# Patient Record
Sex: Female | Born: 1965
Health system: Southern US, Community
[De-identification: ages and names within clinical notes are randomized; demographics above are authoritative.]

## PROBLEM LIST (undated history)

## (undated) ENCOUNTER — Inpatient Hospital Stay: Admission: EM | Payer: Self-pay | Source: Home / Self Care

## (undated) DIAGNOSIS — M5417 Radiculopathy, lumbosacral region: Secondary | ICD-10-CM

## (undated) DIAGNOSIS — C539 Malignant neoplasm of cervix uteri, unspecified: Secondary | ICD-10-CM

## (undated) DIAGNOSIS — G8929 Other chronic pain: Secondary | ICD-10-CM

## (undated) DIAGNOSIS — M5412 Radiculopathy, cervical region: Secondary | ICD-10-CM

## (undated) DIAGNOSIS — F32A Depression, unspecified: Secondary | ICD-10-CM

## (undated) DIAGNOSIS — Z8672 Personal history of thrombophlebitis: Secondary | ICD-10-CM

## (undated) DIAGNOSIS — F419 Anxiety disorder, unspecified: Secondary | ICD-10-CM

## (undated) DIAGNOSIS — I748 Embolism and thrombosis of other arteries: Secondary | ICD-10-CM

## (undated) DIAGNOSIS — G479 Sleep disorder, unspecified: Secondary | ICD-10-CM

## (undated) DIAGNOSIS — I82409 Acute embolism and thrombosis of unspecified deep veins of unspecified lower extremity: Secondary | ICD-10-CM

## (undated) DIAGNOSIS — F329 Major depressive disorder, single episode, unspecified: Secondary | ICD-10-CM

## (undated) DIAGNOSIS — D735 Infarction of spleen: Secondary | ICD-10-CM

## (undated) DIAGNOSIS — IMO0001 Reserved for inherently not codable concepts without codable children: Secondary | ICD-10-CM

## (undated) DIAGNOSIS — R030 Elevated blood-pressure reading, without diagnosis of hypertension: Secondary | ICD-10-CM

## (undated) DIAGNOSIS — M542 Cervicalgia: Secondary | ICD-10-CM

## (undated) HISTORY — DX: Personal history of thrombophlebitis: Z86.72

## (undated) HISTORY — DX: Anxiety disorder, unspecified: F41.9

## (undated) HISTORY — DX: Infarction of spleen: D73.5

## (undated) HISTORY — DX: Sleep disorder, unspecified: G47.9

## (undated) HISTORY — DX: Radiculopathy, lumbosacral region: M54.17

## (undated) HISTORY — DX: Cervicalgia: M54.2

## (undated) HISTORY — DX: Major depressive disorder, single episode, unspecified: F32.9

## (undated) HISTORY — DX: Embolism and thrombosis of other arteries: I74.8

## (undated) HISTORY — DX: Radiculopathy, cervical region: M54.12

## (undated) HISTORY — DX: Reserved for inherently not codable concepts without codable children: IMO0001

## (undated) HISTORY — DX: Other chronic pain: G89.29

## (undated) HISTORY — DX: Depression, unspecified: F32.A

## (undated) HISTORY — DX: Elevated blood-pressure reading, without diagnosis of hypertension: R03.0

---

## 2006-04-07 ENCOUNTER — Emergency Department: Payer: Self-pay | Admitting: Emergency Medicine

## 2009-01-01 ENCOUNTER — Emergency Department: Payer: Self-pay | Admitting: Emergency Medicine

## 2009-06-12 ENCOUNTER — Emergency Department: Payer: Self-pay | Admitting: Emergency Medicine

## 2009-07-06 ENCOUNTER — Ambulatory Visit: Payer: Self-pay | Admitting: Cardiovascular Disease

## 2009-07-06 ENCOUNTER — Encounter: Payer: Self-pay | Admitting: Maternal & Fetal Medicine

## 2009-07-20 ENCOUNTER — Encounter: Payer: Self-pay | Admitting: Obstetrics & Gynecology

## 2009-07-27 ENCOUNTER — Encounter: Payer: Self-pay | Admitting: Obstetrics and Gynecology

## 2009-07-31 ENCOUNTER — Encounter: Payer: Self-pay | Admitting: Obstetrics and Gynecology

## 2009-08-25 ENCOUNTER — Observation Stay: Payer: Self-pay | Admitting: Obstetrics and Gynecology

## 2009-08-30 ENCOUNTER — Ambulatory Visit: Payer: Self-pay | Admitting: Internal Medicine

## 2009-09-01 ENCOUNTER — Observation Stay: Payer: Self-pay | Admitting: Unknown Physician Specialty

## 2009-09-14 ENCOUNTER — Encounter: Payer: Self-pay | Admitting: Maternal and Fetal Medicine

## 2009-09-19 ENCOUNTER — Ambulatory Visit: Payer: Self-pay | Admitting: Internal Medicine

## 2009-09-19 ENCOUNTER — Inpatient Hospital Stay: Payer: Self-pay | Admitting: Internal Medicine

## 2009-09-29 ENCOUNTER — Ambulatory Visit: Payer: Self-pay | Admitting: Internal Medicine

## 2009-10-09 ENCOUNTER — Ambulatory Visit: Payer: Self-pay | Admitting: Internal Medicine

## 2009-10-30 ENCOUNTER — Ambulatory Visit: Payer: Self-pay | Admitting: Internal Medicine

## 2009-11-29 ENCOUNTER — Ambulatory Visit: Payer: Self-pay | Admitting: Internal Medicine

## 2010-09-24 DIAGNOSIS — I748 Embolism and thrombosis of other arteries: Secondary | ICD-10-CM | POA: Insufficient documentation

## 2010-09-24 DIAGNOSIS — Z87828 Personal history of other (healed) physical injury and trauma: Secondary | ICD-10-CM | POA: Insufficient documentation

## 2010-09-24 DIAGNOSIS — G8929 Other chronic pain: Secondary | ICD-10-CM | POA: Insufficient documentation

## 2010-09-24 HISTORY — PX: TONSILLECTOMY AND ADENOIDECTOMY: SHX28

## 2010-09-24 HISTORY — PX: TUBAL LIGATION: SHX77

## 2011-06-03 ENCOUNTER — Ambulatory Visit: Payer: Self-pay | Admitting: Family Medicine

## 2013-03-31 ENCOUNTER — Ambulatory Visit: Payer: Self-pay | Admitting: Family Medicine

## 2013-06-11 ENCOUNTER — Ambulatory Visit: Payer: Self-pay | Admitting: Family Medicine

## 2015-06-01 ENCOUNTER — Telehealth: Payer: Self-pay | Admitting: Family Medicine

## 2015-06-01 NOTE — Telephone Encounter (Signed)
Pt is checking status on her medications. Please return call

## 2015-06-01 NOTE — Telephone Encounter (Signed)
Please let me know what patient's chart is abstracted so I may refill her medications.

## 2015-06-01 NOTE — Telephone Encounter (Signed)
Requesting refills on Phentermine, Roxicet, and if possible Cymbalta. Please send Cymbalta to [facility]

## 2015-06-01 NOTE — Telephone Encounter (Signed)
Please abstract patient chart from [employer] so I may refill requested medications.

## 2015-06-01 NOTE — Telephone Encounter (Signed)
This patient's chart has been abstracted, but is currently  unable to be seen.

## 2015-06-02 ENCOUNTER — Other Ambulatory Visit: Payer: Self-pay | Admitting: Family Medicine

## 2015-06-02 DIAGNOSIS — I10 Essential (primary) hypertension: Secondary | ICD-10-CM | POA: Insufficient documentation

## 2015-06-02 DIAGNOSIS — M549 Dorsalgia, unspecified: Secondary | ICD-10-CM

## 2015-06-02 DIAGNOSIS — E669 Obesity, unspecified: Secondary | ICD-10-CM | POA: Insufficient documentation

## 2015-06-02 DIAGNOSIS — M542 Cervicalgia: Secondary | ICD-10-CM | POA: Insufficient documentation

## 2015-06-02 DIAGNOSIS — IMO0001 Reserved for inherently not codable concepts without codable children: Secondary | ICD-10-CM

## 2015-06-02 DIAGNOSIS — F325 Major depressive disorder, single episode, in full remission: Secondary | ICD-10-CM | POA: Insufficient documentation

## 2015-06-02 DIAGNOSIS — E66811 Obesity, class 1: Secondary | ICD-10-CM | POA: Insufficient documentation

## 2015-06-02 DIAGNOSIS — Z87828 Personal history of other (healed) physical injury and trauma: Secondary | ICD-10-CM

## 2015-06-02 DIAGNOSIS — G8929 Other chronic pain: Secondary | ICD-10-CM

## 2015-06-02 DIAGNOSIS — I748 Embolism and thrombosis of other arteries: Secondary | ICD-10-CM

## 2015-06-02 MED ORDER — MORPHINE SULFATE ER 15 MG PO TBCR: 15.0000 mg | EXTENDED_RELEASE_TABLET | Freq: Two times a day (BID) | ORAL | Status: DC

## 2015-06-02 MED ORDER — OXYCODONE-ACETAMINOPHEN 5-325 MG PO TABS: 1.0000 | ORAL_TABLET | ORAL | Status: DC | PRN

## 2015-06-02 MED ORDER — PHENTERMINE HCL 37.5 MG PO CAPS: 37.5000 mg | ORAL_CAPSULE | ORAL | Status: DC

## 2015-06-02 MED ORDER — DULOXETINE HCL 60 MG PO CPEP: 60.0000 mg | ORAL_CAPSULE | Freq: Every day | ORAL | Status: DC

## 2015-06-02 NOTE — Telephone Encounter (Signed)
Per the request of Dr. Bobetta Lime, I tried to contact this patient to inform her that her rx is ready for pick up, but when I called there was no answer and I was not able to leave a message due to this patient not having a voicemail set up.

## 2015-06-02 NOTE — Telephone Encounter (Signed)
Patient called and was informed that rx was ready for pick up.

## 2015-06-15 ENCOUNTER — Telehealth: Payer: Self-pay | Admitting: Family Medicine

## 2015-06-15 NOTE — Telephone Encounter (Signed)
Contacted this patient to review the message below form Dr. Nadine Counts. Patient stated that she has tried to contact her insurance company but was told that she would have call back since their system was down. Patient was given the name of an alternative medication along with the generic name. Patient stated that she will call them then will call us back with their choice of meds

## 2015-06-15 NOTE — Telephone Encounter (Signed)
Patient states that Cymbalta is to expensive even with her insurance and wants to know what else she could take to take the place of it. [facility] [address]

## 2015-06-15 NOTE — Telephone Encounter (Signed)
Tried to contact this patient to discuss the message from Dr. Nadine Counts, but there was no answer. A message was left for this patient to give [place] a call back when she got the chance.

## 2015-06-15 NOTE — Telephone Encounter (Signed)
Pt was returning Latisha's call.  Her call back # is [phone]

## 2015-06-15 NOTE — Telephone Encounter (Signed)
Please ask patient to call her insurance company and ask what is a cheaper alternative to her current Duloxetine 60mg  DR one a day tablet. This medication is used for Depression, Fibromyalgia, Pain and the only other medication that I can think of that targets all of these symptoms is Savella (generic is Milnacipran) but it may not be as effective as Duloxetine which is a common medication. It is all dependent on her formulary and drug coverage plan which we do not have access to.

## 2015-06-16 ENCOUNTER — Telehealth: Payer: Self-pay | Admitting: Family Medicine

## 2015-06-16 NOTE — Telephone Encounter (Signed)
Requesting a return call to discuss medication. Requesting that the doctor change her cymbalta to something less expensive

## 2015-06-19 ENCOUNTER — Other Ambulatory Visit: Payer: Self-pay | Admitting: Family Medicine

## 2015-06-19 DIAGNOSIS — F325 Major depressive disorder, single episode, in full remission: Secondary | ICD-10-CM

## 2015-06-19 MED ORDER — VENLAFAXINE HCL ER 75 MG PO CP24: 75.0000 mg | ORAL_CAPSULE | Freq: Every day | ORAL | Status: DC

## 2015-06-19 NOTE — Telephone Encounter (Signed)
I have never heard of Tredraloeram & Zenlassaxine. Perhaps she means "Venlafaxine" for the "Zenlassaxine."   I will go ahead and send Venlafaxine 75mg  EXTENDED RELASE formulation one a day to her [facility] (doses come in 37.5mg , 75mg , 150mg ). If her insurance will not cover the ER formulation she will have to let me know so I can switch it to the shorter acting twice a day dosing.   This medication is good for anxiety and depression but will not help with chronic pain issues like Cymbalta does.

## 2015-06-19 NOTE — Telephone Encounter (Signed)
I tried to contact this patient to inform her that a medication was sent in to her preferred pharmacy, but there was no answer and she has not set up her voicemail so I was not able to leave a message

## 2015-06-19 NOTE — Telephone Encounter (Signed)
Patient called stating her insurance company states these (Tredraloeram & Zenlassaxine) are on the tier category for the $10 payment to substitute for Cymbalata. [facility] uses [facility] [address]

## 2015-06-20 NOTE — Telephone Encounter (Signed)
I tried to contact this patient to inform her that a rx was sent in to her preferred pharmacy, but there was no answer.  A message was left for her with stating her medication was filled and if she had any additional questions to give [place] a call.

## 2015-06-28 ENCOUNTER — Other Ambulatory Visit: Payer: Self-pay | Admitting: Family Medicine

## 2015-06-28 ENCOUNTER — Telehealth: Payer: Self-pay | Admitting: Family Medicine

## 2015-06-28 DIAGNOSIS — G8929 Other chronic pain: Secondary | ICD-10-CM

## 2015-06-28 MED ORDER — OXYCODONE-ACETAMINOPHEN 5-325 MG PO TABS: 1.0000 | ORAL_TABLET | ORAL | Status: DC | PRN

## 2015-06-28 MED ORDER — MORPHINE SULFATE ER 15 MG PO TBCR: 15.0000 mg | EXTENDED_RELEASE_TABLET | Freq: Two times a day (BID) | ORAL | Status: DC

## 2015-06-28 NOTE — Telephone Encounter (Signed)
meds were submitted to Dr. Nadine Counts for approval.

## 2015-06-28 NOTE — Telephone Encounter (Signed)
Please let patient know that her 2 pain medication re

## 2015-06-28 NOTE — Telephone Encounter (Signed)
PT HAS APPT NEXT MONTH BUT IS NEEDING HER RIOXCET AND MORPHIN EXTENDED RELEASE. WANTS TO KNOW IF SHE COULD GET THEM PRINTED BY AT LEAST Friday.

## 2015-07-27 ENCOUNTER — Telehealth: Payer: Self-pay | Admitting: Family Medicine

## 2015-07-27 DIAGNOSIS — G8929 Other chronic pain: Secondary | ICD-10-CM
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
## 2015-07-27 NOTE — Telephone Encounter (Signed)
Refill request was sent to Dr. Ashany Sundaram for approval and submission.  

## 2015-07-27 NOTE — Telephone Encounter (Signed)
Last refill of both controled pain medications was 06/30/15 so I am willing to print out new prescriptions for early early due to unusual circumstances please que up the refills and send them to me thank you.

## 2015-07-28 ENCOUNTER — Other Ambulatory Visit: Payer: Self-pay

## 2015-07-28 DIAGNOSIS — G8929 Other chronic pain: Secondary | ICD-10-CM

## 2015-07-28 MED ORDER — OXYCODONE-ACETAMINOPHEN 5-325 MG PO TABS: 1.0000 | ORAL_TABLET | ORAL | Status: DC | PRN

## 2015-07-28 MED ORDER — MORPHINE SULFATE ER 15 MG PO TBCR: 15.0000 mg | EXTENDED_RELEASE_TABLET | Freq: Two times a day (BID) | ORAL | Status: DC

## 2015-07-28 NOTE — Telephone Encounter (Signed)
Refill request was sent to Dr. Ashany Sundaram for approval and submission.  

## 2015-08-01 ENCOUNTER — Ambulatory Visit: Payer: Self-pay | Admitting: Family Medicine

## 2015-08-01 NOTE — Telephone Encounter (Signed)
ERRENOUS °

## 2015-08-18 ENCOUNTER — Ambulatory Visit (INDEPENDENT_AMBULATORY_CARE_PROVIDER_SITE_OTHER): Payer: 59 | Admitting: Family Medicine

## 2015-08-18 ENCOUNTER — Encounter: Payer: Self-pay | Admitting: Family Medicine

## 2015-08-18 VITALS — BP 132/78 | HR 92 | Temp 98.7°F | Resp 18 | Ht 63.0 in | Wt 186.0 lb

## 2015-08-18 DIAGNOSIS — M549 Dorsalgia, unspecified: Secondary | ICD-10-CM | POA: Diagnosis not present

## 2015-08-18 DIAGNOSIS — M542 Cervicalgia: Secondary | ICD-10-CM

## 2015-08-18 DIAGNOSIS — M79606 Pain in leg, unspecified: Secondary | ICD-10-CM | POA: Insufficient documentation

## 2015-08-18 DIAGNOSIS — M79605 Pain in left leg: Secondary | ICD-10-CM

## 2015-08-18 DIAGNOSIS — M79602 Pain in left arm: Secondary | ICD-10-CM

## 2015-08-18 DIAGNOSIS — E669 Obesity, unspecified: Secondary | ICD-10-CM | POA: Diagnosis not present

## 2015-08-18 DIAGNOSIS — G8929 Other chronic pain: Secondary | ICD-10-CM

## 2015-08-18 MED ORDER — MORPHINE SULFATE ER 15 MG PO TBCR: 15.0000 mg | EXTENDED_RELEASE_TABLET | Freq: Two times a day (BID) | ORAL | Status: DC

## 2015-08-18 MED ORDER — PHENTERMINE HCL 37.5 MG PO CAPS: 37.5000 mg | ORAL_CAPSULE | ORAL | Status: DC

## 2015-08-18 MED ORDER — OXYCODONE-ACETAMINOPHEN 5-325 MG PO TABS: 1.0000 | ORAL_TABLET | ORAL | Status: DC | PRN

## 2015-08-18 NOTE — Progress Notes (Signed)
Name: Jamie [name]   MRN: [id number]    DOB: 01-31-1966   Date:08/18/2015       Progress Note  Subjective  Chief Complaint  Chief Complaint  Patient presents with  . Leg Pain    patient presents with upper left leg pain that started about a week ago. patient stated it started out like a cramp. she could not even walk on it yesterday. patient did see physical therapy for her right leg pain some time back so she tried using the exercises for that.  . Medication Refill    HPI  Jamie [name] is a 49 year old female with chronic pain of multiple sites including lumbar back area, left shoulder, left arm and LUQ abdominal pain. Today she reports ome left leg anterior pain as well.  Patient complains of arthralgias and myalgias for which has been present for several years. Pain is located in multiple joints, is described as aching and throbbing, and is constant .  Associated symptoms include: decreased range of motion.  The patient has continues to be dependent on prescription opioid medications. Over the Winter months she had requested increasing Roxicet frequency to every 4 hrs but now reports she still needs this frequency due to new job function and on her feet all day. Left leg anterior pain is new for 1 week now which is described as a sharp pain with sensation of instability. Denies falling, swelling of leg, redness of leg. She continues to exercise and diet for her weight loss management. Maximum weight recorded is 230 lbs and she has not needed phentermine for 2 months now but is wondering if she can try it for another month as she has hit a wall with her weight loss again.   Patient Active Problem List   Diagnosis Date Noted  . Chronic neck and back pain 06/02/2015  . Hypertension goal BP (blood pressure) < 140/90 06/02/2015  . Major depression in remission 06/02/2015  . Obesity, Class II, BMI 35-39.9, with comorbidity 06/02/2015  . Embolism and thrombosis of splenic artery 09/24/2010  .  Chronic pain not due to malignancy 09/24/2010  . History of spleen injury 09/24/2010    Social History  Substance Use Topics  . Smoking status: Never Smoker   . Smokeless tobacco: Not on file  . Alcohol Use: No     Current outpatient prescriptions:  .  ARIPiprazole (ABILIFY) 5 MG tablet, , Disp: , Rfl:  .  DULoxetine (CYMBALTA) 60 MG capsule, Take by mouth., Disp: , Rfl:  .  morphine (MS CONTIN) 15 MG 12 hr tablet, Take 1 tablet (15 mg total) by mouth every 12 (twelve) hours., Disp: 60 tablet, Rfl: 0 .  oxyCODONE-acetaminophen (ROXICET) 5-325 MG per tablet, Take 1 tablet by mouth every 4 (four) hours as needed for severe pain., Disp: 180 tablet, Rfl: 0 .  phentermine 37.5 MG capsule, Take 1 capsule (37.5 mg total) by mouth every morning., Disp: 30 capsule, Rfl: 0  Past Surgical History  Procedure Laterality Date  . Cesarean section  09/24/2010  . Tubal ligation  09/24/2010  . Tonsillectomy and adenoidectomy  09/24/2010    History reviewed. No pertinent family history.  Allergies  Allergen Reactions  . Neuromuscular Blocking Agents   . Hydrocodone-Acetaminophen Nausea Only     Review of Systems  CONSTITUTIONAL: No significant weight changes, fever, chills, weakness or fatigue.  HEENT:  - Eyes: No visual changes.  - Ears: No auditory changes. No pain.  - Nose: No sneezing,  congestion, runny nose. - Throat: No sore throat. No changes in swallowing. SKIN: No rash or itching.  CARDIOVASCULAR: No chest pain, chest pressure or chest discomfort. No palpitations or edema.  RESPIRATORY: No shortness of breath, cough or sputum.  GASTROINTESTINAL: No anorexia, nausea, vomiting. No changes in bowel habits. Chronic LUQ abdominal pain. GENITOURINARY: No dysuria. No frequency. No discharge. NEUROLOGICAL: No headache, dizziness, syncope, paralysis, ataxia, numbness or tingling in the extremities. No memory changes. No change in bowel or bladder control.  MUSCULOSKELETAL: Chronic joint  pain. Chronic muscle pain. HEMATOLOGIC: No anemia, bleeding or bruising.  LYMPHATICS: No enlarged lymph nodes.  PSYCHIATRIC: No change in mood. No change in sleep pattern.  ENDOCRINOLOGIC: No reports of sweating, cold or heat intolerance. No polyuria or polydipsia.     Objective  BP 132/78 mmHg  Pulse 92  Temp(Src) 98.7 F (37.1 C) (Oral)  Resp 18  Ht 5\' 3"  (1.6 m)  Wt 186 lb (84.369 kg)  BMI 32.96 kg/m2  SpO2 96%  LMP 08/17/2015 (Exact Date) Body mass index is 32.96 kg/(m^2).  Physical Exam  Constitutional: Patient is obese and well-nourished. In no distress.  HEENT:  - Head: Normocephalic and atraumatic.  - Ears: Bilateral TMs gray, no erythema or effusion - Nose: Nasal mucosa moist - Mouth/Throat: Oropharynx is clear and moist. No tonsillar hypertrophy or erythema. No post nasal drainage.  - Eyes: Conjunctivae clear, EOM movements normal. PERRLA. No scleral icterus.  Neck: Normal range of motion. Neck supple. No JVD present. No thyromegaly present.  Cardiovascular: Normal rate, regular rhythm and normal heart sounds.  No murmur heard.  Pulmonary/Chest: Effort normal and breath sounds normal. No respiratory distress. Abdomen: Soft, ND, obese with normal bowel sounds in all 4 quadrants and mild tenderness LUQ.  Musculoskeletal: Normal range of motion right UE, left UE restricted ROM beyond 90 degrees flexion and abduction.  Left leg inspection no swelling, no redness, pulses in tact, no tenderness to palpation, full ROM.  Peripheral vascular: Bilateral LE no edema. Neurological: CN II-XII grossly intact with no focal deficits. Alert and oriented to person, place, and time. Coordination, balance, strength, speech and gait are normal.  Skin: Skin is warm and dry. No rash noted. No erythema.  Psychiatric: Patient has a stable mood and affect. Behavior is normal in office today. Judgment and thought content normal in office today.  Assessment & Plan  1. Obesity, Class I, BMI  30-34.9 The patient has been counseled on their higher than normal BMI.  They have verbally expressed understanding their increased risk for other diseases.  In efforts to meet a better target BMI goal the patient has been counseled on lifestyle, diet and exercise modification tactics. Start with moderate intensity aerobic exercise (walking, jogging, elliptical, swimming, group or individual sports, hiking) at least 57mins a day at least 4 days a week and increase intensity, duration, frequency as tolerated. Diet should include well balance fresh fruits and vegetables avoiding processed foods, carbohydrates and sugars. Drink at least 8oz 10 glasses a day avoiding sodas, sugary fruit drinks, sweetened tea. Check weight on a reliable scale daily and monitor weight loss progress daily. Consider investing in mobile phone apps that will help keep track of weight loss goals.  - phentermine 37.5 MG capsule; Take 1 capsule (37.5 mg total) by mouth every morning.  Dispense: 30 capsule; Refill: 0  2. Chronic neck and back pain The patient has been prescribed a controled substance today under the agreement of a filed pain treatment regimen contract.  With use of this medication they verbalize understanding the potential risk of addiction, abuse, and misuse, which can lead to overdose and death. The patient may not obtain and use other illicit or controled substances from any other sources while under the aformentioned contract. A urine drug screen will be performed periodically and the patient's name will be reviewed on the [place] Controlled Substance Reporting System regularly.  The patient expresses understanding the above statement and agreement to comply.  The patient has been counseled on the proper use, side effects and potential interactions of the new medication with other prescribed and OTC medications. Under no circumstances is this (and any other) medication to be use with alcohol or other illicit drugs. This  medication is not to be crushed, chewed, sniffed, injected or used in any other way other than what is stated in the directions. This medication is to be used at the frequency and quantity that is stated in the directions. This medication is to be used only by the individual who's name is on the prescription bottle. Drug sharing and selling is unacceptable. Patient voices understanding what has been said today.  - oxyCODONE-acetaminophen (ROXICET) 5-325 MG per tablet; Take 1 tablet by mouth every 4 (four) hours as needed for severe pain.  Dispense: 180 tablet; Refill: 0 - oxyCODONE-acetaminophen (ROXICET) 5-325 MG per tablet; Take 1 tablet by mouth every 4 (four) hours as needed for severe pain.  Dispense: 120 tablet; Refill: 0 - oxyCODONE-acetaminophen (ROXICET) 5-325 MG per tablet; Take 1 tablet by mouth every 4 (four) hours as needed for severe pain.  Dispense: 120 tablet; Refill: 0  3. Leg pain, anterior, left Continue home exercises. If it continues to bother her I will order Lumbar X-ray and Left Hip Xray. No DVT suspected at this time.  4. Chronic pain not due to malignancy Opioid dependency.   - morphine (MS CONTIN) 15 MG 12 hr tablet; Take 1 tablet (15 mg total) by mouth every 12 (twelve) hours.  Dispense: 60 tablet; Refill: 0 - morphine (MS CONTIN) 15 MG 12 hr tablet; Take 1 tablet (15 mg total) by mouth every 12 (twelve) hours.  Dispense: 60 tablet; Refill: 0 - morphine (MS CONTIN) 15 MG 12 hr tablet; Take 1 tablet (15 mg total) by mouth every 12 (twelve) hours.  Dispense: 60 tablet; Refill: 0

## 2015-09-21 ENCOUNTER — Emergency Department
Admission: EM | Admit: 2015-09-21 | Discharge: 2015-09-21 | Disposition: A | Discharge status: Home / Self Care | Payer: BLUE CROSS/BLUE SHIELD | Attending: Emergency Medicine | Admitting: Emergency Medicine

## 2015-09-21 ENCOUNTER — Encounter: Payer: Self-pay | Admitting: *Deleted

## 2015-09-21 DIAGNOSIS — I10 Essential (primary) hypertension: Secondary | ICD-10-CM | POA: Insufficient documentation

## 2015-09-21 DIAGNOSIS — X58XXXA Exposure to other specified factors, initial encounter: Secondary | ICD-10-CM | POA: Diagnosis not present

## 2015-09-21 DIAGNOSIS — Z79899 Other long term (current) drug therapy: Secondary | ICD-10-CM | POA: Insufficient documentation

## 2015-09-21 DIAGNOSIS — Z79891 Long term (current) use of opiate analgesic: Secondary | ICD-10-CM | POA: Insufficient documentation

## 2015-09-21 DIAGNOSIS — Z3202 Encounter for pregnancy test, result negative: Secondary | ICD-10-CM | POA: Insufficient documentation

## 2015-09-21 DIAGNOSIS — S301XXA Contusion of abdominal wall, initial encounter: Secondary | ICD-10-CM | POA: Diagnosis not present

## 2015-09-21 DIAGNOSIS — Y9389 Activity, other specified: Secondary | ICD-10-CM | POA: Insufficient documentation

## 2015-09-21 DIAGNOSIS — Y998 Other external cause status: Secondary | ICD-10-CM | POA: Insufficient documentation

## 2015-09-21 DIAGNOSIS — R1012 Left upper quadrant pain: Secondary | ICD-10-CM | POA: Diagnosis present

## 2015-09-21 DIAGNOSIS — Y9289 Other specified places as the place of occurrence of the external cause: Secondary | ICD-10-CM | POA: Insufficient documentation

## 2015-09-21 LAB — URINALYSIS COMPLETE WITH MICROSCOPIC (ARMC ONLY)
Bilirubin Urine: NEGATIVE
Glucose, UA: NEGATIVE mg/dL
Hgb urine dipstick: NEGATIVE
Ketones, ur: NEGATIVE mg/dL
LEUKOCYTES UA: NEGATIVE
Nitrite: NEGATIVE
PH: 6 (ref 5.0–8.0)
PROTEIN: NEGATIVE mg/dL
Specific Gravity, Urine: 1.018 (ref 1.005–1.030)

## 2015-09-21 LAB — CBC WITH DIFFERENTIAL/PLATELET
BASOS ABS: 0.1 10*3/uL (ref 0–0.1)
Basophils Relative: 1 %
EOS ABS: 0.1 10*3/uL (ref 0–0.7)
EOS PCT: 1 %
HCT: 36.5 % (ref 35.0–47.0)
HEMOGLOBIN: 12.2 g/dL (ref 12.0–16.0)
LYMPHS ABS: 3 10*3/uL (ref 1.0–3.6)
LYMPHS PCT: 32 %
MCH: 30.2 pg (ref 26.0–34.0)
MCHC: 33.4 g/dL (ref 32.0–36.0)
MCV: 90.4 fL (ref 80.0–100.0)
Monocytes Absolute: 0.7 10*3/uL (ref 0.2–0.9)
Monocytes Relative: 8 %
NEUTROS PCT: 58 %
Neutro Abs: 5.6 10*3/uL (ref 1.4–6.5)
PLATELETS: 370 10*3/uL (ref 150–440)
RBC: 4.03 MIL/uL (ref 3.80–5.20)
RDW: 13.3 % (ref 11.5–14.5)
WBC: 9.5 10*3/uL (ref 3.6–11.0)

## 2015-09-21 LAB — COMPREHENSIVE METABOLIC PANEL
ALBUMIN: 3.9 g/dL (ref 3.5–5.0)
ALT: 13 U/L — AB (ref 14–54)
AST: 21 U/L (ref 15–41)
Alkaline Phosphatase: 67 U/L (ref 38–126)
Anion gap: 6 (ref 5–15)
BUN: 17 mg/dL (ref 6–20)
CHLORIDE: 105 mmol/L (ref 101–111)
CO2: 26 mmol/L (ref 22–32)
CREATININE: 0.68 mg/dL (ref 0.44–1.00)
Calcium: 8.4 mg/dL — ABNORMAL LOW (ref 8.9–10.3)
GFR calc Af Amer: >60 mL/min (ref >60)
GLUCOSE: 87 mg/dL (ref 65–99)
POTASSIUM: 3.4 mmol/L — AB (ref 3.5–5.1)
Sodium: 137 mmol/L (ref 135–145)
Total Bilirubin: 0.7 mg/dL (ref 0.3–1.2)
Total Protein: 7.5 g/dL (ref 6.5–8.1)

## 2015-09-21 LAB — POCT PREGNANCY, URINE: PREG TEST UR: NEGATIVE

## 2015-09-21 LAB — LIPASE, BLOOD: Lipase: 41 U/L (ref 22–51)

## 2015-09-21 NOTE — ED Provider Notes (Signed)
[facility] Emergency Department Provider Note  ____________________________________________  Time seen: Approximately 6:07 PM  I have reviewed the triage vital signs and the nursing notes.   HISTORY  Chief Complaint Abdominal Pain    HPI Jamie [name] is a 49 y.o. female who presents to the ED with a complaint of LUQ pain. She was originally diagnosed 6 years prior with an infarcted spleen s/p a blood clot. She is on daily pain medications which control symptoms and have remained the same over the intervening period since diagnosis. She states she was playing with her son 3 days ago when he landed on her abd. She intially did not think anything of it, but has had increasing LUQ pain since then. Pain is still in the same location as normal symptoms but has increasingly worsened.  She endorse some nausea and vomiting but denies any diarrhea or constipation. No other symptoms or complaints. Pain is constant, moderate to severe, and nothing improves symptoms.    Past Medical History  Diagnosis Date  . Infarction of spleen   . Obesity, Class II, BMI 35-39.9, with comorbidity   . Depressive disorder     followed by Dr. Alethia Berthold.  . Disturbance, sleep   . Chronic pain not due to malignancy   . Embolism and thrombosis of splenic artery   . Brachial neuritis   . Lumbosacral neuritis   . Chronic neck pain   . Elevated blood pressure reading without diagnosis of hypertension   . H/O thrombophlebitis     Patient Active Problem List   Diagnosis Date Noted  . Leg pain, anterior 08/18/2015  . Chronic neck and back pain 06/02/2015  . Hypertension goal BP (blood pressure) < 140/90 06/02/2015  . Major depression in remission 06/02/2015  . Obesity, Class I, BMI 30-34.9 06/02/2015  . Embolism and thrombosis of splenic artery 09/24/2010  . Chronic pain not due to malignancy 09/24/2010  . History of spleen injury 09/24/2010    Past Surgical History  Procedure  Laterality Date  . Cesarean section  09/24/2010  . Tubal ligation  09/24/2010  . Tonsillectomy and adenoidectomy  09/24/2010    Current Outpatient Rx  Name  Route  Sig  Dispense  Refill  . ARIPiprazole (ABILIFY) 5 MG tablet               . DULoxetine (CYMBALTA) 60 MG capsule   Oral   Take by mouth.         . morphine (MS CONTIN) 15 MG 12 hr tablet   Oral   Take 1 tablet (15 mg total) by mouth every 12 (twelve) hours.   60 tablet   0     Refill 08/27/15   . morphine (MS CONTIN) 15 MG 12 hr tablet   Oral   Take 1 tablet (15 mg total) by mouth every 12 (twelve) hours.   60 tablet   0     Refill 09/27/15   . morphine (MS CONTIN) 15 MG 12 hr tablet   Oral   Take 1 tablet (15 mg total) by mouth every 12 (twelve) hours.   60 tablet   0     Refill 10/27/15   . oxyCODONE-acetaminophen (ROXICET) 5-325 MG per tablet   Oral   Take 1 tablet by mouth every 4 (four) hours as needed for severe pain.   180 tablet   0     Refill 08/27/15   . oxyCODONE-acetaminophen (ROXICET) 5-325 MG per tablet  Oral   Take 1 tablet by mouth every 4 (four) hours as needed for severe pain.   120 tablet   0     Refill 09/27/15   . oxyCODONE-acetaminophen (ROXICET) 5-325 MG per tablet   Oral   Take 1 tablet by mouth every 4 (four) hours as needed for severe pain.   120 tablet   0     Refill 10/27/15   . phentermine 37.5 MG capsule   Oral   Take 1 capsule (37.5 mg total) by mouth every morning.   30 capsule   0     Refill 08/18/15     Allergies Neuromuscular blocking agents and Hydrocodone-acetaminophen  No family history on file.  Social History Social History  Substance Use Topics  . Smoking status: Never Smoker   . Smokeless tobacco: None  . Alcohol Use: No    Review of Systems Constitutional: No fever/chills Eyes: No visual changes. ENT: No sore throat. Cardiovascular: Denies chest pain. Respiratory: Denies shortness of breath. Gastrointestinal: Pos for LUQ  abdominal pain.  Pos for nausea, no vomiting.  No diarrhea.  No constipation. Genitourinary: Negative for dysuria. Musculoskeletal: Negative for back pain. Skin: Negative for rash. Neurological: Negative for headaches, focal weakness or numbness.  10-point ROS otherwise negative.  ____________________________________________   PHYSICAL EXAM:  VITAL SIGNS: ED Triage Vitals  Enc Vitals Group     BP 09/21/15 1644 151/94 mmHg     Pulse Rate 09/21/15 1644 85     Resp 09/21/15 1644 16     Temp 09/21/15 1644 97.9 F (36.6 C)     Temp Source 09/21/15 1644 Oral     SpO2 09/21/15 1644 99 %     Weight 09/21/15 1644 170 lb (77.111 kg)     Height 09/21/15 1644 5\' 2"  (1.575 m)     Head Cir --      Peak Flow --      Pain Score 09/21/15 1641 10     Pain Loc --      Pain Edu? --      Excl. in [place]? --     Constitutional: Alert and oriented. Well appearing and in no acute distress. Eyes: Conjunctivae are normal. PERRL. EOMI. Head: Atraumatic. Nose: No congestion/rhinnorhea. Mouth/Throat: Mucous membranes are moist.  Oropharynx non-erythematous. Neck: No stridor.   Cardiovascular: Normal rate, regular rhythm. Grossly normal heart sounds.  Good peripheral circulation. Respiratory: Normal respiratory effort.  No retractions. Lungs CTAB. Gastrointestinal: Soft to palpation. TTP over LUQ to light and deep palpation. No splenic enlargement palpated. No distention or guarding. No abdominal bruits. No CVA tenderness. Musculoskeletal: No lower extremity tenderness nor edema.  No joint effusions. Neurologic:  Normal speech and language. No gross focal neurologic deficits are appreciated. No gait instability. Skin:  Skin is warm, dry and intact. No rash noted. Psychiatric: Mood and affect are normal. Speech and behavior are normal.  ____________________________________________   LABS (all labs ordered are listed, but only abnormal results are displayed)  Labs Reviewed  COMPREHENSIVE METABOLIC  PANEL - Abnormal; Notable for the following:    Potassium 3.4 (*)    Calcium 8.4 (*)    ALT 13 (*)    All other components within normal limits  URINALYSIS COMPLETEWITH MICROSCOPIC ([facility] ONLY) - Abnormal; Notable for the following:    Color, Urine YELLOW (*)    APPearance HAZY (*)    Bacteria, UA RARE (*)    Squamous Epithelial / LPF 0-5 (*)    All other  components within normal limits  CBC WITH DIFFERENTIAL/PLATELET  LIPASE, BLOOD  POC URINE PREG, ED  POCT PREGNANCY, URINE   ____________________________________________  EKG   ____________________________________________  RADIOLOGY   ____________________________________________   PROCEDURES  Procedure(s) performed: None  Critical Care performed: No  ____________________________________________   INITIAL IMPRESSION / ASSESSMENT AND PLAN / ED COURSE  Pertinent labs & imaging results that were available during my care of the patient were reviewed by me and considered in my medical decision making (see chart for details).  Discussed the patients HX, symptoms, exam with patient. Discussed options of CT imaging of abd/pel for further evaluation. Discussed that labs revealed no signs of infection or gross blood loss. No hematoma/contusion visible to exterior abd wall. Tenderness to palpation but no other concerning exam findings. Patient opted to avoid imaging at this time and I agree with course. The patient was given strict ED precautions to return if symptoms worsened or she developed unexplained symptoms. Advised patient to take NSAID's in addition to chronic pain medication. If NSAID's alone did not improve symptoms advised the patient to increased daily pain medications for a few  Days and then return to regular dosing. She advised understanding and states compliance with same. ____________________________________________   FINAL CLINICAL IMPRESSION(S) / ED DIAGNOSES  Final diagnoses:  Abdominal wall contusion,  initial encounter      Darletta Moll, PA-C 09/21/15 1857  Lisa Roca, MD 09/21/15 2148

## 2015-09-21 NOTE — Discharge Instructions (Signed)
Blunt Abdominal Trauma A blunt injury to the abdomen can cause pain. The pain is most likely from bruising and stretching of your muscles. This pain is often made worse with movement. Most often these injuries are not serious and get better within 1 week with rest and mild pain medicine. However, internal organs (liver, spleen, kidneys) can be injured with blunt trauma. If you do not get better or if you get worse, further examination may be needed. Continue with your regular daily activities, but avoid any strenuous activities until your pain is improved. If your stomach is upset, stick to a clear liquid diet and slowly advance to solid food.  SEEK IMMEDIATE MEDICAL CARE IF:   You develop increasing pain, nausea, or repeated vomiting.  You develop chest pain or breathing difficulty.  You develop blood in the urine, vomit, or stool.  You develop weakness, fainting, fever, or other serious complaints. Document Released: 01/23/2005 Document Revised: 03/09/2012 Document Reviewed: 05/11/2009 [facility] Patient Information 2015 [place], [place]. This information is not intended to replace advice given to you by your health care provider. Make sure you discuss any questions you have with your health care provider.   You may take an anti-inflammatory for additional symptom relief. You may also increase your daily dose of narcotic pain medication for a few days until symptoms resolve, then return to your baseline dosing.

## 2015-09-21 NOTE — ED Notes (Signed)
Pt reports LUQ x 5 years but over past few days, pain significantly worse. Nausea.

## 2015-09-26 ENCOUNTER — Telehealth: Payer: Self-pay

## 2015-09-26 ENCOUNTER — Ambulatory Visit (INDEPENDENT_AMBULATORY_CARE_PROVIDER_SITE_OTHER): Payer: BLUE CROSS/BLUE SHIELD | Admitting: Family Medicine

## 2015-09-26 ENCOUNTER — Encounter: Payer: Self-pay | Admitting: Family Medicine

## 2015-09-26 VITALS — BP 128/88 | HR 97 | Temp 98.6°F | Resp 16 | Wt 180.6 lb

## 2015-09-26 DIAGNOSIS — Z23 Encounter for immunization: Secondary | ICD-10-CM | POA: Diagnosis not present

## 2015-09-26 DIAGNOSIS — R1012 Left upper quadrant pain: Secondary | ICD-10-CM | POA: Diagnosis not present

## 2015-09-26 DIAGNOSIS — G8929 Other chronic pain: Secondary | ICD-10-CM

## 2015-09-26 DIAGNOSIS — M542 Cervicalgia: Secondary | ICD-10-CM

## 2015-09-26 DIAGNOSIS — M549 Dorsalgia, unspecified: Secondary | ICD-10-CM | POA: Diagnosis not present

## 2015-09-26 DIAGNOSIS — I748 Embolism and thrombosis of other arteries: Secondary | ICD-10-CM

## 2015-09-26 DIAGNOSIS — F1120 Opioid dependence, uncomplicated: Secondary | ICD-10-CM | POA: Diagnosis not present

## 2015-09-26 NOTE — Telephone Encounter (Signed)
Dr. Nadine Counts spoke with this patient's pharmacists.

## 2015-09-26 NOTE — Progress Notes (Signed)
Name: Jamie [name]   MRN: [id number]    DOB: 1966-08-03   Date:09/26/2015       Progress Note  Subjective  Chief Complaint  Chief Complaint  Patient presents with  . Follow-up    patient was recently in the ER at [facility] and was told to double up on her pain medication so now she is out and need a refill.    HPI  Jamie [name] is a 49 year old female with chronic pain of multiple sites including lumbar back area, left shoulder, left arm and LUQ abdominal pain (per patient she is convinced her history of splenic artery thombosis contributes to her pain). Today she reports ome left leg anterior pain as well. Patient complains of arthralgias and myalgias for which has been present for several years. Pain is located in multiple joints, is described as aching and throbbing, and is constant . Associated symptoms include: decreased range of motion. The patient has continues to be dependent on prescription opioid medications. Over the Winter months she had requested increasing Roxicet frequency to every 4 hrs but now reports she still needs this frequency due to new job function and on her feet all day. On 09/21/15 she was seen in the ER after having a fall 3 days prior to that while playing with her son. She landed on her abdomen and noted some increased pain more than usual. Lab work and clinical picture was essentially normal at the ER so she opted to not proceed with any further imaging. She did go home and use more Roxicet pain medication and has run out early. Today she reports still feeling mildly sick to her stomach but overall stable since incident and slowly improving. She denies dizziness, worsening abdominal pain, nausea, vomiting, shortness of breath, dyspnea.    Active Ambulatory Problems    Diagnosis Date Noted  . Embolism and thrombosis of splenic artery 09/24/2010  . Chronic pain not due to malignancy 09/24/2010  . Chronic neck and back pain 06/02/2015  . Hypertension goal BP (blood  pressure) < 140/90 06/02/2015  . Major depression in remission 06/02/2015  . History of spleen injury 09/24/2010  . Obesity, Class I, BMI 30-34.9 06/02/2015  . Leg pain, anterior 08/18/2015   Resolved Ambulatory Problems    Diagnosis Date Noted  . No Resolved Ambulatory Problems   Past Medical History  Diagnosis Date  . Infarction of spleen   . Obesity, Class II, BMI 35-39.9, with comorbidity   . Depressive disorder   . Disturbance, sleep   . Brachial neuritis   . Lumbosacral neuritis   . Chronic neck pain   . Elevated blood pressure reading without diagnosis of hypertension   . H/O thrombophlebitis     Social History  Substance Use Topics  . Smoking status: Never Smoker   . Smokeless tobacco: Not on file  . Alcohol Use: No     Current outpatient prescriptions:  .  DULoxetine (CYMBALTA) 60 MG capsule, Take by mouth., Disp: , Rfl:  .  morphine (MS CONTIN) 15 MG 12 hr tablet, Take 1 tablet (15 mg total) by mouth every 12 (twelve) hours., Disp: 60 tablet, Rfl: 0 .  oxyCODONE-acetaminophen (ROXICET) 5-325 MG per tablet, Take 1 tablet by mouth every 4 (four) hours as needed for severe pain., Disp: 180 tablet, Rfl: 0 .  phentermine 37.5 MG capsule, Take 1 capsule (37.5 mg total) by mouth every morning., Disp: 30 capsule, Rfl: 0  Past Surgical History  Procedure Laterality Date  .  Cesarean section  09/24/2010  . Tubal ligation  09/24/2010  . Tonsillectomy and adenoidectomy  09/24/2010    No family history on file.  Allergies  Allergen Reactions  . Neuromuscular Blocking Agents   . Hydrocodone-Acetaminophen Nausea Only     Review of Systems  CONSTITUTIONAL: No significant weight changes, fever, chills, weakness or fatigue.  CARDIOVASCULAR: No chest pain, chest pressure or chest discomfort. No palpitations or edema.  RESPIRATORY: No shortness of breath, cough or sputum.  GASTROINTESTINAL: No anorexia, nausea, vomiting. No changes in bowel habits. Chronic LUQ  abdominal pain. GENITOURINARY: No dysuria. No frequency. No discharge. NEUROLOGICAL: No headache, dizziness, syncope, paralysis, ataxia, numbness or tingling in the extremities. No memory changes. No change in bowel or bladder control.  MUSCULOSKELETAL: Chronic joint pain. Chronic muscle pain. HEMATOLOGIC: No anemia, bleeding or bruising.  LYMPHATICS: No enlarged lymph nodes.  PSYCHIATRIC: No change in mood. No change in sleep pattern.  ENDOCRINOLOGIC: No reports of sweating, cold or heat intolerance. No polyuria or polydipsia.     Objective  BP 128/88 mmHg  Pulse 97  Temp(Src) 98.6 F (37 C) (Oral)  Resp 16  Wt 180 lb 9.6 oz (81.92 kg)  SpO2 98%  LMP 09/17/2015 Body mass index is 33.02 kg/(m^2).  Physical Exam  Constitutional: Patient is obese and well-nourished. In no distress.  HEENT:  - Head: Normocephalic and atraumatic.  - Ears: Bilateral TMs gray, no erythema or effusion - Nose: Nasal mucosa moist - Mouth/Throat: Oropharynx is clear and moist. No tonsillar hypertrophy or erythema. No post nasal drainage.  - Eyes: Conjunctivae clear, EOM movements normal. PERRLA. No scleral icterus.  Neck: Normal range of motion. Neck supple. No JVD present. No thyromegaly present.  Cardiovascular: Normal rate, regular rhythm and normal heart sounds. No murmur heard.  Pulmonary/Chest: Effort normal and breath sounds normal. No respiratory distress. Abdomen: Soft, ND, obese with normal bowel sounds in all 4 quadrants and mild tenderness LUQ.  Musculoskeletal: Normal range of motion right UE, left UE restricted ROM beyond 90 degrees flexion and abduction. Left leg inspection no swelling, no redness, pulses in tact, no tenderness to palpation, full ROM.  Neurological: CN II-XII grossly intact with no focal deficits. Alert and oriented to person, place, and time. Coordination, balance, strength, speech and gait are normal.  Psychiatric: Patient has a stable mood.    Recent Results  (from the past 2160 hour(s))  CBC WITH DIFFERENTIAL     Status: None   Collection Time: 09/21/15  4:52 PM  Result Value Ref Range   WBC 9.5 3.6 - 11.0 K/uL   RBC 4.03 3.80 - 5.20 MIL/uL   Hemoglobin 12.2 12.0 - 16.0 g/dL   HCT 36.5 35.0 - 47.0 %   MCV 90.4 80.0 - 100.0 fL   MCH 30.2 26.0 - 34.0 pg   MCHC 33.4 32.0 - 36.0 g/dL   RDW 13.3 11.5 - 14.5 %   Platelets 370 150 - 440 K/uL   Neutrophils Relative % 58 %   Neutro Abs 5.6 1.4 - 6.5 K/uL   Lymphocytes Relative 32 %   Lymphs Abs 3.0 1.0 - 3.6 K/uL   Monocytes Relative 8 %   Monocytes Absolute 0.7 0.2 - 0.9 K/uL   Eosinophils Relative 1 %   Eosinophils Absolute 0.1 0 - 0.7 K/uL   Basophils Relative 1 %   Basophils Absolute 0.1 0 - 0.1 K/uL  Comprehensive metabolic panel     Status: Abnormal   Collection Time: 09/21/15  4:52 PM  Result Value Ref Range   Sodium 137 135 - 145 mmol/L   Potassium 3.4 (L) 3.5 - 5.1 mmol/L   Chloride 105 101 - 111 mmol/L   CO2 26 22 - 32 mmol/L   Glucose, Bld 87 65 - 99 mg/dL   BUN 17 6 - 20 mg/dL   Creatinine, Ser 0.68 0.44 - 1.00 mg/dL   Calcium 8.4 (L) 8.9 - 10.3 mg/dL   Total Protein 7.5 6.5 - 8.1 g/dL   Albumin 3.9 3.5 - 5.0 g/dL   AST 21 15 - 41 U/L   ALT 13 (L) 14 - 54 U/L   Alkaline Phosphatase 67 38 - 126 U/L   Total Bilirubin 0.7 0.3 - 1.2 mg/dL   GFR calc non Af Amer >60 >60 mL/min   GFR calc Af Amer >60 >60 mL/min    Comment: (NOTE) The eGFR has been calculated using the CKD EPI equation. This calculation has not been validated in all clinical situations. eGFR's persistently <60 mL/min signify possible Chronic Kidney Disease.    Anion gap 6 5 - 15  Lipase, blood     Status: None   Collection Time: 09/21/15  4:52 PM  Result Value Ref Range   Lipase 41 22 - 51 U/L  Urinalysis complete, with microscopic ([facility] only)     Status: Abnormal   Collection Time: 09/21/15  4:52 PM  Result Value Ref Range   Color, Urine YELLOW (A) YELLOW   APPearance HAZY (A) CLEAR   Glucose, UA  NEGATIVE NEGATIVE mg/dL   Bilirubin Urine NEGATIVE NEGATIVE   Ketones, ur NEGATIVE NEGATIVE mg/dL   Specific Gravity, Urine 1.018 1.005 - 1.030   Hgb urine dipstick NEGATIVE NEGATIVE   pH 6.0 5.0 - 8.0   Protein, ur NEGATIVE NEGATIVE mg/dL   Nitrite NEGATIVE NEGATIVE   Leukocytes, UA NEGATIVE NEGATIVE   RBC / HPF 0-5 0 - 5 RBC/hpf   WBC, UA 0-5 0 - 5 WBC/hpf   Bacteria, UA RARE (A) NONE SEEN   Squamous Epithelial / LPF 0-5 (A) NONE SEEN  Pregnancy, urine POC     Status: None   Collection Time: 09/21/15  5:10 PM  Result Value Ref Range   Preg Test, Ur NEGATIVE NEGATIVE    Comment:        THE SENSITIVITY OF THIS METHODOLOGY IS >24 mIU/mL      Assessment & Plan  1. Abdominal pain, chronic, left upper quadrant Reviewed hospital notes and lab work independently. Clinically Farheen remains stable but again I have urged her to seek ED consultation if symptoms worsen due to her history of splenic artery thrombosis.   2. Chronic neck and back pain Currently she is using Roxicet 5-325 mg one every 4 hrs and I want her to go back to every 6hrs use. Continue with MS Contin 15 mg po q12hrs. At our last visit I had already printed out Roxicet for #120 which I have called [facility] and authorized early refill today.  3. Embolism and thrombosis of splenic artery Clinically stable.   4. Opioid dependence with physiological dependence Today I had a very straight forward conversation with Brindle where I expressed to her my concern that she likely has opioid dependency and over the past 2 years has needed more quantity and more recently increased strength of opioid pain medication without any physical change in the source of her pain. I have asked her to consider what her long term goal is in regards to pain management and  dependency on pain medications. Today I have outlined a plan to reduce her pain medication usage. We will start with decreased frequency of use. Currently she is using Roxicet  5-325 mg one every 4 hrs and I want her to go back to every 6hrs use. Continue with MS Contin 15 mg po q12hrs. At our last visit I had already printed out Roxicet for #120 which I have called [facility] and authorized early refill today. Jeanie seems appropriately concerned about her pain medication dependency but also is nervous about reducing quantity. I will have her follow up closely.   5. Need for immunization against influenza  - Flu Vaccine Quad 36+ mos IM

## 2015-09-26 NOTE — Telephone Encounter (Signed)
Patient stated that the pharmacists told her they did not understand your message this morning so they called but they were told that we were busy. I told her that i have not had any calls from her pharmacy this morning, but that I would relay this message so that there would be clarity on the quantity.  Dr.Sundaram called this patient's pharmacist.

## 2015-10-24 ENCOUNTER — Ambulatory Visit: Payer: BLUE CROSS/BLUE SHIELD | Admitting: Family Medicine

## 2015-11-14 ENCOUNTER — Encounter: Payer: Self-pay | Admitting: Family Medicine

## 2015-11-14 ENCOUNTER — Ambulatory Visit (INDEPENDENT_AMBULATORY_CARE_PROVIDER_SITE_OTHER): Payer: BLUE CROSS/BLUE SHIELD | Admitting: Family Medicine

## 2015-11-14 VITALS — BP 130/80 | HR 101 | Temp 98.3°F | Resp 16 | Wt 181.1 lb

## 2015-11-14 DIAGNOSIS — M542 Cervicalgia: Secondary | ICD-10-CM

## 2015-11-14 DIAGNOSIS — E669 Obesity, unspecified: Secondary | ICD-10-CM

## 2015-11-14 DIAGNOSIS — G8929 Other chronic pain: Secondary | ICD-10-CM | POA: Diagnosis not present

## 2015-11-14 DIAGNOSIS — M549 Dorsalgia, unspecified: Secondary | ICD-10-CM | POA: Diagnosis not present

## 2015-11-14 MED ORDER — OXYCODONE HCL ER 10 MG PO T12A: 10.0000 mg | EXTENDED_RELEASE_TABLET | Freq: Two times a day (BID) | ORAL | Status: DC

## 2015-11-14 MED ORDER — PHENTERMINE HCL 37.5 MG PO CAPS: 37.5000 mg | ORAL_CAPSULE | ORAL | Status: DC

## 2015-11-14 MED ORDER — OXYCODONE-ACETAMINOPHEN 5-325 MG PO TABS: 1.0000 | ORAL_TABLET | Freq: Four times a day (QID) | ORAL | Status: DC | PRN

## 2015-11-14 NOTE — Progress Notes (Signed)
Name: Jamie [name]   MRN: [id number]    DOB: 12/08/1966   Date:11/14/2015       Progress Note  Subjective  Chief Complaint  Chief Complaint  Patient presents with  . Follow-up    Medication refill for all medications    HPI  Jamie [name] is a 49 year old female with chronic pain of multiple sites including lumbar back area, left shoulder, left arm and LUQ abdominal pain (per patient she is convinced her history of splenic artery thombosis contributes to her pain). At our last visit I had expressed to [facility] that in the time that she has been under my care her pain needs have increased, evidenced by increased dosage and frequency of narcotic medication. Due to this I had encouraged her to implement a plan of weaning down on her pain medication needs. Patient usually complains of arthralgias and myalgias for which has been present for several years. Pain is located in multiple joints, is described as aching and throbbing, and is constant . Associated symptoms include: decreased range of motion. The patient has continues to be dependent on prescription opioid medications. Over the Winter months in 2015 she had requested increasing Roxicet frequency to every 4 hrs but failed to wean down back to every 6 hrs usage and now this Winter in 2016 she is having more pain. She feels this may be due to having to be on Ms Contin when her insurance changed and she had to stop Oxycontin 10 mg po q12hrs in addition to her Roxicet. She is willing to try and go back on Oxycontin 10 mg as she has regained better insurance coverage.   Past Medical History  Diagnosis Date  . Infarction of spleen   . Obesity, Class II, BMI 35-39.9, with comorbidity ([place])   . Depressive disorder     followed by Dr. Alethia Berthold.  . Disturbance, sleep   . Chronic pain not due to malignancy   . Embolism and thrombosis of splenic artery   . Brachial neuritis   . Lumbosacral neuritis   . Chronic neck pain   . Elevated blood  pressure reading without diagnosis of hypertension   . H/O thrombophlebitis     Patient Active Problem List   Diagnosis Date Noted  . Abdominal pain, chronic, left upper quadrant 09/26/2015  . Opioid dependence with physiological dependence ([place]) 09/26/2015  . Need for immunization against influenza 09/26/2015  . Leg pain, anterior 08/18/2015  . Chronic neck and back pain 06/02/2015  . Hypertension goal BP (blood pressure) < 140/90 06/02/2015  . Major depression in remission ([place]) 06/02/2015  . Obesity, Class I, BMI 30-34.9 06/02/2015  . Embolism and thrombosis of splenic artery 09/24/2010  . Chronic pain not due to malignancy 09/24/2010  . History of spleen injury 09/24/2010    Social History  Substance Use Topics  . Smoking status: Never Smoker   . Smokeless tobacco: Not on file  . Alcohol Use: No     Current outpatient prescriptions:  .  DULoxetine (CYMBALTA) 60 MG capsule, Take by mouth., Disp: , Rfl:  .  morphine (MS CONTIN) 15 MG 12 hr tablet, Take 1 tablet (15 mg total) by mouth every 12 (twelve) hours., Disp: 60 tablet, Rfl: 0 .  oxyCODONE-acetaminophen (ROXICET) 5-325 MG per tablet, Take 1 tablet by mouth every 4 (four) hours as needed for severe pain., Disp: 180 tablet, Rfl: 0 .  phentermine 37.5 MG capsule, Take 1 capsule (37.5 mg total) by mouth every  morning., Disp: 30 capsule, Rfl: 0  Past Surgical History  Procedure Laterality Date  . Cesarean section  09/24/2010  . Tubal ligation  09/24/2010  . Tonsillectomy and adenoidectomy  09/24/2010    No family history on file.  Allergies  Allergen Reactions  . Neuromuscular Blocking Agents   . Hydrocodone-Acetaminophen Nausea Only     Review of Systems  CONSTITUTIONAL: No significant weight changes, fever, chills, weakness or fatigue.  CARDIOVASCULAR: No chest pain, chest pressure or chest discomfort. No palpitations or edema.  RESPIRATORY: No shortness of breath, cough or sputum.  GASTROINTESTINAL: No  anorexia, nausea, vomiting. No changes in bowel habits. Chronic LUQ abdominal pain. GENITOURINARY: No dysuria. No frequency. No discharge. NEUROLOGICAL: No headache, dizziness, syncope, paralysis, ataxia, numbness or tingling in the extremities. No memory changes. No change in bowel or bladder control.  MUSCULOSKELETAL: Chronic joint pain. Chronic muscle pain. HEMATOLOGIC: No anemia, bleeding or bruising.  LYMPHATICS: No enlarged lymph nodes.  PSYCHIATRIC: Yes change in mood. No change in sleep pattern.  ENDOCRINOLOGIC: No reports of sweating, cold or heat intolerance. No polyuria or polydipsia.    Objective  BP 130/80 mmHg  Pulse 101  Temp(Src) 98.3 F (36.8 C) (Oral)  Resp 16  Wt 181 lb 1.6 oz (82.146 kg)  SpO2 98%  LMP 11/07/2015 (Exact Date) Body mass index is 33.12 kg/(m^2).  Physical Exam  Constitutional: Patient is obese and well-nourished. In no distress. .  Cardiovascular: Normal rate, regular rhythm and normal heart sounds. No murmur heard.  Pulmonary/Chest: Effort normal and breath sounds normal. No respiratory distress. Abdomen: Soft, ND, obese with normal bowel sounds in all 4 quadrants and mild tenderness LUQ.  Musculoskeletal: Normal range of motion right UE, left UE restricted ROM beyond 90 degrees flexion and abduction. Left leg inspection no swelling, no redness, pulses in tact, no tenderness to palpation, full ROM.  Neurological: CN II-XII grossly intact with no focal deficits. Alert and oriented to person, place, and time. Coordination, balance, strength, speech and gait are normal.  Psychiatric: Patient has a tearful mood.     Assessment & Plan  1. Chronic pain not due to malignancy DC MS Contin 15 mg po bid and restart Oxycontin 10 mg po q12 hrs and continue using Roxicet 5-325 mg po q 6-8hrs.   - oxyCODONE (OXYCONTIN) 10 mg 12 hr tablet; Take 1 tablet (10 mg total) by mouth every 12 (twelve) hours.  Dispense: 60 tablet; Refill: 0  2. Chronic  neck and back pain DC MS Contin.  - oxyCODONE (OXYCONTIN) 10 mg 12 hr tablet; Take 1 tablet (10 mg total) by mouth every 12 (twelve) hours.  Dispense: 60 tablet; Refill: 0 - oxyCODONE-acetaminophen (ROXICET) 5-325 MG tablet; Take 1 tablet by mouth every 6 (six) hours as needed for severe pain.  Dispense: 120 tablet; Refill: 0  3. Obesity, Class I, BMI 30-34.9 Weight stable, she is requesting another trial of Phentermine to get to better BMI goal.  - phentermine 37.5 MG capsule; Take 1 capsule (37.5 mg total) by mouth every morning.  Dispense: 30 capsule; Refill: 0

## 2015-11-17 ENCOUNTER — Ambulatory Visit: Payer: 59 | Admitting: Family Medicine

## 2015-12-05 ENCOUNTER — Other Ambulatory Visit: Payer: Self-pay

## 2015-12-05 NOTE — Telephone Encounter (Signed)
Patient stated that the brand Percocet works better than generic Roxicet and wants to know if she can switch.  Refill request was sent to Dr. Bobetta Lime for approval and submission.

## 2015-12-11 ENCOUNTER — Other Ambulatory Visit: Payer: Self-pay | Admitting: Family Medicine

## 2015-12-11 DIAGNOSIS — G8929 Other chronic pain: Secondary | ICD-10-CM

## 2015-12-11 DIAGNOSIS — M549 Dorsalgia, unspecified: Secondary | ICD-10-CM

## 2015-12-11 DIAGNOSIS — M542 Cervicalgia: Secondary | ICD-10-CM

## 2015-12-11 MED ORDER — OXYCODONE HCL ER 10 MG PO T12A: 10.0000 mg | EXTENDED_RELEASE_TABLET | Freq: Two times a day (BID) | ORAL | Status: DC

## 2015-12-11 MED ORDER — PERCOCET 5-325 MG PO TABS: 1.0000 | ORAL_TABLET | Freq: Four times a day (QID) | ORAL | Status: DC | PRN

## 2015-12-11 NOTE — Telephone Encounter (Signed)
Pt came by to get her Percocet 5-325 MG tab and states that she needs a refill on  Extended release Oxycotin 10 mg. Pt understands it is not due to be filled until 12/14/15.

## 2016-01-09 ENCOUNTER — Other Ambulatory Visit: Payer: Self-pay

## 2016-01-09 ENCOUNTER — Telehealth: Payer: Self-pay | Admitting: Family Medicine

## 2016-01-09 DIAGNOSIS — G8929 Other chronic pain: Secondary | ICD-10-CM

## 2016-01-09 DIAGNOSIS — M542 Cervicalgia: Secondary | ICD-10-CM

## 2016-01-09 DIAGNOSIS — M549 Dorsalgia, unspecified: Secondary | ICD-10-CM

## 2016-01-09 NOTE — Telephone Encounter (Signed)
Pt needs refill on her pain meds.

## 2016-01-11 NOTE — Telephone Encounter (Signed)
Patient would like for you to return her call. Would like to know if she is able to get this refill. She was trying to go out of town Sunday. [id number]

## 2016-01-15 NOTE — Telephone Encounter (Signed)
I have been out of the office of [facility] 1/11-1/16 and any controled substance RX will be printed out on my return to the office on 01/16/16.

## 2016-01-16 MED ORDER — PERCOCET 5-325 MG PO TABS: 1.0000 | ORAL_TABLET | Freq: Four times a day (QID) | ORAL | Status: DC | PRN

## 2016-01-16 MED ORDER — OXYCODONE HCL ER 10 MG PO T12A: 10.0000 mg | EXTENDED_RELEASE_TABLET | Freq: Two times a day (BID) | ORAL | Status: DC

## 2016-01-25 ENCOUNTER — Other Ambulatory Visit: Payer: Self-pay | Admitting: Family Medicine

## 2016-01-25 DIAGNOSIS — E669 Obesity, unspecified: Secondary | ICD-10-CM

## 2016-01-25 NOTE — Telephone Encounter (Signed)
Needs refill on CYMBALTA, AND HER DIET MEDICATION. [place]

## 2016-01-26 MED ORDER — PHENTERMINE HCL 37.5 MG PO CAPS: 37.5000 mg | ORAL_CAPSULE | ORAL | Status: DC

## 2016-01-26 MED ORDER — DULOXETINE HCL 60 MG PO CPEP: 60.0000 mg | ORAL_CAPSULE | Freq: Every day | ORAL | Status: DC

## 2016-02-12 ENCOUNTER — Other Ambulatory Visit: Payer: Self-pay

## 2016-02-12 DIAGNOSIS — G8929 Other chronic pain: Secondary | ICD-10-CM

## 2016-02-12 DIAGNOSIS — M542 Cervicalgia: Secondary | ICD-10-CM

## 2016-02-12 DIAGNOSIS — M549 Dorsalgia, unspecified: Secondary | ICD-10-CM

## 2016-02-12 NOTE — Telephone Encounter (Signed)
Patient states she was not asking to get it early.  She just want be able to come to her visit Wednesday and wants to see if you will still refill?  Her son will pick up and bring it to her when they go out of town for the funeral this weekend.

## 2016-02-12 NOTE — Telephone Encounter (Signed)
Pt called states she had a a death in the family and would not be able to make appointment on Wednesday and wanted to know if Dr. Could fill pain medication.  I talked with Dr. Nadine Counts and she said no due to to many incidents of her needing it.

## 2016-02-13 MED ORDER — PERCOCET 5-325 MG PO TABS: 1.0000 | ORAL_TABLET | Freq: Four times a day (QID) | ORAL | Status: DC | PRN

## 2016-02-13 NOTE — Telephone Encounter (Signed)
Left patient voicemail.  Please refill for 1 month.

## 2016-02-13 NOTE — Telephone Encounter (Signed)
Keep appointment Rx for pain medication will be refilled that day with reduced quantity.

## 2016-02-14 ENCOUNTER — Ambulatory Visit: Payer: BLUE CROSS/BLUE SHIELD | Admitting: Family Medicine

## 2016-02-26 ENCOUNTER — Other Ambulatory Visit: Payer: Self-pay | Admitting: Family Medicine

## 2016-02-26 DIAGNOSIS — G8929 Other chronic pain: Secondary | ICD-10-CM

## 2016-02-26 DIAGNOSIS — M542 Cervicalgia: Secondary | ICD-10-CM

## 2016-02-26 DIAGNOSIS — M549 Dorsalgia, unspecified: Secondary | ICD-10-CM

## 2016-02-26 MED ORDER — OXYCODONE HCL ER 10 MG PO T12A: 10.0000 mg | EXTENDED_RELEASE_TABLET | Freq: Two times a day (BID) | ORAL | Status: DC

## 2016-02-26 NOTE — Telephone Encounter (Signed)
Patient is requesting a refill on OxyCodone 10mg  12hr tab.  Please once ready for pick up

## 2016-02-26 NOTE — Progress Notes (Signed)
I spoke with the pharmacist and verified that patient picked up the following medications over the past few months:  Roxicet 5-325mg : picked up 01/16/16 and 02/16/16 Oxycontin 10mg  q12 hrs: picked up 01/31/16 and 12/22/15  I will print out Oxycontin 10 MG every 12hrs quantity #34 to be filled 02/26/16 to last until 03/14/16 that way I can fill next RX for BOTH Roxicet and Oxycontin on 03/15/16.

## 2016-03-06 ENCOUNTER — Ambulatory Visit: Payer: BLUE CROSS/BLUE SHIELD | Admitting: Family Medicine

## 2016-03-13 ENCOUNTER — Encounter: Payer: Self-pay | Admitting: Family Medicine

## 2016-03-13 ENCOUNTER — Ambulatory Visit (INDEPENDENT_AMBULATORY_CARE_PROVIDER_SITE_OTHER): Payer: BLUE CROSS/BLUE SHIELD | Admitting: Family Medicine

## 2016-03-13 VITALS — BP 124/84 | HR 101 | Temp 99.0°F | Resp 14 | Ht 62.0 in | Wt 179.0 lb

## 2016-03-13 DIAGNOSIS — R1012 Left upper quadrant pain: Secondary | ICD-10-CM | POA: Diagnosis not present

## 2016-03-13 DIAGNOSIS — G8929 Other chronic pain: Secondary | ICD-10-CM | POA: Diagnosis not present

## 2016-03-13 DIAGNOSIS — F1120 Opioid dependence, uncomplicated: Secondary | ICD-10-CM

## 2016-03-13 DIAGNOSIS — F325 Major depressive disorder, single episode, in full remission: Secondary | ICD-10-CM | POA: Diagnosis not present

## 2016-03-13 DIAGNOSIS — E669 Obesity, unspecified: Secondary | ICD-10-CM

## 2016-03-13 DIAGNOSIS — M549 Dorsalgia, unspecified: Secondary | ICD-10-CM | POA: Diagnosis not present

## 2016-03-13 DIAGNOSIS — M542 Cervicalgia: Secondary | ICD-10-CM | POA: Diagnosis not present

## 2016-03-13 DIAGNOSIS — E66811 Obesity, class 1: Secondary | ICD-10-CM

## 2016-03-13 MED ORDER — OXYCODONE-ACETAMINOPHEN 5-325 MG PO TABS: 1.0000 | ORAL_TABLET | Freq: Four times a day (QID) | ORAL | Status: DC | PRN

## 2016-03-13 MED ORDER — MORPHINE SULFATE ER 15 MG PO TBCR: 15.0000 mg | EXTENDED_RELEASE_TABLET | Freq: Two times a day (BID) | ORAL | Status: DC

## 2016-03-13 MED ORDER — DULOXETINE HCL 60 MG PO CPEP: 60.0000 mg | ORAL_CAPSULE | Freq: Every day | ORAL | Status: DC

## 2016-03-13 NOTE — Progress Notes (Signed)
Name: Jamie [name]   MRN: [id number]    DOB: [birth year]   Date:03/13/2016       Progress Note  Subjective  Chief Complaint  Chief Complaint  Patient presents with  . Medication Refill  . Pain  . Depression    HPI  Jamie [name] is a 50 year old female with chronic pain of multiple sites including lumbar back area, left shoulder, left arm and LUQ abdominal pain (per patient she is convinced her history of splenic artery thombosis contributes to her pain). I have previously expressed to [facility] that in the time that she has been under my care her pain needs have increased, evidenced by increased dosage and frequency of narcotic medication. Due to this I had encouraged her to implement a plan of weaning down on her pain medication needs. Patient usually complains of arthralgias and myalgias for which has been present for several years. Pain is located in multiple joints, is described as aching and throbbing, and is constant . Associated symptoms include: decreased range of motion. The patient has continues to be dependent on prescription opioid medications. Over the Winter months in 2015 she had requested increasing Roxicet frequency to every 4 hrs but failed to wean down back to every 6 hrs usage through the Spring and Summer. Then in Winter in 2016 she was having more pain. At our last visit 11/14/15 I started her back on Oxycontin 10mg  po q12hrs along with reduced quantity of Percocet 5-325mg  po q6-8hrs #120 dispensed. She also wanted another trial of phentermine to help manage her weight last time. She weight 181 lbs and today weighs 179 lbs. Using Cymbalta 60 mg daily for pain and mood control.   Today she reports that the Percocet 5-325 costs her $72.00, Oxycontin 10 mg costs her $145.00 and her Cymbalta costs her $80.00. She talked with her pharmacist and would like to return to Roxicet at the same dose and MS contin as this may be more cost effective.   Past Medical History  Diagnosis Date   . Infarction of spleen   . Obesity, Class II, BMI 35-39.9, with comorbidity ([place])   . Depressive disorder     followed by Dr. Alethia Berthold.  . Disturbance, sleep   . Chronic pain not due to malignancy   . Embolism and thrombosis of splenic artery   . Brachial neuritis   . Lumbosacral neuritis   . Chronic neck pain   . Elevated blood pressure reading without diagnosis of hypertension   . H/O thrombophlebitis     Patient Active Problem List   Diagnosis Date Noted  . Abdominal pain, chronic, left upper quadrant 09/26/2015  . Opioid dependence with physiological dependence ([place]) 09/26/2015  . Need for immunization against influenza 09/26/2015  . Leg pain, anterior 08/18/2015  . Chronic neck and back pain 06/02/2015  . Hypertension goal BP (blood pressure) < 140/90 06/02/2015  . Major depression in remission ([place]) 06/02/2015  . Obesity, Class I, BMI 30-34.9 06/02/2015  . Embolism and thrombosis of splenic artery 09/24/2010  . Chronic pain not due to malignancy 09/24/2010  . History of spleen injury 09/24/2010    Social History  Substance Use Topics  . Smoking status: Never Smoker   . Smokeless tobacco: Not on file  . Alcohol Use: No     Current outpatient prescriptions:  .  DULoxetine (CYMBALTA) 60 MG capsule, Take 1 capsule (60 mg total) by mouth daily., Disp: 90 capsule, Rfl: 1 .  oxyCODONE (OXYCONTIN) 10  mg 12 hr tablet, Take 1 tablet (10 mg total) by mouth every 12 (twelve) hours., Disp: 34 tablet, Rfl: 0 .  PERCOCET 5-325 MG tablet, Take 1 tablet by mouth every 6 (six) hours as needed for severe pain., Disp: 120 tablet, Rfl: 0  Past Surgical History  Procedure Laterality Date  . Cesarean section  09/24/2010  . Tubal ligation  09/24/2010  . Tonsillectomy and adenoidectomy  09/24/2010    No family history on file.  Allergies  Allergen Reactions  . Neuromuscular Blocking Agents   . Hydrocodone-Acetaminophen Nausea Only     Review of Systems  CONSTITUTIONAL:  No significant weight changes, fever, chills, weakness or fatigue.   CARDIOVASCULAR: No chest pain, chest pressure or chest discomfort. No palpitations or edema.  RESPIRATORY: No shortness of breath, cough or sputum.  GASTROINTESTINAL: No anorexia, nausea, vomiting. No changes in bowel habits. Chronic abdominal pain.  GENITOURINARY: No dysuria. No frequency. No discharge.  NEUROLOGICAL: No headache, dizziness, syncope, paralysis, ataxia, numbness or tingling in the extremities. No memory changes. No change in bowel or bladder control.  MUSCULOSKELETAL: Chronic joint pain. No muscle pain. HEMATOLOGIC: No anemia, bleeding or bruising.  LYMPHATICS: No enlarged lymph nodes.  PSYCHIATRIC: No change in mood. No change in sleep pattern.  ENDOCRINOLOGIC: No reports of sweating, cold or heat intolerance. No polyuria or polydipsia.     Objective  BP 124/84 mmHg  Pulse 101  Temp(Src) 99 F (37.2 C) (Oral)  Resp 14  Ht 5\' 2"  (1.575 m)  Wt 179 lb (81.194 kg)  BMI 32.73 kg/m2  SpO2 97% Body mass index is 32.73 kg/(m^2).  Physical Exam  Constitutional: Patient is obese and well-nourished. In no distress. .  Cardiovascular: Normal rate, regular rhythm and normal heart sounds. No murmur heard.  Pulmonary/Chest: Effort normal and breath sounds normal. No respiratory distress. Abdomen: Soft, ND, obese with normal bowel sounds in all 4 quadrants and mild tenderness LUQ.  Musculoskeletal: Normal range of motion right UE, left UE restricted ROM beyond 90 degrees flexion and abduction due to chronic mild swelling with warmth.  Neurological: CN II-XII grossly intact with no focal deficits. Alert and oriented to person, place, and time. Coordination, balance, strength, speech and gait are normal.  Psychiatric: Patient has a stable mood and affect. Behavior is normal in office today. Judgment and thought content normal in office today.   Assessment & Plan  1. Chronic neck and back pain Stable.  Due to cost of previous prescriptions I have switched her back to MS Contin Generic and Roxicet generic.  The patient has been prescribed a controled substance today under the agreement of a filed pain treatment regimen contract.  With use of this medication they verbalize understanding the potential risk of addiction, abuse, and misuse, which can lead to overdose and death. The patient may not obtain and use other illicit or controled substances from any other sources while under the aformentioned contract. A urine drug screen will be performed periodically and the patient's name will be reviewed on the [place] Controlled Substance Reporting System regularly.  The patient expresses understanding the above statement and agreement to comply.  The patient has been counseled on the proper use, side effects and potential interactions of the new medication with other prescribed and OTC medications. Under no circumstances is this (and any other) medication to be use with alcohol or other illicit drugs. This medication is not to be crushed, chewed, sniffed, injected or used in any other way other than what  is stated in the directions. This medication is to be used at the frequency and quantity that is stated in the directions. This medication is to be used only by the individual who's name is on the prescription bottle. Drug sharing and selling is unacceptable. Patient voices understanding what has been said today.   - DULoxetine (CYMBALTA) 60 MG capsule; Take 1 capsule (60 mg total) by mouth daily.  Dispense: 90 capsule; Refill: 1 - oxyCODONE-acetaminophen (PERCOCET/ROXICET) 5-325 MG tablet; Take 1 tablet by mouth every 6 (six) hours as needed for severe pain.  Dispense: 120 tablet; Refill: 0 - oxyCODONE-acetaminophen (PERCOCET/ROXICET) 5-325 MG tablet; Take 1 tablet by mouth every 6 (six) hours as needed for severe pain.  Dispense: 120 tablet; Refill: 0 - oxyCODONE-acetaminophen (PERCOCET/ROXICET) 5-325 MG tablet;  Take 1 tablet by mouth every 6 (six) hours as needed for severe pain.  Dispense: 120 tablet; Refill: 0 - morphine (MS CONTIN) 15 MG 12 hr tablet; Take 1 tablet (15 mg total) by mouth every 12 (twelve) hours.  Dispense: 60 tablet; Refill: 0 - morphine (MS CONTIN) 15 MG 12 hr tablet; Take 1 tablet (15 mg total) by mouth every 12 (twelve) hours.  Dispense: 60 tablet; Refill: 0 - morphine (MS CONTIN) 15 MG 12 hr tablet; Take 1 tablet (15 mg total) by mouth every 12 (twelve) hours.  Dispense: 60 tablet; Refill: 0  2. Opioid dependence with physiological dependence ([place]) She hasn't made much effort to wean down on opioid use.   3. Abdominal pain, chronic, left upper quadrant Long standing issues.   - oxyCODONE-acetaminophen (PERCOCET/ROXICET) 5-325 MG tablet; Take 1 tablet by mouth every 6 (six) hours as needed for severe pain.  Dispense: 120 tablet; Refill: 0 - oxyCODONE-acetaminophen (PERCOCET/ROXICET) 5-325 MG tablet; Take 1 tablet by mouth every 6 (six) hours as needed for severe pain.  Dispense: 120 tablet; Refill: 0 - oxyCODONE-acetaminophen (PERCOCET/ROXICET) 5-325 MG tablet; Take 1 tablet by mouth every 6 (six) hours as needed for severe pain.  Dispense: 120 tablet; Refill: 0 - morphine (MS CONTIN) 15 MG 12 hr tablet; Take 1 tablet (15 mg total) by mouth every 12 (twelve) hours.  Dispense: 60 tablet; Refill: 0 - morphine (MS CONTIN) 15 MG 12 hr tablet; Take 1 tablet (15 mg total) by mouth every 12 (twelve) hours.  Dispense: 60 tablet; Refill: 0 - morphine (MS CONTIN) 15 MG 12 hr tablet; Take 1 tablet (15 mg total) by mouth every 12 (twelve) hours.  Dispense: 60 tablet; Refill: 0  4. Obesity, Class I, BMI 30-34.9 Not much weight change.  5. Major depression in remission ([place]) Stable.   - DULoxetine (CYMBALTA) 60 MG capsule; Take 1 capsule (60 mg total) by mouth daily.  Dispense: 90 capsule; Refill: 1

## 2016-06-05 ENCOUNTER — Ambulatory Visit: Payer: BLUE CROSS/BLUE SHIELD | Admitting: Family Medicine

## 2016-06-17 ENCOUNTER — Ambulatory Visit: Payer: BLUE CROSS/BLUE SHIELD | Admitting: Family Medicine

## 2016-06-26 ENCOUNTER — Encounter: Payer: Self-pay | Admitting: Family Medicine

## 2016-06-26 ENCOUNTER — Ambulatory Visit (INDEPENDENT_AMBULATORY_CARE_PROVIDER_SITE_OTHER): Payer: BLUE CROSS/BLUE SHIELD | Admitting: Family Medicine

## 2016-06-26 ENCOUNTER — Ambulatory Visit: Payer: BLUE CROSS/BLUE SHIELD

## 2016-06-26 VITALS — BP 140/78 | HR 73 | Temp 98.4°F | Resp 16 | Wt 182.3 lb

## 2016-06-26 DIAGNOSIS — I82622 Acute embolism and thrombosis of deep veins of left upper extremity: Secondary | ICD-10-CM | POA: Insufficient documentation

## 2016-06-26 DIAGNOSIS — F325 Major depressive disorder, single episode, in full remission: Secondary | ICD-10-CM

## 2016-06-26 DIAGNOSIS — D735 Infarction of spleen: Secondary | ICD-10-CM | POA: Diagnosis not present

## 2016-06-26 DIAGNOSIS — F1120 Opioid dependence, uncomplicated: Secondary | ICD-10-CM | POA: Diagnosis not present

## 2016-06-26 DIAGNOSIS — M542 Cervicalgia: Secondary | ICD-10-CM | POA: Diagnosis not present

## 2016-06-26 DIAGNOSIS — M549 Dorsalgia, unspecified: Secondary | ICD-10-CM

## 2016-06-26 DIAGNOSIS — R1012 Left upper quadrant pain: Secondary | ICD-10-CM

## 2016-06-26 DIAGNOSIS — E669 Obesity, unspecified: Secondary | ICD-10-CM | POA: Diagnosis not present

## 2016-06-26 DIAGNOSIS — G8929 Other chronic pain: Secondary | ICD-10-CM

## 2016-06-26 DIAGNOSIS — Z79899 Other long term (current) drug therapy: Secondary | ICD-10-CM | POA: Diagnosis not present

## 2016-06-26 DIAGNOSIS — M79602 Pain in left arm: Secondary | ICD-10-CM

## 2016-06-26 MED ORDER — GABAPENTIN 300 MG PO CAPS: ORAL_CAPSULE | ORAL | Status: DC

## 2016-06-26 MED ORDER — OXYCODONE-ACETAMINOPHEN 5-325 MG PO TABS: 1.0000 | ORAL_TABLET | Freq: Four times a day (QID) | ORAL | Status: DC | PRN

## 2016-06-26 MED ORDER — DULOXETINE HCL 60 MG PO CPEP: 60.0000 mg | ORAL_CAPSULE | Freq: Every day | ORAL | Status: DC

## 2016-06-26 NOTE — Assessment & Plan Note (Signed)
STAT left arm ultrasound to r/o DVT today
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
## 2016-06-26 NOTE — Assessment & Plan Note (Signed)
Will get STAT left upper extremity venous duplex to look for recurrent blood clot; if negative, then consider RSD (?) as cause versus radiculopathy, though I explained I would defer to pain doctor to diagnose RSD

## 2016-06-26 NOTE — Assessment & Plan Note (Signed)
Chronic neck and back pain 

## 2016-06-26 NOTE — Assessment & Plan Note (Signed)

## 2016-06-26 NOTE — Patient Instructions (Addendum)
Please go to the hospital to have the test for the left arm done today; they'll do that at 3:15 pm but you are welcome to go now to see if they have a cancellation  Start gabapentin 300 mg one at bedtime for 3 nights, then one pill twice a day We'll taper that up if needed Never stop the gabapentin cold [place]; we'll taper that up and taper than down  We'll have you see the pain clinic as soon as possible  Return in 2 weeks   DASH Eating Plan DASH stands for "Dietary Approaches to Stop Hypertension." The DASH eating plan is a healthy eating plan that has been shown to reduce high blood pressure (hypertension). Additional health benefits may include reducing the risk of type 2 diabetes mellitus, heart disease, and stroke. The DASH eating plan may also help with weight loss. WHAT DO I NEED TO KNOW ABOUT THE DASH EATING PLAN? For the DASH eating plan, you will follow these general guidelines:  Choose foods with a percent daily value for sodium of less than 5% (as listed on the food label).  Use salt-free seasonings or herbs instead of table salt or sea salt.  Check with your health care provider or pharmacist before using salt substitutes.  Eat lower-sodium products, often labeled as "lower sodium" or "no salt added."  Eat fresh foods.  Eat more vegetables, fruits, and low-fat dairy products.  Choose whole grains. Look for the word "whole" as the first word in the ingredient list.  Choose fish and skinless chicken or [place] more often than red meat. Limit fish, poultry, and meat to 6 oz (170 g) each day.  Limit sweets, desserts, sugars, and sugary drinks.  Choose heart-healthy fats.  Limit cheese to 1 oz (28 g) per day.  Eat more home-cooked food and less restaurant, buffet, and fast food.  Limit fried foods.  Cook foods using methods other than frying.  Limit canned vegetables. If you do use them, rinse them well to decrease the sodium.  When eating at a restaurant, ask  that your food be prepared with less salt, or no salt if possible. WHAT FOODS CAN I EAT? Seek help from a dietitian for individual calorie needs. Grains Whole grain or whole wheat bread. Brown rice. Whole grain or whole wheat pasta. Quinoa, bulgur, and whole grain cereals. Low-sodium cereals. Corn or whole wheat flour tortillas. Whole grain cornbread. Whole grain crackers. Low-sodium crackers. Vegetables Fresh or frozen vegetables (raw, steamed, roasted, or grilled). Low-sodium or reduced-sodium tomato and vegetable juices. Low-sodium or reduced-sodium tomato sauce and paste. Low-sodium or reduced-sodium canned vegetables.  Fruits All fresh, canned (in natural juice), or frozen fruits. Meat and Other Protein Products Ground beef (85% or leaner), grass-fed beef, or beef trimmed of fat. Skinless chicken or [place]. Ground chicken or [place]. Pork trimmed of fat. All fish and seafood. Eggs. Dried beans, peas, or lentils. Unsalted nuts and seeds. Unsalted canned beans. Dairy Low-fat dairy products, such as skim or 1% milk, 2% or reduced-fat cheeses, low-fat ricotta or cottage cheese, or plain low-fat yogurt. Low-sodium or reduced-sodium cheeses. Fats and Oils Tub margarines without trans fats. Light or reduced-fat mayonnaise and salad dressings (reduced sodium). Avocado. Safflower, olive, or canola oils. Natural peanut or almond butter. Other Unsalted popcorn and pretzels. The items listed above may not be a complete list of recommended foods or beverages. Contact your dietitian for more options. WHAT FOODS ARE NOT RECOMMENDED? Grains White bread. White pasta. White rice. Refined cornbread. Bagels  and croissants. Crackers that contain trans fat. Vegetables Creamed or fried vegetables. Vegetables in a cheese sauce. Regular canned vegetables. Regular canned tomato sauce and paste. Regular tomato and vegetable juices. Fruits Dried fruits. Canned fruit in light or heavy syrup. Fruit juice. Meat and  Other Protein Products Fatty cuts of meat. Ribs, chicken wings, bacon, sausage, bologna, salami, chitterlings, fatback, hot dogs, bratwurst, and packaged luncheon meats. Salted nuts and seeds. Canned beans with salt. Dairy Whole or 2% milk, cream, half-and-half, and cream cheese. Whole-fat or sweetened yogurt. Full-fat cheeses or blue cheese. Nondairy creamers and whipped toppings. Processed cheese, cheese spreads, or cheese curds. Condiments Onion and garlic salt, seasoned salt, table salt, and sea salt. Canned and packaged gravies. Worcestershire sauce. Tartar sauce. Barbecue sauce. Teriyaki sauce. Soy sauce, including reduced sodium. Steak sauce. Fish sauce. Oyster sauce. Cocktail sauce. Horseradish. Ketchup and mustard. Meat flavorings and tenderizers. Bouillon cubes. Hot sauce. Tabasco sauce. Marinades. Taco seasonings. Relishes. Fats and Oils Butter, stick margarine, lard, shortening, ghee, and bacon fat. Coconut, palm kernel, or palm oils. Regular salad dressings. Other Pickles and olives. Salted popcorn and pretzels. The items listed above may not be a complete list of foods and beverages to avoid. Contact your dietitian for more information. WHERE CAN I FIND MORE INFORMATION? National Heart, Lung, and Blood Institute: travelstabloid.com   This information is not intended to replace advice given to you by your health care provider. Make sure you discuss any questions you have with your health care provider.   Document Released: 12/05/2011 Document Revised: 01/06/2015 Document Reviewed: 10/20/2013 Elsevier Interactive Patient Education [employer].

## 2016-06-26 NOTE — Assessment & Plan Note (Signed)
Patient has been on long-acting and short-acting opioids for years; I would like to refer her to the pain clinic for ongoing management, other modalities to consider for her chronic pain; she has had injections in the past; she suffered withdrawal symptoms coming off of medicines last week; will start her back on medicines with UDS, contract, etc. today

## 2016-06-27 ENCOUNTER — Telehealth: Payer: Self-pay

## 2016-06-27 NOTE — Telephone Encounter (Signed)
Got a message from the hospital that patient never showed up for STAT u/s of arm.  I tried to call patient and have left multiple voicemails.

## 2016-07-04 ENCOUNTER — Encounter: Payer: Self-pay | Admitting: Family Medicine

## 2016-07-04 DIAGNOSIS — F1290 Cannabis use, unspecified, uncomplicated: Secondary | ICD-10-CM | POA: Insufficient documentation

## 2016-07-04 NOTE — Assessment & Plan Note (Signed)
Patient came to me already having been on long-acting morphine sulfate and four times a day breakthrough oxycodone/apap; this is not my preference for chronic non-malignant pain; I explained that I am not comfortable writing her to be back on the long-acting morphine sulfate, but I am willing to give a limited supply of her "breakthrough" medicine until the UDS results come back; will be referring her to pain clinic

## 2016-07-04 NOTE — Assessment & Plan Note (Signed)
Continue cymbalta 60 mg daily, which may help with pain as well

## 2016-07-04 NOTE — Assessment & Plan Note (Signed)
I am not prescribing phentermine

## 2016-07-05 ENCOUNTER — Ambulatory Visit
Admission: RE | Admit: 2016-07-05 | Discharge: 2016-07-05 | Disposition: A | Discharge status: Home / Self Care | Payer: BLUE CROSS/BLUE SHIELD | Source: Ambulatory Visit | Attending: Family Medicine | Admitting: Family Medicine

## 2016-07-05 DIAGNOSIS — I82622 Acute embolism and thrombosis of deep veins of left upper extremity: Secondary | ICD-10-CM | POA: Insufficient documentation

## 2016-07-05 DIAGNOSIS — M79602 Pain in left arm: Secondary | ICD-10-CM | POA: Diagnosis not present

## 2016-07-08 ENCOUNTER — Encounter: Payer: Self-pay | Admitting: Family Medicine

## 2016-07-08 ENCOUNTER — Telehealth: Payer: Self-pay

## 2016-07-08 NOTE — Telephone Encounter (Signed)
STAT DVT US was negative

## 2016-07-08 NOTE — Telephone Encounter (Signed)
Thank you; patient notified by CMA earlier today

## 2016-07-11 ENCOUNTER — Ambulatory Visit (INDEPENDENT_AMBULATORY_CARE_PROVIDER_SITE_OTHER): Payer: BLUE CROSS/BLUE SHIELD | Admitting: Family Medicine

## 2016-07-11 ENCOUNTER — Encounter: Payer: Self-pay | Admitting: Family Medicine

## 2016-07-11 VITALS — BP 124/84 | HR 88 | Temp 99.2°F | Resp 18 | Ht 62.0 in | Wt 185.4 lb

## 2016-07-11 DIAGNOSIS — F325 Major depressive disorder, single episode, in full remission: Secondary | ICD-10-CM

## 2016-07-11 DIAGNOSIS — F1120 Opioid dependence, uncomplicated: Secondary | ICD-10-CM | POA: Diagnosis not present

## 2016-07-11 DIAGNOSIS — Z5181 Encounter for therapeutic drug level monitoring: Secondary | ICD-10-CM | POA: Diagnosis not present

## 2016-07-11 DIAGNOSIS — G8929 Other chronic pain: Secondary | ICD-10-CM | POA: Diagnosis not present

## 2016-07-11 DIAGNOSIS — Z91199 Patient's noncompliance with other medical treatment and regimen due to unspecified reason: Secondary | ICD-10-CM | POA: Insufficient documentation

## 2016-07-11 DIAGNOSIS — M79602 Pain in left arm: Secondary | ICD-10-CM | POA: Diagnosis not present

## 2016-07-11 LAB — COMPREHENSIVE METABOLIC PANEL
ALBUMIN: 3.8 g/dL (ref 3.6–5.1)
ALT: 11 U/L (ref 6–29)
AST: 15 U/L (ref 10–35)
Alkaline Phosphatase: 66 U/L (ref 33–115)
BILIRUBIN TOTAL: 0.3 mg/dL (ref 0.2–1.2)
BUN: 16 mg/dL (ref 7–25)
CO2: 25 mmol/L (ref 20–31)
CREATININE: 0.75 mg/dL (ref 0.50–1.10)
Calcium: 8.7 mg/dL (ref 8.6–10.2)
Chloride: 107 mmol/L (ref 98–110)
Glucose, Bld: 89 mg/dL (ref 65–99)
Potassium: 4.4 mmol/L (ref 3.5–5.3)
SODIUM: 139 mmol/L (ref 135–146)
TOTAL PROTEIN: 6.6 g/dL (ref 6.1–8.1)

## 2016-07-11 LAB — CBC WITH DIFFERENTIAL/PLATELET
Basophils Absolute: 78 cells/uL (ref 0–200)
Basophils Relative: 1 %
EOS PCT: 2 %
Eosinophils Absolute: 156 cells/uL (ref 15–500)
HCT: 37.5 % (ref 35.0–45.0)
Hemoglobin: 12.3 g/dL (ref 11.7–15.5)
LYMPHS ABS: 2106 {cells}/uL (ref 850–3900)
Lymphocytes Relative: 27 %
MCH: 29.4 pg (ref 27.0–33.0)
MCHC: 32.8 g/dL (ref 32.0–36.0)
MCV: 89.5 fL (ref 80.0–100.0)
MPV: 10.2 fL (ref 7.5–12.5)
Monocytes Absolute: 624 cells/uL (ref 200–950)
Monocytes Relative: 8 %
NEUTROS ABS: 4836 {cells}/uL (ref 1500–7800)
Neutrophils Relative %: 62 %
Platelets: 426 10*3/uL — ABNORMAL HIGH (ref 140–400)
RBC: 4.19 MIL/uL (ref 3.80–5.10)
RDW: 13.6 % (ref 11.0–15.0)
WBC: 7.8 10*3/uL (ref 3.8–10.8)

## 2016-07-11 NOTE — Assessment & Plan Note (Signed)
Recent venous duplex was negative for DVT

## 2016-07-11 NOTE — Progress Notes (Signed)
BP 124/84 mmHg  Pulse 88  Temp(Src) 99.2 F (37.3 C)  Resp 18  Ht _0  (1.575 m)  Wt 185 lb 7 oz (84.114 kg)  BMI 33.91 kg/m2  SpO2 97%  LMP 06/18/2016 (Approximate)   Subjective:    Patient ID: Jamie [name], female    DOB: Aug 16, 1966, 50 y.o.   MRN: [id number]  HPI: Jamie [name] is a 50 y.o. female  Chief Complaint  Patient presents with  . Medication Refill    2 week follow up for medication management   Patient is here for follow-up; I met her at the last visit; her previous provider moved away No medical excitement since last visit, except that she finally got the ultrasound of the left UE; she was supposed to have had it that same day, but got called away as her son was in a accident and was taken to [facility] Chronic pain not due to malignancy was the reason she had been on the long-acting and short-acting opiates; she says she had been on that for years; Dr. Nadine Counts was prescribing her pain medicine She has not really taken anything lately for pain like ibuprofen or Aleve; nothing but the narcotics for years; however, with further questioning as we addressed other ways to address pain, she says she can take ibuprofen and is taking that 800 mg three times a day already; using OTC 200 mg pills, takes 4 of those 3 times a day (that is the MAX I explained right away); no problems with stomach upset or blood in the stool on the ibuprofen She is taking the gabapentin as prescribed Unfortunately, her urine drug screen was positive for marijuana; she says that she doesn't use marijuana but where she goes to visit family, they use marijuana; she says that when she was out of pain pills, they offered her something for pain, but she denied it [place] website reviewed; no other fills since the 30 pills of oxycodone that were written at the last visit by me while awaiting UDS results Last dose of oxycodone was two days ago; no diarrhea She has seen psychiatrist for her depression  in the past; no bipolar depression, just unipolar depression; she quit going to psychiatry because of the co-pay and because Dr. Nadine Counts could just write her medicines; she denies any thoughts of self-harm  Depression screen [facility] 2/9 06/26/2016 03/13/2016 11/14/2015 08/18/2015  Decreased Interest 0 0 1 0  Down, Depressed, Hopeless _1 PHQ - 2 Score _2 Altered sleeping - - 1 -  Tired, decreased energy - - 3 -  Change in appetite - - 1 -  Feeling bad or failure about yourself  - - 0 -  Trouble concentrating - - 1 -  Moving slowly or fidgety/restless - - 0 -  Suicidal thoughts - - 0 -  PHQ-9 Score - - 8 -  Difficult doing work/chores - - Somewhat difficult -   Relevant past medical, surgical, family and social history reviewed Past Medical History  Diagnosis Date  . Infarction of spleen   . Obesity, Class II, BMI 35-39.9, with comorbidity ([place])   . Depressive disorder     followed by Dr. Alethia Berthold.  . Disturbance, sleep   . Chronic pain not due to malignancy   . Embolism and thrombosis of splenic artery   . Brachial neuritis   . Lumbosacral neuritis   . Chronic neck pain   . Elevated blood  pressure reading without diagnosis of hypertension   . H/O thrombophlebitis    Past Surgical History  Procedure Laterality Date  . Cesarean section  09/24/2010  . Tubal ligation  09/24/2010  . Tonsillectomy and adenoidectomy  09/24/2010   History reviewed. No pertinent family history. Social History  Substance Use Topics  . Smoking status: Never Smoker   . Smokeless tobacco: None  . Alcohol Use: No   Interim medical history since last visit reviewed. Allergies and medications reviewed  Review of Systems Per HPI unless specifically indicated above     Objective:    BP 124/84 mmHg  Pulse 88  Temp(Src) 99.2 F (37.3 C)  Resp 18  Ht _0  (1.575 m)  Wt 185 lb 7 oz (84.114 kg)  BMI 33.91 kg/m2  SpO2 97%  LMP 06/18/2016 (Approximate)  Wt Readings from Last 3  Encounters:  07/11/16 185 lb 7 oz (84.114 kg)  06/26/16 182 lb 4.8 oz (82.691 kg)  03/13/16 179 lb (81.194 kg)    Physical Exam  Constitutional: She appears well-developed and well-nourished. No distress.  Cardiovascular: Normal rate and regular rhythm.   Pulmonary/Chest: Effort normal and breath sounds normal.  Skin: She is not diaphoretic. No pallor.  Psychiatric: Her affect is not inappropriate. Her speech is not slurred. She is not agitated, not aggressive, not slowed and not combative.  Euthymic at first; began to cry after discussing the positive urine drug screen; good eye contact with examiner; not despondent   Results for orders placed or performed during the hospital encounter of 09/21/15  CBC WITH DIFFERENTIAL  Result Value Ref Range   WBC 9.5 3.6 - 11.0 K/uL   RBC 4.03 3.80 - 5.20 MIL/uL   Hemoglobin 12.2 12.0 - 16.0 g/dL   HCT 36.5 35.0 - 47.0 %   MCV 90.4 80.0 - 100.0 fL   MCH 30.2 26.0 - 34.0 pg   MCHC 33.4 32.0 - 36.0 g/dL   RDW 13.3 11.5 - 14.5 %   Platelets 370 150 - 440 K/uL   Neutrophils Relative % 58 %   Neutro Abs 5.6 1.4 - 6.5 K/uL   Lymphocytes Relative 32 %   Lymphs Abs 3.0 1.0 - 3.6 K/uL   Monocytes Relative 8 %   Monocytes Absolute 0.7 0.2 - 0.9 K/uL   Eosinophils Relative 1 %   Eosinophils Absolute 0.1 0 - 0.7 K/uL   Basophils Relative 1 %   Basophils Absolute 0.1 0 - 0.1 K/uL  Comprehensive metabolic panel  Result Value Ref Range   Sodium 137 135 - 145 mmol/L   Potassium 3.4 (L) 3.5 - 5.1 mmol/L   Chloride 105 101 - 111 mmol/L   CO2 26 22 - 32 mmol/L   Glucose, Bld 87 65 - 99 mg/dL   BUN 17 6 - 20 mg/dL   Creatinine, Ser 0.68 0.44 - 1.00 mg/dL   Calcium 8.4 (L) 8.9 - 10.3 mg/dL   Total Protein 7.5 6.5 - 8.1 g/dL   Albumin 3.9 3.5 - 5.0 g/dL   AST 21 15 - 41 U/L   ALT 13 (L) 14 - 54 U/L   Alkaline Phosphatase 67 38 - 126 U/L   Total Bilirubin 0.7 0.3 - 1.2 mg/dL   GFR calc non Af Amer >60 >60 mL/min   GFR calc Af Amer >60 >60 mL/min    Anion gap 6 5 - 15  Lipase, blood  Result Value Ref Range   Lipase 41 22 - 51 U/L  Urinalysis  complete, with microscopic ([facility] only)  Result Value Ref Range   Color, Urine YELLOW (A) YELLOW   APPearance HAZY (A) CLEAR   Glucose, UA NEGATIVE NEGATIVE mg/dL   Bilirubin Urine NEGATIVE NEGATIVE   Ketones, ur NEGATIVE NEGATIVE mg/dL   Specific Gravity, Urine 1.018 1.005 - 1.030   Hgb urine dipstick NEGATIVE NEGATIVE   pH 6.0 5.0 - 8.0   Protein, ur NEGATIVE NEGATIVE mg/dL   Nitrite NEGATIVE NEGATIVE   Leukocytes, UA NEGATIVE NEGATIVE   RBC / HPF 0-5 0 - 5 RBC/hpf   WBC, UA 0-5 0 - 5 WBC/hpf   Bacteria, UA RARE (A) NONE SEEN   Squamous Epithelial / LPF 0-5 (A) NONE SEEN  Pregnancy, urine POC  Result Value Ref Range   Preg Test, Ur NEGATIVE NEGATIVE      Assessment & Plan:   Problem List Items Addressed This Visit      Other   Pain In Left Arm    Recent venous duplex was negative for DVT      Opioid dependence with physiological dependence (HCC)    Diagnosis listed in problem list from previous visit; she actually had no narcotics in her urine drug screen when checked last, as she reported having run out of pills; she had marijuana in her urine during the last urine drug screen which I explained is inconsistent with our clinic policy of prescribing controlled substances; she maintains that she does not smoke marijuana but is around it; I explained she needs to be very careful what she is putting in her body and what she's around, because this got into her system; I also stressed the importance of NOT taking any pain medicine or other medicines offered by her friends or family, even if they are well-meaning and trying to help her, because she could have an accidental fatal overdose; she seemed to understand; I am here to help so let me know if anything I can do, but I simply cannot prescribe controlled substances for her any more      Medication monitoring encounter   Relevant Orders     CBC with Differential/Platelet   Comprehensive Metabolic Panel (CMET)   Major depression in remission ([place])    Continue cymbalta; recommended psychiatrist to work with her in regards to the depression and the pain; she became tearful upon learning about her UDS, and I urged her to not let this get her down; we'll have her see mental health professionals to help her get through pain and mood, rise above this, learn from the past, move on, etc.      Relevant Orders   Ambulatory referral to Psychiatry   Chronic pain not due to malignancy - Primary (Chronic)    Chronic pain related to DVT, splenic artery infarct; on opioids for years; now with positive UDS; will refer to pain clinic for consideration of opioids versus other modalities to treat her chronic pain; ibuprofen 800 mg TID is the absolute max dose; check renal function on that; continue gabapentin; continue cymbalta; recommended acupuncture; recommend counseling and psychotherapy for pain      Relevant Orders   Ambulatory referral to Pain Clinic      Follow up plan: No Follow-up on file.  An after-visit summary was printed and given to the patient at [place].  Please see the patient instructions which may contain other information and recommendations beyond what is mentioned above in the assessment and plan.  Orders Placed This Encounter  Procedures  . CBC  with Differential/Platelet  . Comprehensive Metabolic Panel (CMET)  . Ambulatory referral to Pain Clinic  . Ambulatory referral to Psychiatry

## 2016-07-11 NOTE — Assessment & Plan Note (Signed)
Diagnosis listed in problem list from previous visit; she actually had no narcotics in her urine drug screen when checked last, as she reported having run out of pills; she had marijuana in her urine during the last urine drug screen which I explained is inconsistent with our clinic policy of prescribing controlled substances; she maintains that she does not smoke marijuana but is around it; I explained she needs to be very careful what she is putting in her body and what she's around, because this got into her system; I also stressed the importance of NOT taking any pain medicine or other medicines offered by her friends or family, even if they are well-meaning and trying to help her, because she could have an accidental fatal overdose; she seemed to understand; I am here to help so let me know if anything I can do, but I simply cannot prescribe controlled substances for her any more

## 2016-07-11 NOTE — Assessment & Plan Note (Signed)
Chronic pain related to DVT, splenic artery infarct; on opioids for years; now with positive UDS; will refer to pain clinic for consideration of opioids versus other modalities to treat her chronic pain; ibuprofen 800 mg TID is the absolute max dose; check renal function on that; continue gabapentin; continue cymbalta; recommended acupuncture; recommend counseling and psychotherapy for pain

## 2016-07-11 NOTE — Patient Instructions (Addendum)
Please be cautious of what goes in your body; be safe We'll have you see the pain clinic doctor and the psychiatrist If you develop dark thoughts, seek medical help right away, or call 911 if needed We'll get labs today and contact you If you have not heard anything from my staff in a week about any orders/referrals/studies from today, please contact us here to follow-up [phone]   Steps to Elicit the Relaxation Response The following is the technique reprinted with permission from Dr. Billie Ruddy book The Relaxation Response pages 162-163 1. Sit quietly in a comfortable position. 2. Close your eyes. 3. Deeply relax all your muscles,  beginning at your feet and progressing up to your face.  Keep them relaxed. 4. Breathe through your nose.  Become aware of your breathing.  As you breathe out, say the word, "one"*,  silently to yourself. For example,  breathe in ... out, "one",- in .. out, "one", etc.  Breathe easily and naturally. 5. Continue for 10 to 20 minutes.  You may open your eyes to check the time, but do not use an alarm.  When you finish, sit quietly for several minutes,  at first with your eyes closed and later with your eyes opened.  Do not stand up for a few minutes. 6. Do not worry about whether you are successful  in achieving a deep level of relaxation.  Maintain a passive attitude and permit relaxation to occur at its own pace.  When distracting thoughts occur,  try to ignore them by not dwelling upon them  and return to repeating "one."  With practice, the response should come with little effort.  Practice the technique once or twice daily,  but not within two hours after any meal,  since the digestive processes seem to interfere with  the elicitation of the Relaxation Response. * It is better to use a soothing, mellifluous sound, preferably with no meaning. or association, to avoid stimulation of unnecessary thoughts - a mantra.

## 2016-07-11 NOTE — Assessment & Plan Note (Addendum)
Continue cymbalta; recommended psychiatrist to work with her in regards to the depression and the pain; she became tearful upon learning about her UDS, and I urged her to not let this get her down; we'll have her see mental health professionals to help her get through pain and mood, rise above this, learn from the past, move on, etc.

## 2016-07-25 ENCOUNTER — Telehealth: Payer: Self-pay | Admitting: Family Medicine

## 2016-08-07 NOTE — Telephone Encounter (Signed)
Please check and help patient; thank you

## 2016-08-29 ENCOUNTER — Ambulatory Visit: Payer: BLUE CROSS/BLUE SHIELD | Admitting: Pain Medicine

## 2016-10-11 ENCOUNTER — Ambulatory Visit (INDEPENDENT_AMBULATORY_CARE_PROVIDER_SITE_OTHER): Payer: BLUE CROSS/BLUE SHIELD | Admitting: Family Medicine

## 2016-10-11 ENCOUNTER — Encounter: Payer: Self-pay | Admitting: Family Medicine

## 2016-10-11 DIAGNOSIS — M549 Dorsalgia, unspecified: Secondary | ICD-10-CM

## 2016-10-11 DIAGNOSIS — M542 Cervicalgia: Secondary | ICD-10-CM | POA: Diagnosis not present

## 2016-10-11 DIAGNOSIS — Z9114 Patient's other noncompliance with medication regimen: Secondary | ICD-10-CM

## 2016-10-11 DIAGNOSIS — G5692 Unspecified mononeuropathy of left upper limb: Secondary | ICD-10-CM

## 2016-10-11 DIAGNOSIS — G8929 Other chronic pain: Secondary | ICD-10-CM

## 2016-10-11 MED ORDER — MELOXICAM 15 MG PO TABS: 15.0000 mg | ORAL_TABLET | Freq: Every day | ORAL | 1 refills | Status: DC

## 2016-10-11 NOTE — Assessment & Plan Note (Signed)
Refer to neurologist; patient says she had nerve testing done with Sheral Flow at [facility], but we don't have those records; we'll have to leave it up to neurologist to obtain; taking cymbalta; she cannot tolerate gabapentin

## 2016-10-11 NOTE — Patient Instructions (Addendum)
I've put in referrals for you to see the orthopaedist and the neurologist and physical therapist Stop any over-the-counter ibuprofen and use the new prescription meloxicam If you need something else for aches or pains, try to use Tylenol (acetaminophen) instead of non-steroidals (which include Aleve, ibuprofen, Advil, Motrin, and naproxen); non-steroidals can cause long-term kidney damage Try turmeric as a natural anti-inflammatory (for pain and arthritis). It comes in capsules where you buy aspirin and fish oil, but also as a spice where you buy pepper and garlic powder. Keep the appointment with the pain clinic
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
## 2016-10-11 NOTE — Assessment & Plan Note (Addendum)
Patient to see pain clinic on November 2nd; refer to orthopaedist and neurologist and physical therapist; discussed non-narcotic means to deal with pain

## 2016-10-12 DIAGNOSIS — Z9114 Patient's other noncompliance with medication regimen: Secondary | ICD-10-CM | POA: Insufficient documentation

## 2016-10-12 NOTE — Assessment & Plan Note (Signed)
Prior urine drug screen positive for marijuana, so I am not going to prescribe any controlled substances

## 2016-10-30 ENCOUNTER — Ambulatory Visit: Payer: BLUE CROSS/BLUE SHIELD | Admitting: Pain Medicine

## 2017-08-06 DIAGNOSIS — N760 Acute vaginitis: Secondary | ICD-10-CM | POA: Diagnosis not present

## 2018-01-01 ENCOUNTER — Other Ambulatory Visit: Payer: Self-pay | Admitting: Family Medicine

## 2018-01-01 DIAGNOSIS — G8929 Other chronic pain: Secondary | ICD-10-CM

## 2018-01-01 DIAGNOSIS — M542 Cervicalgia: Secondary | ICD-10-CM

## 2018-01-01 DIAGNOSIS — F325 Major depressive disorder, single episode, in full remission: Secondary | ICD-10-CM

## 2018-01-01 DIAGNOSIS — M549 Dorsalgia, unspecified: Principal | ICD-10-CM

## 2018-01-01 NOTE — Telephone Encounter (Signed)
Patient has not been seen since 2017 Rx denied; needs appointment

## 2018-02-02 ENCOUNTER — Ambulatory Visit: Payer: BLUE CROSS/BLUE SHIELD | Admitting: Family Medicine

## 2018-02-03 ENCOUNTER — Ambulatory Visit: Payer: BLUE CROSS/BLUE SHIELD | Admitting: Licensed Clinical Social Worker

## 2018-02-03 ENCOUNTER — Encounter: Payer: Self-pay | Admitting: Psychiatry

## 2018-02-03 ENCOUNTER — Ambulatory Visit (INDEPENDENT_AMBULATORY_CARE_PROVIDER_SITE_OTHER): Payer: BLUE CROSS/BLUE SHIELD | Admitting: Psychiatry

## 2018-02-03 ENCOUNTER — Other Ambulatory Visit: Payer: Self-pay

## 2018-02-03 VITALS — BP 144/86 | HR 88 | Temp 98.4°F | Wt 189.0 lb

## 2018-02-03 DIAGNOSIS — F331 Major depressive disorder, recurrent, moderate: Secondary | ICD-10-CM

## 2018-02-03 DIAGNOSIS — G894 Chronic pain syndrome: Secondary | ICD-10-CM | POA: Diagnosis not present

## 2018-02-03 MED ORDER — HYDROXYZINE PAMOATE 25 MG PO CAPS: 25.0000 mg | ORAL_CAPSULE | Freq: Three times a day (TID) | ORAL | 1 refills | Status: DC | PRN

## 2018-02-03 MED ORDER — DULOXETINE HCL 60 MG PO CPEP: 60.0000 mg | ORAL_CAPSULE | Freq: Every day | ORAL | 0 refills | Status: DC

## 2018-02-03 MED ORDER — ZOLPIDEM TARTRATE 5 MG PO TABS: 5.0000 mg | ORAL_TABLET | Freq: Every evening | ORAL | 1 refills | Status: DC | PRN
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
## 2018-02-05 ENCOUNTER — Ambulatory Visit: Payer: BLUE CROSS/BLUE SHIELD | Admitting: Psychiatry

## 2018-02-23 ENCOUNTER — Ambulatory Visit (INDEPENDENT_AMBULATORY_CARE_PROVIDER_SITE_OTHER): Payer: BLUE CROSS/BLUE SHIELD | Admitting: Licensed Clinical Social Worker

## 2018-02-23 DIAGNOSIS — F331 Major depressive disorder, recurrent, moderate: Secondary | ICD-10-CM

## 2018-02-27 ENCOUNTER — Encounter: Payer: Self-pay | Admitting: Psychiatry

## 2018-02-27 ENCOUNTER — Ambulatory Visit: Payer: BLUE CROSS/BLUE SHIELD | Admitting: Psychiatry

## 2018-02-27 ENCOUNTER — Other Ambulatory Visit: Payer: Self-pay

## 2018-02-27 VITALS — BP 145/87 | HR 83 | Wt 187.0 lb

## 2018-02-27 DIAGNOSIS — F331 Major depressive disorder, recurrent, moderate: Secondary | ICD-10-CM | POA: Diagnosis not present

## 2018-02-27 DIAGNOSIS — G894 Chronic pain syndrome: Secondary | ICD-10-CM | POA: Diagnosis not present

## 2018-02-27 MED ORDER — QUETIAPINE FUMARATE 25 MG PO TABS: 25.0000 mg | ORAL_TABLET | Freq: Every day | ORAL | 1 refills | Status: DC

## 2018-02-27 MED ORDER — HYDROXYZINE HCL 10 MG PO TABS: 10.0000 mg | ORAL_TABLET | Freq: Three times a day (TID) | ORAL | 1 refills | Status: DC | PRN
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
## 2018-02-27 NOTE — Patient Instructions (Signed)
Quetiapine tablets What is this medicine? QUETIAPINE (kwe TYE a peen) is an antipsychotic. It is used to treat schizophrenia and bipolar disorder, also known as manic-depression. This medicine may be used for other purposes; ask your health care provider or pharmacist if you have questions. COMMON BRAND NAME(S): Seroquel What should I tell my health care provider before I take this medicine? They need to know if you have any of these conditions: -brain tumor or head injury -breast cancer -cataracts -diabetes -difficulty swallowing -heart disease -kidney disease -liver disease -low blood counts, like low white cell, platelet, or red cell counts -low blood pressure or dizziness when standing up -Parkinson's disease -previous heart attack -seizures -suicidal thoughts, plans, or attempt by you or a family member -thyroid disease -an unusual or allergic reaction to quetiapine, other medicines, foods, dyes, or preservatives -pregnant or trying to get pregnant -breast-feeding How should I use this medicine? Take this medicine by mouth. Swallow it with a drink of water. Follow the directions on the prescription label. If it upsets your stomach you can take it with food. Take your medicine at regular intervals. Do not take it more often than directed. Do not stop taking except on the advice of your doctor or health care professional. A special MedGuide will be given to you by the pharmacist with each prescription and refill. Be sure to read this information carefully each time. Talk to your pediatrician regarding the use of this medicine in children. While this drug may be prescribed for children as young as 10 years for selected conditions, precautions do apply. Patients over age 65 years may have a stronger reaction to this medicine and need smaller doses. Overdosage: If you think you have taken too much of this medicine contact a poison control center or emergency room at once. NOTE: This  medicine is only for you. Do not share this medicine with others. What if I miss a dose? If you miss a dose, take it as soon as you can. If it is almost time for your next dose, take only that dose. Do not take double or extra doses. What may interact with this medicine? Do not take this medicine with any of the following medications: -certain medicines for fungal infections like fluconazole, itraconazole, ketoconazole, posaconazole, voriconazole -cisapride -dofetilide -dronedarone -droperidol -grepafloxacin -halofantrine -phenothiazines like chlorpromazine, mesoridazine, thioridazine -pimozide -sparfloxacin -ziprasidone This medicine may also interact with the following medications: -alcohol -antiviral medicines for HIV or AIDS -certain medicines for blood pressure -certain medicines for depression, anxiety, or psychotic disturbances like haloperidol, lorazepam -certain medicines for diabetes -certain medicines for Parkinson's disease -certain medicines for seizures like carbamazepine, phenobarbital, phenytoin -cimetidine -erythromycin -other medicines that prolong the QT interval (cause an abnormal heart rhythm) -rifampin -steroid medicines like prednisone or cortisone This list may not describe all possible interactions. Give your health care provider a list of all the medicines, herbs, non-prescription drugs, or dietary supplements you use. Also tell them if you smoke, drink alcohol, or use illegal drugs. Some items may interact with your medicine. What should I watch for while using this medicine? Visit your doctor or health care professional for regular checks on your progress. It may be several weeks before you see the full effects of this medicine. Your health care provider may suggest that you have your eyes examined prior to starting this medicine, and every 6 months thereafter. If you have been taking this medicine regularly for some time, do not suddenly stop taking it.  You must gradually   reduce the dose or your symptoms may get worse. Ask your doctor or health care professional for advice. Patients and their families should watch out for worsening depression or thoughts of suicide. Also watch out for sudden or severe changes in feelings such as feeling anxious, agitated, panicky, irritable, hostile, aggressive, impulsive, severely restless, overly excited and hyperactive, or not being able to sleep. If this happens, especially at the beginning of antidepressant treatment or after a change in dose, call your health care professional. You may get dizzy or drowsy. Do not drive, use machinery, or do anything that needs mental alertness until you know how this medicine affects you. Do not stand or sit up quickly, especially if you are an older patient. This reduces the risk of dizzy or fainting spells. Alcohol can increase dizziness and drowsiness. Avoid alcoholic drinks. Do not treat yourself for colds, diarrhea or allergies. Ask your doctor or health care professional for advice, some ingredients may increase possible side effects. This medicine can reduce the response of your body to heat or cold. Dress warm in cold weather and stay hydrated in hot weather. If possible, avoid extreme temperatures like saunas, hot tubs, very hot or cold showers, or activities that can cause dehydration such as vigorous exercise. What side effects may I notice from receiving this medicine? Side effects that you should report to your doctor or health care professional as soon as possible: -allergic reactions like skin rash, itching or hives, swelling of the face, lips, or tongue -difficulty swallowing -fast or irregular heartbeat -fever or chills, sore throat -fever with rash, swollen lymph nodes, or swelling of the face -increased hunger or thirst -increased urination -problems with balance, talking, walking -seizures -stiff muscles -suicidal thoughts or other mood  changes -uncontrollable head, mouth, neck, arm, or leg movements -unusually weak or tired Side effects that usually do not require medical attention (report to your doctor or health care professional if they continue or are bothersome): -change in sex drive or performance -constipation -drowsy or dizzy -dry mouth -stomach upset -weight gain This list may not describe all possible side effects. Call your doctor for medical advice about side effects. You may report side effects to FDA at 1-800-FDA-1088. Where should I keep my medicine? Keep out of the reach of children. Store at room temperature between 15 and 30 degrees C (59 and 86 degrees F). Throw away any unused medicine after the expiration date. NOTE: This sheet is a summary. It may not cover all possible information. If you have questions about this medicine, talk to your doctor, pharmacist, or health care provider.  2018 Elsevier/Gold Standard (2015-06-20 13:07:35)  

## 2018-02-27 NOTE — Progress Notes (Signed)
  THERAPIST PROGRESS NOTE   Date of Service:   02/23/2018  Session Time:   1hour  Patient:   Jamie [name]   DOB:   Jun 20, 1966  MR Number:  [id number]  Location:  [facility] [place] [address] Dept: [phone]            Provider/Observer:  Lubertha South Counselor  Risk of Suicide/Violence: virtually non-existent   Diagnosis:    MDD (major depressive disorder), recurrent episode, moderate ([place])  Type of Therapy: Individual Therapy  Treatment Goals addressed: Coping  Participation Level: Active  Interventions: CBT and Motivational Interviewing  Behavioral Response: CasualAlertDepressed   Summary: Therapist met with Patient in an outpatient setting to assess current mood and assist with making progress towards his goals through the use of therapeutic intervention. Therapist did a brief mood check, assessing anger, fear, disgust, excitement, happiness, and sadness. Therapist provided active listening for Patient as she communicated her current stressors (husband infidelity).  Therapist explained the importance of establishing emotional support as well as ensuring that Patient is engaging in adequate self-care during these stress induced times. Therapist then assisted Patient with brainstorming to determine a solution for her current work problem(did not attend meeting & lack of connection with co workers). Therapist and Patient discussed possible financial supports, emotional supports, family, friends, etc.     Plan: Jamie [name] will use coping skills to reduce symptoms   Return again in 2 weeks.

## 2018-03-04 ENCOUNTER — Ambulatory Visit: Payer: BLUE CROSS/BLUE SHIELD | Admitting: Licensed Clinical Social Worker

## 2018-03-13 ENCOUNTER — Ambulatory Visit: Payer: BLUE CROSS/BLUE SHIELD | Admitting: Psychiatry

## 2018-03-19 ENCOUNTER — Ambulatory Visit: Payer: BLUE CROSS/BLUE SHIELD | Admitting: Licensed Clinical Social Worker

## 2018-03-27 ENCOUNTER — Other Ambulatory Visit: Payer: Self-pay

## 2018-03-27 ENCOUNTER — Ambulatory Visit: Payer: BLUE CROSS/BLUE SHIELD | Admitting: Psychiatry

## 2018-03-27 ENCOUNTER — Ambulatory Visit (INDEPENDENT_AMBULATORY_CARE_PROVIDER_SITE_OTHER): Payer: BLUE CROSS/BLUE SHIELD | Admitting: Advanced Practice Midwife

## 2018-03-27 ENCOUNTER — Encounter: Payer: Self-pay | Admitting: Advanced Practice Midwife

## 2018-03-27 ENCOUNTER — Encounter: Payer: Self-pay | Admitting: Psychiatry

## 2018-03-27 VITALS — BP 136/85 | HR 106 | Temp 98.6°F | Wt 186.6 lb

## 2018-03-27 VITALS — BP 154/104 | HR 100 | Ht 62.0 in | Wt 186.0 lb

## 2018-03-27 DIAGNOSIS — Z113 Encounter for screening for infections with a predominantly sexual mode of transmission: Secondary | ICD-10-CM

## 2018-03-27 DIAGNOSIS — Z01419 Encounter for gynecological examination (general) (routine) without abnormal findings: Secondary | ICD-10-CM

## 2018-03-27 DIAGNOSIS — F331 Major depressive disorder, recurrent, moderate: Secondary | ICD-10-CM

## 2018-03-27 DIAGNOSIS — T50905A Adverse effect of unspecified drugs, medicaments and biological substances, initial encounter: Secondary | ICD-10-CM | POA: Diagnosis not present

## 2018-03-27 DIAGNOSIS — Z124 Encounter for screening for malignant neoplasm of cervix: Secondary | ICD-10-CM | POA: Diagnosis not present

## 2018-03-27 DIAGNOSIS — Z1211 Encounter for screening for malignant neoplasm of colon: Secondary | ICD-10-CM

## 2018-03-27 DIAGNOSIS — Z1231 Encounter for screening mammogram for malignant neoplasm of breast: Secondary | ICD-10-CM

## 2018-03-27 DIAGNOSIS — G894 Chronic pain syndrome: Secondary | ICD-10-CM | POA: Diagnosis not present

## 2018-03-27 DIAGNOSIS — F5105 Insomnia due to other mental disorder: Secondary | ICD-10-CM | POA: Diagnosis not present

## 2018-03-27 DIAGNOSIS — Z1239 Encounter for other screening for malignant neoplasm of breast: Secondary | ICD-10-CM

## 2018-03-27 MED ORDER — DOXEPIN HCL 10 MG PO CAPS: 10.0000 mg | ORAL_CAPSULE | Freq: Every day | ORAL | 1 refills | Status: DC

## 2018-03-27 NOTE — Patient Instructions (Signed)
Doxepin capsules What is this medicine? DOXEPIN (DOX e pin) is used to treat depression and anxiety. This medicine may be used for other purposes; ask your health care provider or pharmacist if you have questions. COMMON BRAND NAME(S): Sinequan What should I tell my health care provider before I take this medicine? They need to know if you have any of these conditions: -bipolar disorder -difficulty passing urine -glaucoma -heart disease -if you frequently drink alcohol containing drinks -liver disease -lung or breathing disease, like asthma or sleep apnea -prostate trouble -schizophrenia -seizures -suicidal thoughts, plans, or attempt; a previous suicide attempt by you or a family member -an unusual or allergic reaction to doxepin, other medicines, foods, dyes, or preservatives -pregnant or trying to get pregnant -breast-feeding How should I use this medicine? Take this medicine by mouth with a glass of water. Follow the directions on the prescription label. Take your doses at regular intervals. Do not take your medicine more often than directed. Do not stop taking this medicine suddenly except upon the advice of your doctor. Stopping this medicine too quickly may cause serious side effects or your condition may worsen. A special MedGuide will be given to you by the pharmacist with each prescription and refill. Be sure to read this information carefully each time. Talk to your pediatrician regarding the use of this medicine in children. While this drug may be prescribed for children as young as 12 years for selected conditions, precautions do apply. Overdosage: If you think you have taken too much of this medicine contact a poison control center or emergency room at once. NOTE: This medicine is only for you. Do not share this medicine with others. What if I miss a dose? If you miss a dose, take it as soon as you can. If it is almost time for your next dose, take only that dose. Do not  take double or extra doses. What may interact with this medicine? Do not take this medicine with any of the following medications: -arsenic trioxide -certain medicines used to regulate abnormal heartbeat or to treat other heart conditions -cisapride -halofantrine -levomethadyl -linezolid -MAOIs like Carbex, Eldepryl, Marplan, Nardil, and Parnate -methylene blue -other medicines for mental depression -phenothiazines like perphenazine, thioridazine and chlorpromazine -pimozide -procarbazine -sparfloxacin -St. John's Wort -ziprasidone This medicine may also interact with the following medications: -cimetidine -tolazamide This list may not describe all possible interactions. Give your health care provider a list of all the medicines, herbs, non-prescription drugs, or dietary supplements you use. Also tell them if you smoke, drink alcohol, or use illegal drugs. Some items may interact with your medicine. What should I watch for while using this medicine? Visit your doctor or health care professional for regular checks on your progress. It can take several days before you feel the full effect of this medicine. If you have been taking this medicine regularly for some time, do not suddenly stop taking it. You must gradually reduce the dose or you may get severe side effects. Ask your doctor or health care professional for advice. Even after you stop taking this medicine it can still affect your body for several days. Patients and their families should watch out for new or worsening thoughts of suicide or depression. Also watch out for sudden changes in feelings such as feeling anxious, agitated, panicky, irritable, hostile, aggressive, impulsive, severely restless, overly excited and hyperactive, or not being able to sleep. If this happens, especially at the beginning of treatment or after a change in dose,   call your health care professional. You may get drowsy or dizzy. Do not drive, use machinery,  or do anything that needs mental alertness until you know how this medicine affects you. Do not stand or sit up quickly, especially if you are an older patient. This reduces the risk of dizzy or fainting spells. Alcohol may increase dizziness and drowsiness. Avoid alcoholic drinks. Do not treat yourself for coughs, colds, or allergies without asking your doctor or health care professional for advice. Some ingredients can increase possible side effects. Your mouth may get dry. Chewing sugarless gum or sucking hard candy, and drinking plenty of water may help. Contact your doctor if the problem does not go away or is severe. This medicine may cause dry eyes and blurred vision. If you wear contact lenses you may feel some discomfort. Lubricating drops may help. See your eye doctor if the problem does not go away or is severe. This medicine can make you more sensitive to the sun. Keep out of the sun. If you cannot avoid being in the sun, wear protective clothing and use sunscreen. Do not use sun lamps or tanning beds/booths. What side effects may I notice from receiving this medicine? Side effects that you should report to your doctor or health care professional as soon as possible: -allergic reactions like skin rash, itching or hives, swelling of the face, lips, or tongue -anxious -breathing problems -changes in vision -confusion -elevated mood, decreased need for sleep, racing thoughts, impulsive behavior -eye pain -fast, irregular heartbeat -feeling faint or lightheaded, falls -feeling agitated, angry, or irritable -fever with increased sweating -hallucination, loss of contact with reality -seizures -stiff muscles -suicidal thoughts or other mood changes -tingling, pain, or numbness in the feet or hands -trouble passing urine or change in the amount of urine -trouble sleeping -unusually weak or tired -vomiting -yellowing of the eyes or skin Side effects that usually do not require medical  attention (report to your doctor or health care professional if they continue or are bothersome): -change in sex drive or performance -change in appetite or weight -constipation -dizziness -dry mouth -nausea -tired -tremors -upset stomach This list may not describe all possible side effects. Call your doctor for medical advice about side effects. You may report side effects to FDA at 1-800-FDA-1088. Where should I keep my medicine? Keep out of the reach of children. Store at room temperature between 15 and 30 degrees C (59 and 86 degrees F). Throw away any unused medicine after the expiration date. NOTE: This sheet is a summary. It may not cover all possible information. If you have questions about this medicine, talk to your doctor, pharmacist, or health care provider.  2018 Elsevier/Gold Standard (2016-05-17 12:35:05)  

## 2018-03-27 NOTE — Progress Notes (Signed)
[facility] MD OP Progress Note  03/27/2018 1:04 PM Jamie [name]  MRN:  [id number]  Chief Complaint: ' I am better." Chief Complaint    Follow-up; Medication Refill     HPI: Jamie [name] is a 52 year-old Caucasian female married, lives in [place], employed, has a history of depression, chronic pain, presented to the clinic today for a follow-up visit.  Patient today reports she feels better than she used to before.  She reports the medications may be helping with her mood symptoms.  She continues to struggle with relationship issues with her husband.  She reports she is working with her therapist on the same.  She reports her husband is not ready for family therapy at this time.  Patient reports she tried the Seroquel which was prescribed last visit.  She reports it made her more sluggish and she did not like the effects of it.  She reports she hence does not want to be continued on the same.  She reports she continues to struggle with sleep.  She would like to be tried on a new medication for the same.  Discussed adding doxepin for the same.  Provided medication education.  She agrees with plan.  Patient continues to struggle with chronic pain.  She reports she used to be on pain medications in the past.  She reports she does not want to be on any narcotic medications.  She however would like to get help with her pain and is hoping to see a pain provider soon.  Provided education about how pain can affect her mood as well as her sleep.  Patient voices understanding.   Patient denies any other side effects to the medications at this time.  She continues to be compliant on her Cymbalta.  Visit Diagnosis:    ICD-10-CM   1. MDD (major depressive disorder), recurrent episode, moderate (HCC) F33.1 doxepin (SINEQUAN) 10 MG capsule  2. Chronic pain syndrome G89.4   3. Adverse effect of drug, initial encounter T50.905A   4. Insomnia due to mental condition F51.05     Past Psychiatric History: History of  depression since the past several years.  She reports she also struggled with postpartum depression in the past.  She reports she used to see Dr.Clapac past.  She reports she tried several medications including Abilify in the past.  She does not remember all the names.  She reports the only medication that actually worked for her was the Cymbalta.  She has been noncompliant with Cymbalta in the past because she did not have a provider to refill it for her.  She denies any suicide attempt in the past.  She denies any inpatient mental health admissions.  Past trials of Abilify, gabapentin, Ambien.  Past Medical History:  Past Medical History:  Diagnosis Date  . Anxiety   . Brachial neuritis   . Chronic neck pain   . Chronic pain not due to malignancy   . Depressive disorder    followed by Dr. Alethia Berthold.  . Disturbance, sleep   . Elevated blood pressure reading without diagnosis of hypertension   . Embolism and thrombosis of splenic artery   . H/O thrombophlebitis   . Infarction of spleen   . Lumbosacral neuritis   . Obesity, Class II, BMI 35-39.9, with comorbidity     Past Surgical History:  Procedure Laterality Date  . CESAREAN SECTION  09/24/2010  . TONSILLECTOMY AND ADENOIDECTOMY  09/24/2010  . TUBAL LIGATION  09/24/2010    Family  Psychiatric History: Two of her maternal uncles committed suicide.  Her mother may have mental illness but not formally diagnosed.  She reports her brother is an alcoholic.  Family History:  Family History  Problem Relation Age of Onset  . Anxiety disorder Mother   . Depression Mother   . Alcohol abuse Brother   Substance abuse history: Denies   Social History: She reports she was raised mostly by her mother and her stepdad and also by her maternal grandmother as a child.  She is currently married.  She and her husband have 3 children aged 57,65 and 76 years old.  She also has a 16-month-old grandchild from her 17 year old son.  She reports she was  disowned by her family 31 years ago since she married her husband who is an African-American.  She reports her parents did not agree with that.  She works at [employer].  She reports she loves her job.  Her husband drives the [employer] transit bus. Social History   Socioeconomic History  . Marital status: Married    Spouse name: rodney  . Number of children: 3  . Years of education: Not on file  . Highest education level: Some college, no degree  Occupational History    Comment: full time  Social Needs  . Financial resource strain: Somewhat hard  . Food insecurity:    Worry: Sometimes true    Inability: Sometimes true  . Transportation needs:    Medical: No    Non-medical: No  Tobacco Use  . Smoking status: Never Smoker  . Smokeless tobacco: Never Used  Substance and Sexual Activity  . Alcohol use: Yes    Alcohol/week: 0.0 oz    Comment: social  . Drug use: Yes    Types: Marijuana    Comment: 6 mths ago  . Sexual activity: Yes    Partners: Male  Lifestyle  . Physical activity:    Days per week: 0 days    Minutes per session: 0 min  . Stress: Very much  Relationships  . Social connections:    Talks on phone: More than three times a week    Gets together: Never    Attends religious service: Never    Active member of club or organization: No    Attends meetings of clubs or organizations: Never    Relationship status: Married  Other Topics Concern  . Not on file  Social History Narrative  . Not on file    Allergies:  Allergies  Allergen Reactions  . Neuromuscular Blocking Agents   . Hydrocodone-Acetaminophen Nausea Only    Metabolic Disorder Labs: No results found for: HGBA1C, MPG No results found for: PROLACTIN No results found for: CHOL, TRIG, HDL, CHOLHDL, VLDL, LDLCALC No results found for: TSH  Therapeutic Level Labs: No results found for: LITHIUM No results found for: VALPROATE No components found for:  CBMZ  Current  Medications: Current Outpatient Medications  Medication Sig Dispense Refill  . DULoxetine (CYMBALTA) 60 MG capsule Take 1 capsule (60 mg total) by mouth daily. 90 capsule 0  . hydrOXYzine (ATARAX/VISTARIL) 10 MG tablet Take 1-2 tablets (10-20 mg total) by mouth 3 (three) times daily as needed (SEVERE ANXIETY SX). 180 tablet 1  . meloxicam (MOBIC) 15 MG tablet Take 1 tablet (15 mg total) by mouth daily. Do not take any nonsteroidals (Aleve, ibuprofen, Motrin, Goody powders) 30 tablet 1  . doxepin (SINEQUAN) 10 MG capsule Take 1 capsule (10 mg total) by  mouth at bedtime. 30 capsule 1   No current facility-administered medications for this visit.      Musculoskeletal: Strength & Muscle Tone: within normal limits Gait & Station: normal Patient leans: N/A  Psychiatric Specialty Exam: Review of Systems  Psychiatric/Behavioral: Positive for depression. The patient is nervous/anxious and has insomnia.   All other systems reviewed and are negative.   Blood pressure 136/85, pulse (!) 106, temperature 98.6 F (37 C), temperature source Oral, weight 186 lb 9.6 oz (84.6 kg).Body mass index is 34.13 kg/m.  General Appearance: Casual  Eye Contact:  Fair  Speech:  Clear and Coherent  Volume:  Normal  Mood:  Anxious and Dysphoric  Affect:  Appropriate  Thought Process:  Goal Directed and Descriptions of Associations: Intact  Orientation:  Full (Time, Place, and Person)  Thought Content: Logical   Suicidal Thoughts:  No  Homicidal Thoughts:  No  Memory:  Immediate;   Fair Recent;   Fair Remote;   Fair  Judgement:  Fair  Insight:  Fair  Psychomotor Activity:  Normal  Concentration:  Concentration: Fair and Attention Span: Fair  Recall:  [employer] of Knowledge: Fair  Language: Fair  Akathisia:  No  Handed:  Right  AIMS (if indicated): 0  Assets:  Communication Skills Desire for [place] Talents/Skills Transportation  ADL's:  Intact  Cognition: WNL   Sleep:  Poor   Screenings: PHQ2-9     Office Visit from 10/11/2016 in [facility] Office Visit from 06/26/2016 in [facility] Office Visit from 03/13/2016 in [facility] Office Visit from 11/14/2015 in [facility] Office Visit from 08/18/2015 in [facility]  PHQ-2 Total Score  2  1  1  2  1   PHQ-9 Total Score  8  -  -  8  -       Assessment and Plan: Vibha is a 52 year old Caucasian female who has a history of depression, chronic pain, relationship struggles who presented to the clinic today for a follow-up visit.  Patient continues to have psychosocial stressors of relationship issues with her husband.  She is however in psychotherapy with Ms. Peacock and it is going well.  Patient continues to have sleep problems.  Discussed medication changes with patient plan as noted below.  Plan For depression Continue Cymbalta 60 mg p.o. daily Discontinue Seroquel for side effects. Start doxepin 10 mg p.o. nightly. Continue CBT with Ms. Peacock  For insomnia Discontinue Seroquel for side effects. Start doxepin 10 milligrams p.o. nightly  For adverse effects to medications Discontinue Seroquel. Monitor closely.  For chronic pain syndrome She is scheduled to see a pain provider soon.  Continue CBT with Ms. Peacock.  Follow-up in clinic in 4 weeks or sooner if needed.  More than 50 % of the time was spent for psychoeducation and supportive psychotherapy and care coordination.  This note was generated in part or whole with voice recognition software. Voice recognition is usually quite accurate but there are transcription errors that can and very often do occur. I apologize for any typographical errors that were not detected and corrected.       Ursula Alert, MD 03/27/2018, 1:04 PM

## 2018-03-27 NOTE — Progress Notes (Signed)
Patient ID: [name], female   DOB: 09-23-1966, 52 y.o.   MRN: [id number]      Gynecology Annual Exam  PCP: Arnetha Courser, MD  Chief Complaint:  Chief Complaint  Patient presents with  . Gynecologic Exam    History of Present Illness:Patient is a 52 y.o. [id number] presents for annual exam. The patient has complaint today of recent pain with intercourse. She has ongoing anxiety and depression and is under the care of her psychiatrist. They are working towards correct medication and dosing. She admits some cold symptoms of nasal congestion, cough and sore throat.    The patient is aware of her elevated blood pressure and states she had a stressful childcare event just before the appointment.   LMP: Patient's last menstrual period was 02/18/2018 (approximate). Menarche:not applicable Average Interval: irregular, usually every 2 months  Duration of flow: 3 days Heavy Menses: no Clots: no Intermenstrual Bleeding: no Postcoital Bleeding: no Dysmenorrhea: no  The patient is sexually active. She admits recent dyspareunia.  The patient does occasionally perform self breast exams.  There is no notable family history of breast or ovarian cancer in her family.  The patient wears seatbelts: yes.   The patient has regular exercise: yes. She is active with her children and at her job. She reports trying to eat a healthy diet but states that is difficult sometimes due to her depression.   The patient reports current symptoms of depression.     Review of Systems: Review of Systems  Constitutional: Negative.   HENT: Positive for congestion and sore throat.   Eyes: Negative.   Respiratory: Positive for cough.   Cardiovascular: Negative.   Gastrointestinal: Negative.   Genitourinary: Negative.   Musculoskeletal: Positive for joint pain.  Skin: Negative.   Neurological: Negative.   Endo/Heme/Allergies: Negative.   Psychiatric/Behavioral: Positive for depression.    Past Medical  History:  Past Medical History:  Diagnosis Date  . Anxiety   . Brachial neuritis   . Chronic neck pain   . Chronic pain not due to malignancy   . Depressive disorder    followed by Dr. Alethia Berthold.  . Disturbance, sleep   . Elevated blood pressure reading without diagnosis of hypertension   . Embolism and thrombosis of splenic artery   . H/O thrombophlebitis   . Infarction of spleen   . Lumbosacral neuritis   . Obesity, Class II, BMI 35-39.9, with comorbidity     Past Surgical History:  Past Surgical History:  Procedure Laterality Date  . CESAREAN SECTION  09/24/2010  . TONSILLECTOMY AND ADENOIDECTOMY  09/24/2010  . TUBAL LIGATION  09/24/2010    Gynecologic History:  Patient's last menstrual period was 02/18/2018 (approximate). Last Pap: 2 years ago Results were:  no abnormalities  Last mammogram: unknown Results were: BI-RAD I  Obstetric History: [id number]  Family History:  Family History  Problem Relation Age of Onset  . Anxiety disorder Mother   . Depression Mother   . Alcohol abuse Brother     Social History:  Social History   Socioeconomic History  . Marital status: Married    Spouse name: rodney  . Number of children: 3  . Years of education: Not on file  . Highest education level: Some college, no degree  Occupational History    Comment: full time  Social Needs  . Financial resource strain: Somewhat hard  . Food insecurity:    Worry: Sometimes true    Inability: Sometimes true  .  Transportation needs:    Medical: No    Non-medical: No  Tobacco Use  . Smoking status: Never Smoker  . Smokeless tobacco: Never Used  Substance and Sexual Activity  . Alcohol use: Yes    Alcohol/week: 0.0 oz    Comment: social  . Drug use: Yes    Types: Marijuana    Comment: 6 mths ago  . Sexual activity: Yes    Partners: Male    Birth control/protection: Surgical    Comment: Tubal ligation   Lifestyle  . Physical activity:    Days per week: 0 days     Minutes per session: 0 min  . Stress: Very much  Relationships  . Social connections:    Talks on phone: More than three times a week    Gets together: Never    Attends religious service: Never    Active member of club or organization: No    Attends meetings of clubs or organizations: Never    Relationship status: Married  . Intimate partner violence:    Fear of current or ex partner: No    Emotionally abused: No    Physically abused: No    Forced sexual activity: No  Other Topics Concern  . Not on file  Social History Narrative  . Not on file    Allergies:  Allergies  Allergen Reactions  . Neuromuscular Blocking Agents   . Hydrocodone-Acetaminophen Nausea Only    Medications: Prior to Admission medications   Medication Sig Start Date End Date Taking? Authorizing Provider  doxepin (SINEQUAN) 10 MG capsule Take 1 capsule (10 mg total) by mouth at bedtime. 03/27/18  Yes Ursula Alert, MD  DULoxetine (CYMBALTA) 60 MG capsule Take 1 capsule (60 mg total) by mouth daily. 02/03/18  Yes Ursula Alert, MD  ibuprofen (ADVIL,MOTRIN) 800 MG tablet Take 800 mg by mouth every 8 (eight) hours as needed.   Yes [provider]    Physical Exam Vitals: Blood pressure (!) 154/104, pulse 100, height 5\' 2"  (1.575 m), weight 186 lb (84.4 kg), last menstrual period 02/18/2018.  General: NAD HEENT: normocephalic, anicteric Thyroid: no enlargement, no palpable nodules Pulmonary: No increased work of breathing, CTAB Cardiovascular: RRR, distal pulses 2+ Breast: Breast symmetrical, no tenderness, no palpable nodules or masses, no skin or nipple retraction present, no nipple discharge.  No axillary or supraclavicular lymphadenopathy. Abdomen: NABS, soft, non-tender, non-distended.  Umbilicus without lesions.  No hepatomegaly, splenomegaly or masses palpable. No evidence of hernia  Genitourinary:  External: Normal external female genitalia.  Normal urethral meatus, normal Bartholin's and  Skene's glands.    Vagina: Normal vaginal mucosa, no evidence of prolapse.    Cervix: unable to visualize and difficult to obtain PAP specimen due to body habitus, no bleeding, no CMT on palpation  Uterus: Non-enlarged, mobile, normal contour.    Adnexa: ovaries non-enlarged, no adnexal masses  Rectal: deferred  Lymphatic: no evidence of inguinal lymphadenopathy Extremities: no edema, erythema, or tenderness Neurologic: Grossly intact Psychiatric: mood appropriate, affect full    Assessment: 52 y.o. G3P3003 routine annual exam  Plan: Problem List Items Addressed This Visit    None    Visit Diagnoses    Well woman exam with routine gynecological exam    -  Primary   Relevant Orders   IGP,CtNgTv,Apt HPV,rfx16/18,45   MM DIGITAL SCREENING BILATERAL   Screen for sexually transmitted diseases       Relevant Orders   IGP,CtNgTv,Apt HPV,rfx16/18,45   Cervical cancer screening  Relevant Orders   IGP,CtNgTv,Apt HPV,rfx16/18,45   Screening for breast cancer       Relevant Orders   MM DIGITAL SCREENING BILATERAL   Screen for colon cancer       Relevant Orders   Ambulatory referral to Gastroenterology      1) Mammogram - recommend yearly screening mammogram.  Mammogram Was ordered today  2) STI screening  was offered and accepted  3) ASCCP guidelines and rational discussed.  Patient opts for every 2-3 years screening interval  4) Osteoporosis  - per USPTF routine screening DEXA at age 28  Consider FDA-approved medical therapies in postmenopausal women and men aged 22 years and older, based on the following: a) A hip or vertebral (clinical or morphometric) fracture b) T-score ? -2.5 at the femoral neck or spine after appropriate evaluation to exclude secondary causes C) Low bone mass (T-score between -1.0 and -2.5 at the femoral neck or spine) and a 10-year probability of a hip fracture ? 3% or a 10-year probability of a major osteoporosis-related fracture ? 20% based on the  [place]-adapted WHO algorithm   5) Routine healthcare maintenance including cholesterol, diabetes screening discussed managed by PCP  6) Colonoscopy: referral sent today.  Screening recommended starting at age 74 for average risk individuals, age 66 for individuals deemed at increased risk (including African Americans) and recommended to continue until age 33.  For patient age 6-85 individualized approach is recommended.  Gold standard screening is via colonoscopy, Cologuard screening is an acceptable alternative for patient unwilling or unable to undergo colonoscopy.  "Colorectal cancer screening for average?risk adults: 2018 guideline update from the American Cancer Society"[place]: A Cancer Journal for Clinicians: May 28, 2017   7) Patient is encouraged to increase vigorous physical activity and to increase healthy diet  7) Return in 1 year (on 03/28/2019).   Rod Can, [place]

## 2018-03-27 NOTE — Patient Instructions (Signed)
Mediterranean Diet A Mediterranean diet refers to food and lifestyle choices that are based on the traditions of countries located on the [place]. This way of eating has been shown to help prevent certain conditions and improve outcomes for people who have chronic diseases, like kidney disease and heart disease. What are tips for following this plan? Lifestyle  Cook and eat meals together with your family, when possible.  Drink enough fluid to keep your urine clear or pale yellow.  Be physically active every day. This includes: ? Aerobic exercise like running or swimming. ? Leisure activities like gardening, walking, or housework.  Get 7-8 hours of sleep each night.  If recommended by your health care provider, drink red wine in moderation. This means 1 glass a day for nonpregnant women and 2 glasses a day for men. A glass of wine equals 5 oz (150 mL). Reading food labels  Check the serving size of packaged foods. For foods such as rice and pasta, the serving size refers to the amount of cooked product, not dry.  Check the total fat in packaged foods. Avoid foods that have saturated fat or trans fats.  Check the ingredients list for added sugars, such as corn syrup. Shopping  At the grocery store, buy most of your food from the areas near the walls of the store. This includes: ? Fresh fruits and vegetables (produce). ? Grains, beans, nuts, and seeds. Some of these may be available in unpackaged forms or large amounts (in bulk). ? Fresh seafood. ? Poultry and eggs. ? Low-fat dairy products.  Buy whole ingredients instead of prepackaged foods.  Buy fresh fruits and vegetables in-season from local farmers markets.  Buy frozen fruits and vegetables in resealable bags.  If you do not have access to quality fresh seafood, buy precooked frozen shrimp or canned fish, such as tuna, salmon, or sardines.  Buy small amounts of raw or cooked vegetables, salads, or olives from the  deli or salad bar at your store.  Stock your pantry so you always have certain foods on hand, such as olive oil, canned tuna, canned tomatoes, rice, pasta, and beans. Cooking  Cook foods with extra-virgin olive oil instead of using butter or other vegetable oils.  Have meat as a side dish, and have vegetables or grains as your main dish. This means having meat in small portions or adding small amounts of meat to foods like pasta or stew.  Use beans or vegetables instead of meat in common dishes like chili or lasagna.  Experiment with different cooking methods. Try roasting or broiling vegetables instead of steaming or sauteing them.  Add frozen vegetables to soups, stews, pasta, or rice.  Add nuts or seeds for added healthy fat at each meal. You can add these to yogurt, salads, or vegetable dishes.  Marinate fish or vegetables using olive oil, lemon juice, garlic, and fresh herbs. Meal planning  Plan to eat 1 vegetarian meal one day each week. Try to work up to 2 vegetarian meals, if possible.  Eat seafood 2 or more times a week.  Have healthy snacks readily available, such as: ? Vegetable sticks with hummus. ? Greek yogurt. ? Fruit and nut trail mix.  Eat balanced meals throughout the week. This includes: ? Fruit: 2-3 servings a day ? Vegetables: 4-5 servings a day ? Low-fat dairy: 2 servings a day ? Fish, poultry, or lean meat: 1 serving a day ? Beans and legumes: 2 or more servings a week ? Nuts   and seeds: 1-2 servings a day ? Whole grains: 6-8 servings a day ? Extra-virgin olive oil: 3-4 servings a day  Limit red meat and sweets to only a few servings a month What are my food choices?  Mediterranean diet ? Recommended ? Grains: Whole-grain pasta. Brown rice. Bulgar wheat. Polenta. Couscous. Whole-wheat bread. Oatmeal. Quinoa. ? Vegetables: Artichokes. Beets. Broccoli. Cabbage. Carrots. Eggplant. Green beans. Chard. Kale. Spinach. Onions. Leeks. Peas. Squash.  Tomatoes. Peppers. Radishes. ? Fruits: Apples. Apricots. Avocado. Berries. Bananas. Cherries. Dates. Figs. Grapes. Lemons. Melon. Oranges. Peaches. Plums. Pomegranate. ? Meats and other protein foods: Beans. Almonds. Sunflower seeds. Pine nuts. Peanuts. Cod. Salmon. Scallops. Shrimp. Tuna. Tilapia. Clams. Oysters. Eggs. ? Dairy: Low-fat milk. Cheese. Greek yogurt. ? Beverages: Water. Red wine. Herbal tea. ? Fats and oils: Extra virgin olive oil. Avocado oil. Grape seed oil. ? Sweets and desserts: Greek yogurt with honey. Baked apples. Poached pears. Trail mix. ? Seasoning and other foods: Basil. Cilantro. Coriander. Cumin. Mint. Parsley. Sage. Rosemary. Tarragon. Garlic. Oregano. Thyme. Pepper. Balsalmic vinegar. Tahini. Hummus. Tomato sauce. Olives. Mushrooms. ? Limit these ? Grains: Prepackaged pasta or rice dishes. Prepackaged cereal with added sugar. ? Vegetables: Deep fried potatoes (french fries). ? Fruits: Fruit canned in syrup. ? Meats and other protein foods: Beef. Pork. Lamb. Poultry with skin. Hot dogs. Bacon. ? Dairy: Ice cream. Sour cream. Whole milk. ? Beverages: Juice. Sugar-sweetened soft drinks. Beer. Liquor and spirits. ? Fats and oils: Butter. Canola oil. Vegetable oil. Beef fat (tallow). Lard. ? Sweets and desserts: Cookies. Cakes. Pies. Candy. ? Seasoning and other foods: Mayonnaise. Premade sauces and marinades. ? The items listed may not be a complete list. Talk with your dietitian about what dietary choices are right for you. Summary  The Mediterranean diet includes both food and lifestyle choices.  Eat a variety of fresh fruits and vegetables, beans, nuts, seeds, and whole grains.  Limit the amount of red meat and sweets that you eat.  Talk with your health care provider about whether it is safe for you to drink red wine in moderation. This means 1 glass a day for nonpregnant women and 2 glasses a day for men. A glass of wine equals 5 oz (150 mL). This information  is not intended to replace advice given to you by your health care provider. Make sure you discuss any questions you have with your health care provider. Document Released: 08/08/2016 Document Revised: 09/10/2016 Document Reviewed: 08/08/2016 Elsevier Interactive Patient Education  2018 Elsevier Inc. American Heart Association (AHA) Exercise Recommendation  Being physically active is important to prevent heart disease and stroke, the nation's No. 1and No. 5killers. To improve overall cardiovascular health, we suggest at least 150 minutes per week of moderate exercise or 75 minutes per week of vigorous exercise (or a combination of moderate and vigorous activity). Thirty minutes a day, five times a week is an easy goal to remember. You will also experience benefits even if you divide your time into two or three segments of 10 to 15 minutes per day.  For people who would benefit from lowering their blood pressure or cholesterol, we recommend 40 minutes of aerobic exercise of moderate to vigorous intensity three to four times a week to lower the risk for heart attack and stroke.  Physical activity is anything that makes you move your body and burn calories.  This includes things like climbing stairs or playing sports. Aerobic exercises benefit your heart, and include walking, jogging, swimming or biking. Strength and   stretching exercises are best for overall stamina and flexibility.  The simplest, positive change you can make to effectively improve your heart health is to start walking. It's enjoyable, free, easy, social and great exercise. A walking program is flexible and boasts high success rates because people can stick with it. It's easy for walking to become a regular and satisfying part of life.   For Overall Cardiovascular Health:  At least 30 minutes of moderate-intensity aerobic activity at least 5 days per week for a total of 150  OR   At least 25 minutes of vigorous aerobic activity  at least 3 days per week for a total of 75 minutes; or a combination of moderate- and vigorous-intensity aerobic activity  AND   Moderate- to high-intensity muscle-strengthening activity at least 2 days per week for additional health benefits.  For Lowering Blood Pressure and Cholesterol  An average 40 minutes of moderate- to vigorous-intensity aerobic activity 3 or 4 times per week  What if I can't make it to the time goal? Something is always better than nothing! And everyone has to start somewhere. Even if you've been sedentary for years, today is the day you can begin to make healthy changes in your life. If you don't think you'll make it for 30 or 40 minutes, set a reachable goal for today. You can work up toward your overall goal by increasing your time as you get stronger. Don't let all-or-nothing thinking rob you of doing what you can every day.  Source:http://www.heart.org

## 2018-04-01 ENCOUNTER — Encounter: Payer: Self-pay | Admitting: *Deleted

## 2018-04-01 LAB — IGP,CTNGTV,APT HPV,RFX16/18,45
CHLAMYDIA, NUC. ACID AMP: NEGATIVE
GONOCOCCUS, NUC. ACID AMP: NEGATIVE
HPV APTIMA: NEGATIVE
PAP Smear Comment: 0
TRICH VAG BY NAA: NEGATIVE

## 2018-04-16 ENCOUNTER — Ambulatory Visit (INDEPENDENT_AMBULATORY_CARE_PROVIDER_SITE_OTHER): Payer: BLUE CROSS/BLUE SHIELD | Admitting: Licensed Clinical Social Worker

## 2018-04-16 ENCOUNTER — Telehealth: Payer: Self-pay

## 2018-04-16 DIAGNOSIS — F331 Major depressive disorder, recurrent, moderate: Secondary | ICD-10-CM

## 2018-04-16 DIAGNOSIS — G894 Chronic pain syndrome: Secondary | ICD-10-CM

## 2018-04-16 MED ORDER — DULOXETINE HCL 60 MG PO CPEP: 60.0000 mg | ORAL_CAPSULE | Freq: Every day | ORAL | 0 refills | Status: DC

## 2018-04-16 NOTE — Telephone Encounter (Signed)
pt was here to see [place], lcsw but pt stated that she went on vacation and left her cymbalta and wonder if you would just call in enough to do until her next appt. 04-24-18

## 2018-04-16 NOTE — Telephone Encounter (Signed)
Sent cymbalta to pharmacy 

## 2018-04-24 ENCOUNTER — Other Ambulatory Visit: Payer: Self-pay | Admitting: Advanced Practice Midwife

## 2018-04-24 ENCOUNTER — Encounter: Payer: Self-pay | Admitting: Psychiatry

## 2018-04-24 ENCOUNTER — Other Ambulatory Visit: Payer: BLUE CROSS/BLUE SHIELD

## 2018-04-24 ENCOUNTER — Telehealth: Payer: Self-pay | Admitting: Advanced Practice Midwife

## 2018-04-24 ENCOUNTER — Other Ambulatory Visit: Payer: Self-pay

## 2018-04-24 ENCOUNTER — Ambulatory Visit (INDEPENDENT_AMBULATORY_CARE_PROVIDER_SITE_OTHER): Payer: BLUE CROSS/BLUE SHIELD | Admitting: Psychiatry

## 2018-04-24 VITALS — BP 136/89 | HR 81 | Temp 98.6°F | Wt 192.0 lb

## 2018-04-24 DIAGNOSIS — Z113 Encounter for screening for infections with a predominantly sexual mode of transmission: Secondary | ICD-10-CM

## 2018-04-24 DIAGNOSIS — F331 Major depressive disorder, recurrent, moderate: Secondary | ICD-10-CM | POA: Diagnosis not present

## 2018-04-24 DIAGNOSIS — F5105 Insomnia due to other mental disorder: Secondary | ICD-10-CM

## 2018-04-24 DIAGNOSIS — G894 Chronic pain syndrome: Secondary | ICD-10-CM

## 2018-04-24 MED ORDER — DOXEPIN HCL 10 MG PO CAPS: 10.0000 mg | ORAL_CAPSULE | Freq: Every day | ORAL | 1 refills | Status: DC

## 2018-04-24 MED ORDER — DULOXETINE HCL 60 MG PO CPEP: 60.0000 mg | ORAL_CAPSULE | Freq: Every day | ORAL | 1 refills | Status: DC

## 2018-04-24 MED ORDER — DULOXETINE HCL 20 MG PO CPEP: 20.0000 mg | ORAL_CAPSULE | Freq: Every day | ORAL | 1 refills | Status: DC
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
## 2018-04-24 NOTE — Progress Notes (Signed)
Aptima done at recent annual exam. STD blood panel ordered per patient request.

## 2018-04-24 NOTE — Telephone Encounter (Signed)
I don't think she needs an appointment to talk about her normal labs.

## 2018-04-24 NOTE — Telephone Encounter (Signed)
Patient is calling about labs. Patient states she isn't sure she about what labs she has had done. I have patient on schedule for today if she needs to come in. Please call patient .

## 2018-04-24 NOTE — Telephone Encounter (Signed)
-----   Message from [place], Generic sent at 04/23/2018 11:24 AM EDT -----    Appointment Request From: [name]    With Provider: Rod Can, CNM [[facility]]    Preferred Date Range: 04/24/2018 - 04/24/2018    Preferred Times: Friday Morning, Friday Afternoon    Reason for visit: Office Visit    Comments:  blood work   Called and left message for patient to call back to be schedule

## 2018-04-25 LAB — RPR QUALITATIVE: RPR Ser Ql: NONREACTIVE

## 2018-04-25 LAB — HEPATITIS C ANTIBODY

## 2018-04-25 LAB — HIV ANTIBODY (ROUTINE TESTING W REFLEX): HIV Screen 4th Generation wRfx: NONREACTIVE

## 2018-04-25 LAB — HEPATITIS B SURFACE ANTIBODY,QUALITATIVE: Hep B Surface Ab, Qual: NONREACTIVE

## 2018-04-29 NOTE — Progress Notes (Signed)
  THERAPIST PROGRESS NOTE   Date of Service:   04/16/2018  Session Time:   1 hour  Patient:   Jamie [name]   DOB:   Nov 30, 1966  MR Number:  [id number]  Location:  [facility] [place] [address] Dept: [phone]            Provider/Observer:  Lubertha South Counselor  Risk of Suicide/Violence: virtually non-existent   Diagnosis:    MDD (major depressive disorder), recurrent episode, moderate ([place])  Type of Therapy: Individual Therapy  Treatment Goals addressed: Coping  Participation Level: Active    Behavioral Observation: Jamie [name]  presents as a 52 y.o.-year-old  Interventions: CBT and Motivational Interviewing   Behavioral Response: CasualAlertDepressed   Summary:Explored stressors in her life at work and home.  Reports that she continues to have difficulty with trusting her husband.  Reports that she is unsure if she wants to continue the relationship.  Explored life with husband and without him.  Patient reports that she wants him to be honest and tell her what happened.  Discussion on her needs and wants.  Assisted Patient with letting go.   Plan: Jamie [name] will journal her needs and wants.   Return again in 2 weeks.

## 2018-05-15 ENCOUNTER — Encounter: Payer: Self-pay | Admitting: Advanced Practice Midwife

## 2018-05-18 ENCOUNTER — Other Ambulatory Visit: Payer: Self-pay | Admitting: Advanced Practice Midwife

## 2018-05-18 ENCOUNTER — Encounter: Payer: Self-pay | Admitting: Advanced Practice Midwife

## 2018-05-18 DIAGNOSIS — Z113 Encounter for screening for infections with a predominantly sexual mode of transmission: Secondary | ICD-10-CM

## 2018-05-18 NOTE — Progress Notes (Signed)
Lab order for HSV2 placed for future lab visit.

## 2018-05-22 ENCOUNTER — Other Ambulatory Visit: Payer: BLUE CROSS/BLUE SHIELD

## 2018-05-22 ENCOUNTER — Ambulatory Visit: Payer: BLUE CROSS/BLUE SHIELD | Admitting: Psychiatry

## 2018-05-22 ENCOUNTER — Other Ambulatory Visit: Payer: Self-pay | Admitting: Advanced Practice Midwife

## 2018-05-22 DIAGNOSIS — Z113 Encounter for screening for infections with a predominantly sexual mode of transmission: Secondary | ICD-10-CM | POA: Diagnosis not present

## 2018-05-23 LAB — HSV 2 ANTIBODY, IGG: HSV 2 IGG, TYPE SPEC: 11.7 {index} — AB (ref 0.00–0.90)

## 2018-06-01 ENCOUNTER — Ambulatory Visit: Payer: BLUE CROSS/BLUE SHIELD | Admitting: Licensed Clinical Social Worker

## 2018-06-01 DIAGNOSIS — F331 Major depressive disorder, recurrent, moderate: Secondary | ICD-10-CM | POA: Diagnosis not present

## 2018-06-14 DIAGNOSIS — S63502A Unspecified sprain of left wrist, initial encounter: Secondary | ICD-10-CM | POA: Diagnosis not present

## 2018-06-14 DIAGNOSIS — S60212A Contusion of left wrist, initial encounter: Secondary | ICD-10-CM | POA: Diagnosis not present

## 2018-06-16 DIAGNOSIS — S66912A Strain of unspecified muscle, fascia and tendon at wrist and hand level, left hand, initial encounter: Secondary | ICD-10-CM | POA: Insufficient documentation

## 2018-07-03 DIAGNOSIS — S63522A Sprain of radiocarpal joint of left wrist, initial encounter: Secondary | ICD-10-CM | POA: Diagnosis not present

## 2018-07-04 NOTE — Progress Notes (Signed)
   THERAPIST PROGRESS NOTE  Session Time: 32mn  Participation Level: Active  Behavioral Response: CasualAlertEuthymic  Type of Therapy: Individual Therapy  Treatment Goals addressed: Coping  Interventions: CBT and Motivational Interviewing  Summary: Jamie FIORENZAis a 52y.o. female who presents with continued symptoms of her diagnosis.  Therapist met with Patient in an outpatient setting to assess current mood and assist with making progress towards goals through the use of therapeutic intervention. Therapist did a brief mood check, assessing anger, fear, disgust, excitement, happiness, and sadness.  Patient reports that she has been sad lately.  Patient reports that she does not feel "apart of the clique" while at work.  SHe reports being outcasted.  Patient reports that she continues to work on the relationship that she has with her husband. Explored how she is able to use self care.  Explored coping skills and ensuing the she uses them daily.  Inadequate pattern of communication with partner, family members, employer leading to frequent arguing and conflicts.      Suicidal/Homicidal: No  Plan: Return again in 2 weeks.  Diagnosis: Axis I: Depression    Axis II: No diagnosis    NLubertha South LCSW 06/02/2018

## 2018-07-21 ENCOUNTER — Encounter: Payer: Self-pay | Admitting: Advanced Practice Midwife

## 2018-07-23 ENCOUNTER — Telehealth: Payer: Self-pay | Admitting: Advanced Practice Midwife

## 2018-07-23 NOTE — Telephone Encounter (Signed)
-----   Message from [place], Generic sent at 07/21/2018 3:26 PM EDT -----    Appointment Request From: [name]    With Provider: Rod Can, CNM [[facility]]    Preferred Date Range: 07/31/2018 - 07/31/2018    Preferred Times: Any time    Reason for visit: Office Visit    Comments:  Test results & treat for this condition would like a doctor that will help me understand the next steps to start treament.   Called and left voicemail for patient to call back be schedule. Need more information

## 2018-07-24 NOTE — Telephone Encounter (Signed)
Called and left voice mail for patient to call back to be schedule °

## 2018-08-11 ENCOUNTER — Ambulatory Visit: Payer: BLUE CROSS/BLUE SHIELD | Admitting: Obstetrics and Gynecology

## 2018-08-19 ENCOUNTER — Encounter: Payer: Self-pay | Admitting: Obstetrics and Gynecology

## 2018-08-19 ENCOUNTER — Ambulatory Visit (INDEPENDENT_AMBULATORY_CARE_PROVIDER_SITE_OTHER): Payer: BLUE CROSS/BLUE SHIELD | Admitting: Obstetrics and Gynecology

## 2018-08-19 VITALS — BP 144/100 | HR 86 | Ht 62.0 in | Wt 196.0 lb

## 2018-08-19 DIAGNOSIS — N952 Postmenopausal atrophic vaginitis: Secondary | ICD-10-CM

## 2018-08-19 DIAGNOSIS — Z113 Encounter for screening for infections with a predominantly sexual mode of transmission: Secondary | ICD-10-CM

## 2018-08-19 DIAGNOSIS — R768 Other specified abnormal immunological findings in serum: Secondary | ICD-10-CM | POA: Diagnosis not present

## 2018-08-19 MED ORDER — ESTROGENS, CONJUGATED 0.625 MG/GM VA CREA: TOPICAL_CREAM | VAGINAL | 0 refills | Status: DC

## 2018-08-19 NOTE — Progress Notes (Signed)
Obstetrics & Gynecology Office Visit   Chief Complaint:  Chief Complaint  Patient presents with  . vaginal irritation    tingling on outside/rash in front    History of Present Illness: Ms. Jamie [name] is a 52 y.o. [phone] who LMP was No LMP recorded. (Menstrual status: Irregular Periods)., presents today for a problem visit.   Patient complains of vulvar irritation and tingling. Was presumptively treated for HSV in past but based on serologies not cultures.  She denies recent antibiotic exposure, denies changes in soaps, detergents coinciding with the onset of her symptoms.  She has not previously self treated or been under treatment by another provider for these symptoms. She underwent full STI screening in May of this year.   No  Abnormal vaginal discharge, no abnormal uterine bleeding to coincide with symptoms.  No systemic symptoms such as fevers or chills.  No history of diabetes  Review of Systems: Review of Systems  Constitutional: Negative.   Gastrointestinal: Positive for abdominal pain. Negative for blood in stool, constipation, diarrhea, heartburn, melena, nausea and vomiting.  Genitourinary: Negative.   Skin: Negative.   Endo/Heme/Allergies: Negative for polydipsia.     Past Medical History:  Past Medical History:  Diagnosis [name]  . Anxiety   . Brachial neuritis   . Chronic neck pain   . Chronic pain not due to malignancy   . Depressive disorder    followed by Dr. Alethia Berthold.  . Disturbance, sleep   . Elevated blood pressure reading without diagnosis of hypertension   . Embolism and thrombosis of splenic artery   . H/O thrombophlebitis   . Infarction of spleen   . Lumbosacral neuritis   . Obesity, Class II, BMI 35-39.9, with comorbidity     Past Surgical History:  Past Surgical History:  Procedure Laterality [name]  . CESAREAN SECTION  09/24/2010  . TONSILLECTOMY AND ADENOIDECTOMY  09/24/2010  . TUBAL LIGATION  09/24/2010    Gynecologic History:  No LMP recorded. (Menstrual status: Irregular Periods).  Obstetric History: [id number]  Family History:  Family History  Problem Relation Age of Onset  . Anxiety disorder Mother   . Depression Mother   . Alcohol abuse Brother     Social History:  Social History   Socioeconomic History  . Marital status: Married    Spouse name: rodney  . Number of children: 3  . Years of education: Not on file  . Highest education level: Some college, no degree  Occupational History    Comment: full time  Social Needs  . Financial resource strain: Somewhat hard  . Food insecurity:    Worry: Sometimes true    Inability: Sometimes true  . Transportation needs:    Medical: No    Non-medical: No  Tobacco Use  . Smoking status: Never Smoker  . Smokeless tobacco: Never Used  Substance and Sexual Activity  . Alcohol use: Yes    Alcohol/week: 0.0 standard drinks    Comment: social  . Drug use: Yes    Types: Marijuana    Comment: 6 mths ago  . Sexual activity: Yes    Partners: Male    Birth control/protection: Surgical    Comment: Tubal ligation   Lifestyle  . Physical activity:    Days per week: 0 days    Minutes per session: 0 min  . Stress: Very much  Relationships  . Social connections:    Talks on phone: More than three times a week  Gets together: Never    Attends religious service: Never    Active member of club or organization: No    Attends meetings of clubs or organizations: Never    Relationship status: Married  . Intimate partner violence:    Fear of current or ex partner: No    Emotionally abused: No    Physically abused: No    Forced sexual activity: No  Other Topics Concern  . Not on file  Social History Narrative  . Not on file    Allergies:  Allergies  Allergen Reactions  . Neuromuscular Blocking Agents   . Hydrocodone-Acetaminophen Nausea Only    Medications: Prior to Admission medications   Medication Sig Start [name] End [name] Taking? Authorizing  Provider  doxepin (SINEQUAN) 10 MG capsule Take 1-2 capsules (10-20 mg total) by mouth at bedtime. 04/24/18  Yes Ursula Alert, MD  DULoxetine (CYMBALTA) 20 MG capsule Take 1 capsule (20 mg total) by mouth daily. Take it along with 60 mg 04/24/18  Yes Eappen, Ria Clock, MD  DULoxetine (CYMBALTA) 60 MG capsule Take 1 capsule (60 mg total) by mouth daily. To be combined with 20 mg 04/24/18  Yes Eappen, Ria Clock, MD  ibuprofen (ADVIL,MOTRIN) 800 MG tablet Take 800 mg by mouth every 8 (eight) hours as needed.   Yes [provider]    Physical Exam Vitals:  Vitals:   08/19/18 1515  BP: (!) 144/100  Pulse: 86   No LMP recorded. (Menstrual status: Irregular Periods).  General: NAD HEENT: normocephalic, anicteric Pulmonary: No increased work of breathing Genitourinary:  External: Normal external female genitalia.  Normal urethral meatus, normal  Bartholin's and Skene's glands.    Vagina: Atrophic vaginal mucosa, no evidence of prolapse, the posterior fourchette has an area of denuded tissue .    Cervix: Grossly normal in appearance, no bleeding  Uterus: Non-enlarged, mobile, normal contour.  No CMT  Adnexa: ovaries non-enlarged, no adnexal masses  Rectal: deferred  Lymphatic: no evidence of inguinal lymphadenopathy Extremities: no edema, erythema, or tenderness Neurologic: Grossly intact Psychiatric: mood appropriate, affect full  Female chaperone present for pelvic  portions of the physical exam  Assessment: 52 y.o. [id number] with chronic vaginitis  Plan: Problem List Items Addressed This Visit    None    Visit Diagnoses    Vaginal atrophy    -  Primary   Relevant Orders   NuSwab VG+, HSV (Completed)   HSV-2 seropositive       Relevant Orders   NuSwab VG+, HSV (Completed)   Routine screening for STI (sexually transmitted infection)       Relevant Orders   NuSwab VG+, HSV (Completed)     1) Vulvovaginal atrophy and vulvodynia - significant atrophic changes with denuded  tissue noted posterior fourchette, as well as  atrophy and regression of labia minora.  These changes do not look consistent with HSV. Risk factors for bacterial vaginosis and candida infections discussed.  We discussed normal vaginal flora/microbiome.  Any factors that may alter the microbiome increase the risk of these opportunistic infections.  These include changes in pH, antibiotic exposures, diabetes, wet bathing suits etc.  We discussed that treatment is aimed at eradicating abnormal bacterial overgrowth and or yeast.  There may be some role for vaginal probiotics in restoring normal vaginal flora.   - NuSwab collected - Trial of premarin cream  2) We discussed WHI study findings in detail.  In the combined estrogen-progesterone arm breast cancer risk was increased by 1.26 (CI of 1.00  to 1.59), coronary heart disease 1.29 (CI 1.02-1.63), stroke risk 1.41 (1.07-1.85), and pulmonary embolism 2.13 (CI 1.39-3.25).  That being said the while statistically significant the actual number of cases attributable are relatively small at an addition 8 cases of breast cancer, 7 more coronary artery event, 8 more strokes, and 8 additional case of pulmonary embolism per 10,000 women.  Study was terminated because of the increased breast cancer risk, this was not seen in the progestin only arm of the study for women without an intact uterus.  In addition it is important to note that HRT also had positive or risk reducing effects, and all cause mortality between the HRT/non-HRT users is not statistically different.  Estrogen-progestin HRT decreased the relative risk of hip fracture 0.66 (CI 0.45-0.98), colorectal cancer 0.63 (0.43-0.92).  Current consensus is to limit dose to the lowest effective dose, and shortest treatment duration possible.  Breast cancer risk appeared to increase after 4 years of use.  Also important to note is that these risk refer to systemic HRT for the treatment of vasomotor symptoms, and do not  apply to vaginal preperations with minimal systemic absorption and aimed at treating symptoms of vulvovaginal atrophy.    3) Return in about 6 weeks (around 09/30/2018) for medication follow up.    Malachy Mood, MD, [facility], [place]

## 2018-08-22 LAB — NUSWAB VG+, HSV
BVAB 2: HIGH Score — AB
CHLAMYDIA TRACHOMATIS, NAA: NEGATIVE
Candida albicans, NAA: NEGATIVE
Candida glabrata, NAA: NEGATIVE
HSV 1 NAA: NEGATIVE
HSV 2 NAA: NEGATIVE
Neisseria gonorrhoeae, NAA: NEGATIVE
TRICH VAG BY NAA: NEGATIVE

## 2018-09-02 ENCOUNTER — Telehealth: Payer: Self-pay

## 2018-09-02 ENCOUNTER — Other Ambulatory Visit: Payer: Self-pay | Admitting: Obstetrics and Gynecology

## 2018-09-02 MED ORDER — ESTRADIOL 0.1 MG/GM VA CREA: 1.0000 g | TOPICAL_CREAM | VAGINAL | 3 refills | Status: DC

## 2018-09-02 NOTE — Telephone Encounter (Signed)
[place] w/[facility] reports patients out of pocket cost for Premarin cream is $218 after manufacturer coupon & insurance applied. Inquiring if ok to switch to Estrace Cream. Cb#[phone]

## 2018-09-02 NOTE — Telephone Encounter (Signed)
New rx called in

## 2018-09-03 NOTE — Telephone Encounter (Signed)
Pharmacy aware & received rx. Processing now.

## 2018-09-30 ENCOUNTER — Ambulatory Visit: Payer: BLUE CROSS/BLUE SHIELD | Admitting: Obstetrics and Gynecology

## 2018-10-12 ENCOUNTER — Ambulatory Visit: Payer: BLUE CROSS/BLUE SHIELD | Admitting: Obstetrics and Gynecology

## 2018-11-05 ENCOUNTER — Other Ambulatory Visit: Payer: Self-pay | Admitting: Psychiatry

## 2018-11-30 ENCOUNTER — Other Ambulatory Visit: Payer: Self-pay

## 2018-11-30 ENCOUNTER — Ambulatory Visit: Payer: BLUE CROSS/BLUE SHIELD | Admitting: Psychiatry

## 2018-11-30 ENCOUNTER — Encounter: Payer: Self-pay | Admitting: Psychiatry

## 2018-11-30 VITALS — BP 142/91 | HR 80 | Temp 97.7°F | Wt 195.8 lb

## 2018-11-30 DIAGNOSIS — F331 Major depressive disorder, recurrent, moderate: Secondary | ICD-10-CM | POA: Diagnosis not present

## 2018-11-30 DIAGNOSIS — F5105 Insomnia due to other mental disorder: Secondary | ICD-10-CM

## 2018-11-30 MED ORDER — DULOXETINE HCL 60 MG PO CPEP: 60.0000 mg | ORAL_CAPSULE | Freq: Every day | ORAL | 0 refills | Status: DC

## 2018-11-30 MED ORDER — DULOXETINE HCL 20 MG PO CPEP: 20.0000 mg | ORAL_CAPSULE | Freq: Every day | ORAL | 0 refills | Status: DC

## 2018-11-30 MED ORDER — DOXEPIN HCL 10 MG PO CAPS: 10.0000 mg | ORAL_CAPSULE | Freq: Every day | ORAL | 0 refills | Status: DC

## 2018-11-30 NOTE — Progress Notes (Signed)
[facility] MD OP Progress Note  11/30/2018 4:24 PM Jamie [name]  MRN:  [id number]  Chief Complaint: ' I am here for follow up.' Chief Complaint    Follow-up     HPI: Jamie [name] is a 52 year old Caucasian female, married, lives in [place], employed, has a history of depression, chronic pain, presented to the clinic today for a follow-up visit.  Patient reports she was unable to keep up with follow-up appointments the past few months since she had some financial stressors and health insurance issues.  She reports that has been taken care of now.  She reports she is compliant on her Cymbalta.  She reports she feels her mood symptoms is better.  She has been taking doxepin for sleep which helps.  She denies any side effects to medications.  She reports she is trying to make certain choices in her life so that she can do the right thing for her family as well as make sure she stays away from all the negative environment that triggers her anxiety.  She reports she has changed her work schedule to third shift which has been helpful.  She continues to be having some relationship struggles with her husband and she is worried that he may be cheating on her.  She however reports she is better able to cope with those thoughts now and is not too distressed by it.  She has been talking to her pastor which has been helpful.  Patient denies any suicidality.  Patient denies any perceptual disturbances.   Visit Diagnosis:    ICD-10-CM   1. MDD (major depressive disorder), recurrent episode, moderate (HCC) F33.1 DULoxetine (CYMBALTA) 20 MG capsule    doxepin (SINEQUAN) 10 MG capsule   improving  2. Insomnia due to mental condition F51.05 DULoxetine (CYMBALTA) 20 MG capsule   improved    Past Psychiatric History: I have reviewed past psychiatric history from my progress note on 03/27/2018  Past Medical History:  Past Medical History:  Diagnosis Date  . Anxiety   . Brachial neuritis   . Chronic neck pain   .  Chronic pain not due to malignancy   . Depressive disorder    followed by Dr. Alethia [name].  . Disturbance, sleep   . Elevated blood pressure reading without diagnosis of hypertension   . Embolism and thrombosis of splenic artery   . H/O thrombophlebitis   . Infarction of spleen   . Lumbosacral neuritis   . Obesity, Class II, BMI 35-39.9, with comorbidity     Past Surgical History:  Procedure Laterality Date  . CESAREAN SECTION  09/24/2010  . TONSILLECTOMY AND ADENOIDECTOMY  09/24/2010  . TUBAL LIGATION  09/24/2010    Family Psychiatric History: I have reviewed family psychiatric history from my progress note on 03/27/2018 Family History:  Family History  Problem Relation Age of Onset  . Anxiety disorder Jamie [name]   . Depression Jamie [name]   . Alcohol abuse Jamie [name]     Social History: Have reviewed social history from my progress note on 03/27/2018 Social History   Socioeconomic History  . Marital status: Married    Spouse name: Jamie [name]  . Number of children: 3  . Years of education: Not on file  . Highest education level: Some college, no degree  Occupational History    Comment: full time  Social Needs  . Financial resource strain: Somewhat hard  . Food insecurity:    Worry: Sometimes true    Inability: Sometimes true  . Transportation needs:  Medical: No    Non-medical: No  Tobacco Use  . Smoking status: Never Smoker  . Smokeless tobacco: Never Used  Substance and Sexual Activity  . Alcohol use: Yes    Alcohol/week: 0.0 standard drinks    Comment: social  . Drug use: Yes    Types: Marijuana    Comment: 6 mths ago  . Sexual activity: Yes    Partners: Male    Birth control/protection: Surgical    Comment: Tubal ligation   Lifestyle  . Physical activity:    Days per week: 0 days    Minutes per session: 0 min  . Stress: Very much  Relationships  . Social connections:    Talks on phone: More than three times a week    Gets together: Never    Attends religious  service: Never    Active member of club or organization: No    Attends meetings of clubs or organizations: Never    Relationship status: Married  Other Topics Concern  . Not on file  Social History Narrative  . Not on file    Allergies:  Allergies  Allergen Reactions  . Neuromuscular Blocking Agents   . Hydrocodone-Acetaminophen Nausea Only    Metabolic Disorder Labs: No results found for: HGBA1C, MPG No results found for: PROLACTIN No results found for: CHOL, TRIG, HDL, CHOLHDL, VLDL, LDLCALC No results found for: TSH  Therapeutic Level Labs: No results found for: LITHIUM No results found for: VALPROATE No components found for:  CBMZ  Current Medications: Current Outpatient Medications  Medication Sig Dispense Refill  . doxepin (SINEQUAN) 10 MG capsule Take 1-2 capsules (10-20 mg total) by mouth at bedtime. 180 capsule 0  . DULoxetine (CYMBALTA) 20 MG capsule Take 1 capsule (20 mg total) by mouth daily. Take it along with 60 mg 90 capsule 0  . DULoxetine (CYMBALTA) 60 MG capsule Take 1 capsule (60 mg total) by mouth daily. To be combined with 20 mg 90 capsule 0  . estradiol (ESTRACE VAGINAL) 0.1 MG/GM vaginal cream Place 1 g vaginally 3 (three) times a week. 42.5 g 3  . ibuprofen (ADVIL,MOTRIN) 800 MG tablet Take 800 mg by mouth every 8 (eight) hours as needed.     No current facility-administered medications for this visit.      Musculoskeletal: Strength & Muscle Tone: within normal limits Gait & Station: normal Patient leans: N/A  Psychiatric Specialty Exam: Review of Systems  Psychiatric/Behavioral: Positive for depression (improved).  All other systems reviewed and are negative.   Blood pressure (!) 142/91, pulse 80, temperature 97.7 F (36.5 C), temperature source Oral, weight 195 lb 12.8 oz (88.8 kg).Body mass index is 35.81 kg/m.  General Appearance: Casual  Eye Contact:  Fair  Speech:  Normal Rate  Volume:  Normal  Mood:  Dysphoric improving   Affect:  Congruent  Thought Process:  Goal Directed and Descriptions of Associations: Intact  Orientation:  Full (Time, Place, and Person)  Thought Content: Logical   Suicidal Thoughts:  No  Homicidal Thoughts:  No  Memory:  Immediate;   Fair Recent;   Fair Remote;   Fair  Judgement:  Fair  Insight:  Fair  Psychomotor Activity:  Normal  Concentration:  Concentration: Fair and Attention Span: Fair  Recall:  [employer] of Knowledge: Fair  Language: Fair  Akathisia:  No  Handed:  Right  AIMS (if indicated): na  Assets:  Communication Skills Desire for Improvement Social Support  ADL's:  Intact  Cognition: WNL  Sleep:  Fair   Screenings: PHQ2-9     Office Visit from 10/11/2016 in [facility] Office Visit from 06/26/2016 in [facility] Office Visit from 03/13/2016 in [facility] Office Visit from 11/14/2015 in [facility] Office Visit from 08/18/2015 in [facility]  PHQ-2 Total Score  2  1  1  2  1   PHQ-9 Total Score  8  -  -  8  -       Assessment and Plan: Mychele is a 52 year old Caucasian female who has a history of depression, chronic pain, relationship struggles, presented to the clinic today for a follow-up visit.  Patient reports her mood symptoms as improving and she is compliant with her medications.  She continues to struggle with relationship stressors and hence advised to continue psychotherapy sessions with Ms. Peacock.  Will continue plan as noted below.  Plan Depression Cymbalta 80 mg p.o. daily Doxepin 10-20 mg p.o. nightly  Insomnia Doxepin 20 mg p.o. nightly   Patient will continue psychotherapy sessions with Ms. Peacock.  Follow-up in clinic in 2  month or sooner if needed.  More than 50 % of the time was spent for psychoeducation and supportive psychotherapy and care coordination.  This note was generated in part or whole with voice recognition software.  Voice recognition is usually quite accurate but there are transcription errors that can and very often do occur. I apologize for any typographical errors that were not detected and corrected.        Ursula Alert, MD 11/30/2018, 4:24 PM

## 2019-03-12 ENCOUNTER — Other Ambulatory Visit: Payer: Self-pay | Admitting: Psychiatry

## 2019-03-12 DIAGNOSIS — F331 Major depressive disorder, recurrent, moderate: Secondary | ICD-10-CM

## 2019-03-12 DIAGNOSIS — F5105 Insomnia due to other mental disorder: Secondary | ICD-10-CM

## 2019-03-15 ENCOUNTER — Telehealth: Payer: Self-pay

## 2019-03-15 DIAGNOSIS — F331 Major depressive disorder, recurrent, moderate: Secondary | ICD-10-CM

## 2019-03-15 NOTE — Telephone Encounter (Signed)
pt was given an appt for april 1st but pt will need enought medications to get to next appt.

## 2019-03-15 NOTE — Telephone Encounter (Signed)
pt left a message that she needs a refill on her medications

## 2019-03-16 MED ORDER — DOXEPIN HCL 10 MG PO CAPS: 10.0000 mg | ORAL_CAPSULE | Freq: Every day | ORAL | 0 refills | Status: DC

## 2019-03-16 NOTE — Telephone Encounter (Signed)
I HAVE SENT MEDS TO PHARMACY

## 2019-03-17 MED ORDER — DOXEPIN HCL 10 MG PO CAPS: 10.0000 mg | ORAL_CAPSULE | Freq: Every day | ORAL | 0 refills | Status: DC

## 2019-03-17 NOTE — Telephone Encounter (Signed)
Sent doxepin again to pharmacy - since received transmission error.

## 2019-03-31 ENCOUNTER — Encounter (INDEPENDENT_AMBULATORY_CARE_PROVIDER_SITE_OTHER): Payer: PRIVATE HEALTH INSURANCE | Admitting: Psychiatry

## 2019-03-31 ENCOUNTER — Other Ambulatory Visit: Payer: Self-pay

## 2019-03-31 NOTE — Progress Notes (Deleted)
Virtual Visit via Telephone Note  I connected with Jamie [name] on 03/31/19 at 10:30 AM EDT by telephone and verified that I am speaking with the correct person using two identifiers.   I discussed the limitations, risks, security and privacy concerns of performing an evaluation and management service by telephone and the availability of in person appointments. I also discussed with the patient that there may be a patient responsible charge related to this service. The patient expressed understanding and agreed to proceed.  I discussed the assessment and treatment plan with the patient. The patient was provided an opportunity to ask questions and all were answered. The patient agreed with the plan and demonstrated an understanding of the instructions.   The patient was advised to call back or seek an in-person evaluation if the symptoms worsen or if the condition fails to improve as anticipated.  I provided *** minutes of non-face-to-face time during this encounter.   Ursula Alert, MD  [facility] MD/PA/NP OP Progress Note  03/31/2019 10:31 AM Jamie [name]  MRN:  [id number]  Chief Complaint:  HPI: *** Visit Diagnosis: No diagnosis found.  Past Psychiatric History: ***  Past Medical History:  Past Medical History:  Diagnosis Date  . Anxiety   . Brachial neuritis   . Chronic neck pain   . Chronic pain not due to malignancy   . Depressive disorder    followed by Dr. Alethia Berthold.  . Disturbance, sleep   . Elevated blood pressure reading without diagnosis of hypertension   . Embolism and thrombosis of splenic artery   . H/O thrombophlebitis   . Infarction of spleen   . Lumbosacral neuritis   . Obesity, Class II, BMI 35-39.9, with comorbidity     Past Surgical History:  Procedure Laterality Date  . CESAREAN SECTION  09/24/2010  . TONSILLECTOMY AND ADENOIDECTOMY  09/24/2010  . TUBAL LIGATION  09/24/2010    Family Psychiatric History: ***  Family History:  Family History   Problem Relation Age of Onset  . Anxiety disorder Mother   . Depression Mother   . Alcohol abuse Brother     Social History:  Social History   Socioeconomic History  . Marital status: Married    Spouse name: rodney  . Number of children: 3  . Years of education: Not on file  . Highest education level: Some college, no degree  Occupational History    Comment: full time  Social Needs  . Financial resource strain: Somewhat hard  . Food insecurity:    Worry: Sometimes true    Inability: Sometimes true  . Transportation needs:    Medical: No    Non-medical: No  Tobacco Use  . Smoking status: Never Smoker  . Smokeless tobacco: Never Used  Substance and Sexual Activity  . Alcohol use: Yes    Alcohol/week: 0.0 standard drinks    Comment: social  . Drug use: Yes    Types: Marijuana    Comment: 6 mths ago  . Sexual activity: Yes    Partners: Male    Birth control/protection: Surgical    Comment: Tubal ligation   Lifestyle  . Physical activity:    Days per week: 0 days    Minutes per session: 0 min  . Stress: Very much  Relationships  . Social connections:    Talks on phone: More than three times a week    Gets together: Never    Attends religious service: Never    Active member of club  or organization: No    Attends meetings of clubs or organizations: Never    Relationship status: Married  Other Topics Concern  . Not on file  Social History Narrative  . Not on file    Allergies:  Allergies  Allergen Reactions  . Neuromuscular Blocking Agents   . Hydrocodone-Acetaminophen Nausea Only    Metabolic Disorder Labs: No results found for: HGBA1C, MPG No results found for: PROLACTIN No results found for: CHOL, TRIG, HDL, CHOLHDL, VLDL, LDLCALC No results found for: TSH  Therapeutic Level Labs: No results found for: LITHIUM No results found for: VALPROATE No components found for:  CBMZ  Current Medications: Current Outpatient Medications  Medication Sig  Dispense Refill  . doxepin (SINEQUAN) 10 MG capsule Take 1-2 capsules (10-20 mg total) by mouth at bedtime. 180 capsule 0  . DULoxetine (CYMBALTA) 20 MG capsule TAKE 1 CAPSULE BY MOUTH ONCE DAILY. TAKE ALONG WITH 60MG  90 capsule 0  . DULoxetine (CYMBALTA) 60 MG capsule TAKE 1 CAPSULE BY MOUTH ONCE DAILY, TO BE COMBINED WITH 20MG  90 capsule 0  . estradiol (ESTRACE VAGINAL) 0.1 MG/GM vaginal cream Place 1 g vaginally 3 (three) times a week. 42.5 g 3  . ibuprofen (ADVIL,MOTRIN) 800 MG tablet Take 800 mg by mouth every 8 (eight) hours as needed.     No current facility-administered medications for this visit.      Musculoskeletal: Strength & Muscle Tone: {desc; muscle tone:32375} Gait & Station: {PE GAIT ED RWER:15400} Patient leans: {Patient Leans:21022755}  Psychiatric Specialty Exam: ROS  There were no vitals taken for this visit.There is no height or weight on file to calculate BMI.  General Appearance: {Appearance:22683}  Eye Contact:  {BHH EYE CONTACT:22684}  Speech:  {Speech:22685}  Volume:  {Volume (PAA):22686}  Mood:  {BHH MOOD:22306}  Affect:  {Affect (PAA):22687}  Thought Process:  {Thought Process (PAA):22688}  Orientation:  {BHH ORIENTATION (PAA):22689}  Thought Content: {Thought Content:22690}   Suicidal Thoughts:  {ST/HT (PAA):22692}  Homicidal Thoughts:  {ST/HT (PAA):22692}  Memory:  {BHH MEMORY:22881}  Judgement:  {Judgement (PAA):22694}  Insight:  {Insight (PAA):22695}  Psychomotor Activity:  {Psychomotor (PAA):22696}  Concentration:  {Concentration:21399}  Recall:  {BHH GOOD/FAIR/POOR:22877}  Fund of Knowledge: {BHH GOOD/FAIR/POOR:22877}  Language: {BHH GOOD/FAIR/POOR:22877}  Akathisia:  {BHH YES OR NO:22294}  Handed:  {Handed:22697}  AIMS (if indicated): {Desc; done/not:10129}  Assets:  {Assets (PAA):22698}  ADL's:  {BHH QQP'Y:19509}  Cognition: {chl bhh cognition:304700322}  Sleep:  {BHH GOOD/FAIR/POOR:22877}   Screenings: PHQ2-9     Office Visit from  10/11/2016 in [facility] Office Visit from 06/26/2016 in [facility] Office Visit from 03/13/2016 in [facility] Office Visit from 11/14/2015 in [facility] Office Visit from 08/18/2015 in [facility]  PHQ-2 Total Score  2  1  1  2  1   PHQ-9 Total Score  8  -  -  8  -       Assessment and Plan: ***   Ursula Alert, MD 03/31/2019, 10:31 AM

## 2019-04-01 ENCOUNTER — Other Ambulatory Visit: Payer: Self-pay

## 2019-04-01 ENCOUNTER — Ambulatory Visit (INDEPENDENT_AMBULATORY_CARE_PROVIDER_SITE_OTHER): Payer: PRIVATE HEALTH INSURANCE | Admitting: Psychiatry

## 2019-04-01 ENCOUNTER — Encounter: Payer: Self-pay | Admitting: Psychiatry

## 2019-04-01 DIAGNOSIS — F331 Major depressive disorder, recurrent, moderate: Secondary | ICD-10-CM | POA: Diagnosis not present

## 2019-04-01 DIAGNOSIS — F5105 Insomnia due to other mental disorder: Secondary | ICD-10-CM | POA: Diagnosis not present

## 2019-04-01 MED ORDER — DULOXETINE HCL 30 MG PO CPEP: 30.0000 mg | ORAL_CAPSULE | Freq: Every day | ORAL | 0 refills | Status: DC

## 2019-04-01 MED ORDER — ARIPIPRAZOLE 2 MG PO TABS: 2.0000 mg | ORAL_TABLET | Freq: Every day | ORAL | 0 refills | Status: DC

## 2019-04-01 MED ORDER — DULOXETINE HCL 60 MG PO CPEP: 60.0000 mg | ORAL_CAPSULE | Freq: Every day | ORAL | 0 refills | Status: DC

## 2019-04-01 NOTE — Progress Notes (Signed)
Virtual Visit via Telephone Note  I connected with [name] on 04/01/19 at 11:15 AM EDT by telephone and verified that I am speaking with the correct person using two identifiers.   I discussed the limitations, risks, security and privacy concerns of performing an evaluation and management service by telephone and the availability of in person appointments. I also discussed with the patient that there may be a patient responsible charge related to this service. The patient expressed understanding and agreed to proceed.   I discussed the assessment and treatment plan with the patient. The patient was provided an opportunity to ask questions and all were answered. The patient agreed with the plan and demonstrated an understanding of the instructions.   The patient was advised to call back or seek an in-person evaluation if the symptoms worsen or if the condition fails to improve as anticipated.  I provided 15 minutes of non-face-to-face time during this encounter.   Ursula Alert, MD  [facility] MD  OP Progress Note  04/01/2019 12:24 PM Jamie [name]  MRN:  [id number]  Chief Complaint: ' I am here for follow up." Chief Complaint    Follow-up; Medication Refill; Anxiety; Depression; Stress; Fatigue     HPI: [name] is a 53 year old Caucasian female, married, lives in [place], employed, has a history of depression, insomnia, chronic pain, was evaluated by phone today.  Patient today reports she has been struggling with depression and anxiety symptoms since the past few days.  She reports there is a lot of psychosocial stressors at this time.  She reports her children are currently being homeschooled.  She has a child with ADHD who needs structure.  He is not doing well without it.  She reports her shift at work also has changed.  She was doing well emotionally with her previous shift of 5:30 AM to 2 PM.  She however reports she got a 2-day notice stating that her shift has changed and now she  works nights.  She reports it is difficult for her since she comes back to her home where her children are being homeschooled and they need her help and hence she is unable to rest or sleep.  This is affecting her mood,, concentration and making her more anxious.  Patient reports her husband stays home some days and is spending some time with the children which is helpful.  Patient reports she does not have any suicidal thoughts at this time.  She denies any homicidality or perceptual disturbances.  Patient reports she is interested in dosage change of her medications.  Discussed adding Abilify.  She agrees with plan.  Discussed with patient to restart psychotherapy sessions with Ms. Peacock.  She agrees with plan. Visit Diagnosis:    ICD-10-CM   1. MDD (major depressive disorder), recurrent episode, moderate (HCC) F33.1 ARIPiprazole (ABILIFY) 2 MG tablet    DULoxetine (CYMBALTA) 60 MG capsule    DULoxetine (CYMBALTA) 30 MG capsule  2. Insomnia due to mental condition F51.05     Past Psychiatric History: Reviewed past psychiatric history from my progress note on 03/27/2018.  Past Medical History:  Past Medical History:  Diagnosis Date  . Anxiety   . Brachial neuritis   . Chronic neck pain   . Chronic pain not due to malignancy   . Depressive disorder    followed by Dr. Alethia Berthold.  . Disturbance, sleep   . Elevated blood pressure reading without diagnosis of hypertension   . Embolism and thrombosis of splenic  artery   . H/O thrombophlebitis   . Infarction of spleen   . Lumbosacral neuritis   . Obesity, Class II, BMI 35-39.9, with comorbidity     Past Surgical History:  Procedure Laterality Date  . CESAREAN SECTION  09/24/2010  . TONSILLECTOMY AND ADENOIDECTOMY  09/24/2010  . TUBAL LIGATION  09/24/2010    Family Psychiatric History: Reviewed family psychiatric history from my progress note on 03/27/2018.  Family History:  Family History  Problem Relation Age of Onset  .  Anxiety disorder Mother   . Depression Mother   . Alcohol abuse Brother     Social History: Reviewed social history from my progress note on 03/27/2018. Social History   Socioeconomic History  . Marital status: Married    Spouse name: rodney  . Number of children: 3  . Years of education: Not on file  . Highest education level: Some college, no degree  Occupational History    Comment: full time  Social Needs  . Financial resource strain: Somewhat hard  . Food insecurity:    Worry: Sometimes true    Inability: Sometimes true  . Transportation needs:    Medical: No    Non-medical: No  Tobacco Use  . Smoking status: Never Smoker  . Smokeless tobacco: Never Used  Substance and Sexual Activity  . Alcohol use: Yes    Alcohol/week: 0.0 standard drinks    Comment: social  . Drug use: Yes    Types: Marijuana    Comment: 6 mths ago  . Sexual activity: Yes    Partners: Male    Birth control/protection: Surgical    Comment: Tubal ligation   Lifestyle  . Physical activity:    Days per week: 0 days    Minutes per session: 0 min  . Stress: Very much  Relationships  . Social connections:    Talks on phone: More than three times a week    Gets together: Never    Attends religious service: Never    Active member of club or organization: No    Attends meetings of clubs or organizations: Never    Relationship status: Married  Other Topics Concern  . Not on file  Social History Narrative  . Not on file    Allergies:  Allergies  Allergen Reactions  . Neuromuscular Blocking Agents   . Hydrocodone-Acetaminophen Nausea Only    Metabolic Disorder Labs: No results found for: HGBA1C, MPG No results found for: PROLACTIN No results found for: CHOL, TRIG, HDL, CHOLHDL, VLDL, LDLCALC No results found for: TSH  Therapeutic Level Labs: No results found for: LITHIUM No results found for: VALPROATE No components found for:  CBMZ  Current Medications: Current Outpatient  Medications  Medication Sig Dispense Refill  . doxepin (SINEQUAN) 10 MG capsule Take 1-2 capsules (10-20 mg total) by mouth at bedtime. 180 capsule 0  . ARIPiprazole (ABILIFY) 2 MG tablet Take 1 tablet (2 mg total) by mouth daily. 90 tablet 0  . DULoxetine (CYMBALTA) 30 MG capsule Take 1 capsule (30 mg total) by mouth daily. Take with 60 mg 90 capsule 0  . DULoxetine (CYMBALTA) 60 MG capsule Take 1 capsule (60 mg total) by mouth daily. To be combined with 30 mg 90 capsule 0  . estradiol (ESTRACE VAGINAL) 0.1 MG/GM vaginal cream Place 1 g vaginally 3 (three) times a week. (Patient not taking: Reported on 04/01/2019) 42.5 g 3  . ibuprofen (ADVIL,MOTRIN) 800 MG tablet Take 800 mg by mouth every 8 (eight) hours as  needed.     No current facility-administered medications for this visit.      Musculoskeletal: Strength & Muscle Tone: UTA Gait & Station: UTA Patient leans: N/A  Psychiatric Specialty Exam: Review of Systems  Psychiatric/Behavioral: The patient is nervous/anxious.   All other systems reviewed and are negative.   There were no vitals taken for this visit.There is no height or weight on file to calculate BMI.  General Appearance: UTA  Eye Contact:  UTA  Speech:  Clear and Coherent  Volume:  Normal  Mood:  Anxious  Affect:  UTA  Thought Process:  Goal Directed and Descriptions of Associations: Intact  Orientation:  Full (Time, Place, and Person)  Thought Content: Logical   Suicidal Thoughts:  No  Homicidal Thoughts:  No  Memory:  Immediate;   Fair Recent;   Fair Remote;   Fair  Judgement:  Fair  Insight:  Fair  Psychomotor Activity:  UTA  Concentration:  Concentration: Fair and Attention Span: Fair  Recall:  [employer] of Knowledge: Fair  Language: Fair  Akathisia:  No  Handed:  Right  AIMS (if indicated): denies tremors, rigidity,stiffness  Assets:  Communication Skills Desire for Improvement Social Support  ADL's:  Intact  Cognition: WNL  Sleep:  Restless    Screenings: PHQ2-9     Office Visit from 10/11/2016 in [facility] Office Visit from 06/26/2016 in [facility] Office Visit from 03/13/2016 in [facility] Office Visit from 11/14/2015 in [facility] Office Visit from 08/18/2015 in [facility]  PHQ-2 Total Score  2  1  1  2  1   PHQ-9 Total Score  8  -  -  8  -       Assessment and Plan: Jameeka is a 53 year old Caucasian female who has a history of depression, chronic pain, relationship struggles was evaluated by phone today.  Patient continues to have psychosocial stressors of recent COVID-19, kids being homeschooled, having a child with ADHD, recent shift change at work and so on.  Patient will benefit from medication changes as well as psychotherapy sessions.  Plan as noted below.  Plan Depression- unstable Increase Cymbalta to 90 mg p.o. daily Start Abilify 2 mg p.o. daily   Insomnia Continue doxepin 10 to 20 mg p.o. nightly.  For chronic pain syndrome- she will continue to work with her primary provider.  Discussed with patient to restart psychotherapy sessions with Ms. Peacock.  Patient requested a letter to give to her work to help with her shift change.  We will type up a letter today.  Follow-up in clinic in 3 to 4 weeks or sooner if needed.  I have spent atleast 15 minutes non face to face with patient today. More than 50 % of the time was spent for psychoeducation and supportive psychotherapy and care coordination.  This note was generated in part or whole with voice recognition software. Voice recognition is usually quite accurate but there are transcription errors that can and very often do occur. I apologize for any typographical errors that were not detected and corrected.       Ursula Alert, MD 04/01/2019, 12:24 PM

## 2019-04-01 NOTE — Progress Notes (Signed)
TC on  04-01-19 medical and surgical hx was reviewed with patient with no changes. Medication and pharmacy was reviewed and medication up dated.  Pt vitals were not take due to this is a phone consult

## 2019-04-01 NOTE — Patient Instructions (Signed)
Aripiprazole tablets What is this medicine? ARIPIPRAZOLE (ay ri PIP ray zole) is an atypical antipsychotic. It is used to treat schizophrenia and bipolar disorder, also known as manic-depression. It is also used to treat Tourette's disorder and some symptoms of autism. This medicine may also be used in combination with antidepressants to treat major depressive disorder. This medicine may be used for other purposes; ask your health care provider or pharmacist if you have questions. COMMON BRAND NAME(S): Abilify What should I tell my health care provider before I take this medicine? They need to know if you have any of these conditions: -dehydration -dementia -diabetes -heart disease -history of stroke -low blood counts, like low white cell, platelet, or red cell counts -Parkinson's disease -seizures -suicidal thoughts, plans, or attempt; a previous suicide attempt by you or a family member -an unusual or allergic reaction to aripiprazole, other medicines, foods, dyes, or preservatives -pregnant or trying to get pregnant -breast-feeding How should I use this medicine? Take this medicine by mouth with a glass of water. Follow the directions on the prescription label. You can take this medicine with or without food. Take your doses at regular intervals. Do not take your medicine more often than directed. Do not stop taking except on the advice of your doctor or health care professional. A special MedGuide will be given to you by the pharmacist with each prescription and refill. Be sure to read this information carefully each time. Talk to your pediatrician regarding the use of this medicine in children. While this drug may be prescribed for children as young as 6 years of age for selected conditions, precautions do apply. Overdosage: If you think you have taken too much of this medicine contact a poison control center or emergency room at once. NOTE: This medicine is only for you. Do not share  this medicine with others. What if I miss a dose? If you miss a dose, take it as soon as you can. If it is almost time for your next dose, take only that dose. Do not take double or extra doses. What may interact with this medicine? Do not take this medicine with any of the following medications: -brexpiprazole -cisapride -dofetilide -dronedarone -metoclopramide -pimozide -thioridazine This medicine may also interact with the following medications: -alcohol -carbamazepine -certain medicines for anxiety or sleep -certain medicines for blood pressure -certain medicines for fungal infections like ketoconazole, fluconazole, posaconazole, and itraconazole -clarithromycin -fluoxetine -other medicines that prolong the QT interval (cause an abnormal heart rhythm) -paroxetine -quinidine -rifampin This list may not describe all possible interactions. Give your health care provider a list of all the medicines, herbs, non-prescription drugs, or dietary supplements you use. Also tell them if you smoke, drink alcohol, or use illegal drugs. Some items may interact with your medicine. What should I watch for while using this medicine? Visit your doctor or health care professional for regular checks on your progress. It may be several weeks before you see the full effects of this medicine. Do not suddenly stop taking this medicine. You may need to gradually reduce the dose. Patients and their families should watch out for worsening depression or thoughts of suicide. Also watch out for sudden changes in feelings such as feeling anxious, agitated, panicky, irritable, hostile, aggressive, impulsive, severely restless, overly excited and hyperactive, or not being able to sleep. If this happens, especially at the beginning of antidepressant treatment or after a change in dose, call your health care professional. You may get dizzy or drowsy. Do   not drive, use machinery, or do anything that needs mental  alertness until you know how this medicine affects you. Do not stand or sit up quickly, especially if you are an older patient. This reduces the risk of dizzy or fainting spells. Alcohol can increase dizziness and drowsiness. Avoid alcoholic drinks. This medicine can reduce the response of your body to heat or cold. Dress warmly in cold weather and stay hydrated in hot weather. If possible, avoid extreme temperatures like saunas, hot tubs, very hot or cold showers, or activities that can cause dehydration such as vigorous exercise. This medicine may cause dry eyes and blurred vision. If you wear contact lenses, you may feel some discomfort. Lubricating drops may help. See your eye doctor if the problem does not go away or is severe. If you notice an increased hunger or thirst, different from your normal hunger or thirst, or if you find that you have to urinate more frequently, you should contact your health care provider as soon as possible. You may need to have your blood sugar monitored. This medicine may cause changes in your blood sugar levels. You should monitor your blood sugar frequently if you have diabetes. There have been reports of uncontrollable and strong urges to gamble, binge eat, shop, and have sex while taking this medicine. If you experience any of these or other uncontrollable and strong urges while taking this medicine, you should report it to your health care provider as soon as possible. What side effects may I notice from receiving this medicine? Side effects that you should report to your doctor or health care professional as soon as possible: -allergic reactions like skin rash, itching or hives, swelling of the face, lips, or tongue -breathing problems -confusion -feeling faint or lightheaded, falls -fever or chills, sore throat -increased hunger or thirst -increased urination -joint pain -muscles pain, spasms -problems with balance, talking, walking -restlessness or need to  keep moving -seizures -suicidal thoughts or other mood changes -trouble swallowing -uncontrollable and excessive urges (examples: gambling, binge eating, shopping, having sex) -uncontrollable head, mouth, neck, arm, or leg movements -unusually weak or tired Side effects that usually do not require medical attention (report to your doctor or health care professional if they continue or are bothersome): -blurred vision -constipation -headache -nausea, vomiting -trouble sleeping -weight gain This list may not describe all possible side effects. Call your doctor for medical advice about side effects. You may report side effects to FDA at 1-800-FDA-1088. Where should I keep my medicine? Keep out of the reach of children. Store at room temperature between 15 and 30 degrees C (59 and 86 degrees F). Throw away any unused medicine after the expiration date. NOTE: This sheet is a summary. It may not cover all possible information. If you have questions about this medicine, talk to your doctor, pharmacist, or health care provider.  2019 [employer] Standard (2018-06-11 09:13:39)

## 2019-04-02 ENCOUNTER — Telehealth: Payer: Self-pay

## 2019-04-02 NOTE — Telephone Encounter (Signed)
Left message that letter was ready for pick

## 2019-04-27 ENCOUNTER — Other Ambulatory Visit: Payer: Self-pay

## 2019-04-27 ENCOUNTER — Ambulatory Visit (INDEPENDENT_AMBULATORY_CARE_PROVIDER_SITE_OTHER): Payer: PRIVATE HEALTH INSURANCE | Admitting: Licensed Clinical Social Worker

## 2019-04-27 DIAGNOSIS — F331 Major depressive disorder, recurrent, moderate: Secondary | ICD-10-CM

## 2019-04-29 ENCOUNTER — Ambulatory Visit (INDEPENDENT_AMBULATORY_CARE_PROVIDER_SITE_OTHER): Payer: PRIVATE HEALTH INSURANCE | Admitting: Psychiatry

## 2019-04-29 ENCOUNTER — Other Ambulatory Visit: Payer: Self-pay

## 2019-04-29 DIAGNOSIS — Z5329 Procedure and treatment not carried out because of patient's decision for other reasons: Secondary | ICD-10-CM

## 2019-05-10 ENCOUNTER — Other Ambulatory Visit: Payer: Self-pay

## 2019-05-10 ENCOUNTER — Ambulatory Visit: Payer: BLUE CROSS/BLUE SHIELD | Admitting: Licensed Clinical Social Worker

## 2019-05-11 ENCOUNTER — Ambulatory Visit (INDEPENDENT_AMBULATORY_CARE_PROVIDER_SITE_OTHER): Payer: PRIVATE HEALTH INSURANCE | Admitting: Psychiatry

## 2019-05-11 ENCOUNTER — Encounter: Payer: Self-pay | Admitting: Psychiatry

## 2019-05-11 DIAGNOSIS — F5105 Insomnia due to other mental disorder: Secondary | ICD-10-CM

## 2019-05-11 DIAGNOSIS — F331 Major depressive disorder, recurrent, moderate: Secondary | ICD-10-CM | POA: Diagnosis not present

## 2019-05-11 MED ORDER — ARIPIPRAZOLE 5 MG PO TABS: 5.0000 mg | ORAL_TABLET | Freq: Every day | ORAL | 0 refills | Status: DC

## 2019-05-11 NOTE — Progress Notes (Signed)
Virtual Visit via Video Note  I connected with Jamie [name] on 05/11/19 at  8:30 AM EDT by a video enabled telemedicine application and verified that I am speaking with the correct person using two identifiers.   I discussed the limitations of evaluation and management by telemedicine and the availability of in person appointments. The patient expressed understanding and agreed to proceed.    I discussed the assessment and treatment plan with the patient. The patient was provided an opportunity to ask questions and all were answered. The patient agreed with the plan and demonstrated an understanding of the instructions.   The patient was advised to call back or seek an in-person evaluation if the symptoms worsen or if the condition fails to improve as anticipated.   [facility] MD/PA/NP OP Progress Note  05/11/2019 9:14 AM Jamie [name]  MRN:  [id number]  Chief Complaint:  Chief Complaint    Follow-up     HPI: Jamie [name] is a 53 year old Caucasian female, married, lives in [place], employed, has a history of depression, insomnia, chronic pain was evaluated by telemedicine today.  Patient today reports she continues to feel low energy, fatigue and lack of motivation.  She reports her Cymbalta and the Abilify combination may be helping to some extent.  She denies any side effects to the medications.  She reports she continues to work night shifts.  She reports she got used to the night shift and feels better with that.  She reports she is able to come back home and sleep for a few hours and help her children with their homework.  Patient reports she met with Ms. Peacock for therapy sessions and it went well.  She does have upcoming appointment coming up.  Patient denies any suicidality, homicidality or perceptual disturbances.  Patient denies any other concerns today. Visit Diagnosis:    ICD-10-CM   1. MDD (major depressive disorder), recurrent episode, moderate (HCC) F33.1 ARIPiprazole  (ABILIFY) 5 MG tablet  2. Insomnia due to mental condition F51.05     Past Psychiatric History: I have reviewed past psychiatric history from my progress note on 03/27/2018.  Past Medical History:  Past Medical History:  Diagnosis Date  . Anxiety   . Brachial neuritis   . Chronic neck pain   . Chronic pain not due to malignancy   . Depressive disorder    followed by Dr. Alethia Berthold.  . Disturbance, sleep   . Elevated blood pressure reading without diagnosis of hypertension   . Embolism and thrombosis of splenic artery   . H/O thrombophlebitis   . Infarction of spleen   . Lumbosacral neuritis   . Obesity, Class II, BMI 35-39.9, with comorbidity     Past Surgical History:  Procedure Laterality Date  . CESAREAN SECTION  09/24/2010  . TONSILLECTOMY AND ADENOIDECTOMY  09/24/2010  . TUBAL LIGATION  09/24/2010    Family Psychiatric History: Reviewed family psychiatric history from my progress note on 03/27/2018.  Family History:  Family History  Problem Relation Age of Onset  . Anxiety disorder Mother   . Depression Mother   . Alcohol abuse Brother     Social History: Reviewed social history from my progress note on 03/27/2018. Social History   Socioeconomic History  . Marital status: Married    Spouse name: rodney  . Number of children: 3  . Years of education: Not on file  . Highest education level: Some college, no degree  Occupational History    Comment: full time  Social Needs  . Financial resource strain: Somewhat hard  . Food insecurity:    Worry: Sometimes true    Inability: Sometimes true  . Transportation needs:    Medical: No    Non-medical: No  Tobacco Use  . Smoking status: Never Smoker  . Smokeless tobacco: Never Used  Substance and Sexual Activity  . Alcohol use: Yes    Alcohol/week: 0.0 standard drinks    Comment: social  . Drug use: Yes    Types: Marijuana    Comment: 6 mths ago  . Sexual activity: Yes    Partners: Male    Birth  control/protection: Surgical    Comment: Tubal ligation   Lifestyle  . Physical activity:    Days per week: 0 days    Minutes per session: 0 min  . Stress: Very much  Relationships  . Social connections:    Talks on phone: More than three times a week    Gets together: Never    Attends religious service: Never    Active member of club or organization: No    Attends meetings of clubs or organizations: Never    Relationship status: Married  Other Topics Concern  . Not on file  Social History Narrative  . Not on file    Allergies:  Allergies  Allergen Reactions  . Neuromuscular Blocking Agents   . Hydrocodone-Acetaminophen Nausea Only    Metabolic Disorder Labs: No results found for: HGBA1C, MPG No results found for: PROLACTIN No results found for: CHOL, TRIG, HDL, CHOLHDL, VLDL, LDLCALC No results found for: TSH  Therapeutic Level Labs: No results found for: LITHIUM No results found for: VALPROATE No components found for:  CBMZ  Current Medications: Current Outpatient Medications  Medication Sig Dispense Refill  . ARIPiprazole (ABILIFY) 5 MG tablet Take 1 tablet (5 mg total) by mouth daily. 90 tablet 0  . doxepin (SINEQUAN) 10 MG capsule Take 1-2 capsules (10-20 mg total) by mouth at bedtime. 180 capsule 0  . DULoxetine (CYMBALTA) 30 MG capsule Take 1 capsule (30 mg total) by mouth daily. Take with 60 mg 90 capsule 0  . DULoxetine (CYMBALTA) 60 MG capsule Take 1 capsule (60 mg total) by mouth daily. To be combined with 30 mg 90 capsule 0  . estradiol (ESTRACE VAGINAL) 0.1 MG/GM vaginal cream Place 1 g vaginally 3 (three) times a week. (Patient not taking: Reported on 04/01/2019) 42.5 g 3  . ibuprofen (ADVIL,MOTRIN) 800 MG tablet Take 800 mg by mouth every 8 (eight) hours as needed.     No current facility-administered medications for this visit.      Musculoskeletal: Strength & Muscle Tone: within normal limits Gait & Station: normal Patient leans:  N/A  Psychiatric Specialty Exam: Review of Systems  Psychiatric/Behavioral: Positive for depression.  All other systems reviewed and are negative.   There were no vitals taken for this visit.There is no height or weight on file to calculate BMI.  General Appearance: Casual  Eye Contact:  Fair  Speech:  Clear and Coherent  Volume:  Normal  Mood:  Depressed  Affect:  Congruent  Thought Process:  Goal Directed and Descriptions of Associations: Intact  Orientation:  Full (Time, Place, and Person)  Thought Content: Logical   Suicidal Thoughts:  No  Homicidal Thoughts:  No  Memory:  Immediate;   Fair Recent;   Fair Remote;   Fair  Judgement:  Fair  Insight:  Fair  Psychomotor Activity:  Normal  Concentration:  Concentration: Fair  and Attention Span: Fair  Recall:  [employer] of Knowledge: Fair  Language: Fair  Akathisia:  No  Handed:  Right  AIMS (if indicated): Denies tremors, rigidity, stiffness  Assets:  Communication Skills Desire for Improvement Housing Social Support  ADL's:  Intact  Cognition: WNL  Sleep:  improving   Screenings: PHQ2-9     Office Visit from 10/11/2016 in [facility] Office Visit from 06/26/2016 in [facility] Office Visit from 03/13/2016 in [facility] Office Visit from 11/14/2015 in [facility] Office Visit from 08/18/2015 in [facility]  PHQ-2 Total Score  _0 PHQ-9 Total Score  8  -  -  8  -       Assessment and Plan: Jamie [name] is a 53 year old Caucasian female who has a history of depression, chronic pain, was evaluated by telemedicine today.  Patient continues to struggle with some depressive symptoms although improving.  Patient will continue to benefit from medication changes as well as psychotherapy sessions.  Plan MDD- some progress Cymbalta 90 mg p.o. daily Increase Abilify to 5 mg p.o. daily.  For insomnia-improving Doxepin 10  to 20 mg p.o. nightly.  For chronic pain syndrome-she will continue to work with her primary provider.  Patient to continue psychotherapy sessions with Ms. Peacock.  Follow-up in clinic in 1 month or sooner if needed.  Appointment scheduled for June 5 at 9:15 AM.  I have spent atleast 15 minutes non face to face with patient today. More than 50 % of the time was spent for psychoeducation and supportive psychotherapy and care coordination.  This note was generated in part or whole with voice recognition software. Voice recognition is usually quite accurate but there are transcription errors that can and very often do occur. I apologize for any typographical errors that were not detected and corrected.          Ursula Alert, MD 05/11/2019, 9:14 AM

## 2019-05-21 NOTE — Progress Notes (Signed)
Virtual Visit via Telephone Note  I connected with Jamie [name] on 04/27/19 at  8:00 AM EDT by telephone and verified that I am speaking with the correct person using two identifiers.  Location: Patient: home Provider: office   I discussed the limitations, risks, security and privacy concerns of performing an evaluation and management service by telephone and the availability of in person appointments. I also discussed with the patient that there may be a patient responsible charge related to this service. The patient expressed understanding and agreed to proceed.    Participation Level: Active  Type of Therapy: Individual Therapy  Treatment Goals addressed: Anxiety and Coping  Interventions: CBT and Motivational Interviewing  Summary: Jamie [name] is a 53 y.o. female who presents with symptoms of diagnosis.  Therapist expressed the importance of providing structure in the home in order to tone down the conflict and inappropriate communication.  Therapist encouraged Patient to set aside some time to do something that they all like to do that will increase their positive interactions as well as communication. Therapist explained to Patient that having positive interactions in the home could have significant impact on the children's ability to positively interact with peers and other adults in other settings as well. Therapist inquired about Patient's ability to communicate with children in a positive manner by discussing how she and the children converse on a daily basis.   Suicidal/Homicidal: No  Plan: Return again in 2 weeks.  Diagnosis: Axis I: depression    Axis II: No diagnosis   I discussed the assessment and treatment plan with the patient. The patient was provided an opportunity to ask questions and all were answered. The patient agreed with the plan and demonstrated an understanding of the instructions.   The patient was advised to call back or seek an in-person  evaluation if the symptoms worsen or if the condition fails to improve as anticipated.  I provided 60 minutes of non-face-to-face time during this encounter.   Lubertha South, LCSW

## 2019-06-04 ENCOUNTER — Encounter: Payer: Self-pay | Admitting: Psychiatry

## 2019-06-04 ENCOUNTER — Other Ambulatory Visit: Payer: Self-pay

## 2019-06-04 ENCOUNTER — Ambulatory Visit (INDEPENDENT_AMBULATORY_CARE_PROVIDER_SITE_OTHER): Payer: PRIVATE HEALTH INSURANCE | Admitting: Psychiatry

## 2019-06-04 DIAGNOSIS — F411 Generalized anxiety disorder: Secondary | ICD-10-CM | POA: Diagnosis not present

## 2019-06-04 DIAGNOSIS — F5105 Insomnia due to other mental disorder: Secondary | ICD-10-CM | POA: Diagnosis not present

## 2019-06-04 DIAGNOSIS — F331 Major depressive disorder, recurrent, moderate: Secondary | ICD-10-CM

## 2019-06-04 MED ORDER — DULOXETINE HCL 60 MG PO CPEP: 60.0000 mg | ORAL_CAPSULE | Freq: Two times a day (BID) | ORAL | 0 refills | Status: DC

## 2019-06-04 MED ORDER — DOXEPIN HCL 10 MG PO CAPS: 10.0000 mg | ORAL_CAPSULE | Freq: Every day | ORAL | 0 refills | Status: DC

## 2019-06-04 NOTE — Progress Notes (Signed)
Virtual Visit via Video Note  I connected with [name] on 06/04/19 at  9:15 AM EDT by a video enabled telemedicine application and verified that I am speaking with the correct person using two identifiers.   I discussed the limitations of evaluation and management by telemedicine and the availability of in person appointments. The patient expressed understanding and agreed to proceed.    I discussed the assessment and treatment plan with the patient. The patient was provided an opportunity to ask questions and all were answered. The patient agreed with the plan and demonstrated an understanding of the instructions.   The patient was advised to call back or seek an in-person evaluation if the symptoms worsen or if the condition fails to improve as anticipated.  [place] MD OP Progress Note  06/04/2019 12:54 PM Jamie [name]  MRN:  [id number]  Chief Complaint:  Chief Complaint    Follow-up     HPI: Jamie [name] is a 53 year old Caucasian female, married, lives in [place], employed, has a history of MDD, insomnia, chronic pain was evaluated by telemedicine today.  Patient today reports she has been struggling with some racing thoughts, worrying about different things, since the past several days.  She reports she was feeling better on the increased dosage of Abilify for a few days however her mood symptoms are worsening again.  She reports her children are going to be out of school next week.  Her husband has to go back to work this week.  She wonders if all that change is contributing to her worsening mood symptoms.  She reports she had to call out from work last night since she was not doing well.  Patient reports she continues to work night shift and is coping with it.  She reports she is able to sleep during the day.  Her older daughter is going to be 55 years old soon.  She helps out with the younger 52 year old.    Patient reports she has not had a chance to speak to Ms. Peacock in a  few weeks.  She agrees to make an appointment soon.  Patient denies any suicidality, homicidality or perceptual disturbances.   Visit Diagnosis:    ICD-10-CM   1. MDD (major depressive disorder), recurrent episode, moderate (HCC) F33.1 DULoxetine (CYMBALTA) 60 MG capsule  2. GAD (generalized anxiety disorder) F41.1   3. Insomnia due to mental condition F51.05 doxepin (SINEQUAN) 10 MG capsule    Past Psychiatric History: I have reviewed past psychiatric history from progress notes on 03/28/2019  Past Medical History:  Past Medical History:  Diagnosis Date  . Anxiety   . Brachial neuritis   . Chronic neck pain   . Chronic pain not due to malignancy   . Depressive disorder    followed by Dr. Alethia Berthold.  . Disturbance, sleep   . Elevated blood pressure reading without diagnosis of hypertension   . Embolism and thrombosis of splenic artery   . H/O thrombophlebitis   . Infarction of spleen   . Lumbosacral neuritis   . Obesity, Class II, BMI 35-39.9, with comorbidity     Past Surgical History:  Procedure Laterality Date  . CESAREAN SECTION  09/24/2010  . TONSILLECTOMY AND ADENOIDECTOMY  09/24/2010  . TUBAL LIGATION  09/24/2010    Family Psychiatric History: I have reviewed family history from progress notes on 03/28/2019  Family History:  Family History  Problem Relation Age of Onset  . Anxiety disorder Mother   . Depression Mother   .  Alcohol abuse Brother     Social History: Reviewed social history from progress notes on 03/28/2019 Social History   Socioeconomic History  . Marital status: Married    Spouse name: rodney  . Number of children: 3  . Years of education: Not on file  . Highest education level: Some college, no degree  Occupational History    Comment: full time  Social Needs  . Financial resource strain: Somewhat hard  . Food insecurity:    Worry: Sometimes true    Inability: Sometimes true  . Transportation needs:    Medical: No    Non-medical:  No  Tobacco Use  . Smoking status: Never Smoker  . Smokeless tobacco: Never Used  Substance and Sexual Activity  . Alcohol use: Yes    Alcohol/week: 0.0 standard drinks    Comment: social  . Drug use: Yes    Types: Marijuana    Comment: 6 mths ago  . Sexual activity: Yes    Partners: Male    Birth control/protection: Surgical    Comment: Tubal ligation   Lifestyle  . Physical activity:    Days per week: 0 days    Minutes per session: 0 min  . Stress: Very much  Relationships  . Social connections:    Talks on phone: More than three times a week    Gets together: Never    Attends religious service: Never    Active member of club or organization: No    Attends meetings of clubs or organizations: Never    Relationship status: Married  Other Topics Concern  . Not on file  Social History Narrative  . Not on file    Allergies:  Allergies  Allergen Reactions  . Neuromuscular Blocking Agents   . Hydrocodone-Acetaminophen Nausea Only    Metabolic Disorder Labs: No results found for: HGBA1C, MPG No results found for: PROLACTIN No results found for: CHOL, TRIG, HDL, CHOLHDL, VLDL, LDLCALC No results found for: TSH  Therapeutic Level Labs: No results found for: LITHIUM No results found for: VALPROATE No components found for:  CBMZ  Current Medications: Current Outpatient Medications  Medication Sig Dispense Refill  . ARIPiprazole (ABILIFY) 5 MG tablet Take 1 tablet (5 mg total) by mouth daily. 90 tablet 0  . doxepin (SINEQUAN) 10 MG capsule Take 1-2 capsules (10-20 mg total) by mouth at bedtime. 180 capsule 0  . DULoxetine (CYMBALTA) 60 MG capsule Take 1 capsule (60 mg total) by mouth 2 (two) times daily. 180 capsule 0  . estradiol (ESTRACE VAGINAL) 0.1 MG/GM vaginal cream Place 1 g vaginally 3 (three) times a week. (Patient not taking: Reported on 04/01/2019) 42.5 g 3  . ibuprofen (ADVIL,MOTRIN) 800 MG tablet Take 800 mg by mouth every 8 (eight) hours as needed.     No  current facility-administered medications for this visit.      Musculoskeletal: Strength & Muscle Tone: within normal limits Gait & Station: normal Patient leans: N/A  Psychiatric Specialty Exam: Review of Systems  Psychiatric/Behavioral: Positive for depression. The patient is nervous/anxious.   All other systems reviewed and are negative.   There were no vitals taken for this visit.There is no height or weight on file to calculate BMI.  General Appearance: Casual  Eye Contact:  Fair  Speech:  Clear and Coherent  Volume:  Normal  Mood:  Anxious and Depressed  Affect:  Congruent  Thought Process:  Goal Directed and Descriptions of Associations: Intact  Orientation:  Full (Time, Place, and Person)  Thought Content: Logical   Suicidal Thoughts:  No  Homicidal Thoughts:  No  Memory:  Immediate;   Fair Recent;   Fair Remote;   Fair  Judgement:  Fair  Insight:  Fair  Psychomotor Activity:  Normal  Concentration:  Concentration: Fair and Attention Span: Fair  Recall:  [employer] of Knowledge: Fair  Language: Fair  Akathisia:  No  Handed:  Right  AIMS (if indicated): denies tremors, rigidity  Assets:  Communication Skills Desire for Improvement Social Support  ADL's:  Intact  Cognition: WNL  Sleep:  Fair   Screenings: PHQ2-9     [facility] Visit from 10/11/2016 in [facility] Office Visit from 06/26/2016 in [facility] Office Visit from 03/13/2016 in [facility] Office Visit from 11/14/2015 in [facility] Office Visit from 08/18/2015 in [facility]  PHQ-2 Total Score  2  1  1  2  1   PHQ-9 Total Score  8  -  -  8  -       Assessment and Plan: Jessly is a 53 year old CF, who has a history of MDD, insomnia ,chronic pain was evaluated by telemedicine today. Patient continues to struggle with mood symptoms and will benefit from medication readjustments. Pt with several psychosocial  stressors of relationship struggles , pandemic outbreak and job stressors. Plan as noted below.  Plan MDD- unstable Increase Cymbalta to 60 mg po bid. Abilify 5 mg po daily.  For Insomnia- improing Doxepin 10 to 20 mg p.o. nightly.  For chronic pain syndrome-we will continue to work with her primary provider.  Patient to continue psychotherapy sessions with Ms. Peacock.  Discussed with patient to call back to schedule an appointment with Ms. Peacock.  Follow-up in clinic in 3 to 4 weeks or sooner if needed.  Appointment scheduled for July 8 at 8:45 in the morning.  I have spent atleast 15 minutes non face to face with patient today. More than 50 % of the time was spent for psychoeducation and supportive psychotherapy and care coordination.  This note was generated in part or whole with voice recognition software. Voice recognition is usually quite accurate but there are transcription errors that can and very often do occur. I apologize for any typographical errors that were not detected and corrected.        Ursula Alert, MD 06/04/2019, 12:54 PM

## 2019-07-07 ENCOUNTER — Ambulatory Visit (INDEPENDENT_AMBULATORY_CARE_PROVIDER_SITE_OTHER): Payer: PRIVATE HEALTH INSURANCE | Admitting: Psychiatry

## 2019-07-07 ENCOUNTER — Other Ambulatory Visit: Payer: Self-pay

## 2019-07-07 DIAGNOSIS — Z5329 Procedure and treatment not carried out because of patient's decision for other reasons: Secondary | ICD-10-CM

## 2019-07-07 NOTE — Progress Notes (Signed)
No response to calls 

## 2019-08-07 ENCOUNTER — Other Ambulatory Visit: Payer: Self-pay | Admitting: Psychiatry

## 2019-08-07 DIAGNOSIS — F331 Major depressive disorder, recurrent, moderate: Secondary | ICD-10-CM

## 2019-08-25 ENCOUNTER — Encounter: Payer: Self-pay | Admitting: Psychiatry

## 2019-08-25 ENCOUNTER — Other Ambulatory Visit: Payer: Self-pay

## 2019-08-25 ENCOUNTER — Ambulatory Visit (INDEPENDENT_AMBULATORY_CARE_PROVIDER_SITE_OTHER): Payer: PRIVATE HEALTH INSURANCE | Admitting: Psychiatry

## 2019-08-25 DIAGNOSIS — F411 Generalized anxiety disorder: Secondary | ICD-10-CM | POA: Diagnosis not present

## 2019-08-25 DIAGNOSIS — F5105 Insomnia due to other mental disorder: Secondary | ICD-10-CM

## 2019-08-25 DIAGNOSIS — F331 Major depressive disorder, recurrent, moderate: Secondary | ICD-10-CM | POA: Diagnosis not present

## 2019-08-25 MED ORDER — ARIPIPRAZOLE 5 MG PO TABS: 5.0000 mg | ORAL_TABLET | Freq: Every day | ORAL | 0 refills | Status: DC

## 2019-08-25 MED ORDER — DULOXETINE HCL 60 MG PO CPEP: 60.0000 mg | ORAL_CAPSULE | Freq: Two times a day (BID) | ORAL | 0 refills | Status: DC

## 2019-08-25 MED ORDER — DOXEPIN HCL 10 MG PO CAPS: 10.0000 mg | ORAL_CAPSULE | Freq: Every day | ORAL | 0 refills | Status: DC

## 2019-08-25 NOTE — Progress Notes (Signed)
Virtual Visit via Video Note  I connected with Jamie [name] on 08/25/19 at  4:45 PM EDT by a video enabled telemedicine application and verified that I am speaking with the correct person using two identifiers.   I discussed the limitations of evaluation and management by telemedicine and the availability of in person appointments. The patient expressed understanding and agreed to proceed.     I discussed the assessment and treatment plan with the patient. The patient was provided an opportunity to ask questions and all were answered. The patient agreed with the plan and demonstrated an understanding of the instructions.   The patient was advised to call back or seek an in-person evaluation if the symptoms worsen or if the condition fails to improve as anticipated.   [place] MD OP Progress Note  08/25/2019 5:18 PM Jamie [name]  MRN:  [id number]  Chief Complaint:  Chief Complaint    Follow-up     HPI: Jamie [name] is a 53 year old Caucasian female, married, lives in [place], employed, has a history of MDD, insomnia, chronic pain was evaluated by telemedicine today.  Patient today reports she is anxious about the current situation especially at work.  She however has been coping okay.  She reports sleep is good.  She however does report some fatigue and tiredness during the day.  She wonders whether it is due to her medications.  She is compliant on her medications as prescribed.  She has been taking the Cymbalta together as a one-time dosage.  Advised her to divide it in to two dosages to see if that will help with the tiredness during the day.  She denies any suicidality homicidality or perceptual disturbances.  She continues to be motivated to stay in therapy.  She denies any other concerns today. Visit Diagnosis:    ICD-10-CM   1. MDD (major depressive disorder), recurrent episode, moderate (HCC)  F33.1 DULoxetine (CYMBALTA) 60 MG capsule    ARIPiprazole (ABILIFY) 5 MG tablet   2. GAD (generalized anxiety disorder)  F41.1   3. Insomnia due to mental condition  F51.05 doxepin (SINEQUAN) 10 MG capsule    Past Psychiatric History: I have reviewed past psychiatric history from my progress note on 03/28/2019. Past Medical History:  Past Medical History:  Diagnosis Date  . Anxiety   . Brachial neuritis   . Chronic neck pain   . Chronic pain not due to malignancy   . Depressive disorder    followed by Dr. Alethia Berthold.  . Disturbance, sleep   . Elevated blood pressure reading without diagnosis of hypertension   . Embolism and thrombosis of splenic artery   . H/O thrombophlebitis   . Infarction of spleen   . Lumbosacral neuritis   . Obesity, Class II, BMI 35-39.9, with comorbidity     Past Surgical History:  Procedure Laterality Date  . CESAREAN SECTION  09/24/2010  . TONSILLECTOMY AND ADENOIDECTOMY  09/24/2010  . TUBAL LIGATION  09/24/2010    Family Psychiatric History: I have reviewed family psychiatric history from my progress note on 03/28/2019.  Family History:  Family History  Problem Relation Age of Onset  . Anxiety disorder Mother   . Depression Mother   . Alcohol abuse Brother     Social History: I have reviewed social history from my progress note on 03/28/2019. Social History   Socioeconomic History  . Marital status: Married    Spouse name: rodney  . Number of children: 3  . Years of education: Not on  file  . Highest education level: Some college, no degree  Occupational History    Comment: full time  Social Needs  . Financial resource strain: Somewhat hard  . Food insecurity    Worry: Sometimes true    Inability: Sometimes true  . Transportation needs    Medical: No    Non-medical: No  Tobacco Use  . Smoking status: Never Smoker  . Smokeless tobacco: Never Used  Substance and Sexual Activity  . Alcohol use: Yes    Alcohol/week: 0.0 standard drinks    Comment: social  . Drug use: Yes    Types: Marijuana    Comment: 6  mths ago  . Sexual activity: Yes    Partners: Male    Birth control/protection: Surgical    Comment: Tubal ligation   Lifestyle  . Physical activity    Days per week: 0 days    Minutes per session: 0 min  . Stress: Very much  Relationships  . Social Herbalist on phone: More than three times a week    Gets together: Never    Attends religious service: Never    Active member of club or organization: No    Attends meetings of clubs or organizations: Never    Relationship status: Married  Other Topics Concern  . Not on file  Social History Narrative  . Not on file    Allergies:  Allergies  Allergen Reactions  . Neuromuscular Blocking Agents   . Hydrocodone-Acetaminophen Nausea Only    Metabolic Disorder Labs: No results found for: HGBA1C, MPG No results found for: PROLACTIN No results found for: CHOL, TRIG, HDL, CHOLHDL, VLDL, LDLCALC No results found for: TSH  Therapeutic Level Labs: No results found for: LITHIUM No results found for: VALPROATE No components found for:  CBMZ  Current Medications: Current Outpatient Medications  Medication Sig Dispense Refill  . ARIPiprazole (ABILIFY) 5 MG tablet Take 1 tablet (5 mg total) by mouth daily. 90 tablet 0  . doxepin (SINEQUAN) 10 MG capsule Take 1-2 capsules (10-20 mg total) by mouth at bedtime. 180 capsule 0  . DULoxetine (CYMBALTA) 60 MG capsule Take 1 capsule (60 mg total) by mouth 2 (two) times daily. 180 capsule 0  . estradiol (ESTRACE VAGINAL) 0.1 MG/GM vaginal cream Place 1 g vaginally 3 (three) times a week. (Patient not taking: Reported on 04/01/2019) 42.5 g 3  . ibuprofen (ADVIL,MOTRIN) 800 MG tablet Take 800 mg by mouth every 8 (eight) hours as needed.     No current facility-administered medications for this visit.      Musculoskeletal: Strength & Muscle Tone: UTA Gait & Station: normal Patient leans: N/A  Psychiatric Specialty Exam: Review of Systems  Psychiatric/Behavioral: The patient is  nervous/anxious.   All other systems reviewed and are negative.   There were no vitals taken for this visit.There is no height or weight on file to calculate BMI.  General Appearance: Casual  Eye Contact:  Fair  Speech:  Clear and Coherent  Volume:  Normal  Mood:  Anxious  Affect:  Congruent  Thought Process:  Goal Directed and Descriptions of Associations: Intact  Orientation:  Full (Time, Place, and Person)  Thought Content: Logical   Suicidal Thoughts:  No  Homicidal Thoughts:  No  Memory:  Immediate;   Fair Recent;   Fair Remote;   Fair  Judgement:  Fair  Insight:  Fair  Psychomotor Activity:  Normal  Concentration:  Concentration: Fair and Attention Span: Fair  Recall:  [place] of Knowledge: Fair  Language: Fair  Akathisia:  No  Handed:  Right  AIMS (if indicated):Denies tremors, rigidity  Assets:  Communication Skills Desire for Improvement Housing Intimacy Social Support Talents/Skills  ADL's:  Intact  Cognition: WNL  Sleep:  Fair   Screenings: PHQ2-9     Office Visit from 10/11/2016 in [facility] Office Visit from 06/26/2016 in [facility] Office Visit from 03/13/2016 in [facility] Office Visit from 11/14/2015 in [facility] Office Visit from 08/18/2015 in [facility]  PHQ-2 Total Score  2  1  1  2  1   PHQ-9 Total Score  8  -  -  8  -       Assessment and Plan: Chrissi is a 53 year old Caucasian female who has a history of MDD, insomnia, chronic pain was evaluated by telemedicine today.  Patient is currently doing better with the current medication regimen with regards to her mood symptoms.  She will continue psychotherapy sessions.  Plan as noted below.  Plan MDD-improving Cymbalta 60 mg p.o. twice daily. Abilify 5 mg p.o. daily  Insomnia-improving Doxepin 10 to 20 mg p.o. nightly  For chronic pain syndrome-she will continue to work with her primary  provider.  Patient advised to call the clinic to schedule an appointment with Ms. Peacock.  Follow-up in clinic in 1 to 2 months or sooner if needed.  October 21 at 4:30 PM  I have spent atleast 15 minutes non face to face with patient today. More than 50 % of the time was spent for psychoeducation and supportive psychotherapy and care coordination.  This note was generated in part or whole with voice recognition software. Voice recognition is usually quite accurate but there are transcription errors that can and very often do occur. I apologize for any typographical errors that were not detected and corrected.        Ursula Alert, MD 08/25/2019, 5:18 PM

## 2019-10-20 ENCOUNTER — Other Ambulatory Visit: Payer: Self-pay

## 2019-10-20 ENCOUNTER — Ambulatory Visit (INDEPENDENT_AMBULATORY_CARE_PROVIDER_SITE_OTHER): Payer: PRIVATE HEALTH INSURANCE | Admitting: Psychiatry

## 2019-10-20 ENCOUNTER — Encounter: Payer: Self-pay | Admitting: Psychiatry

## 2019-10-20 DIAGNOSIS — F3341 Major depressive disorder, recurrent, in partial remission: Secondary | ICD-10-CM

## 2019-10-20 DIAGNOSIS — F5105 Insomnia due to other mental disorder: Secondary | ICD-10-CM | POA: Diagnosis not present

## 2019-10-20 DIAGNOSIS — F411 Generalized anxiety disorder: Secondary | ICD-10-CM

## 2019-10-20 NOTE — Progress Notes (Signed)
Virtual Visit via Video Note  I connected with Jamie [name] on 10/20/19 at  4:30 PM EDT by a video enabled telemedicine application and verified that I am speaking with the correct person using two identifiers.   I discussed the limitations of evaluation and management by telemedicine and the availability of in person appointments. The patient expressed understanding and agreed to proceed.    I discussed the assessment and treatment plan with the patient. The patient was provided an opportunity to ask questions and all were answered. The patient agreed with the plan and demonstrated an understanding of the instructions.   The patient was advised to call back or seek an in-person evaluation if the symptoms worsen or if the condition fails to improve as anticipated.   [place] MD OP Progress Note  10/20/2019 5:08 PM Jamie [name]  MRN:  [id number]  Chief Complaint:  Chief Complaint    Follow-up     HPI: Jamie [name] is a 53 year old Caucasian female, married, lives in [place], employed, has a history of MDD, insomnia, chronic pain was evaluated by telemedicine today. A video call was initiated however due to correction problem it had to be changed to a phone call.  Patient today reports she is currently doing well with regards to her mood.  She denies any significant anxiety or depressive symptoms.  She reports she is compliant on her medications like Abilify and Cymbalta.  She denies any side effects.  She reports her current combination of medication is beneficial and she wants to stay on same.  She reports she works night shifts.  She reports she enjoys working night shift and that way she can avoid all the drama during the day.  She reports she has been able to sleep during the day to some extent.  She reports she has been helping her children who are currently getting virtual schooling.  She has has to wake up in between to help them .  She however reports she feels well rested when she gets  up even though her sleep is broken .  Patient denies any suicidality, homicidality or perceptual disturbances.  Patient has not made an appointment with her therapist yet however she agrees to call her therapist soon to schedule an appointment.   Patient denies any other concerns today.   Visit Diagnosis:    ICD-10-CM   1. MDD (major depressive disorder), recurrent, in partial remission (Jamie [name])  F33.41   2. GAD (generalized anxiety disorder)  F41.1   3. Insomnia due to mental condition  F51.05     Past Psychiatric History: I have reviewed past psychiatric history from my progress note on 03/28/2019.  Past Medical History:  Past Medical History:  Diagnosis Date  . Anxiety   . Brachial neuritis   . Chronic neck pain   . Chronic pain not due to malignancy   . Depressive disorder    followed by Dr. Alethia Berthold.  . Disturbance, sleep   . Elevated blood pressure reading without diagnosis of hypertension   . Embolism and thrombosis of splenic artery   . H/O thrombophlebitis   . Infarction of spleen   . Lumbosacral neuritis   . Obesity, Class II, BMI 35-39.9, with comorbidity     Past Surgical History:  Procedure Laterality Date  . CESAREAN SECTION  09/24/2010  . TONSILLECTOMY AND ADENOIDECTOMY  09/24/2010  . TUBAL LIGATION  09/24/2010    Family Psychiatric History: I have reviewed family psychiatric history from my progress note on  03/28/2019  Family History:  Family History  Problem Relation Age of Onset  . Anxiety disorder Mother   . Depression Mother   . Alcohol abuse Brother     Social History: Reviewed social history from my progress note on 03/28/2019 Social History   Socioeconomic History  . Marital status: Married    Spouse name: rodney  . Number of children: 3  . Years of education: Not on file  . Highest education level: Some college, no degree  Occupational History    Comment: full time  Social Needs  . Financial resource strain: Somewhat hard  . Food  insecurity    Worry: Sometimes true    Inability: Sometimes true  . Transportation needs    Medical: No    Non-medical: No  Tobacco Use  . Smoking status: Never Smoker  . Smokeless tobacco: Never Used  Substance and Sexual Activity  . Alcohol use: Yes    Alcohol/week: 0.0 standard drinks    Comment: social  . Drug use: Yes    Types: Marijuana    Comment: 6 mths ago  . Sexual activity: Yes    Partners: Male    Birth control/protection: Surgical    Comment: Tubal ligation   Lifestyle  . Physical activity    Days per week: 0 days    Minutes per session: 0 min  . Stress: Very much  Relationships  . Social Herbalist on phone: More than three times a week    Gets together: Never    Attends religious service: Never    Active member of club or organization: No    Attends meetings of clubs or organizations: Never    Relationship status: Married  Other Topics Concern  . Not on file  Social History Narrative  . Not on file    Allergies:  Allergies  Allergen Reactions  . Neuromuscular Blocking Agents   . Hydrocodone-Acetaminophen Nausea Only    Metabolic Disorder Labs: No results found for: HGBA1C, MPG No results found for: PROLACTIN No results found for: CHOL, TRIG, HDL, CHOLHDL, VLDL, LDLCALC No results found for: TSH  Therapeutic Level Labs: No results found for: LITHIUM No results found for: VALPROATE No components found for:  CBMZ  Current Medications: Current Outpatient Medications  Medication Sig Dispense Refill  . ARIPiprazole (ABILIFY) 5 MG tablet Take 1 tablet (5 mg total) by mouth daily. 90 tablet 0  . doxepin (SINEQUAN) 10 MG capsule Take 1-2 capsules (10-20 mg total) by mouth at bedtime. 180 capsule 0  . DULoxetine (CYMBALTA) 60 MG capsule Take 1 capsule (60 mg total) by mouth 2 (two) times daily. 180 capsule 0  . estradiol (ESTRACE VAGINAL) 0.1 MG/GM vaginal cream Place 1 g vaginally 3 (three) times a week. (Patient not taking: Reported on  04/01/2019) 42.5 g 3  . ibuprofen (ADVIL,MOTRIN) 800 MG tablet Take 800 mg by mouth every 8 (eight) hours as needed.     No current facility-administered medications for this visit.      Musculoskeletal: Strength & Muscle Tone: UTA Gait & Station: Reports as WNL Patient leans: N/A  Psychiatric Specialty Exam: Review of Systems  Psychiatric/Behavioral: The patient is nervous/anxious.   All other systems reviewed and are negative.   There were no vitals taken for this visit.There is no height or weight on file to calculate BMI.  General Appearance: UTA  Eye Contact:  UTA  Speech:  Clear and Coherent  Volume:  Normal  Mood:  Anxious improving  Affect:  UTA  Thought Process:  Goal Directed and Descriptions of Associations: Intact  Orientation:  Full (Time, Place, and Person)  Thought Content: Logical   Suicidal Thoughts:  No  Homicidal Thoughts:  No  Memory:  Immediate;   Fair Recent;   Fair Remote;   Fair  Judgement:  Fair  Insight:  Fair  Psychomotor Activity:  UTA  Concentration:  Concentration: Fair and Attention Span: Fair  Recall:  [employer] of Knowledge: Fair  Language: Fair  Akathisia:  No  Handed:  Right  AIMS (if indicated):denies tremors, rigidity  Assets:  Communication Skills Desire for Improvement Housing Intimacy Social Support  ADL's:  Intact  Cognition: WNL  Sleep:  Restless   Screenings: PHQ2-9     Office Visit from 10/11/2016 in [facility] Office Visit from 06/26/2016 in [facility] Office Visit from 03/13/2016 in [facility] Office Visit from 11/14/2015 in [facility] Office Visit from 08/18/2015 in [facility]  PHQ-2 Total Score  2  1  1  2  1   PHQ-9 Total Score  8  -  -  8  -       Assessment and Plan: Jamie [name] is a 53 year old Caucasian female who has a history of MDD, insomnia, chronic pain was evaluated by telemedicine today.  She is currently  doing well on current medication regimen.  Plan MDD- stable Cymbalta 60 mg p.o. twice daily Abilify 5 mg p.o. daily  Insomnia- stable Doxepin 10 to 20 mg p.o. nightly  Chronic pain syndrome-she will continue to work with her primary care provider.  Patient was advised to schedule an appointment with Ms. Peacock last session-pending .   Follow-up in clinic in 1 to 2 months or sooner if needed.  December 23 at 4:45 PM  I have spent atleast 15 minutes non face to face with patient today. More than 50 % of the time was spent for psychoeducation and supportive psychotherapy and care coordination. This note was generated in part or whole with voice recognition software. Voice recognition is usually quite accurate but there are transcription errors that can and very often do occur. I apologize for any typographical errors that were not detected and corrected.       Ursula Alert, MD 10/20/2019, 5:08 PM

## 2019-12-08 DIAGNOSIS — Z20828 Contact with and (suspected) exposure to other viral communicable diseases: Secondary | ICD-10-CM | POA: Diagnosis not present

## 2019-12-17 ENCOUNTER — Other Ambulatory Visit: Payer: Self-pay | Admitting: Psychiatry

## 2019-12-17 DIAGNOSIS — F331 Major depressive disorder, recurrent, moderate: Secondary | ICD-10-CM

## 2019-12-21 ENCOUNTER — Other Ambulatory Visit: Payer: Self-pay

## 2019-12-21 ENCOUNTER — Encounter: Payer: Self-pay | Admitting: Psychiatry

## 2019-12-21 ENCOUNTER — Ambulatory Visit (INDEPENDENT_AMBULATORY_CARE_PROVIDER_SITE_OTHER): Payer: PRIVATE HEALTH INSURANCE | Admitting: Psychiatry

## 2019-12-21 DIAGNOSIS — F411 Generalized anxiety disorder: Secondary | ICD-10-CM

## 2019-12-21 DIAGNOSIS — F331 Major depressive disorder, recurrent, moderate: Secondary | ICD-10-CM

## 2019-12-21 DIAGNOSIS — F5105 Insomnia due to other mental disorder: Secondary | ICD-10-CM | POA: Diagnosis not present

## 2019-12-21 MED ORDER — GABAPENTIN 300 MG PO CAPS: 300.0000 mg | ORAL_CAPSULE | Freq: Every day | ORAL | 1 refills | Status: DC

## 2019-12-21 MED ORDER — DOXEPIN HCL 10 MG PO CAPS: 10.0000 mg | ORAL_CAPSULE | Freq: Every day | ORAL | 0 refills | Status: DC

## 2019-12-21 MED ORDER — ARIPIPRAZOLE 5 MG PO TABS: 5.0000 mg | ORAL_TABLET | Freq: Every day | ORAL | 0 refills | Status: DC

## 2019-12-21 NOTE — Progress Notes (Signed)
Virtual Visit via Video Note  I connected with Jamie [name] on 12/21/19 at  4:45 PM EST by a video enabled telemedicine application and verified that I am speaking with the correct person using two identifiers.   I discussed the limitations of evaluation and management by telemedicine and the availability of in person appointments. The patient expressed understanding and agreed to proceed.     I discussed the assessment and treatment plan with the patient. The patient was provided an opportunity to ask questions and all were answered. The patient agreed with the plan and demonstrated an understanding of the instructions.   The patient was advised to call back or seek an in-person evaluation if the symptoms worsen or if the condition fails to improve as anticipated.   [facility] MD OP Progress Note  12/21/2019 5:30 PM Jamie [name]  MRN:  [id number]  Chief Complaint:  Chief Complaint    Follow-up     HPI: Jamie [name] is a 53 year old Caucasian female, married, lives in [place], employed, has a history of MDD, insomnia, chronic pain was evaluated by telemedicine today.  Patient today reports she has noticed irritability and increased anxiety.  She has been getting angry easily.  Patient reports she does not feel as depressed as she used to before.  She reports sleep is good.  She works third shift and is able to sleep during the day.  Patient continues to have relationship struggles with her husband.  She reports she has been talking to her pastor which helps.  She reports her husband is also scheduled to see the pastor soon.  Patient reports she does not have the finances to start working with a therapist at this time.  Patient denies any suicidality, homicidality or perceptual disturbances.  Patient denies any other concerns today. Visit Diagnosis:    ICD-10-CM   1. MDD (major depressive disorder), recurrent episode, moderate (HCC)  F33.1 ARIPiprazole (ABILIFY) 5 MG tablet     gabapentin (NEURONTIN) 300 MG capsule  2. GAD (generalized anxiety disorder)  F41.1   3. Insomnia due to mental condition  F51.05 doxepin (SINEQUAN) 10 MG capsule    Past Psychiatric History: I have reviewed past psychiatric history from my progress note on 03/28/2019.  Past Medical History:  Past Medical History:  Diagnosis Date  . Anxiety   . Brachial neuritis   . Chronic neck pain   . Chronic pain not due to malignancy   . Depressive disorder    followed by Dr. Alethia Berthold.  . Disturbance, sleep   . Elevated blood pressure reading without diagnosis of hypertension   . Embolism and thrombosis of splenic artery   . H/O thrombophlebitis   . Infarction of spleen   . Lumbosacral neuritis   . Obesity, Class II, BMI 35-39.9, with comorbidity     Past Surgical History:  Procedure Laterality Date  . CESAREAN SECTION  09/24/2010  . TONSILLECTOMY AND ADENOIDECTOMY  09/24/2010  . TUBAL LIGATION  09/24/2010    Family Psychiatric History: Reviewed family psychiatric history from my progress note on 03/28/2019.  Family History:  Family History  Problem Relation Age of Onset  . Anxiety disorder Mother   . Depression Mother   . Alcohol abuse Brother     Social History: Reviewed social history from my progress note on 03/28/2019. Social History   Socioeconomic History  . Marital status: Married    Spouse name: Jamie [name]  . Number of children: 3  . Years of education: Not on file  .  Highest education level: Some college, no degree  Occupational History    Comment: full time  Tobacco Use  . Smoking status: Never Smoker  . Smokeless tobacco: Never Used  Substance and Sexual Activity  . Alcohol use: Yes    Alcohol/week: 0.0 standard drinks    Comment: social  . Drug use: Yes    Types: Marijuana    Comment: 6 mths ago  . Sexual activity: Yes    Partners: Male    Birth control/protection: Surgical    Comment: Tubal ligation   Other Topics Concern  . Not on file  Social  History Narrative  . Not on file   Social Determinants of Health   Financial Resource Strain:   . Difficulty of Paying Living Expenses: Not on file  Food Insecurity:   . Worried About Charity fundraiser in the Last Year: Not on file  . Ran Out of Food in the Last Year: Not on file  Transportation Needs:   . Lack of Transportation (Medical): Not on file  . Lack of Transportation (Non-Medical): Not on file  Physical Activity:   . Days of Exercise per Week: Not on file  . Minutes of Exercise per Session: Not on file  Stress:   . Feeling of Stress : Not on file  Social Connections:   . Frequency of Communication with Friends and Family: Not on file  . Frequency of Social Gatherings with Friends and Family: Not on file  . Attends Religious Services: Not on file  . Active Member of Clubs or Organizations: Not on file  . Attends Archivist Meetings: Not on file  . Marital Status: Not on file    Allergies:  Allergies  Allergen Reactions  . Neuromuscular Blocking Agents   . Hydrocodone-Acetaminophen Nausea Only    Metabolic Disorder Labs: No results found for: HGBA1C, MPG No results found for: PROLACTIN No results found for: CHOL, TRIG, HDL, CHOLHDL, VLDL, LDLCALC No results found for: TSH  Therapeutic Level Labs: No results found for: LITHIUM No results found for: VALPROATE No components found for:  CBMZ  Current Medications: Current Outpatient Medications  Medication Sig Dispense Refill  . ARIPiprazole (ABILIFY) 5 MG tablet Take 1 tablet (5 mg total) by mouth daily. 90 tablet 0  . doxepin (SINEQUAN) 10 MG capsule Take 1-2 capsules (10-20 mg total) by mouth at bedtime. 180 capsule 0  . DULoxetine (CYMBALTA) 60 MG capsule Take 1 capsule by mouth twice daily 180 capsule 0  . estradiol (ESTRACE VAGINAL) 0.1 MG/GM vaginal cream Place 1 g vaginally 3 (three) times a week. (Patient not taking: Reported on 04/01/2019) 42.5 g 3  . gabapentin (NEURONTIN) 300 MG capsule  Take 1 capsule (300 mg total) by mouth daily. 30 capsule 1  . ibuprofen (ADVIL,MOTRIN) 800 MG tablet Take 800 mg by mouth every 8 (eight) hours as needed.     No current facility-administered medications for this visit.     Musculoskeletal: Strength & Muscle Tone: UTA Gait & Station: normal Patient leans: N/A  Psychiatric Specialty Exam: Review of Systems  Psychiatric/Behavioral: The patient is nervous/anxious.        Irritable  All other systems reviewed and are negative.   There were no vitals taken for this visit.There is no height or weight on file to calculate BMI.  General Appearance: Casual  Eye Contact:  Fair  Speech:  Clear and Coherent  Volume:  Normal  Mood:  Irritable  Affect:  Appropriate  Thought Process:  Goal Directed and Descriptions of Associations: Intact  Orientation:  Full (Time, Place, and Person)  Thought Content: Logical   Suicidal Thoughts:  No  Homicidal Thoughts:  No  Memory:  Immediate;   Fair Recent;   Fair Remote;   Fair  Judgement:  Fair  Insight:  Fair  Psychomotor Activity:  Normal  Concentration:  Concentration: Fair and Attention Span: Fair  Recall:  [employer] of Knowledge: Fair  Language: Fair  Akathisia:  No  Handed:  Right  AIMS (if indicated): Denies tremors, rigidity  Assets:  Communication Skills Desire for Improvement Social Support  ADL's:  Intact  Cognition: WNL  Sleep:  Fair   Screenings: PHQ2-9     Office Visit from 10/11/2016 in [facility] Office Visit from 06/26/2016 in [facility] Office Visit from 03/13/2016 in [facility] Office Visit from 11/14/2015 in [facility] Office Visit from 08/18/2015 in [facility]  PHQ-2 Total Score  2  1  1  2  1   PHQ-9 Total Score  8  --  --  8  --       Assessment and Plan: Velen is a 53 year old Caucasian female who has a history of MDD, insomnia, chronic pain was evaluated by  telemedicine today.  She is currently struggling with irritability and anxiety.she does have psychosocial stressors of the pandemic and relationship.  Patient has been noncompliant with psychotherapy sessions.  Plan as noted below.  Plan MDD-unstable Continue Cymbalta 60 mg p.o. twice daily Abilify 5 mg p.o. daily Start gabapentin 300 mg p.o. daily.  Insomnia-stable Doxepin 10 to 20 mg p.o. nightly  Chronic pain syndrome-she will continue to work with her primary care provider.  Patient to continue to work with her pastor at this time.  However discussed referral back to her therapist for follow-up therapy appointment.  Patient will let  writer know.  Follow-up in clinic in 2 to 3 weeks or sooner if needed.  January 12 at 8:30 AM  I have spent atleast 15 minutes non face to face with patient today. More than 50 % of the time was spent for psychoeducation and supportive psychotherapy and care coordination. This note was generated in part or whole with voice recognition software. Voice recognition is usually quite accurate but there are transcription errors that can and very often do occur. I apologize for any typographical errors that were not detected and corrected.       Ursula Alert, MD 12/21/2019, 5:30 PM

## 2019-12-22 ENCOUNTER — Ambulatory Visit: Payer: PRIVATE HEALTH INSURANCE | Admitting: Psychiatry

## 2020-01-10 ENCOUNTER — Telehealth: Payer: Self-pay

## 2020-01-10 DIAGNOSIS — F411 Generalized anxiety disorder: Secondary | ICD-10-CM

## 2020-01-10 DIAGNOSIS — F331 Major depressive disorder, recurrent, moderate: Secondary | ICD-10-CM

## 2020-01-10 MED ORDER — ARIPIPRAZOLE 10 MG PO TABS: 10.0000 mg | ORAL_TABLET | Freq: Every day | ORAL | 0 refills | Status: DC

## 2020-01-10 NOTE — Telephone Encounter (Signed)
Returned call to patient.  She reports she is having mood lability, crying spells.  Discussed increasing Abilify to 10 mg p.o. daily All her other medications will stay the same.  Advised patient to schedule an appointment with her therapist as soon as possible.  Will reschedule her appointment from tomorrow to January 22.  I will pass the message to our front desk-Lea

## 2020-01-10 NOTE — Telephone Encounter (Signed)
pt stated that the 1st week seemed ok but now she having pain in her legs and short tempered and crying.

## 2020-01-11 ENCOUNTER — Ambulatory Visit: Payer: PRIVATE HEALTH INSURANCE | Admitting: Psychiatry

## 2020-01-21 ENCOUNTER — Ambulatory Visit (INDEPENDENT_AMBULATORY_CARE_PROVIDER_SITE_OTHER): Payer: PRIVATE HEALTH INSURANCE | Admitting: Psychiatry

## 2020-01-21 ENCOUNTER — Other Ambulatory Visit: Payer: Self-pay

## 2020-01-21 ENCOUNTER — Encounter: Payer: Self-pay | Admitting: Psychiatry

## 2020-01-21 DIAGNOSIS — F5105 Insomnia due to other mental disorder: Secondary | ICD-10-CM

## 2020-01-21 DIAGNOSIS — F411 Generalized anxiety disorder: Secondary | ICD-10-CM

## 2020-01-21 DIAGNOSIS — F331 Major depressive disorder, recurrent, moderate: Secondary | ICD-10-CM | POA: Diagnosis not present

## 2020-01-21 MED ORDER — ARIPIPRAZOLE 10 MG PO TABS: 10.0000 mg | ORAL_TABLET | Freq: Every day | ORAL | 0 refills | Status: DC

## 2020-01-21 NOTE — Progress Notes (Signed)
Virtual Visit via Video Note  I connected with Jamie [name] on 01/21/20 at  8:30 AM EST by a video enabled telemedicine application and verified that I am speaking with the correct person using two identifiers.   I discussed the limitations of evaluation and management by telemedicine and the availability of in person appointments. The patient expressed understanding and agreed to proceed.    I discussed the assessment and treatment plan with the patient. The patient was provided an opportunity to ask questions and all were answered. The patient agreed with the plan and demonstrated an understanding of the instructions.   The patient was advised to call back or seek an in-person evaluation if the symptoms worsen or if the condition fails to improve as anticipated.   [facility] MD OP Progress Note  01/21/2020 12:28 PM Jamie LILIENTHAL  MRN:  [id number]  Chief Complaint:  Chief Complaint    Follow-up     HPI: Jamie [name] is a 54 year old Caucasian female, married, lives in [place], employed, has a history of MDD, insomnia, chronic pain was evaluated by telemedicine today.  Patient today reports she is currently making progress on the current medication regimen.  She has noticed improvement with the Abilify.  She reports she is not as irritable or anxious as she used to be few weeks ago.  She is tolerating the Abilify well.  Denies side effects.  Patient reports sleep is good.  She continues to work third shift and hence sleeps during the day.  She has to continue to do this for a while since her children are at home doing virtual schooling.  Patient denies any suicidality, homicidality or perceptual disturbances.  She has upcoming appointment with therapist.  She is motivated to keep it.  Patient denies any other concerns today. Visit Diagnosis:    ICD-10-CM   1. MDD (major depressive disorder), recurrent episode, moderate (HCC)  F33.1 ARIPiprazole (ABILIFY) 10 MG tablet  2. GAD  (generalized anxiety disorder)  F41.1   3. Insomnia due to mental condition  F51.05     Past Psychiatric History: I have reviewed past psychiatric history from my progress note on 03/08/2019.  Past Medical History:  Past Medical History:  Diagnosis Date  . Anxiety   . Brachial neuritis   . Chronic neck pain   . Chronic pain not due to malignancy   . Depressive disorder    followed by Dr. Alethia Berthold.  . Disturbance, sleep   . Elevated blood pressure reading without diagnosis of hypertension   . Embolism and thrombosis of splenic artery   . H/O thrombophlebitis   . Infarction of spleen   . Lumbosacral neuritis   . Obesity, Class II, BMI 35-39.9, with comorbidity     Past Surgical History:  Procedure Laterality Date  . CESAREAN SECTION  09/24/2010  . TONSILLECTOMY AND ADENOIDECTOMY  09/24/2010  . TUBAL LIGATION  09/24/2010    Family Psychiatric History: I have reviewed family psychiatric history from my progress note on 03/08/2019.  Family History:  Family History  Problem Relation Age of Onset  . Anxiety disorder Mother   . Depression Mother   . Alcohol abuse Brother     Social History: Reviewed social history from my progress note on 03/08/2019. Social History   Socioeconomic History  . Marital status: Married    Spouse name: rodney  . Number of children: 3  . Years of education: Not on file  . Highest education level: Some college, no degree  Occupational  History    Comment: full time  Tobacco Use  . Smoking status: Never Smoker  . Smokeless tobacco: Never Used  Substance and Sexual Activity  . Alcohol use: Yes    Alcohol/week: 0.0 standard drinks    Comment: social  . Drug use: Yes    Types: Marijuana    Comment: 6 mths ago  . Sexual activity: Yes    Partners: Male    Birth control/protection: Surgical    Comment: Tubal ligation   Other Topics Concern  . Not on file  Social History Narrative  . Not on file   Social Determinants of Health    Financial Resource Strain:   . Difficulty of Paying Living Expenses: Not on file  Food Insecurity:   . Worried About Charity fundraiser in the Last Year: Not on file  . Ran Out of Food in the Last Year: Not on file  Transportation Needs:   . Lack of Transportation (Medical): Not on file  . Lack of Transportation (Non-Medical): Not on file  Physical Activity:   . Days of Exercise per Week: Not on file  . Minutes of Exercise per Session: Not on file  Stress:   . Feeling of Stress : Not on file  Social Connections:   . Frequency of Communication with Friends and Family: Not on file  . Frequency of Social Gatherings with Friends and Family: Not on file  . Attends Religious Services: Not on file  . Active Member of Clubs or Organizations: Not on file  . Attends Archivist Meetings: Not on file  . Marital Status: Not on file    Allergies:  Allergies  Allergen Reactions  . Neuromuscular Blocking Agents   . Hydrocodone-Acetaminophen Nausea Only    Metabolic Disorder Labs: No results found for: HGBA1C, MPG No results found for: PROLACTIN No results found for: CHOL, TRIG, HDL, CHOLHDL, VLDL, LDLCALC No results found for: TSH  Therapeutic Level Labs: No results found for: LITHIUM No results found for: VALPROATE No components found for:  CBMZ  Current Medications: Current Outpatient Medications  Medication Sig Dispense Refill  . ARIPiprazole (ABILIFY) 10 MG tablet Take 1 tablet (10 mg total) by mouth daily. 90 tablet 0  . doxepin (SINEQUAN) 10 MG capsule Take 1-2 capsules (10-20 mg total) by mouth at bedtime. 180 capsule 0  . DULoxetine (CYMBALTA) 60 MG capsule Take 1 capsule by mouth twice daily 180 capsule 0  . estradiol (ESTRACE VAGINAL) 0.1 MG/GM vaginal cream Place 1 g vaginally 3 (three) times a week. (Patient not taking: Reported on 04/01/2019) 42.5 g 3  . gabapentin (NEURONTIN) 300 MG capsule Take 1 capsule (300 mg total) by mouth daily. 30 capsule 1  .  ibuprofen (ADVIL,MOTRIN) 800 MG tablet Take 800 mg by mouth every 8 (eight) hours as needed.     No current facility-administered medications for this visit.     Musculoskeletal: Strength & Muscle Tone: UTA Gait & Station: normal Patient leans: N/A  Psychiatric Specialty Exam: Review of Systems  Psychiatric/Behavioral: The patient is nervous/anxious.   All other systems reviewed and are negative.   There were no vitals taken for this visit.There is no height or weight on file to calculate BMI.  General Appearance: Casual  Eye Contact:  Good  Speech:  Clear and Coherent  Volume:  Normal  Mood:  Anxious  Affect:  Congruent  Thought Process:  Goal Directed and Descriptions of Associations: Intact  Orientation:  Full (Time, Place, and Person)  Thought Content: Logical   Suicidal Thoughts:  No  Homicidal Thoughts:  No  Memory:  Immediate;   Fair Recent;   Fair Remote;   Fair  Judgement:  Fair  Insight:  Fair  Psychomotor Activity:  Normal  Concentration:  Concentration: Fair and Attention Span: Fair  Recall:  [employer] of Knowledge: Fair  Language: Fair  Akathisia:  No  Handed:  Right  AIMS (if indicated): Denies tremors, rigidity  Assets:  Communication Skills Desire for Improvement Social Support  ADL's:  Intact  Cognition: WNL  Sleep:  Fair   Screenings: PHQ2-9     Office Visit from 10/11/2016 in [facility] Office Visit from 06/26/2016 in [facility] Office Visit from 03/13/2016 in [facility] Office Visit from 11/14/2015 in [facility] Office Visit from 08/18/2015 in [facility]  PHQ-2 Total Score  2  1  1  2  1   PHQ-9 Total Score  8  --  --  8  --       Assessment and Plan: Shelea is a 54 year old Caucasian female who has a history of MDD, insomnia, chronic pain was evaluated by telemedicine today.  She is currently making progress on the current medication  regimen and reports improvement in her irritability and anxiety.  She will benefit from medication management and psychotherapy sessions.  Plan as noted below.  Plan MDD-improving Cymbalta 60 mg p.o. twice daily Continue Abilify 10 mg p.o. daily Gabapentin 300 mg p.o. daily  Insomnia-stable Doxepin 10 to 20 mg p.o. nightly   Chronic pain syndrome-she will continue to work with her primary care provider  Patient has established care with therapist-encouraged her to participate in therapy sessions.  Follow-up in clinic in 3 weeks or sooner if needed.  February 12 at 9 AM  I have spent atleast 20 minutes non face to face with patient today. More than 50 % of the time was spent for  ordering medications and test ,psychoeducation and supportive psychotherapy and care coordination,as well as documenting clinical information in electronic health record. This note was generated in part or whole with voice recognition software. Voice recognition is usually quite accurate but there are transcription errors that can and very often do occur. I apologize for any typographical errors that were not detected and corrected.      Ursula Alert, MD 01/21/2020, 12:28 PM

## 2020-01-26 ENCOUNTER — Encounter (HOSPITAL_COMMUNITY): Payer: Self-pay | Admitting: Psychology

## 2020-01-26 ENCOUNTER — Other Ambulatory Visit: Payer: Self-pay

## 2020-01-26 ENCOUNTER — Ambulatory Visit (HOSPITAL_COMMUNITY): Payer: PRIVATE HEALTH INSURANCE | Admitting: Psychology

## 2020-01-26 NOTE — Progress Notes (Signed)
Jamie [name] is a 54 y.o. female patient who didn't show for her virtual appointment today.  Counselor attempted to call 2x and left a message.  Informed to call to office to reschedule if would like counseling.        Jan Fireman, [facility]

## 2020-02-07 DIAGNOSIS — M62838 Other muscle spasm: Secondary | ICD-10-CM | POA: Diagnosis not present

## 2020-02-07 DIAGNOSIS — M5441 Lumbago with sciatica, right side: Secondary | ICD-10-CM | POA: Diagnosis not present

## 2020-02-11 ENCOUNTER — Other Ambulatory Visit: Payer: Self-pay

## 2020-02-11 ENCOUNTER — Encounter: Payer: Self-pay | Admitting: Psychiatry

## 2020-02-11 ENCOUNTER — Ambulatory Visit (INDEPENDENT_AMBULATORY_CARE_PROVIDER_SITE_OTHER): Payer: PRIVATE HEALTH INSURANCE | Admitting: Psychiatry

## 2020-02-11 DIAGNOSIS — F411 Generalized anxiety disorder: Secondary | ICD-10-CM | POA: Diagnosis not present

## 2020-02-11 DIAGNOSIS — F5105 Insomnia due to other mental disorder: Secondary | ICD-10-CM | POA: Diagnosis not present

## 2020-02-11 DIAGNOSIS — G8929 Other chronic pain: Secondary | ICD-10-CM | POA: Diagnosis not present

## 2020-02-11 DIAGNOSIS — M62838 Other muscle spasm: Secondary | ICD-10-CM | POA: Diagnosis not present

## 2020-02-11 DIAGNOSIS — M47816 Spondylosis without myelopathy or radiculopathy, lumbar region: Secondary | ICD-10-CM | POA: Diagnosis not present

## 2020-02-11 DIAGNOSIS — F331 Major depressive disorder, recurrent, moderate: Secondary | ICD-10-CM

## 2020-02-11 DIAGNOSIS — M5136 Other intervertebral disc degeneration, lumbar region: Secondary | ICD-10-CM | POA: Diagnosis not present

## 2020-02-11 DIAGNOSIS — M5441 Lumbago with sciatica, right side: Secondary | ICD-10-CM | POA: Diagnosis not present

## 2020-02-11 MED ORDER — GABAPENTIN 300 MG PO CAPS: 300.0000 mg | ORAL_CAPSULE | Freq: Two times a day (BID) | ORAL | 1 refills | Status: DC

## 2020-02-11 NOTE — Progress Notes (Signed)
Provider Location : [facility] Patient Location : Home  Virtual Visit via Video Note  I connected with Jamie [name] on 02/11/20 at  9:00 AM EST by a video enabled telemedicine application and verified that I am speaking with the correct person using two identifiers.   I discussed the limitations of evaluation and management by telemedicine and the availability of in person appointments. The patient expressed understanding and agreed to proceed.   I discussed the assessment and treatment plan with the patient. The patient was provided an opportunity to ask questions and all were answered. The patient agreed with the plan and demonstrated an understanding of the instructions.   The patient was advised to call back or seek an in-person evaluation if the symptoms worsen or if the condition fails to improve as anticipated.   [place] MD OP Progress Note  02/11/2020 9:25 AM Jamie [name]  MRN:  [id number]  Chief Complaint:  Chief Complaint    Follow-up     HPI: Jamie [name] is a 54 year old Caucasian female, married, lives in [place], employed, has a history of MDD, insomnia, chronic pain was evaluated by telemedicine today.  Patient today reports she is currently struggling with a pinched nerve and is in pain.  She is currently on prednisone as well as tramadol and a muscle relaxant.  She has upcoming appointment with her providers for the same.  She reports she continues to feel irritable and angry often.  She does not know what is triggering it.  She reports she is working on her relationship with her husband and that is bringing a lot of memories from her past.  That could be causing her anger issues.  She is not really sure.  She reports she missed the appointment with therapist since she slept through her alarm when it went off.  She reports she will contact them to schedule another appointment.  Patient reports she is compliant on her medications as prescribed.  Patient denies any suicidality,  homicidality or perceptual disturbances.  Patient denies any other concerns today. Visit Diagnosis:    ICD-10-CM   1. MDD (major depressive disorder), recurrent episode, moderate (HCC)  F33.1 gabapentin (NEURONTIN) 300 MG capsule  2. GAD (generalized anxiety disorder)  F41.1   3. Insomnia due to mental condition  F51.05     Past Psychiatric History: I have reviewed past psychiatric history from my progress note on 03/08/2019.  Past Medical History:  Past Medical History:  Diagnosis Date  . Anxiety   . Brachial neuritis   . Chronic neck pain   . Chronic pain not due to malignancy   . Depressive disorder    followed by Dr. Alethia Berthold.  . Disturbance, sleep   . Elevated blood pressure reading without diagnosis of hypertension   . Embolism and thrombosis of splenic artery   . H/O thrombophlebitis   . Infarction of spleen   . Lumbosacral neuritis   . Obesity, Class II, BMI 35-39.9, with comorbidity     Past Surgical History:  Procedure Laterality Date  . CESAREAN SECTION  09/24/2010  . TONSILLECTOMY AND ADENOIDECTOMY  09/24/2010  . TUBAL LIGATION  09/24/2010    Family Psychiatric History: I have reviewed family psychiatric history from my progress note on 03/08/2019.  Family History:  Family History  Problem Relation Age of Onset  . Anxiety disorder Mother   . Depression Mother   . Alcohol abuse Brother     Social History: Reviewed social history from my progress note on  03/08/2019. Social History   Socioeconomic History  . Marital status: Married    Spouse name: rodney  . Number of children: 3  . Years of education: Not on file  . Highest education level: Some college, no degree  Occupational History    Comment: full time  Tobacco Use  . Smoking status: Never Smoker  . Smokeless tobacco: Never Used  Substance and Sexual Activity  . Alcohol use: Yes    Alcohol/week: 0.0 standard drinks    Comment: social  . Drug use: Yes    Types: Marijuana    Comment: 6 mths  ago  . Sexual activity: Yes    Partners: Male    Birth control/protection: Surgical    Comment: Tubal ligation   Other Topics Concern  . Not on file  Social History Narrative  . Not on file   Social Determinants of Health   Financial Resource Strain:   . Difficulty of Paying Living Expenses: Not on file  Food Insecurity:   . Worried About Charity fundraiser in the Last Year: Not on file  . Ran Out of Food in the Last Year: Not on file  Transportation Needs:   . Lack of Transportation (Medical): Not on file  . Lack of Transportation (Non-Medical): Not on file  Physical Activity:   . Days of Exercise per Week: Not on file  . Minutes of Exercise per Session: Not on file  Stress:   . Feeling of Stress : Not on file  Social Connections:   . Frequency of Communication with Friends and Family: Not on file  . Frequency of Social Gatherings with Friends and Family: Not on file  . Attends Religious Services: Not on file  . Active Member of Clubs or Organizations: Not on file  . Attends Archivist Meetings: Not on file  . Marital Status: Not on file    Allergies:  Allergies  Allergen Reactions  . Neuromuscular Blocking Agents   . Hydrocodone-Acetaminophen Nausea Only    Metabolic Disorder Labs: No results found for: HGBA1C, MPG No results found for: PROLACTIN No results found for: CHOL, TRIG, HDL, CHOLHDL, VLDL, LDLCALC No results found for: TSH  Therapeutic Level Labs: No results found for: LITHIUM No results found for: VALPROATE No components found for:  CBMZ  Current Medications: Current Outpatient Medications  Medication Sig Dispense Refill  . ARIPiprazole (ABILIFY) 10 MG tablet Take 1 tablet (10 mg total) by mouth daily. 90 tablet 0  . cyclobenzaprine (FLEXERIL) 10 MG tablet Take 10 mg by mouth 3 (three) times daily.    Marland Kitchen doxepin (SINEQUAN) 10 MG capsule Take 1-2 capsules (10-20 mg total) by mouth at bedtime. 180 capsule 0  . DULoxetine (CYMBALTA) 60 MG  capsule Take 1 capsule by mouth twice daily 180 capsule 0  . estradiol (ESTRACE VAGINAL) 0.1 MG/GM vaginal cream Place 1 g vaginally 3 (three) times a week. (Patient not taking: Reported on 04/01/2019) 42.5 g 3  . gabapentin (NEURONTIN) 300 MG capsule Take 1 capsule (300 mg total) by mouth 2 (two) times daily. 60 capsule 1  . ibuprofen (ADVIL,MOTRIN) 800 MG tablet Take 800 mg by mouth every 8 (eight) hours as needed.    . methylPREDNISolone (MEDROL DOSEPAK) 4 MG TBPK tablet See admin instructions.    . traMADol (ULTRAM) 50 MG tablet Take 50 mg by mouth every 8 (eight) hours as needed.     No current facility-administered medications for this visit.     Musculoskeletal: Strength &  Muscle Tone: UTA Gait & Station: normal Patient leans: N/A  Psychiatric Specialty Exam: Review of Systems  Musculoskeletal: Positive for back pain.  Psychiatric/Behavioral:       Anger and irritability  All other systems reviewed and are negative.   There were no vitals taken for this visit.There is no height or weight on file to calculate BMI.  General Appearance: Casual  Eye Contact:  Fair  Speech:  Clear and Coherent  Volume:  Normal  Mood:  Irritable  Affect:  Congruent  Thought Process:  Goal Directed and Descriptions of Associations: Intact  Orientation:  Full (Time, Place, and Person)  Thought Content: Logical   Suicidal Thoughts:  No  Homicidal Thoughts:  No  Memory:  Immediate;   Fair Recent;   Fair Remote;   Fair  Judgement:  Fair  Insight:  Fair  Psychomotor Activity:  Normal  Concentration:  Concentration: Fair and Attention Span: Fair  Recall:  [employer] of Knowledge: Fair  Language: Fair  Akathisia:  No  Handed:  Right  AIMS (if indicated): Denies tremors, rigidity  Assets:  Communication Skills Desire for Improvement Housing Intimacy Social Support  ADL's:  Intact  Cognition: WNL  Sleep:  Fair   Screenings: PHQ2-9     Office Visit from 10/11/2016 in [facility] Office Visit from 06/26/2016 in [facility] Office Visit from 03/13/2016 in [facility] Office Visit from 11/14/2015 in [facility] Office Visit from 08/18/2015 in [facility]  PHQ-2 Total Score  2  1  1  2  1   PHQ-9 Total Score  8  --  --  8  --       Assessment and Plan: Jamie [name] is a 54 year old Caucasian female who has a history of MDD, insomnia, chronic pain was evaluated by telemedicine today.  She is currently struggling with mood lability and pain and will benefit from medication readjustment.  Patient also will continue to benefit from psychotherapy sessions.  Plan as noted below.  Plan MDD-some progress Cymbalta 60 mg p.o. twice daily Abilify 10 mg p.o. daily Increase gabapentin to 300 mg p.o. twice daily  Insomnia-stable Doxepin 10 to 20 mg p.o. nightly. Patient however works third shift and sleeps during the day and has to be up several times to help her children who are in virtual school which could be having an impact on her sleep.  Chronic pain syndrome-she will continue to work with her providers.  Patient advised to reach out to therapist since she had missed her last appointment.  Follow-up in clinic in 3 weeks or sooner if needed.  March 3 at 9 AM  I have spent atleast 20 minutes non face to face with patient today. More than 50 % of the time was spent for  ordering medications and test ,psychoeducation and supportive psychotherapy and care coordination,as well as documenting clinical information in electronic health record. This note was generated in part or whole with voice recognition software. Voice recognition is usually quite accurate but there are transcription errors that can and very often do occur. I apologize for any typographical errors that were not detected and corrected.       Ursula Alert, MD 02/11/2020, 9:25 AM

## 2020-02-14 ENCOUNTER — Other Ambulatory Visit: Payer: Self-pay | Admitting: Sports Medicine

## 2020-02-14 DIAGNOSIS — M7071 Other bursitis of hip, right hip: Secondary | ICD-10-CM

## 2020-02-14 DIAGNOSIS — M5136 Other intervertebral disc degeneration, lumbar region: Secondary | ICD-10-CM

## 2020-02-14 DIAGNOSIS — M62838 Other muscle spasm: Secondary | ICD-10-CM

## 2020-02-14 DIAGNOSIS — G8929 Other chronic pain: Secondary | ICD-10-CM

## 2020-02-14 DIAGNOSIS — M5441 Lumbago with sciatica, right side: Secondary | ICD-10-CM

## 2020-02-16 DIAGNOSIS — Z20828 Contact with and (suspected) exposure to other viral communicable diseases: Secondary | ICD-10-CM | POA: Diagnosis not present

## 2020-03-01 ENCOUNTER — Other Ambulatory Visit: Payer: Self-pay

## 2020-03-01 ENCOUNTER — Encounter: Payer: Self-pay | Admitting: Psychiatry

## 2020-03-01 ENCOUNTER — Ambulatory Visit (INDEPENDENT_AMBULATORY_CARE_PROVIDER_SITE_OTHER): Payer: PRIVATE HEALTH INSURANCE | Admitting: Psychiatry

## 2020-03-01 DIAGNOSIS — F331 Major depressive disorder, recurrent, moderate: Secondary | ICD-10-CM

## 2020-03-01 DIAGNOSIS — F411 Generalized anxiety disorder: Secondary | ICD-10-CM | POA: Diagnosis not present

## 2020-03-01 DIAGNOSIS — F5105 Insomnia due to other mental disorder: Secondary | ICD-10-CM

## 2020-03-01 DIAGNOSIS — Z20828 Contact with and (suspected) exposure to other viral communicable diseases: Secondary | ICD-10-CM | POA: Diagnosis not present

## 2020-03-01 MED ORDER — DULOXETINE HCL 30 MG PO CPEP: 30.0000 mg | ORAL_CAPSULE | Freq: Every day | ORAL | 0 refills | Status: DC

## 2020-03-01 MED ORDER — FLUOXETINE HCL 20 MG PO CAPS: 20.0000 mg | ORAL_CAPSULE | Freq: Every day | ORAL | 0 refills | Status: DC

## 2020-03-01 NOTE — Progress Notes (Signed)
Provider Location : [facility] Patient Location : Home This note is not being shared with the patient for the following reason: To prevent harm (release of this note would result in harm to the life or physical safety of the patient or another).   Virtual Visit via Video Note  I connected with Jamie [name] on 03/01/20 at  9:00 AM EST by a video enabled telemedicine application and verified that I am speaking with the correct person using two identifiers.   I discussed the limitations of evaluation and management by telemedicine and the availability of in person appointments. The patient expressed understanding and agreed to proceed.   I discussed the assessment and treatment plan with the patient. The patient was provided an opportunity to ask questions and all were answered. The patient agreed with the plan and demonstrated an understanding of the instructions.   The patient was advised to call back or seek an in-person evaluation if the symptoms worsen or if the condition fails to improve as anticipated.  [place] MD OP Progress Note  03/01/2020 12:34 PM Jamie [name]  MRN:  [id number]  Chief Complaint:  Chief Complaint    Follow-up     HPI: [name] is a 54 year old Caucasian female, married, lives in [place], employed, has a history of MDD, insomnia, chronic pain was evaluated by telemedicine today.  Patient today reports she is currently feeling overwhelmed, depressed.  Patient became tearful in the session.  She reports she is trying hard to cope with her work schedule and also taking care of her children who is currently doing virtual schooling at home.  She works overnight and tries to sleep during the day however she has to be up to help her children during the day.  Her husband tries to help out during the day.  She however reports in spite of that she feels overwhelmed and sad.  She reports she had passive suicidal thoughts yesterday when she felt what was the whole point of being  here.  She however reports she did not have a plan.  She reports she is religious and will not do anything to hurt herself.  She has children at home and she reports she will never do anything to hurt them.  Patient reports she has not been able to start psychotherapy sessions since she is trying to find a therapist with [facility] where it is cheaper for her.  They will provide her care based on her income.  She agrees to make an appointment with them as soon as possible.  Patient currently denies any suicidal thoughts, homicidality or perceptual disturbances.  Patient denies any other concerns today. Visit Diagnosis:    ICD-10-CM   1. MDD (major depressive disorder), recurrent episode, moderate (HCC)  F33.1 FLUoxetine (PROZAC) 20 MG capsule    DULoxetine (CYMBALTA) 30 MG capsule  2. GAD (generalized anxiety disorder)  F41.1 FLUoxetine (PROZAC) 20 MG capsule    DULoxetine (CYMBALTA) 30 MG capsule  3. Insomnia due to mental condition  F51.05     Past Psychiatric History: I have reviewed past psychiatric history from my progress note on 03/08/2019 Past Medical History:  Past Medical History:  Diagnosis Date  . Anxiety   . Brachial neuritis   . Chronic neck pain   . Chronic pain not due to malignancy   . Depressive disorder    followed by Dr. Alethia Berthold.  . Disturbance, sleep   . Elevated blood pressure reading without diagnosis of hypertension   .  Embolism and thrombosis of splenic artery   . H/O thrombophlebitis   . Infarction of spleen   . Lumbosacral neuritis   . Obesity, Class II, BMI 35-39.9, with comorbidity     Past Surgical History:  Procedure Laterality Date  . CESAREAN SECTION  09/24/2010  . TONSILLECTOMY AND ADENOIDECTOMY  09/24/2010  . TUBAL LIGATION  09/24/2010    Family Psychiatric History: Reviewed family psychiatric history from my progress note on 03/08/2019  Family History:  Family History  Problem Relation Age of Onset  . Anxiety disorder Mother   . Depression  Mother   . Alcohol abuse Brother     Social History: Reviewed social history from my progress note on 03/08/2019 Social History   Socioeconomic History  . Marital status: Married    Spouse name: rodney  . Number of children: 3  . Years of education: Not on file  . Highest education level: Some college, no degree  Occupational History    Comment: full time  Tobacco Use  . Smoking status: Never Smoker  . Smokeless tobacco: Never Used  Substance and Sexual Activity  . Alcohol use: Yes    Alcohol/week: 0.0 standard drinks    Comment: social  . Drug use: Yes    Types: Marijuana    Comment: 6 mths ago  . Sexual activity: Yes    Partners: Male    Birth control/protection: Surgical    Comment: Tubal ligation   Other Topics Concern  . Not on file  Social History Narrative  . Not on file   Social Determinants of Health   Financial Resource Strain:   . Difficulty of Paying Living Expenses: Not on file  Food Insecurity:   . Worried About Charity fundraiser in the Last Year: Not on file  . Ran Out of Food in the Last Year: Not on file  Transportation Needs:   . Lack of Transportation (Medical): Not on file  . Lack of Transportation (Non-Medical): Not on file  Physical Activity:   . Days of Exercise per Week: Not on file  . Minutes of Exercise per Session: Not on file  Stress:   . Feeling of Stress : Not on file  Social Connections:   . Frequency of Communication with Friends and Family: Not on file  . Frequency of Social Gatherings with Friends and Family: Not on file  . Attends Religious Services: Not on file  . Active Member of Clubs or Organizations: Not on file  . Attends Archivist Meetings: Not on file  . Marital Status: Not on file    Allergies:  Allergies  Allergen Reactions  . Neuromuscular Blocking Agents   . Hydrocodone-Acetaminophen Nausea Only    Metabolic Disorder Labs: No results found for: HGBA1C, MPG No results found for: PROLACTIN No  results found for: CHOL, TRIG, HDL, CHOLHDL, VLDL, LDLCALC No results found for: TSH  Therapeutic Level Labs: No results found for: LITHIUM No results found for: VALPROATE No components found for:  CBMZ  Current Medications: Current Outpatient Medications  Medication Sig Dispense Refill  . meloxicam (MOBIC) 15 MG tablet Take by mouth.    . ARIPiprazole (ABILIFY) 10 MG tablet Take 1 tablet (10 mg total) by mouth daily. 90 tablet 0  . cyclobenzaprine (FLEXERIL) 10 MG tablet Take 10 mg by mouth 3 (three) times daily.    Marland Kitchen doxepin (SINEQUAN) 10 MG capsule Take 1-2 capsules (10-20 mg total) by mouth at bedtime. 180 capsule 0  . DULoxetine (CYMBALTA)  30 MG capsule Take 1 capsule (30 mg total) by mouth daily. 7 capsule 0  . estradiol (ESTRACE VAGINAL) 0.1 MG/GM vaginal cream Place 1 g vaginally 3 (three) times a week. (Patient not taking: Reported on 04/01/2019) 42.5 g 3  . FLUoxetine (PROZAC) 20 MG capsule Take 1 capsule (20 mg total) by mouth daily with breakfast. 30 capsule 0  . gabapentin (NEURONTIN) 300 MG capsule Take 1 capsule (300 mg total) by mouth 2 (two) times daily. 60 capsule 1  . ibuprofen (ADVIL,MOTRIN) 800 MG tablet Take 800 mg by mouth every 8 (eight) hours as needed.    . meloxicam (MOBIC) 15 MG tablet Take 15 mg by mouth daily.    . methylPREDNISolone (MEDROL DOSEPAK) 4 MG TBPK tablet See admin instructions.    . traMADol (ULTRAM) 50 MG tablet Take 50 mg by mouth every 8 (eight) hours as needed.     No current facility-administered medications for this visit.     Musculoskeletal: Strength & Muscle Tone: UTA Gait & Station: normal Patient leans: N/A  Psychiatric Specialty Exam: Review of Systems  Psychiatric/Behavioral: Positive for dysphoric mood and sleep disturbance.  All other systems reviewed and are negative.   There were no vitals taken for this visit.There is no height or weight on file to calculate BMI.  General Appearance: Casual  Eye Contact:  Fair   Speech:  Clear and Coherent  Volume:  Normal  Mood:  Depressed  Affect:  Tearful  Thought Process:  Goal Directed and Descriptions of Associations: Intact  Orientation:  Full (Time, Place, and Person)  Thought Content: Logical   Suicidal Thoughts:  No had recent SI, Passive  Homicidal Thoughts:  No  Memory:  Immediate;   Fair Recent;   Fair Remote;   Fair  Judgement:  Fair  Insight:  Fair  Psychomotor Activity:  Normal  Concentration:  Concentration: Fair and Attention Span: Fair  Recall:  [employer] of Knowledge: Fair  Language: Fair  Akathisia:  No  Handed:  Right  AIMS (if indicated):UTA  Assets:  Communication Skills Desire for Improvement Housing Social Support  ADL's:  Intact  Cognition: WNL  Sleep:  Restless   Screenings: PHQ2-9     Office Visit from 10/11/2016 in [facility] Office Visit from 06/26/2016 in [facility] Office Visit from 03/13/2016 in [facility] Office Visit from 11/14/2015 in [facility] Office Visit from 08/18/2015 in [facility]  PHQ-2 Total Score  2  1  1  2  1   PHQ-9 Total Score  8  --  --  8  --       Assessment and Plan: Kimbrely is a 54 year old Caucasian female who has a history of MDD, insomnia, chronic pain was evaluated by telemedicine today.  Patient is currently struggling with depressive symptoms and will benefit from medication readjustment as well as psychotherapy sessions.  She has been noncompliant with psychotherapy recommendation however agrees to make an appointment soon.  Plan as noted below. Risk factors for suicide-recent suicidality, uncontrolled depression, white Protective factors that she is religious, has young children she is responsible for, currently denies any active thoughts or plan, denies any past history of suicide attempts, denies family history of suicide, agrees to go to the nearest hospital or call 911 or talk to her  husband if her suicidal symptoms returns or if she has worsening depression.  Plan MDD-unstable Taper off Cymbalta.  We will reduce it  to 30 mg p.o. daily and taper it off in 7 days. Start Prozac 20 mg p.o. daily with a meal. Abilify 10 mg p.o. daily Gabapentin 300 mg p.o. twice daily  Insomnia-restless Patient has to be up during the day to help her children with virtual school and she works at night. Discussed sleep hygiene. Doxepin 10 to 20 mg p.o. nightly  Patient advised to make an appointment with therapist.  Attempted to contact her husband-[name]-left VM.  Follow-up in clinic in 2  weeks or sooner if needed.  I have spent atleast 30 minutes non face to face with patient today. More than 50 % of the time was spent for preparing to see the patient ( e.g., review of test, records ), obtaining and to review and separately obtained history , ordering medications and test ,psychoeducation and supportive psychotherapy and care coordination,as well as documenting clinical information in electronic health record. This note was generated in part or whole with voice recognition software. Voice recognition is usually quite accurate but there are transcription errors that can and very often do occur. I apologize for any typographical errors that were not detected and corrected.      Ursula Alert, MD 03/01/2020, 12:34 PM

## 2020-03-02 ENCOUNTER — Ambulatory Visit (INDEPENDENT_AMBULATORY_CARE_PROVIDER_SITE_OTHER): Payer: PRIVATE HEALTH INSURANCE | Admitting: Licensed Clinical Social Worker

## 2020-03-02 ENCOUNTER — Encounter: Payer: Self-pay | Admitting: Licensed Clinical Social Worker

## 2020-03-02 DIAGNOSIS — F331 Major depressive disorder, recurrent, moderate: Secondary | ICD-10-CM | POA: Diagnosis not present
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
## 2020-03-07 ENCOUNTER — Ambulatory Visit
Admission: RE | Admit: 2020-03-07 | Discharge: 2020-03-07 | Disposition: A | Discharge status: Home / Self Care | Payer: BC Managed Care – PPO | Source: Ambulatory Visit | Attending: Sports Medicine | Admitting: Sports Medicine

## 2020-03-07 ENCOUNTER — Other Ambulatory Visit: Payer: Self-pay

## 2020-03-07 DIAGNOSIS — M62838 Other muscle spasm: Secondary | ICD-10-CM | POA: Insufficient documentation

## 2020-03-07 DIAGNOSIS — M5136 Other intervertebral disc degeneration, lumbar region: Secondary | ICD-10-CM | POA: Diagnosis not present

## 2020-03-07 DIAGNOSIS — M7071 Other bursitis of hip, right hip: Secondary | ICD-10-CM | POA: Insufficient documentation

## 2020-03-07 DIAGNOSIS — M5441 Lumbago with sciatica, right side: Secondary | ICD-10-CM | POA: Diagnosis not present

## 2020-03-07 DIAGNOSIS — G8929 Other chronic pain: Secondary | ICD-10-CM | POA: Diagnosis not present

## 2020-03-07 DIAGNOSIS — M545 Low back pain: Secondary | ICD-10-CM | POA: Diagnosis not present

## 2020-03-14 ENCOUNTER — Other Ambulatory Visit: Payer: Self-pay

## 2020-03-14 ENCOUNTER — Ambulatory Visit (INDEPENDENT_AMBULATORY_CARE_PROVIDER_SITE_OTHER): Payer: PRIVATE HEALTH INSURANCE | Admitting: Licensed Clinical Social Worker

## 2020-03-14 ENCOUNTER — Encounter: Payer: Self-pay | Admitting: Licensed Clinical Social Worker

## 2020-03-14 DIAGNOSIS — F331 Major depressive disorder, recurrent, moderate: Secondary | ICD-10-CM

## 2020-03-14 NOTE — Progress Notes (Signed)
Virtual Visit via Video Note  I connected with Jamie [name] on 03/14/20 at 10:00 AM EDT by a video enabled telemedicine application and verified that I am speaking with the correct person using two identifiers.   I discussed the limitations of evaluation and management by telemedicine and the availability of in person appointments. The patient expressed understanding and agreed to proceed.  THERAPY PROGRESS NOTE  Session Time: 40 Minutes  Participation Level: Active  Behavioral Response: CasualAlertDepressed  Type of Therapy: Individual Therapy  Treatment Goals addressed: Communication: Assertiveness in Relationships  Interventions: Assertiveness Training  Summary: Jamie [name] is a 54 y.o. female who presents with depression sxs. Pt reported that she has difficulty trusting her spouse after multiple incidents of infidelity. Pt reported that even when she has presented spouse with evidence, he denies the seriousness of the situation and does not take accountability for his actions. Pt reported that she and spouse do not communicate enough, but are in agreement about most things such as finances and raising of the children.   Suicidal/Homicidal: No  Therapist Response: Therapist met with patient for first session since CCA. Therapist and patient reviewed treatment plan and goals. Pt in agreement. Therapist and patient discussed goal of increasing frequency of assertive behaviors to express needs, desires and expectations within the context of her relationship with spouse. Therapist validated patient feelings/concerns and provided support. Therapist encouraged patient to schedule time to spend talking with spouse alone about her needs and expectations and to allow spouse to do the same. Pt in agreement.  Plan: Return again in 3 weeks.  Diagnosis: Axis I: Major Depressive Disorder, Recurrent, Moderate    Axis II: N/A    Josephine Igo, LCSW, LCAS 03/14/2020

## 2020-03-15 ENCOUNTER — Ambulatory Visit (INDEPENDENT_AMBULATORY_CARE_PROVIDER_SITE_OTHER): Payer: Self-pay | Admitting: Psychiatry

## 2020-03-15 ENCOUNTER — Encounter: Payer: Self-pay | Admitting: Psychiatry

## 2020-03-15 DIAGNOSIS — F5105 Insomnia due to other mental disorder: Secondary | ICD-10-CM | POA: Diagnosis not present

## 2020-03-15 DIAGNOSIS — F411 Generalized anxiety disorder: Secondary | ICD-10-CM | POA: Diagnosis not present

## 2020-03-15 DIAGNOSIS — F331 Major depressive disorder, recurrent, moderate: Secondary | ICD-10-CM | POA: Diagnosis not present

## 2020-03-15 MED ORDER — FLUOXETINE HCL 40 MG PO CAPS: 40.0000 mg | ORAL_CAPSULE | Freq: Every day | ORAL | 0 refills | Status: DC

## 2020-03-15 NOTE — Progress Notes (Signed)
Provider Location : [facility] Patient Location : Home  Virtual Visit via Video Note  I connected with Jamie [name] on 03/15/20 at  9:00 AM EDT by a video enabled telemedicine application and verified that I am speaking with the correct person using two identifiers.   I discussed the limitations of evaluation and management by telemedicine and the availability of in person appointments. The patient expressed understanding and agreed to proceed.   I discussed the assessment and treatment plan with the patient. The patient was provided an opportunity to ask questions and all were answered. The patient agreed with the plan and demonstrated an understanding of the instructions.   The patient was advised to call back or seek an in-person evaluation if the symptoms worsen or if the condition fails to improve as anticipated.   [place] MD OP Progress Note  03/15/2020 12:41 PM Jamie [name]  MRN:  [id number]  Chief Complaint:  Chief Complaint    Follow-up     HPI: Ruman is a 54 year old Caucasian female, married, lives in [place], employed, has a history of MDD, insomnia, chronic pain was evaluated by telemedicine today.  Patient today reports she has noticed some improvement in her depressive symptoms since her last visit with Probation officer.  She is currently compliant on medications as prescribed.  She denies side effects.  She reports she likes the effect of Prozac.  She denies side effects to the Prozac at this time.  She reports sleep continues to be restless since she works third shift and tries to sleep during the day.  She however has to take care of her children who needs her help with virtual schooling during the day.  This does interrupt her sleep.  She does have doxepin which she takes as needed which helps to some extent.  Patient denies any suicidality at this time.  She denies any homicidality or perceptual disturbances.  She reports she started psychotherapy sessions and had her second  session yesterday which went well.  She is currently looking for a new job so she can start working on a day schedule.  She has upcoming interview on Friday.  Patient denies any other concerns today. Visit Diagnosis:    ICD-10-CM   1. MDD (major depressive disorder), recurrent episode, moderate (HCC)  F33.1 FLUoxetine (PROZAC) 40 MG capsule  2. GAD (generalized anxiety disorder)  F41.1 FLUoxetine (PROZAC) 40 MG capsule  3. Insomnia due to mental condition  F51.05     Past Psychiatric History: I have reviewed past psychiatric history from my progress note on 03/08/2019  Past Medical History:  Past Medical History:  Diagnosis Date  . Anxiety   . Brachial neuritis   . Chronic neck pain   . Chronic pain not due to malignancy   . Depressive disorder    followed by Dr. Alethia Berthold.  . Disturbance, sleep   . Elevated blood pressure reading without diagnosis of hypertension   . Embolism and thrombosis of splenic artery   . H/O thrombophlebitis   . Infarction of spleen   . Lumbosacral neuritis   . Obesity, Class II, BMI 35-39.9, with comorbidity     Past Surgical History:  Procedure Laterality Date  . CESAREAN SECTION  09/24/2010  . TONSILLECTOMY AND ADENOIDECTOMY  09/24/2010  . TUBAL LIGATION  09/24/2010    Family Psychiatric History: I have reviewed family psychiatric history from my progress note on 03/08/2019  Family History:  Family History  Problem Relation Age of Onset  . Anxiety  disorder Mother   . Depression Mother   . Alcohol abuse Brother     Social History: I have reviewed social history from my progress note on 03/08/2019 Social History   Socioeconomic History  . Marital status: Married    Spouse name: rodney  . Number of children: 3  . Years of education: Not on file  . Highest education level: Some college, no degree  Occupational History    Comment: full time  Tobacco Use  . Smoking status: Never Smoker  . Smokeless tobacco: Never Used  Substance and  Sexual Activity  . Alcohol use: Yes    Alcohol/week: 0.0 standard drinks    Comment: social  . Drug use: Yes    Types: Marijuana    Comment: 6 mths ago  . Sexual activity: Yes    Partners: Male    Birth control/protection: Surgical    Comment: Tubal ligation   Other Topics Concern  . Not on file  Social History Narrative  . Not on file   Social Determinants of Health   Financial Resource Strain:   . Difficulty of Paying Living Expenses:   Food Insecurity:   . Worried About Charity fundraiser in the Last Year:   . Arboriculturist in the Last Year:   Transportation Needs:   . Film/video editor (Medical):   Marland Kitchen Lack of Transportation (Non-Medical):   Physical Activity:   . Days of Exercise per Week:   . Minutes of Exercise per Session:   Stress:   . Feeling of Stress :   Social Connections:   . Frequency of Communication with Friends and Family:   . Frequency of Social Gatherings with Friends and Family:   . Attends Religious Services:   . Active Member of Clubs or Organizations:   . Attends Archivist Meetings:   Marland Kitchen Marital Status:     Allergies:  Allergies  Allergen Reactions  . Neuromuscular Blocking Agents   . Hydrocodone-Acetaminophen Nausea Only    Metabolic Disorder Labs: No results found for: HGBA1C, MPG No results found for: PROLACTIN No results found for: CHOL, TRIG, HDL, CHOLHDL, VLDL, LDLCALC No results found for: TSH  Therapeutic Level Labs: No results found for: LITHIUM No results found for: VALPROATE No components found for:  CBMZ  Current Medications: Current Outpatient Medications  Medication Sig Dispense Refill  . ARIPiprazole (ABILIFY) 10 MG tablet Take 1 tablet (10 mg total) by mouth daily. 90 tablet 0  . cyclobenzaprine (FLEXERIL) 10 MG tablet Take 10 mg by mouth 3 (three) times daily.    . cyclobenzaprine (FLEXERIL) 10 MG tablet Take by mouth.    . doxepin (SINEQUAN) 10 MG capsule Take 1-2 capsules (10-20 mg total) by  mouth at bedtime. 180 capsule 0  . estradiol (ESTRACE VAGINAL) 0.1 MG/GM vaginal cream Place 1 g vaginally 3 (three) times a week. 42.5 g 3  . gabapentin (NEURONTIN) 300 MG capsule Take 1 capsule (300 mg total) by mouth 2 (two) times daily. 60 capsule 1  . methylPREDNISolone (MEDROL DOSEPAK) 4 MG TBPK tablet See admin instructions.    Marland Kitchen FLUoxetine (PROZAC) 40 MG capsule Take 1 capsule (40 mg total) by mouth daily. 30 capsule 0  . ibuprofen (ADVIL,MOTRIN) 800 MG tablet Take 800 mg by mouth every 8 (eight) hours as needed.    . meloxicam (MOBIC) 15 MG tablet Take 15 mg by mouth daily.    . traMADol (ULTRAM) 50 MG tablet Take 50 mg by mouth  every 8 (eight) hours as needed.     No current facility-administered medications for this visit.     Musculoskeletal: Strength & Muscle Tone: UTA Gait & Station: normal Patient leans: N/A  Psychiatric Specialty Exam: Review of Systems  Psychiatric/Behavioral: Positive for dysphoric mood and sleep disturbance.  All other systems reviewed and are negative.   There were no vitals taken for this visit.There is no height or weight on file to calculate BMI.  General Appearance: Casual  Eye Contact:  Fair  Speech:  Clear and Coherent  Volume:  Normal  Mood:  Depressed  Affect:  Congruent  Thought Process:  Goal Directed and Descriptions of Associations: Intact  Orientation:  Full (Time, Place, and Person)  Thought Content: Logical   Suicidal Thoughts:  No  Homicidal Thoughts:  No  Memory:  Immediate;   Fair Recent;   Fair Remote;   Fair  Judgement:  Fair  Insight:  Fair  Psychomotor Activity:  Normal  Concentration:  Concentration: Fair and Attention Span: Fair  Recall:  [employer] of Knowledge: Fair  Language: Fair  Akathisia:  No  Handed:  Right  AIMS (if indicated): UTA  Assets:  Communication Skills Desire for Improvement Housing Social Support  ADL's:  Intact  Cognition: WNL  Sleep:  Restless   Screenings: PHQ2-9     Office  Visit from 03/15/2020 in [facility] Office Visit from 10/11/2016 in [facility] Office Visit from 06/26/2016 in [facility] Office Visit from 03/13/2016 in [facility] Office Visit from 11/14/2015 in [facility]  PHQ-2 Total Score  6  2  1  1  2   PHQ-9 Total Score  17  8  --  --  8       Assessment and Plan: Lenabelle is a 54 year old Caucasian female who has a history of MDD, insomnia, chronic pain was evaluated by telemedicine today.  Patient with psychosocial stressors of the current pandemic, her work schedule, relationship struggles, children who needs assistance during the day with virtual schooling and her inability to rest during the day even though she works at night due to lack of social support.  She is currently making progress with regards to her depressive symptoms however will continue to benefit from medication readjustment and psychotherapy sessions.  Plan MDD-improving Increase Prozac to 40 mg p.o. daily with a meal Abilify 10 mg p.o. daily Gabapentin 300 mg p.o. twice daily  Insomnia-restless Patient continues to not have a good sleep hygiene. Doxepin 10 to 20 mg p.o. nightly.  She currently takes only 1 tablet and advised her to take 2 as needed.  Patient to continue to follow-up with her therapist.  Follow-up in clinic in 2 to 3 weeks or sooner if needed.  I have spent atleast 20 minutes non face to face with patient today. More than 50 % of the time was spent for  ordering medications and test ,psychoeducation and supportive psychotherapy and care coordination,as well as documenting clinical information in electronic health record. This note was generated in part or whole with voice recognition software. Voice recognition is usually quite accurate but there are transcription errors that can and very often do occur. I apologize for any typographical errors that were not  detected and corrected.       Ursula Alert, MD 03/15/2020, 12:41 PM

## 2020-03-21 ENCOUNTER — Encounter: Payer: Self-pay | Admitting: Licensed Clinical Social Worker

## 2020-03-21 ENCOUNTER — Ambulatory Visit (INDEPENDENT_AMBULATORY_CARE_PROVIDER_SITE_OTHER): Payer: Self-pay | Admitting: Licensed Clinical Social Worker

## 2020-03-21 ENCOUNTER — Other Ambulatory Visit: Payer: Self-pay

## 2020-03-21 DIAGNOSIS — F331 Major depressive disorder, recurrent, moderate: Secondary | ICD-10-CM

## 2020-03-21 NOTE — Progress Notes (Signed)
Virtual Visit via Telephone Note  I connected with Jamie [name] on 03/21/20 at 10:30 AM EST by telephone and verified that I am speaking with the correct person using two identifiers.   I discussed the limitations, risks, security and privacy concerns of performing an evaluation and management service by telephone and the availability of in person appointments. I also discussed with the patient that there may be a patient responsible charge related to this service. The patient expressed understanding and agreed to proceed.   THERAPY PROGRESS NOTE  Session Time: 30 Minutes  Participation Level: Active  Behavioral Response: AlertDepressed  Type of Therapy: Individual Therapy  Treatment Goals addressed: Anger, Communication: Relationships and Coping  Interventions: CBT and Assertiveness Training  Summary: Jamie [name] is a 54 y.o. female who presents with depression sxs. Pt reported that she is feeling a bit better in mood due to recent changes to medications, however patient continues to be tearful throughout sessions. Pt was oriented x5 and did not endorse SI. Pt reported that she and spouse planned a time to talk last week, but it fell through due to other obligations. Pt reported they plan on rescheduling their conversation for later this week. Pt reported she wanted to work on her own communication skills to prepare for their conversation and acknowledged that in the past she either shuts down and doesn't talk with spouse or will get angry and say things she later regrets. Pt asked appropriate questions and identified ways she can improve her own communication style by following the tip sheet.   Suicidal/Homicidal: No  Therapist Response: Therapist and patient met for follow up session. Therapist and patient reviewed schedule for future appointments and agreed to meet once weekly instead of every other week due to updated schedule availability. Therapist and patient reviewed homework  assignment from last session regarding scheduling a time to communicate needs for support with spouse. Therapist reviewed communication tip sheet titled Fair Fighting Rules and introduced concept of 5 Love Languages. Pt was receptive.  Plan: Return again in 1 week.  Diagnosis: Axis I: Major Depressive Disorder, Recurrent, Moderate    Axis II: N/A    Josephine Igo, LCSW, LCAS 03/21/2020

## 2020-03-28 ENCOUNTER — Ambulatory Visit (INDEPENDENT_AMBULATORY_CARE_PROVIDER_SITE_OTHER): Payer: Self-pay | Admitting: Licensed Clinical Social Worker

## 2020-03-28 ENCOUNTER — Other Ambulatory Visit: Payer: Self-pay

## 2020-03-28 DIAGNOSIS — F331 Major depressive disorder, recurrent, moderate: Secondary | ICD-10-CM | POA: Diagnosis not present

## 2020-03-28 NOTE — Progress Notes (Signed)
Virtual Visit via Video Note  I connected with Jamie [name] on 03/28/20 at 10:00 AM EDT by a video enabled telemedicine application and verified that I am speaking with the correct person using two identifiers.   I discussed the limitations of evaluation and management by telemedicine and the availability of in person appointments. The patient expressed understanding and agreed to proceed.  THERAPY PROGRESS NOTE  Session Time: 52 Minutes  Participation Level: Active  Behavioral Response: Well GroomedAlertDepressed  Type of Therapy: Individual Therapy  Treatment Goals addressed: Coping and Diagnosis: Depression  Interventions: CBT  Summary: Jamie [name] is a 54 y.o. female who presents with depression sxs. Pt was oriented x5 and did not endorse SI. Pt did not become tearful during session and reported she is adjusting to medication changes. Pt reported that she received and reviewed the materials from last session. Pt and spouse have scheduled time to spend alone together this weekend to practice more effective communication. Pt reported she has the opportunity to change jobs and work schedule, but is worried about how she will respond as mornings in particular are difficult for patient. Pt reported waking up with a sense of dread and needing several hours to sort out her thoughts and feelings to prepare for the day.  Suicidal/Homicidal: No  Therapist Response: Therapist met with patient for follow up session. Therapist and patient discussed upcoming transitions for patient and weighed the pros and cons. Therapist encouraged patient to come up positive associations she can pair to mornings. Pt was receptive.  Plan: Return again in 1 week.  Diagnosis: Axis I: Major Depressive Disorder, Recurrent, Moderate    Axis II: N/A    Josephine Igo, LCSW, LCAS 03/28/2020

## 2020-04-04 ENCOUNTER — Ambulatory Visit: Payer: BC Managed Care – PPO | Admitting: Licensed Clinical Social Worker

## 2020-04-07 ENCOUNTER — Encounter: Payer: Self-pay | Admitting: Psychiatry

## 2020-04-07 ENCOUNTER — Other Ambulatory Visit: Payer: Self-pay

## 2020-04-07 ENCOUNTER — Ambulatory Visit (INDEPENDENT_AMBULATORY_CARE_PROVIDER_SITE_OTHER): Payer: PRIVATE HEALTH INSURANCE | Admitting: Psychiatry

## 2020-04-07 DIAGNOSIS — F411 Generalized anxiety disorder: Secondary | ICD-10-CM | POA: Diagnosis not present

## 2020-04-07 DIAGNOSIS — F5105 Insomnia due to other mental disorder: Secondary | ICD-10-CM

## 2020-04-07 DIAGNOSIS — F331 Major depressive disorder, recurrent, moderate: Secondary | ICD-10-CM | POA: Diagnosis not present

## 2020-04-07 MED ORDER — GABAPENTIN 300 MG PO CAPS: 300.0000 mg | ORAL_CAPSULE | Freq: Two times a day (BID) | ORAL | 1 refills | Status: DC

## 2020-04-07 MED ORDER — FLUOXETINE HCL 20 MG PO CAPS: 20.0000 mg | ORAL_CAPSULE | Freq: Every day | ORAL | 1 refills | Status: DC

## 2020-04-07 MED ORDER — ARIPIPRAZOLE 10 MG PO TABS: 10.0000 mg | ORAL_TABLET | Freq: Every day | ORAL | 0 refills | Status: DC

## 2020-04-07 MED ORDER — FLUOXETINE HCL 40 MG PO CAPS: 40.0000 mg | ORAL_CAPSULE | Freq: Every day | ORAL | 1 refills | Status: DC

## 2020-04-07 NOTE — Patient Instructions (Signed)
Follow-up in clinic April 30 at 9:30 AM

## 2020-04-07 NOTE — Progress Notes (Signed)
Provider Location : [facility] Patient Location : Home  Virtual Visit via Video Note  I connected with Jamie [name] on 04/07/20 at  8:30 AM EDT by a video enabled telemedicine application and verified that I am speaking with the correct person using two identifiers.   I discussed the limitations of evaluation and management by telemedicine and the availability of in person appointments. The patient expressed understanding and agreed to proceed.    I discussed the assessment and treatment plan with the patient. The patient was provided an opportunity to ask questions and all were answered. The patient agreed with the plan and demonstrated an understanding of the instructions.   The patient was advised to call back or seek an in-person evaluation if the symptoms worsen or if the condition fails to improve as anticipated.   [facility] MD OP Progress Note  04/07/2020 9:01 AM Jamie [name]  MRN:  [id number]  Chief Complaint:  Chief Complaint    Follow-up     HPI: Jamie [name] is a 54 year old Caucasian female, married, lives in [place], employed, has a history of MDD, insomnia, chronic pain was evaluated by telemedicine today.   Patient today reports she is currently feeling anxious and depressed.  Her symptoms has been worsening since the past few days.  She reports she went on vacation and lost her bag of medications somewhere.  She hence has not taken her medications in the past 2 days.  She reports prior to that the Prozac increased dosage was beneficial.  She however continued to have some anxiety symptoms although it was improving.  Patient denies any suicidality, homicidality or perceptual disturbances.  She reports sleep as improving on the current medication regimen.  She does have to be up to help her children during the day.  She works night shifts.  That does interrupt her sleep often.  She is trying to work on that.  She has applied for several new jobs and has had interviews and is awaiting  a call back.  Patient reports she continues to be motivated to stay in psychotherapy sessions.  Since her therapist is not going to be in clinic for the next few months she is planning to get established with another therapist in the meantime.  Patient denies any other concerns today. Visit Diagnosis:    ICD-10-CM   1. MDD (major depressive disorder), recurrent episode, moderate (HCC)  F33.1 ARIPiprazole (ABILIFY) 10 MG tablet    FLUoxetine (PROZAC) 40 MG capsule    FLUoxetine (PROZAC) 20 MG capsule    gabapentin (NEURONTIN) 300 MG capsule  2. Insomnia due to mental condition  F51.05   3. GAD (generalized anxiety disorder)  F41.1 FLUoxetine (PROZAC) 40 MG capsule    FLUoxetine (PROZAC) 20 MG capsule    Past Psychiatric History: I have reviewed past psychiatric history from my progress note on 03/08/2019.  Past Medical History:  Past Medical History:  Diagnosis Date  . Anxiety   . Brachial neuritis   . Chronic neck pain   . Chronic pain not due to malignancy   . Depressive disorder    followed by Dr. Alethia Berthold.  . Disturbance, sleep   . Elevated blood pressure reading without diagnosis of hypertension   . Embolism and thrombosis of splenic artery   . H/O thrombophlebitis   . Infarction of spleen   . Lumbosacral neuritis   . Obesity, Class II, BMI 35-39.9, with comorbidity     Past Surgical History:  Procedure Laterality Date  .  CESAREAN SECTION  09/24/2010  . TONSILLECTOMY AND ADENOIDECTOMY  09/24/2010  . TUBAL LIGATION  09/24/2010    Family Psychiatric History: I have reviewed family psychiatric history from my progress note on 03/08/2019.  Family History:  Family History  Problem Relation Age of Onset  . Anxiety disorder Mother   . Depression Mother   . Alcohol abuse Brother     Social History: I have reviewed social history from my progress note on 03/08/2019. Social History   Socioeconomic History  . Marital status: Married    Spouse name: rodney  . Number of  children: 3  . Years of education: Not on file  . Highest education level: Some college, no degree  Occupational History    Comment: full time  Tobacco Use  . Smoking status: Never Smoker  . Smokeless tobacco: Never Used  Substance and Sexual Activity  . Alcohol use: Yes    Alcohol/week: 0.0 standard drinks    Comment: social  . Drug use: Yes    Types: Marijuana    Comment: 6 mths ago  . Sexual activity: Yes    Partners: Male    Birth control/protection: Surgical    Comment: Tubal ligation   Other Topics Concern  . Not on file  Social History Narrative  . Not on file   Social Determinants of Health   Financial Resource Strain:   . Difficulty of Paying Living Expenses:   Food Insecurity:   . Worried About Charity fundraiser in the Last Year:   . Arboriculturist in the Last Year:   Transportation Needs:   . Film/video editor (Medical):   Marland Kitchen Lack of Transportation (Non-Medical):   Physical Activity:   . Days of Exercise per Week:   . Minutes of Exercise per Session:   Stress:   . Feeling of Stress :   Social Connections:   . Frequency of Communication with Friends and Family:   . Frequency of Social Gatherings with Friends and Family:   . Attends Religious Services:   . Active Member of Clubs or Organizations:   . Attends Archivist Meetings:   Marland Kitchen Marital Status:     Allergies:  Allergies  Allergen Reactions  . Neuromuscular Blocking Agents   . Hydrocodone-Acetaminophen Nausea Only    Metabolic Disorder Labs: No results found for: HGBA1C, MPG No results found for: PROLACTIN No results found for: CHOL, TRIG, HDL, CHOLHDL, VLDL, LDLCALC No results found for: TSH  Therapeutic Level Labs: No results found for: LITHIUM No results found for: VALPROATE No components found for:  CBMZ  Current Medications: Current Outpatient Medications  Medication Sig Dispense Refill  . ARIPiprazole (ABILIFY) 10 MG tablet Take 1 tablet (10 mg total) by mouth  daily. 90 tablet 0  . cyclobenzaprine (FLEXERIL) 10 MG tablet Take 10 mg by mouth 3 (three) times daily.    Marland Kitchen doxepin (SINEQUAN) 10 MG capsule Take 1-2 capsules (10-20 mg total) by mouth at bedtime. 180 capsule 0  . estradiol (ESTRACE VAGINAL) 0.1 MG/GM vaginal cream Place 1 g vaginally 3 (three) times a week. 42.5 g 3  . FLUoxetine (PROZAC) 20 MG capsule Take 1 capsule (20 mg total) by mouth daily. To be combined with 40 mg 30 capsule 1  . FLUoxetine (PROZAC) 40 MG capsule Take 1 capsule (40 mg total) by mouth daily. To be combined with 20 mg 30 capsule 1  . gabapentin (NEURONTIN) 300 MG capsule Take 1 capsule (300 mg total) by  mouth 2 (two) times daily. 60 capsule 1  . ibuprofen (ADVIL,MOTRIN) 800 MG tablet Take 800 mg by mouth every 8 (eight) hours as needed.    . meloxicam (MOBIC) 15 MG tablet Take 15 mg by mouth daily.    . methylPREDNISolone (MEDROL DOSEPAK) 4 MG TBPK tablet See admin instructions.    . traMADol (ULTRAM) 50 MG tablet Take 50 mg by mouth every 8 (eight) hours as needed.     No current facility-administered medications for this visit.     Musculoskeletal: Strength & Muscle Tone: UTA Gait & Station: normal Patient leans: N/A  Psychiatric Specialty Exam: Review of Systems  Psychiatric/Behavioral: Positive for dysphoric mood. The patient is nervous/anxious.   All other systems reviewed and are negative.   There were no vitals taken for this visit.There is no height or weight on file to calculate BMI.  General Appearance: Casual  Eye Contact:  Fair  Speech:  Clear and Coherent  Volume:  Normal  Mood:  Anxious and Dysphoric  Affect:  Congruent  Thought Process:  Goal Directed and Descriptions of Associations: Intact  Orientation:  Full (Time, Place, and Person)  Thought Content: Logical   Suicidal Thoughts:  No  Homicidal Thoughts:  No  Memory:  Immediate;   Fair Recent;   Fair Remote;   Fair  Judgement:  Fair  Insight:  Fair  Psychomotor Activity:  Normal   Concentration:  Concentration: Fair and Attention Span: Fair  Recall:  [employer] of Knowledge: Fair  Language: Fair  Akathisia:  No  Handed:  Right  AIMS (if indicated): UTA  Assets:  Communication Skills Desire for Improvement Housing Social Support  ADL's:  Intact  Cognition: WNL  Sleep:  Fair   Screenings: PHQ2-9     Office Visit from 03/15/2020 in [facility] Office Visit from 10/11/2016 in [facility] Office Visit from 06/26/2016 in [facility] Office Visit from 03/13/2016 in [facility] Office Visit from 11/14/2015 in [facility]  PHQ-2 Total Score  6  2  1  1  2   PHQ-9 Total Score  17  8  --  --  8       Assessment and Plan: Jamie [name] is a 54 year old Caucasian female who has a history of MDD, insomnia, chronic pain was evaluated by telemedicine today.  Patient with psychosocial stressors of the current pandemic, her work schedule, relationship struggles and children doing remote learning.  Patient reports she has been noncompliant with her medications the past 2 days since she lost her medication supplies.  She reports progress on the Prozac increased dosage.  We will continue to make medication readjustment.  Plan as noted below.  Plan MDD-improving Increase Prozac to 60 mg p.o. daily with a meal. Abilify 10 mg p.o. daily Gabapentin 300 mg p.o. twice daily  Insomnia-restless Doxepin 10 to 20 mg p.o. nightly.  She uses it only as needed. She continues to have to work on sleep hygiene techniques.  Patient encouraged to continue psychotherapy sessions.  Follow-up in clinic in 3 weeks or sooner if needed.  I have spent atleast 20 minutes non face to face with patient today. More than 50 % of the time was spent for preparing to see the patient ( e.g., review of test, records ),  ordering medications and test ,psychoeducation and supportive psychotherapy and care  coordination,as well as documenting clinical information in electronic health record. This note was generated in part  or whole with voice recognition software. Voice recognition is usually quite accurate but there are transcription errors that can and very often do occur. I apologize for any typographical errors that were not detected and corrected.       Jamie Alert, MD 04/07/2020, 9:01 AM

## 2020-04-18 ENCOUNTER — Ambulatory Visit: Payer: BC Managed Care – PPO | Admitting: Licensed Clinical Social Worker

## 2020-04-28 ENCOUNTER — Telehealth (INDEPENDENT_AMBULATORY_CARE_PROVIDER_SITE_OTHER): Payer: BC Managed Care – PPO | Admitting: Psychiatry

## 2020-04-28 ENCOUNTER — Other Ambulatory Visit: Payer: Self-pay

## 2020-04-28 DIAGNOSIS — Z5329 Procedure and treatment not carried out because of patient's decision for other reasons: Secondary | ICD-10-CM

## 2020-04-28 NOTE — Progress Notes (Signed)
No response to call or text or video invite  

## 2020-05-19 ENCOUNTER — Other Ambulatory Visit: Payer: Self-pay

## 2020-05-19 ENCOUNTER — Telehealth (INDEPENDENT_AMBULATORY_CARE_PROVIDER_SITE_OTHER): Payer: Self-pay | Admitting: Psychiatry

## 2020-05-19 ENCOUNTER — Encounter: Payer: Self-pay | Admitting: Psychiatry

## 2020-05-19 DIAGNOSIS — F5105 Insomnia due to other mental disorder: Secondary | ICD-10-CM

## 2020-05-19 DIAGNOSIS — F331 Major depressive disorder, recurrent, moderate: Secondary | ICD-10-CM

## 2020-05-19 DIAGNOSIS — F411 Generalized anxiety disorder: Secondary | ICD-10-CM

## 2020-05-19 MED ORDER — FLUOXETINE HCL 40 MG PO CAPS: 80.0000 mg | ORAL_CAPSULE | Freq: Every day | ORAL | 1 refills | Status: DC

## 2020-05-19 NOTE — Progress Notes (Signed)
Provider Location : [facility] Patient Location : Home  Virtual Visit via Video Note  I connected with Jamie [name] on 05/19/20 at 11:30 AM EDT by a video enabled telemedicine application and verified that I am speaking with the correct person using two identifiers.   I discussed the limitations of evaluation and management by telemedicine and the availability of in person appointments. The patient expressed understanding and agreed to proceed.    I discussed the assessment and treatment plan with the patient. The patient was provided an opportunity to ask questions and all were answered. The patient agreed with the plan and demonstrated an understanding of the instructions.   The patient was advised to call back or seek an in-person evaluation if the symptoms worsen or if the condition fails to improve as anticipated.    [facility] MD OP Progress Note  05/19/2020 12:44 PM Jamie [name]  MRN:  [id number]  Chief Complaint:  Chief Complaint    Follow-up     HPI: Jamie [name] is a 54 year old Caucasian female, married, lives in [place], employed, has a history of MDD, insomnia, chronic pain was evaluated by telemedicine today.  A video call was initiated however due to connection problem it had to be changed to a phone call.   Patient today reports she continues to struggle with depression.  She reports she often feels bored.  She reports she does not know if this is depression or not since she just feels tired of everything all the time.  She reports she had a breakdown at work recently since they put a lot of workload on her.  She reports she was able to get a change of job to a new location however that has not helped much.  She is currently looking for another job.  Patient reports she continues to work at night and try to sleep during the day.  She however has to help her children during the day and hence sleep continues to be restless.  She does take doxepin as needed for sleep.  She  reports appetite is fair.  Patient denies any suicidality, homicidality or perceptual disturbances.  Patient denies any other concerns today.  Patient agrees to restart psychotherapy sessions.    Visit Diagnosis:    ICD-10-CM   1. MDD (major depressive disorder), recurrent episode, moderate (HCC)  F33.1 FLUoxetine (PROZAC) 40 MG capsule  2. Insomnia due to mental condition  F51.05   3. GAD (generalized anxiety disorder)  F41.1 FLUoxetine (PROZAC) 40 MG capsule    Past Psychiatric History: I have reviewed past psychiatric history from my progress note on 03/08/2019  Past Medical History:  Past Medical History:  Diagnosis Date  . Anxiety   . Brachial neuritis   . Chronic neck pain   . Chronic pain not due to malignancy   . Depressive disorder    followed by Dr. Alethia Berthold.  . Disturbance, sleep   . Elevated blood pressure reading without diagnosis of hypertension   . Embolism and thrombosis of splenic artery   . H/O thrombophlebitis   . Infarction of spleen   . Lumbosacral neuritis   . Obesity, Class II, BMI 35-39.9, with comorbidity     Past Surgical History:  Procedure Laterality Date  . CESAREAN SECTION  09/24/2010  . TONSILLECTOMY AND ADENOIDECTOMY  09/24/2010  . TUBAL LIGATION  09/24/2010    Family Psychiatric History: I have reviewed family psychiatric history from my progress note on 03/08/2019 Family History:  Family History  Problem Relation Age of Onset  . Anxiety disorder Mother   . Depression Mother   . Alcohol abuse Brother     Social History: I have reviewed social history from my progress note on 03/08/2019 Social History   Socioeconomic History  . Marital status: Married    Spouse name: rodney  . Number of children: 3  . Years of education: Not on file  . Highest education level: Some college, no degree  Occupational History    Comment: full time  Tobacco Use  . Smoking status: Never Smoker  . Smokeless tobacco: Never Used  Substance and  Sexual Activity  . Alcohol use: Yes    Alcohol/week: 0.0 standard drinks    Comment: social  . Drug use: Yes    Types: Marijuana    Comment: 6 mths ago  . Sexual activity: Yes    Partners: Male    Birth control/protection: Surgical    Comment: Tubal ligation   Other Topics Concern  . Not on file  Social History Narrative  . Not on file   Social Determinants of Health   Financial Resource Strain:   . Difficulty of Paying Living Expenses:   Food Insecurity:   . Worried About Charity fundraiser in the Last Year:   . Arboriculturist in the Last Year:   Transportation Needs:   . Film/video editor (Medical):   Marland Kitchen Lack of Transportation (Non-Medical):   Physical Activity:   . Days of Exercise per Week:   . Minutes of Exercise per Session:   Stress:   . Feeling of Stress :   Social Connections:   . Frequency of Communication with Friends and Family:   . Frequency of Social Gatherings with Friends and Family:   . Attends Religious Services:   . Active Member of Clubs or Organizations:   . Attends Archivist Meetings:   Marland Kitchen Marital Status:     Allergies:  Allergies  Allergen Reactions  . Neuromuscular Blocking Agents   . Hydrocodone-Acetaminophen Nausea Only    Metabolic Disorder Labs: No results found for: HGBA1C, MPG No results found for: PROLACTIN No results found for: CHOL, TRIG, HDL, CHOLHDL, VLDL, LDLCALC No results found for: TSH  Therapeutic Level Labs: No results found for: LITHIUM No results found for: VALPROATE No components found for:  CBMZ  Current Medications: Current Outpatient Medications  Medication Sig Dispense Refill  . ARIPiprazole (ABILIFY) 10 MG tablet Take 1 tablet (10 mg total) by mouth daily. 90 tablet 0  . cyclobenzaprine (FLEXERIL) 10 MG tablet Take 10 mg by mouth 3 (three) times daily.    Marland Kitchen doxepin (SINEQUAN) 10 MG capsule Take 1-2 capsules (10-20 mg total) by mouth at bedtime. 180 capsule 0  . estradiol (ESTRACE VAGINAL)  0.1 MG/GM vaginal cream Place 1 g vaginally 3 (three) times a week. 42.5 g 3  . FLUoxetine (PROZAC) 40 MG capsule Take 2 capsules (80 mg total) by mouth daily. 60 capsule 1  . gabapentin (NEURONTIN) 300 MG capsule Take 1 capsule (300 mg total) by mouth 2 (two) times daily. 60 capsule 1  . ibuprofen (ADVIL,MOTRIN) 800 MG tablet Take 800 mg by mouth every 8 (eight) hours as needed.    . meloxicam (MOBIC) 15 MG tablet Take 15 mg by mouth daily.    . methylPREDNISolone (MEDROL DOSEPAK) 4 MG TBPK tablet See admin instructions.    . traMADol (ULTRAM) 50 MG tablet Take 50 mg by mouth every 8 (eight) hours  as needed.     No current facility-administered medications for this visit.     Musculoskeletal: Strength & Muscle Tone: UTA Gait & Station: UTA Patient leans: N/A  Psychiatric Specialty Exam: Review of Systems  Psychiatric/Behavioral: Positive for dysphoric mood and sleep disturbance. Negative for agitation, behavioral problems, confusion, decreased concentration, hallucinations, self-injury and suicidal ideas. The patient is not nervous/anxious and is not hyperactive.   All other systems reviewed and are negative.   There were no vitals taken for this visit.There is no height or weight on file to calculate BMI.  General Appearance: UTA  Eye Contact:  UTA  Speech:  Clear and Coherent  Volume:  Normal  Mood:  Dysphoric  Affect:  UTA  Thought Process:  Goal Directed and Descriptions of Associations: Intact  Orientation:  Full (Time, Place, and Person)  Thought Content: Logical   Suicidal Thoughts:  No  Homicidal Thoughts:  No  Memory:  Immediate;   Fair Recent;   Fair Remote;   Fair  Judgement:  Fair  Insight:  Fair  Psychomotor Activity:  UTA  Concentration:  Concentration: Fair and Attention Span: Fair  Recall:  [employer] of Knowledge: Fair  Language: Fair  Akathisia:  No  Handed:  Right  AIMS (if indicated): UTA  Assets:  Communication Skills Desire for  Improvement Housing Social Support  ADL's:  Intact  Cognition: WNL  Sleep:  Restless   Screenings: PHQ2-9     Office Visit from 03/15/2020 in [facility] Office Visit from 10/11/2016 in [facility] Office Visit from 06/26/2016 in [facility] Office Visit from 03/13/2016 in [facility] Office Visit from 11/14/2015 in [facility]  PHQ-2 Total Score  6  2  1  1  2   PHQ-9 Total Score  17  8  -  -  8       Assessment and Plan: Jamie [name] is a 54 year old Caucasian female who has a history of MDD, insomnia, chronic pain was evaluated by telemedicine today.  Patient with psychosocial stressors of the current pandemic, job related stressors relationship struggles.  Patient continues to struggle with depressive symptoms and sleep issues.  She will continue to benefit from medication readjustment and CBT.  Plan as noted below.  Plan MDD-some progress Increase Prozac to 80 mg p.o. daily with meals Abilify 10 mg p.o. daily Gabapentin 300 mg p.o. twice daily  Insomnia-stressors Patient continues to need to work on sleep hygiene techniques.  Her sleep problems are more so because of her need to be up during the day to help her children when she is supposed to sleep.   Doxepin 10 to 20 mg p.o. nightly.  Patient encouraged to restart CBT  Follow-up in clinic in 4 weeks or sooner if needed.  I have spent atleast 20 minutes non face to face with patient today. More than 50 % of the time was spent for preparing to see the patient ( e.g., review of test, records ), ordering medications and test ,psychoeducation and supportive psychotherapy and care coordination,as well as documenting clinical information in electronic health record. This note was generated in part or whole with voice recognition software. Voice recognition is usually quite accurate but there are transcription errors that can  and very often do occur. I apologize for any typographical errors that were not detected and corrected.        Ursula Alert, MD 05/19/2020, 12:44 PM

## 2020-05-24 ENCOUNTER — Telehealth (HOSPITAL_COMMUNITY): Payer: Self-pay | Admitting: Psychiatry

## 2020-05-24 ENCOUNTER — Telehealth: Payer: Self-pay

## 2020-05-24 DIAGNOSIS — F411 Generalized anxiety disorder: Secondary | ICD-10-CM

## 2020-05-24 DIAGNOSIS — F331 Major depressive disorder, recurrent, moderate: Secondary | ICD-10-CM

## 2020-05-24 DIAGNOSIS — F5105 Insomnia due to other mental disorder: Secondary | ICD-10-CM

## 2020-05-24 MED ORDER — CITALOPRAM HYDROBROMIDE 10 MG PO TABS: 10.0000 mg | ORAL_TABLET | Freq: Every day | ORAL | 0 refills | Status: DC

## 2020-05-24 NOTE — Telephone Encounter (Signed)
D:  Dr. Shea Evans referred pt to [place].  A:  Placed call to orient patient and provide her with a start date but there was no answer.  Left vm for patient to call case manager back.  Inform Dr. Shea Evans.

## 2020-05-24 NOTE — Telephone Encounter (Signed)
Spoke to patient.  She reports she is currently struggling and has depressive symptoms, racing thoughts and is mad at the world that she does not know why.  She does report her work is stressful.  She also does not know if the Prozac is contributing to her worsening symptoms.  She does have death wish off and on however currently denies any active suicidal thoughts or plan.  We will taper off Prozac.  Start Celexa 10 mg p.o. daily for 1 week and increase to 20 mg after that.  Spoke to her husband Mr. Roome treatment plan.  Discussed crisis plan.  Patient or husband to take patient to the nearest emergency department if she is in a crisis.  Also will refer her for IOP.  I have communicated with Ms. Dellia Nims.

## 2020-05-24 NOTE — Telephone Encounter (Signed)
Pt states she not doing well with the medication change.  She is crying , she states she having bad thoughts but that her husband and daughter is there with her.  She was advised to go to ER but pt states that it not that bad and she doesn't have a plan and that she is not by herself.

## 2020-05-30 ENCOUNTER — Other Ambulatory Visit: Payer: Self-pay | Admitting: Psychiatry

## 2020-05-30 DIAGNOSIS — F331 Major depressive disorder, recurrent, moderate: Secondary | ICD-10-CM

## 2020-06-06 ENCOUNTER — Other Ambulatory Visit: Payer: Self-pay

## 2020-06-06 ENCOUNTER — Encounter: Payer: Self-pay | Admitting: Psychiatry

## 2020-06-06 ENCOUNTER — Telehealth (INDEPENDENT_AMBULATORY_CARE_PROVIDER_SITE_OTHER): Payer: Self-pay | Admitting: Psychiatry

## 2020-06-06 DIAGNOSIS — F331 Major depressive disorder, recurrent, moderate: Secondary | ICD-10-CM

## 2020-06-06 DIAGNOSIS — F5105 Insomnia due to other mental disorder: Secondary | ICD-10-CM

## 2020-06-06 DIAGNOSIS — F411 Generalized anxiety disorder: Secondary | ICD-10-CM

## 2020-06-06 MED ORDER — CITALOPRAM HYDROBROMIDE 40 MG PO TABS: 40.0000 mg | ORAL_TABLET | Freq: Every day | ORAL | 1 refills | Status: DC

## 2020-06-06 MED ORDER — ARIPIPRAZOLE 10 MG PO TABS: 10.0000 mg | ORAL_TABLET | Freq: Every day | ORAL | 0 refills | Status: DC

## 2020-06-06 NOTE — Progress Notes (Signed)
Provider Location : [facility] Patient Location : Home  Virtual Visit via Video Note  I connected with Jamie [name] on 06/06/20 at 11:50 AM EDT by a video enabled telemedicine application and verified that I am speaking with the correct person using two identifiers.   I discussed the limitations of evaluation and management by telemedicine and the availability of in person appointments. The patient expressed understanding and agreed to proceed.    I discussed the assessment and treatment plan with the patient. The patient was provided an opportunity to ask questions and all were answered. The patient agreed with the plan and demonstrated an understanding of the instructions.   The patient was advised to call back or seek an in-person evaluation if the symptoms worsen or if the condition fails to improve as anticipated.   She[facility] MD OP Progress Note  06/06/2020 8:38 PM Jamie [name]  MRN:  [id number]  Chief Complaint:  Chief Complaint    Follow-up     HPI: Jamie [name]  Is a 54 year old Caucasian female, married, lives in [place], employed, has a history of MDD, insomnia, chronic pain was evaluated by telemedicine today.  A video call was initiated however due to connection problem it had to be changed to a phone call.  Patient today reports she is currently tolerating the Celexa well.  She is completely off of the Prozac.  She reports her racing thoughts have improved since being on the Celexa.  She denies side effects.  Patient reports she continues to have trouble sleeping during the day however it may have improved since kids are currently out of school and she does not have to get up to help them.  She also took a couple of days break from work which also seem to have helped.  Patient denies any suicidality, homicidality or perceptual disturbances.  She reports she has not yet been able to call her health insurance plan to inquire about the IOP.  She will make that call and let the  IOP person know if she is interested.  Patient denies any other concerns today. Visit Diagnosis:    ICD-10-CM   1. MDD (major depressive disorder), recurrent episode, moderate (HCC)  F33.1 citalopram (CELEXA) 40 MG tablet    ARIPiprazole (ABILIFY) 10 MG tablet  2. Insomnia due to mental condition  F51.05   3. GAD (generalized anxiety disorder)  F41.1     Past Psychiatric History: I have reviewed past psychiatric history from my progress note on 03/08/2019  Past Medical History:  Past Medical History:  Diagnosis Date  . Anxiety   . Brachial neuritis   . Chronic neck pain   . Chronic pain not due to malignancy   . Depressive disorder    followed by Dr. Alethia Berthold.  . Disturbance, sleep   . Elevated blood pressure reading without diagnosis of hypertension   . Embolism and thrombosis of splenic artery   . H/O thrombophlebitis   . Infarction of spleen   . Lumbosacral neuritis   . Obesity, Class II, BMI 35-39.9, with comorbidity     Past Surgical History:  Procedure Laterality Date  . CESAREAN SECTION  09/24/2010  . TONSILLECTOMY AND ADENOIDECTOMY  09/24/2010  . TUBAL LIGATION  09/24/2010    Family Psychiatric History: I have reviewed family psychiatric history from my progress note on 03/08/2019  Family History:  Family History  Problem Relation Age of Onset  . Anxiety disorder Mother   . Depression Mother   .  Alcohol abuse Brother     Social History: Reviewed social history from my progress note on 03/08/2019 Social History   Socioeconomic History  . Marital status: Married    Spouse name: rodney  . Number of children: 3  . Years of education: Not on file  . Highest education level: Some college, no degree  Occupational History    Comment: full time  Tobacco Use  . Smoking status: Never Smoker  . Smokeless tobacco: Never Used  Substance and Sexual Activity  . Alcohol use: Yes    Alcohol/week: 0.0 standard drinks    Comment: social  . Drug use: Yes    Types:  Marijuana    Comment: 6 mths ago  . Sexual activity: Yes    Partners: Male    Birth control/protection: Surgical    Comment: Tubal ligation   Other Topics Concern  . Not on file  Social History Narrative  . Not on file   Social Determinants of Health   Financial Resource Strain:   . Difficulty of Paying Living Expenses:   Food Insecurity:   . Worried About Charity fundraiser in the Last Year:   . Arboriculturist in the Last Year:   Transportation Needs:   . Film/video editor (Medical):   Marland Kitchen Lack of Transportation (Non-Medical):   Physical Activity:   . Days of Exercise per Week:   . Minutes of Exercise per Session:   Stress:   . Feeling of Stress :   Social Connections:   . Frequency of Communication with Friends and Family:   . Frequency of Social Gatherings with Friends and Family:   . Attends Religious Services:   . Active Member of Clubs or Organizations:   . Attends Archivist Meetings:   Marland Kitchen Marital Status:     Allergies:  Allergies  Allergen Reactions  . Neuromuscular Blocking Agents   . Hydrocodone-Acetaminophen Nausea Only    Metabolic Disorder Labs: No results found for: HGBA1C, MPG No results found for: PROLACTIN No results found for: CHOL, TRIG, HDL, CHOLHDL, VLDL, LDLCALC No results found for: TSH  Therapeutic Level Labs: No results found for: LITHIUM No results found for: VALPROATE No components found for:  CBMZ  Current Medications: Current Outpatient Medications  Medication Sig Dispense Refill  . ARIPiprazole (ABILIFY) 10 MG tablet Take 1 tablet (10 mg total) by mouth daily. 90 tablet 0  . citalopram (CELEXA) 40 MG tablet Take 1 tablet (40 mg total) by mouth daily. 30 tablet 1  . cyclobenzaprine (FLEXERIL) 10 MG tablet Take 10 mg by mouth 3 (three) times daily.    Marland Kitchen doxepin (SINEQUAN) 10 MG capsule Take 1-2 capsules (10-20 mg total) by mouth at bedtime. 180 capsule 0  . estradiol (ESTRACE VAGINAL) 0.1 MG/GM vaginal cream Place 1  g vaginally 3 (three) times a week. 42.5 g 3  . gabapentin (NEURONTIN) 300 MG capsule Take 1 capsule (300 mg total) by mouth 2 (two) times daily. 60 capsule 1  . ibuprofen (ADVIL,MOTRIN) 800 MG tablet Take 800 mg by mouth every 8 (eight) hours as needed.    . meloxicam (MOBIC) 15 MG tablet Take 15 mg by mouth daily.    . methylPREDNISolone (MEDROL DOSEPAK) 4 MG TBPK tablet See admin instructions.    . traMADol (ULTRAM) 50 MG tablet Take 50 mg by mouth every 8 (eight) hours as needed.     No current facility-administered medications for this visit.     Musculoskeletal: Strength &  Muscle Tone: UTA Gait & Station: UTA Patient leans: N/A  Psychiatric Specialty Exam: Review of Systems  Psychiatric/Behavioral: Positive for dysphoric mood and sleep disturbance.  All other systems reviewed and are negative.   There were no vitals taken for this visit.There is no height or weight on file to calculate BMI.  General Appearance: UTA  Eye Contact:  UTA  Speech:  Clear and Coherent  Volume:  Normal  Mood:  Dysphoric improving  Affect:  UTA  Thought Process:  Goal Directed and Descriptions of Associations: Intact  Orientation:  Full (Time, Place, and Person)  Thought Content: Logical   Suicidal Thoughts:  No  Homicidal Thoughts:  No  Memory:  Immediate;   Fair Recent;   Fair Remote;   Fair  Judgement:  Fair  Insight:  Fair  Psychomotor Activity:  UTA  Concentration:  Concentration: Fair and Attention Span: Fair  Recall:  [employer] of Knowledge: Fair  Language: Fair  Akathisia:  No  Handed:  Right  AIMS (if indicated): UTA  Assets:  Communication Skills Desire for Improvement Housing Social Support  ADL's:  Intact  Cognition: WNL  Sleep:  Improving   Screenings: PHQ2-9     Office Visit from 03/15/2020 in [facility] Office Visit from 10/11/2016 in [facility] Office Visit from 06/26/2016 in [facility] Office  Visit from 03/13/2016 in [facility] Office Visit from 11/14/2015 in [facility]  PHQ-2 Total Score  6  2  1  1  2   PHQ-9 Total Score  17  8  --  --  8       Assessment and Plan: Jamie [name] is a 54 year old Caucasian female who has a history of MDD, insomnia, chronic pain was evaluated by telemedicine today.  Patient with psychosocial stressors of the pandemic, job related stressors, relationship struggles.  Patient is currently having depressive symptoms however is currently making progress.  She will benefit from CBT and medication readjustment.  She was also referred for IOP.  Plan as noted below.  Plan MDD-improving Discontinue Prozac-patient tapered off. Increase Celexa to 40 mg p.o. daily. Abilify 10 mg p.o. daily Gabapentin 300 mg p.o. twice daily  Insomnia-improving Continue sleep hygiene techniques Doxepin 10 to 20 mg p.o. nightly as needed   Patient encouraged to restart CBT.  She does have upcoming appointment scheduled.  Patient also advised to contact her health insurance plan and contact Ms. Clark at the IOP.  Follow-up in clinic in 4 to 6 weeks or sooner if needed.  Crisis plan discussed with patient.  I have spent atleast 20 minutes non face to face with patient today. More than 50 % of the time was spent for preparing to see the patient ( e.g., review of test, records ), ordering medications and test ,psychoeducation and supportive psychotherapy and care coordination,as well as documenting clinical information in electronic health record. This note was generated in part or whole with voice recognition software. Voice recognition is usually quite accurate but there are transcription errors that can and very often do occur. I apologize for any typographical errors that were not detected and corrected.        Ursula Alert, MD 06/06/2020, 8:38 PM

## 2020-06-08 ENCOUNTER — Other Ambulatory Visit: Payer: Self-pay | Admitting: Psychiatry

## 2020-06-08 DIAGNOSIS — F331 Major depressive disorder, recurrent, moderate: Secondary | ICD-10-CM
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
## 2020-06-30 ENCOUNTER — Telehealth (INDEPENDENT_AMBULATORY_CARE_PROVIDER_SITE_OTHER): Payer: Self-pay | Admitting: Psychiatry

## 2020-06-30 ENCOUNTER — Encounter: Payer: Self-pay | Admitting: Psychiatry

## 2020-06-30 ENCOUNTER — Other Ambulatory Visit: Payer: Self-pay

## 2020-06-30 DIAGNOSIS — F411 Generalized anxiety disorder: Secondary | ICD-10-CM

## 2020-06-30 DIAGNOSIS — F331 Major depressive disorder, recurrent, moderate: Secondary | ICD-10-CM

## 2020-06-30 DIAGNOSIS — F5105 Insomnia due to other mental disorder: Secondary | ICD-10-CM

## 2020-06-30 MED ORDER — PROPRANOLOL HCL 10 MG PO TABS: 10.0000 mg | ORAL_TABLET | Freq: Every day | ORAL | 1 refills | Status: DC | PRN

## 2020-06-30 MED ORDER — GABAPENTIN 300 MG PO CAPS: 300.0000 mg | ORAL_CAPSULE | Freq: Three times a day (TID) | ORAL | 1 refills | Status: DC

## 2020-06-30 NOTE — Patient Instructions (Signed)
Propranolol Tablets What is this medicine? PROPRANOLOL (proe PRAN oh lole) is a beta blocker. It decreases the amount of work your heart has to do and helps your heart beat regularly. It treats high blood pressure and/or prevent chest pain (also called angina). It is also used after a heart attack to prevent a second one. This medicine may be used for other purposes; ask your health care provider or pharmacist if you have questions. COMMON BRAND NAME(S): Inderal What should I tell my health care provider before I take this medicine? They need to know if you have any of these conditions:  circulation problems or blood vessel disease  diabetes  history of heart attack or heart disease, vasospastic angina  kidney disease  liver disease  lung or breathing disease, like asthma or emphysema  pheochromocytoma  slow heart rate  thyroid disease  an unusual or allergic reaction to propranolol, other beta-blockers, medicines, foods, dyes, or preservatives  pregnant or trying to get pregnant  breast-feeding How should I use this medicine? Take this drug by mouth. Take it as directed on the prescription label at the same time every day. Keep taking it unless your health care provider tells you to stop. Talk to your health care provider about the use of this drug in children. Special care may be needed. Overdosage: If you think you have taken too much of this medicine contact a poison control center or emergency room at once. NOTE: This medicine is only for you. Do not share this medicine with others. What if I miss a dose? If you miss a dose, take it as soon as you can. If it is almost time for your next dose, take only that dose. Do not take double or extra doses. What may interact with this medicine? Do not take this medicine with any of the following medications:  feverfew  phenothiazines like chlorpromazine, mesoridazine, prochlorperazine, thioridazine This medicine may also  interact with the following medications:  aluminum hydroxide gel  antipyrine  antiviral medicines for HIV or AIDS  barbiturates like phenobarbital  certain medicines for blood pressure, heart disease, irregular heart beat  cimetidine  ciprofloxacin  diazepam  fluconazole  haloperidol  isoniazid  medicines for cholesterol like cholestyramine or colestipol  medicines for mental depression  medicines for migraine headache like almotriptan, eletriptan, frovatriptan, naratriptan, rizatriptan, sumatriptan, zolmitriptan  NSAIDs, medicines for pain and inflammation, like ibuprofen or naproxen  phenytoin  rifampin  teniposide  theophylline  thyroid medicines  tolbutamide  warfarin  zileuton This list may not describe all possible interactions. Give your health care provider a list of all the medicines, herbs, non-prescription drugs, or dietary supplements you use. Also tell them if you smoke, drink alcohol, or use illegal drugs. Some items may interact with your medicine. What should I watch for while using this medicine? Visit your doctor or health care professional for regular check ups. Check your blood pressure and pulse rate regularly. Ask your health care professional what your blood pressure and pulse rate should be, and when you should contact them. You may get drowsy or dizzy. Do not drive, use machinery, or do anything that needs mental alertness until you know how this drug affects you. Do not stand or sit up quickly, especially if you are an older patient. This reduces the risk of dizzy or fainting spells. Alcohol can make you more drowsy and dizzy. Avoid alcoholic drinks. This medicine may increase blood sugar. Ask your healthcare provider if changes in diet or   medicines are needed if you have diabetes. Do not treat yourself for coughs, colds, or pain while you are taking this medicine without asking your doctor or health care professional for advice. Some  ingredients may increase your blood pressure. What side effects may I notice from receiving this medicine? Side effects that you should report to your doctor or health care professional as soon as possible:  allergic reactions like skin rash, itching or hives, swelling of the face, lips, or tongue  breathing problems  cold hands or feet  difficulty sleeping, nightmares  dry peeling skin  hallucinations  muscle cramps or weakness   signs and symptoms of high blood sugar such as being more thirsty or hungry or having to urinate more than normal. You may also feel very tired or have blurry vision.  slow heart rate  swelling of the legs and ankles  vomiting Side effects that usually do not require medical attention (report to your doctor or health care professional if they continue or are bothersome):  change in sex drive or performance  diarrhea  dry sore eyes  hair loss  nausea  weak or tired This list may not describe all possible side effects. Call your doctor for medical advice about side effects. You may report side effects to FDA at 1-800-FDA-1088. Where should I keep my medicine? Keep out of the reach of children and pets. Store at room temperature between 20 and 25 degrees C (68 and 77 degrees F). Protect from light. Throw away any unused drug after the expiration date. NOTE: This sheet is a summary. It may not cover all possible information. If you have questions about this medicine, talk to your doctor, pharmacist, or health care provider.  2020 Elsevier/Gold Standard (2019-07-23 19:25:51)  

## 2020-06-30 NOTE — Progress Notes (Signed)
Provider Location : [facility] Patient Location : Home  Virtual Visit via Video Note  I connected with Jamie [name] on 06/30/20 at  9:40 AM EDT by a video enabled telemedicine application and verified that I am speaking with the correct person using two identifiers.   I discussed the limitations of evaluation and management by telemedicine and the availability of in person appointments. The patient expressed understanding and agreed to proceed.    I discussed the assessment and treatment plan with the patient. The patient was provided an opportunity to ask questions and all were answered. The patient agreed with the plan and demonstrated an understanding of the instructions.   The patient was advised to call back or seek an in-person evaluation if the symptoms worsen or if the condition fails to improve as anticipated.   [facility] MD OP Progress Note  06/30/2020 1:50 PM Jamie [name]  MRN:  [id number]  Chief Complaint:  Chief Complaint    Follow-up     HPI: Jamie [name] is a 54 year old Caucasian female, married, lives in [place], employed, has a history of MDD, insomnia, chronic pain was evaluated by telemedicine today.  A video call was initiated however due to connection problem it had to be changed to a phone call.  Patient today reports she is currently anxious, restless and fidgety.  She reports her hours at work were cut.  This is a change that they made systemwide.  She reports that does create a financial burden on her.  She reports she is trying to find another job however has not been successful with that yet.  She reports she is often worried and nervous.  She reports she is trying to communicate with her husband more openly.  He is trying to work overtime to fill the financial gap.  She reports the increased dose of Celexa has not shown much benefit with her anxiety symptoms due to the recent stressors.  She however has upcoming appointment with therapist and is motivated to  start psychotherapy sessions.  She is compliant on medications as prescribed.  Denies side effects.  Patient denies any suicidality, homicidality or perceptual disturbances.  Patient denies any other concerns today.  Visit Diagnosis:    ICD-10-CM   1. MDD (major depressive disorder), recurrent episode, moderate (HCC)  F33.1 gabapentin (NEURONTIN) 300 MG capsule    propranolol (INDERAL) 10 MG tablet  2. Insomnia due to mental condition  F51.05   3. GAD (generalized anxiety disorder)  F41.1 propranolol (INDERAL) 10 MG tablet    Past Psychiatric History: I have reviewed past psychiatric history from my progress note on 03/08/2019.  Past Medical History:  Past Medical History:  Diagnosis Date  . Anxiety   . Brachial neuritis   . Chronic neck pain   . Chronic pain not due to malignancy   . Depressive disorder    followed by Dr. Alethia Berthold.  . Disturbance, sleep   . Elevated blood pressure reading without diagnosis of hypertension   . Embolism and thrombosis of splenic artery   . H/O thrombophlebitis   . Infarction of spleen   . Lumbosacral neuritis   . Obesity, Class II, BMI 35-39.9, with comorbidity     Past Surgical History:  Procedure Laterality Date  . CESAREAN SECTION  09/24/2010  . TONSILLECTOMY AND ADENOIDECTOMY  09/24/2010  . TUBAL LIGATION  09/24/2010    Family Psychiatric History: I have reviewed family psychiatric history from my progress note on 03/08/2019.  Family History:  Family History  Problem Relation Age of Onset  . Anxiety disorder Mother   . Depression Mother   . Alcohol abuse Brother     Social History: I have reviewed social history from my progress note on 03/08/2019. Social History   Socioeconomic History  . Marital status: Married    Spouse name: rodney  . Number of children: 3  . Years of education: Not on file  . Highest education level: Some college, no degree  Occupational History    Comment: full time  Tobacco Use  . Smoking  status: Never Smoker  . Smokeless tobacco: Never Used  Vaping Use  . Vaping Use: Never used  Substance and Sexual Activity  . Alcohol use: Yes    Alcohol/week: 0.0 standard drinks    Comment: social  . Drug use: Yes    Types: Marijuana    Comment: 6 mths ago  . Sexual activity: Yes    Partners: Male    Birth control/protection: Surgical    Comment: Tubal ligation   Other Topics Concern  . Not on file  Social History Narrative  . Not on file   Social Determinants of Health   Financial Resource Strain:   . Difficulty of Paying Living Expenses:   Food Insecurity:   . Worried About Charity fundraiser in the Last Year:   . Arboriculturist in the Last Year:   Transportation Needs:   . Film/video editor (Medical):   Marland Kitchen Lack of Transportation (Non-Medical):   Physical Activity:   . Days of Exercise per Week:   . Minutes of Exercise per Session:   Stress:   . Feeling of Stress :   Social Connections:   . Frequency of Communication with Friends and Family:   . Frequency of Social Gatherings with Friends and Family:   . Attends Religious Services:   . Active Member of Clubs or Organizations:   . Attends Archivist Meetings:   Marland Kitchen Marital Status:     Allergies:  Allergies  Allergen Reactions  . Neuromuscular Blocking Agents   . Hydrocodone-Acetaminophen Nausea Only    Metabolic Disorder Labs: No results found for: HGBA1C, MPG No results found for: PROLACTIN No results found for: CHOL, TRIG, HDL, CHOLHDL, VLDL, LDLCALC No results found for: TSH  Therapeutic Level Labs: No results found for: LITHIUM No results found for: VALPROATE No components found for:  CBMZ  Current Medications: Current Outpatient Medications  Medication Sig Dispense Refill  . ARIPiprazole (ABILIFY) 10 MG tablet Take 1 tablet (10 mg total) by mouth daily. 90 tablet 0  . citalopram (CELEXA) 40 MG tablet Take 1 tablet (40 mg total) by mouth daily. 30 tablet 1  . cyclobenzaprine  (FLEXERIL) 10 MG tablet Take 10 mg by mouth 3 (three) times daily.    Marland Kitchen doxepin (SINEQUAN) 10 MG capsule Take 1-2 capsules (10-20 mg total) by mouth at bedtime. 180 capsule 0  . estradiol (ESTRACE VAGINAL) 0.1 MG/GM vaginal cream Place 1 g vaginally 3 (three) times a week. 42.5 g 3  . gabapentin (NEURONTIN) 300 MG capsule Take 1 capsule (300 mg total) by mouth 3 (three) times daily. 90 capsule 1  . ibuprofen (ADVIL,MOTRIN) 800 MG tablet Take 800 mg by mouth every 8 (eight) hours as needed.    . meloxicam (MOBIC) 15 MG tablet Take 15 mg by mouth daily.    . methylPREDNISolone (MEDROL DOSEPAK) 4 MG TBPK tablet See admin instructions.    . propranolol (INDERAL)  10 MG tablet Take 1-2 tablets (10-20 mg total) by mouth daily as needed. For breakthrough anxiety only 60 tablet 1  . traMADol (ULTRAM) 50 MG tablet Take 50 mg by mouth every 8 (eight) hours as needed.     No current facility-administered medications for this visit.     Musculoskeletal: Strength & Muscle Tone: UTA Gait & Station: UTA Patient leans: N/A  Psychiatric Specialty Exam: Review of Systems  Psychiatric/Behavioral: Positive for dysphoric mood. The patient is nervous/anxious.   All other systems reviewed and are negative.   There were no vitals taken for this visit.There is no height or weight on file to calculate BMI.  General Appearance: UTA  Eye Contact:  UTA  Speech:  Clear and Coherent  Volume:  Normal  Mood:  Anxious and Depressed  Affect:  UTA  Thought Process:  Goal Directed and Descriptions of Associations: Intact  Orientation:  Full (Time, Place, and Person)  Thought Content: Logical   Suicidal Thoughts:  No  Homicidal Thoughts:  No  Memory:  Immediate;   Fair Recent;   Fair Remote;   Fair  Judgement:  Fair  Insight:  Fair  Psychomotor Activity:  UTA  Concentration:  Concentration: Fair and Attention Span: Fair  Recall:  [employer] of Knowledge: Fair  Language: Fair  Akathisia:  No  Handed:  Right   AIMS (if indicated): UTA  Assets:  Communication Skills Desire for Improvement Housing Social Support  ADL's:  Intact  Cognition: WNL  Sleep:  Fair   Screenings: PHQ2-9     Office Visit from 03/15/2020 in [facility] Office Visit from 10/11/2016 in [facility] Office Visit from 06/26/2016 in [facility] Office Visit from 03/13/2016 in [facility] Office Visit from 11/14/2015 in [facility]  PHQ-2 Total Score 6 2 1 1 2   PHQ-9 Total Score 17 8 -- -- 8       Assessment and Plan: AMEERA TIGUE is a 54 year old Caucasian female who has a history of MDD, insomnia, chronic pain was evaluated by telemedicine today.  Patient with psychosocial stressors of the pandemic, job related stressors, relationship struggles, financial problems, currently is struggling with anxiety symptoms more so because of the recent situational stressors.  She has upcoming appointment with her therapist.  Discussed the following medication changes.  Plan as noted below.  Plan MDD-some progress Celexa 40 mg p.o. daily Abilify 10 mg p.o. daily Gabapentin as prescribed  Insomnia-improving Doxepin 10 to 20 mg p.o. nightly as needed She will work on sleep hygiene  GAD-unstable Celexa 40 mg p.o. daily Increase gabapentin to 300 mg p.o. 3 times daily Start propranolol 10 to 20 mg p.o. daily as needed for severe anxiety attacks Restart CBT, patient has upcoming appointment.  Follow-up in clinic in 3 weeks or sooner if needed.  I have spent atleast 20 minutes non  face to face with patient today. More than 50 % of the time was spent for preparing to see the patient ( e.g., review of test, records ),  ordering medications and test ,psychoeducation and supportive psychotherapy and care coordination,as well as documenting clinical information in electronic health record.  This note was generated in part or whole  with voice recognition software. Voice recognition is usually quite accurate but there are transcription errors that can and very often do occur. I apologize for any typographical errors that were not detected and corrected.      Jamie [name]  Lamarr Feenstra, MD 06/30/2020, 1:50 PM

## 2020-07-06 ENCOUNTER — Other Ambulatory Visit: Payer: Self-pay

## 2020-07-06 ENCOUNTER — Ambulatory Visit (INDEPENDENT_AMBULATORY_CARE_PROVIDER_SITE_OTHER): Payer: Self-pay | Admitting: Licensed Clinical Social Worker

## 2020-07-06 ENCOUNTER — Encounter: Payer: Self-pay | Admitting: Licensed Clinical Social Worker

## 2020-07-06 DIAGNOSIS — F331 Major depressive disorder, recurrent, moderate: Secondary | ICD-10-CM

## 2020-07-06 DIAGNOSIS — F411 Generalized anxiety disorder: Secondary | ICD-10-CM

## 2020-07-06 NOTE — Progress Notes (Signed)
Patient Location: Home  Provider Location: Home Office   Virtual Visit via Telephone Note  I connected with Jamie [name] on 07/06/20 at 11:00 AM EDT by telephone and verified that I am speaking with the correct person using two identifiers.   I discussed the limitations, risks, security and privacy concerns of performing an evaluation and management service by telephone and the availability of in person appointments. I also discussed with the patient that there may be a patient responsible charge related to this service. The patient expressed understanding and agreed to proceed.   THERAPY PROGRESS NOTE  Session Time: 42 Minutes  Participation Level: Active  Behavioral Response: AlertDepressed  Type of Therapy: Individual Therapy  Treatment Goals addressed: Anxiety, Communication: Work related stress and Coping  Interventions: CBT  Summary: Jamie [name] is a 54 y.o. female who presents with depression sxs. Pt reported "a boost in self-esteem" and increase in assertive communication skills both at home and at work since last session. Pt reported increase in anxiety around work related stress due to transition to a new position at her employer. Pt reported she is seeking other work opportunities to improve work/life balance. Pt reported decrease in depression sxs and conflict with husband.  Suicidal/Homicidal: No  Therapist Response: Therapist met with patient for follow up session after returning from leave. Therapist reviewed treatment plan and progress towards goals with patient. Pt agreed with updates and revisions to plan. Therapist discussed current stressors with patient and encouraged use of positive coping mechanisms and assertiveness skills. Pt was receptive.  Plan: Return again in 2 weeks.  Diagnosis: Axis I: Generalized Anxiety Disorder and Major Depressive, Recurrent    Axis II: N/A  Follow Up Instructions:  I discussed the treatment plan with the patient. The  patient was provided an opportunity to ask questions and all were answered. The patient agreed with the plan and demonstrated an understanding of the instructions.   The patient was advised to call back or seek an in-person evaluation if the symptoms worsen or if the condition fails to improve as anticipated.  I provided 45 minutes of non-face-to-face time during this encounter.   Yoshimi Sarr Wynelle Link, LCSW, LCAS

## 2020-07-18 ENCOUNTER — Other Ambulatory Visit: Payer: Self-pay

## 2020-07-18 ENCOUNTER — Ambulatory Visit (INDEPENDENT_AMBULATORY_CARE_PROVIDER_SITE_OTHER): Payer: Self-pay | Admitting: Licensed Clinical Social Worker

## 2020-07-18 ENCOUNTER — Encounter: Payer: Self-pay | Admitting: Licensed Clinical Social Worker

## 2020-07-18 DIAGNOSIS — F411 Generalized anxiety disorder: Secondary | ICD-10-CM

## 2020-07-18 DIAGNOSIS — F331 Major depressive disorder, recurrent, moderate: Secondary | ICD-10-CM

## 2020-07-18 NOTE — Progress Notes (Signed)
Patient Location: Home  Provider Location: Home Office   Virtual Visit via Video Note  I connected with Jamie [name] on 07/18/20 at 11:00 AM EDT by a video enabled telemedicine application and verified that I am speaking with the correct person using two identifiers.   I discussed the limitations of evaluation and management by telemedicine and the availability of in person appointments. The patient expressed understanding and agreed to proceed.  THERAPY PROGRESS NOTE  Session Time: 30 Minutes  Participation Level: Active  Behavioral Response: CasualAlertDepressed  Type of Therapy: Individual Therapy  Treatment Goals addressed: Anxiety and Coping  Interventions: CBT  Summary: Jamie [name] is a 54 y.o. female who presents with depression and sxs. Pt reported frustration with job search process and current work related stress since last session. Pt described her response to stressors and observations about ruminations that make it difficult to focus.  Suicidal/Homicidal: No  Therapist Response: Therapist met with patient for follow up session. Therapist and patient discussed patient's response to stressors and scheduling in pleasureable activities throughout the week to relieve depression and anxiety sxs. Pt was receptive.  Plan: Return again in 2 weeks.  Diagnosis: Axis I: Major Depressive Disorder, Moderate and Generalized Anxiety Disorder    Axis II: N/A  Josephine Igo, LCSW, LCAS 07/18/2020

## 2020-07-23 ENCOUNTER — Other Ambulatory Visit: Payer: Self-pay | Admitting: Psychiatry

## 2020-07-23 DIAGNOSIS — F331 Major depressive disorder, recurrent, moderate: Secondary | ICD-10-CM

## 2020-07-27 ENCOUNTER — Telehealth (INDEPENDENT_AMBULATORY_CARE_PROVIDER_SITE_OTHER): Payer: PRIVATE HEALTH INSURANCE | Admitting: Psychiatry

## 2020-07-27 ENCOUNTER — Other Ambulatory Visit: Payer: Self-pay

## 2020-07-27 DIAGNOSIS — Z5329 Procedure and treatment not carried out because of patient's decision for other reasons: Secondary | ICD-10-CM

## 2020-07-27 NOTE — Progress Notes (Signed)
No response to call or text or video invite  

## 2020-08-01 ENCOUNTER — Encounter: Payer: Self-pay | Admitting: Psychiatry

## 2020-08-01 ENCOUNTER — Other Ambulatory Visit: Payer: Self-pay

## 2020-08-01 ENCOUNTER — Telehealth (INDEPENDENT_AMBULATORY_CARE_PROVIDER_SITE_OTHER): Payer: Self-pay | Admitting: Psychiatry

## 2020-08-01 ENCOUNTER — Telehealth: Payer: Self-pay | Admitting: Psychiatry

## 2020-08-01 DIAGNOSIS — Z9189 Other specified personal risk factors, not elsewhere classified: Secondary | ICD-10-CM | POA: Insufficient documentation

## 2020-08-01 DIAGNOSIS — F331 Major depressive disorder, recurrent, moderate: Secondary | ICD-10-CM

## 2020-08-01 DIAGNOSIS — F411 Generalized anxiety disorder: Secondary | ICD-10-CM

## 2020-08-01 DIAGNOSIS — F5105 Insomnia due to other mental disorder: Secondary | ICD-10-CM

## 2020-08-01 MED ORDER — CITALOPRAM HYDROBROMIDE 40 MG PO TABS: 40.0000 mg | ORAL_TABLET | Freq: Every day | ORAL | 1 refills | Status: DC

## 2020-08-01 MED ORDER — REXULTI 0.5 MG PO TABS: 0.5000 mg | ORAL_TABLET | Freq: Every day | ORAL | 1 refills | Status: DC

## 2020-08-01 MED ORDER — GABAPENTIN 400 MG PO CAPS: 400.0000 mg | ORAL_CAPSULE | Freq: Three times a day (TID) | ORAL | 1 refills | Status: DC

## 2020-08-01 NOTE — Progress Notes (Signed)
Provider Location : [facility] Patient Location : Home  Virtual Visit via Video Note  I connected with Jamie [name] on 08/01/20 at  3:00 PM EDT by a video enabled telemedicine application and verified that I am speaking with the correct person using two identifiers.   I discussed the limitations of evaluation and management by telemedicine and the availability of in person appointments. The patient expressed understanding and agreed to proceed.     I discussed the assessment and treatment plan with the patient. The patient was provided an opportunity to ask questions and all were answered. The patient agreed with the plan and demonstrated an understanding of the instructions.   The patient was advised to call back or seek an in-person evaluation if the symptoms worsen or if the condition fails to improve as anticipated.  Jamie [name] OP Progress Note  08/01/2020 4:17 PM Jamie [name]  MRN:  [id number]  Chief Complaint:  Chief Complaint    Follow-up     HPI: Jamie [name] is a 54 year old Caucasian female, married, lives in [place], employed, has a history of MDD, insomnia, GAD, chronic pain was evaluated by telemedicine today.  Patient today appears to be nervous, anxious and continues to struggle with restlessness and sadness.  She reports she has a lot going on.  She reports she currently is struggling with work-related stressors.  She reports she is trying to find a new job however has been unable to do so.  She reports she continues to not be able to sleep during the day since she has to take care of her children who needs her help.  She works at night and that makes it difficult for her since she is not getting any rest during the day.  She hence is trying to find a new job however has not been successful with it.  Patient reports she is currently compliant on medications as prescribed.  Patient denies any suicidality, homicidality or perceptual disturbances.  Patient has been in  psychotherapy sessions and agrees to have more frequent therapy sessions  since she continues to struggle with situational stressors.  Patient denies any other concerns today.    Visit Diagnosis:    ICD-10-CM   1. MDD (major depressive disorder), recurrent episode, moderate (HCC)  F33.1 Brexpiprazole (REXULTI) 0.5 MG TABS    gabapentin (NEURONTIN) 400 MG capsule    citalopram (CELEXA) 40 MG tablet  2. Insomnia due to mental condition  F51.05   3. GAD (generalized anxiety disorder)  F41.1 gabapentin (NEURONTIN) 400 MG capsule  4. At risk for prolonged QT interval syndrome  Z91.89 EKG 12-Lead    Past Psychiatric History: I have reviewed past psychiatric history from my progress note on 03/08/2019  Past Medical History:  Past Medical History:  Diagnosis Date  . Anxiety   . Brachial neuritis   . Chronic neck pain   . Chronic pain not due to malignancy   . Depressive disorder    followed by Dr. Alethia Berthold.  . Disturbance, sleep   . Elevated blood pressure reading without diagnosis of hypertension   . Embolism and thrombosis of splenic artery   . H/O thrombophlebitis   . Infarction of spleen   . Lumbosacral neuritis   . Obesity, Class II, BMI 35-39.9, with comorbidity     Past Surgical History:  Procedure Laterality Date  . CESAREAN SECTION  09/24/2010  . TONSILLECTOMY AND ADENOIDECTOMY  09/24/2010  . TUBAL LIGATION  09/24/2010    Family Psychiatric  History: I have reviewed family psychiatric history from my progress note on 03/08/2019  Family History:  Family History  Problem Relation Age of Onset  . Anxiety disorder Mother   . Depression Mother   . Alcohol abuse Brother     Social History: I have reviewed social history from my progress note on 03/08/2019 Social History   Socioeconomic History  . Marital status: Married    Spouse name: rodney  . Number of children: 3  . Years of education: Not on file  . Highest education level: Some college, no degree   Occupational History    Comment: full time  Tobacco Use  . Smoking status: Never Smoker  . Smokeless tobacco: Never Used  Vaping Use  . Vaping Use: Never used  Substance and Sexual Activity  . Alcohol use: Yes    Alcohol/week: 0.0 standard drinks    Comment: social  . Drug use: Yes    Types: Marijuana    Comment: 6 mths ago  . Sexual activity: Yes    Partners: Male    Birth control/protection: Surgical    Comment: Tubal ligation   Other Topics Concern  . Not on file  Social History Narrative  . Not on file   Social Determinants of Health   Financial Resource Strain:   . Difficulty of Paying Living Expenses:   Food Insecurity:   . Worried About Charity fundraiser in the Last Year:   . Arboriculturist in the Last Year:   Transportation Needs:   . Film/video editor (Medical):   Marland Kitchen Lack of Transportation (Non-Medical):   Physical Activity:   . Days of Exercise per Week:   . Minutes of Exercise per Session:   Stress:   . Feeling of Stress :   Social Connections:   . Frequency of Communication with Friends and Family:   . Frequency of Social Gatherings with Friends and Family:   . Attends Religious Services:   . Active Member of Clubs or Organizations:   . Attends Archivist Meetings:   Marland Kitchen Marital Status:     Allergies:  Allergies  Allergen Reactions  . Neuromuscular Blocking Agents   . Hydrocodone-Acetaminophen Nausea Only    Metabolic Disorder Labs: No results found for: HGBA1C, MPG No results found for: PROLACTIN No results found for: CHOL, TRIG, HDL, CHOLHDL, VLDL, LDLCALC No results found for: TSH  Therapeutic Level Labs: No results found for: LITHIUM No results found for: VALPROATE No components found for:  CBMZ  Current Medications: Current Outpatient Medications  Medication Sig Dispense Refill  . Brexpiprazole (REXULTI) 0.5 MG TABS Take 1 tablet (0.5 mg total) by mouth at bedtime. 30 tablet 1  . citalopram (CELEXA) 40 MG tablet  Take 1 tablet (40 mg total) by mouth daily. 30 tablet 1  . cyclobenzaprine (FLEXERIL) 10 MG tablet Take 10 mg by mouth 3 (three) times daily.    Marland Kitchen doxepin (SINEQUAN) 10 MG capsule Take 1-2 capsules (10-20 mg total) by mouth at bedtime. 180 capsule 0  . estradiol (ESTRACE VAGINAL) 0.1 MG/GM vaginal cream Place 1 g vaginally 3 (three) times a week. 42.5 g 3  . gabapentin (NEURONTIN) 400 MG capsule Take 1 capsule (400 mg total) by mouth 3 (three) times daily. 90 capsule 1  . ibuprofen (ADVIL,MOTRIN) 800 MG tablet Take 800 mg by mouth every 8 (eight) hours as needed.    . meloxicam (MOBIC) 15 MG tablet Take 15 mg by mouth daily.    Marland Kitchen  methylPREDNISolone (MEDROL DOSEPAK) 4 MG TBPK tablet See admin instructions.    . propranolol (INDERAL) 10 MG tablet Take 1-2 tablets (10-20 mg total) by mouth daily as needed. For breakthrough anxiety only 60 tablet 1  . traMADol (ULTRAM) 50 MG tablet Take 50 mg by mouth every 8 (eight) hours as needed.     No current facility-administered medications for this visit.     Musculoskeletal: Strength & Muscle Tone: UTA Gait & Station: normal Patient leans: N/A  Psychiatric Specialty Exam: Review of Systems  Psychiatric/Behavioral: Positive for dysphoric mood and sleep disturbance. The patient is nervous/anxious.   All other systems reviewed and are negative.   There were no vitals taken for this visit.There is no height or weight on file to calculate BMI.  General Appearance: Casual  Eye Contact:  Fair  Speech:  Clear and Coherent  Volume:  Normal  Mood:  Anxious and Depressed  Affect:  Congruent  Thought Process:  Goal Directed and Descriptions of Associations: Intact  Orientation:  Full (Time, Place, and Person)  Thought Content: Logical   Suicidal Thoughts:  No  Homicidal Thoughts:  No  Memory:  Immediate;   Fair Recent;   Fair Remote;   Fair  Judgement:  Fair  Insight:  Fair  Psychomotor Activity:  Normal  Concentration:  Concentration: Fair and  Attention Span: Fair  Recall:  [employer] of Knowledge: Fair  Language: Fair  Akathisia:  No  Handed:  Right  AIMS (if indicated): UTA  Assets:  Communication Skills Desire for Improvement Housing Intimacy Social Support Talents/Skills Transportation Vocational/Educational  ADL's:  Intact  Cognition: WNL  Sleep:  Restless   Screenings: PHQ2-9     Office Visit from 03/15/2020 in [facility] Office Visit from 10/11/2016 in [facility] Office Visit from 06/26/2016 in [facility] Office Visit from 03/13/2016 in [facility] Office Visit from 11/14/2015 in [facility]  PHQ-2 Total Score 6 2 1 1 2   PHQ-9 Total Score 17 8 -- -- 8       Assessment and Plan: Jamie [name] is a 54 year old Caucasian female who has a history of MDD, insomnia, chronic pain was evaluated by telemedicine today.  Patient with psychosocial stressors of the pandemic, job related stressors, relationship struggles continues to struggle with mood and sleep problems.  She will benefit from medication readjustment as well as more frequent psychotherapy sessions.  Plan as noted below.  Plan MDD-unstable Celexa 40 mg p.o. daily Discontinue Abilify for lack of benefit and increased anxiety and agitation Start Rexulti 0.5 mg p.o. nightly Gabapentin as prescribed  GAD-unstable Celexa 40 mg p.o. daily Increase gabapentin to 400 mg p.o. 3 times daily Propranolol 10 to 20 mg p.o. daily as needed for severe anxiety attacks Patient to continue psychotherapy sessions with her therapist-Ms. Zadie Rhine advised patient to have more intensive therapy sessions.  Patient may also benefit from more intensive options like intensive outpatient or partial hospitalization program if she continues to decompensate  Insomnia-unstable Patient unable to sleep through the day since she has to be up to take care of her children.   Discussed with patient to continue to work on sleep hygiene techniques She does have doxepin 10 to 20 mg p.o. nightly as needed available.  At risk for QT syndrome-will order EKG.  Patient encouraged to get EKG done since she is at risk for QT prolongation and if she continues to need medication readjustment  EKG needs to be reviewed.  This was discussed with patient and she agrees with plan.  We will coordinate care with her therapist Ms. Zadie Rhine.  Follow-up in clinic in 3 to 4 weeks or sooner if needed.  I have spent atleast 20 minutes non face to face with patient today. More than 50 % of the time was spent for preparing to see the patient ( e.g., review of test, records ), ordering medications and test ,psychoeducation and supportive psychotherapy and care coordination,as well as documenting clinical information in electronic health record. This note was generated in part or whole with voice recognition software. Voice recognition is usually quite accurate but there are transcription errors that can and very often do occur. I apologize for any typographical errors that were not detected and corrected.       Ursula Alert, [name] 08/01/2020, 4:17 PM

## 2020-08-07 NOTE — Telephone Encounter (Signed)
pt called states that the medciation you sent in is still too expensive

## 2020-08-08 NOTE — Telephone Encounter (Signed)
Returned call to patient.  She reports Rexulti is too expensive.  Abilify was discontinued since it was not helpful and she felt more agitated and anxious on it.  Discussed with patient that she can be started on Geodon.  However advised to check with her health insurance plan to see if this is on the formulary.  Also advised to get an EKG done before starting Geodon.  She will reach out to her primary care provider.  Patient wonders whether she can restart Abilify while she is waiting for her EKG to be done.  Discussed with patient that it is okay to restart 1/2 tablet of the Abilify, 5 mg and see how she tolerates it.  If it makes her too anxious or agitated advised to stop taking it.

## 2020-09-09 ENCOUNTER — Other Ambulatory Visit: Payer: Self-pay | Admitting: Psychiatry

## 2020-09-09 ENCOUNTER — Telehealth: Payer: Self-pay | Admitting: Internal Medicine

## 2020-09-09 DIAGNOSIS — F411 Generalized anxiety disorder: Secondary | ICD-10-CM

## 2020-09-09 DIAGNOSIS — F331 Major depressive disorder, recurrent, moderate: Secondary | ICD-10-CM

## 2020-09-09 NOTE — Telephone Encounter (Signed)
Attempted to call patient again at 12;05 pm, for a second time and no answer. The # provided by on call nurse was [phone].  Left VM and noted if she still requests to talk to the on call provider, she should call the on call service again with that request.  [facility]

## 2020-09-09 NOTE — Telephone Encounter (Signed)
Attempted to call patient at 11:10 am today, Sat 09/09/2020 as requested by her after she  called the on call triage nurse. Patient is Covid +, noted had h/o clots in past (spenic infarct noted in chart). She has not been seen at the practice since 09/2016, has been almost 4 years. She noted her doctor is Dr. Rutherford Nail.  She did not answer her phone and left a message on VM that I was the physician on call calling her back and would try again soon.   [facility]

## 2020-09-09 NOTE — Telephone Encounter (Signed)
Patient called the service and remained again and I was contacted. She noted her phone was not working and remained on hold and was connected to her thru the answering service.  She noted she has had prior clots in her UE and spleen, no PE history. Diagnosed with Covid yesterday, symptoms started Monday. Still with sore throat, cough, runny nose. No fevers, no SOB, no CP, denies any swelling in her arms or legs presently. Minimum appetite, no N/V.   Discussed concerns noted with the potential of increased risk of clots with Covid, and also the potential risk of medicines used to treat/prevent. Have limited recent history on patient as has not followed up with practice in almost 4 years noted.  Also noted treatments for Covid that can include monoclonal antibodies for those at higher risk of severe illness with Covid.  If she has concerns arising clinically later today or tomorrow (weekend), she should head to an Emergency setting to be assessed immediately, including if develops CP, SOB, fevers, N/V or any pain/swelling in an extremity.  Would continue to treat symptoms presently, rest and stay isolated.  If sx's not improving or slightly worsening, can call the office on Monday, and noted again not waiting until then if more concerning sx's arise as noted above.

## 2020-09-15 ENCOUNTER — Other Ambulatory Visit: Payer: Self-pay

## 2020-09-15 ENCOUNTER — Encounter: Payer: Self-pay | Admitting: Psychiatry

## 2020-09-15 ENCOUNTER — Telehealth (INDEPENDENT_AMBULATORY_CARE_PROVIDER_SITE_OTHER): Payer: PRIVATE HEALTH INSURANCE | Admitting: Psychiatry

## 2020-09-15 DIAGNOSIS — F5105 Insomnia due to other mental disorder: Secondary | ICD-10-CM

## 2020-09-15 DIAGNOSIS — Z9189 Other specified personal risk factors, not elsewhere classified: Secondary | ICD-10-CM

## 2020-09-15 DIAGNOSIS — F411 Generalized anxiety disorder: Secondary | ICD-10-CM

## 2020-09-15 DIAGNOSIS — F331 Major depressive disorder, recurrent, moderate: Secondary | ICD-10-CM

## 2020-09-15 MED ORDER — PROPRANOLOL HCL 10 MG PO TABS: ORAL_TABLET | ORAL | 1 refills | Status: DC

## 2020-09-15 MED ORDER — CITALOPRAM HYDROBROMIDE 40 MG PO TABS: 40.0000 mg | ORAL_TABLET | Freq: Every day | ORAL | 1 refills | Status: DC

## 2020-09-15 MED ORDER — ARIPIPRAZOLE 5 MG PO TABS: 2.5000 mg | ORAL_TABLET | Freq: Every day | ORAL | 1 refills | Status: DC

## 2020-09-15 MED ORDER — GABAPENTIN 400 MG PO CAPS: 400.0000 mg | ORAL_CAPSULE | Freq: Three times a day (TID) | ORAL | 1 refills | Status: DC

## 2020-09-15 NOTE — Progress Notes (Signed)
Provider Location : [facility] Patient Location : Home  Participants: Patient , Provider  Virtual Visit via Video Note  I connected with Jamie [name] on 09/15/20 at 11:40 AM EDT by a video enabled telemedicine application and verified that I am speaking with the correct person using two identifiers.   I discussed the limitations of evaluation and management by telemedicine and the availability of in person appointments. The patient expressed understanding and agreed to proceed.    I discussed the assessment and treatment plan with the patient. The patient was provided an opportunity to ask questions and all were answered. The patient agreed with the plan and demonstrated an understanding of the instructions.   The patient was advised to call back or seek an in-person evaluation if the symptoms worsen or if the condition fails to improve as anticipated.   [facility] MD OP Progress Note  09/15/2020 12:16 PM Jamie [name]  MRN:  [id number]  Chief Complaint:  Chief Complaint    Follow-up     HPI: Jamie [name] is a 54 year old Caucasian female, married, lives in [place], employed, has a history of MDD, insomnia, GAD, chronic pain was evaluated by telemedicine today.  Patient today reports she is currently infected with COVID-19.  She reports her son initially got tested positive and then she got it.  She reports she did not get vaccinated since she never got time to do it.  She reports she got tested positive a week ago.  She currently continues to struggle with cough, fatigue, loss of sense of taste and smell, sore throat sinus pressure and so on.  She reports this does make her anxious.  She reports she is currently taking Abilify half tablet of the 5 mg as discussed over the phone few weeks ago.  She has not been able to get her EKG done yet to start the Geodon.  She however agrees to get it done once she recovers.  Patient does have some sleep issues more so because of her current  symptoms of COVID-19.  Patient also reports appetite changes, due to her loss of sense of taste she has not been eating much.  She however is making an attempt.  Patient denies any suicidality, homicidality or perceptual disturbances.  Patient reports she is currently at a new job with [employer] and hence had started working day shifts.  She however reports she had to take time off due to having tested positive for COVID-19.  Patient reports she will continue to follow-up with her primary care providers for management of her COVID-19 infection. Visit Diagnosis:    ICD-10-CM   1. MDD (major depressive disorder), recurrent episode, moderate (HCC)  F33.1 citalopram (CELEXA) 40 MG tablet    gabapentin (NEURONTIN) 400 MG capsule    propranolol (INDERAL) 10 MG tablet    ARIPiprazole (ABILIFY) 5 MG tablet  2. Insomnia due to mental condition  F51.05   3. GAD (generalized anxiety disorder)  F41.1 gabapentin (NEURONTIN) 400 MG capsule    propranolol (INDERAL) 10 MG tablet  4. At risk for prolonged QT interval syndrome  Z91.89     Past Psychiatric History: I have reviewed past psychiatric history from my progress note on 03/08/2019  Past Medical History:  Past Medical History:  Diagnosis Date  . Anxiety   . Brachial neuritis   . Chronic neck pain   . Chronic pain not due to malignancy   . Depressive disorder    followed by Dr. Alethia Berthold.  Marland Kitchen  Disturbance, sleep   . Elevated blood pressure reading without diagnosis of hypertension   . Embolism and thrombosis of splenic artery   . H/O thrombophlebitis   . Infarction of spleen   . Lumbosacral neuritis   . Obesity, Class II, BMI 35-39.9, with comorbidity     Past Surgical History:  Procedure Laterality Date  . CESAREAN SECTION  09/24/2010  . TONSILLECTOMY AND ADENOIDECTOMY  09/24/2010  . TUBAL LIGATION  09/24/2010    Family Psychiatric History: I have reviewed family psychiatric history from my progress note on 03/08/2019  Family  History:  Family History  Problem Relation Age of Onset  . Anxiety disorder Mother   . Depression Mother   . Alcohol abuse Brother     Social History: I have reviewed social history from my progress note on 03/08/2019 Social History   Socioeconomic History  . Marital status: Married    Spouse name: rodney  . Number of children: 3  . Years of education: Not on file  . Highest education level: Some college, no degree  Occupational History    Comment: full time  Tobacco Use  . Smoking status: Never Smoker  . Smokeless tobacco: Never Used  Vaping Use  . Vaping Use: Never used  Substance and Sexual Activity  . Alcohol use: Yes    Alcohol/week: 0.0 standard drinks    Comment: social  . Drug use: Yes    Types: Marijuana    Comment: 6 mths ago  . Sexual activity: Yes    Partners: Male    Birth control/protection: Surgical    Comment: Tubal ligation   Other Topics Concern  . Not on file  Social History Narrative  . Not on file   Social Determinants of Health   Financial Resource Strain:   . Difficulty of Paying Living Expenses: Not on file  Food Insecurity:   . Worried About Charity fundraiser in the Last Year: Not on file  . Ran Out of Food in the Last Year: Not on file  Transportation Needs:   . Lack of Transportation (Medical): Not on file  . Lack of Transportation (Non-Medical): Not on file  Physical Activity:   . Days of Exercise per Week: Not on file  . Minutes of Exercise per Session: Not on file  Stress:   . Feeling of Stress : Not on file  Social Connections:   . Frequency of Communication with Friends and Family: Not on file  . Frequency of Social Gatherings with Friends and Family: Not on file  . Attends Religious Services: Not on file  . Active Member of Clubs or Organizations: Not on file  . Attends Archivist Meetings: Not on file  . Marital Status: Not on file    Allergies:  Allergies  Allergen Reactions  . Neuromuscular Blocking  Agents   . Hydrocodone-Acetaminophen Nausea Only    Metabolic Disorder Labs: No results found for: HGBA1C, MPG No results found for: PROLACTIN No results found for: CHOL, TRIG, HDL, CHOLHDL, VLDL, LDLCALC No results found for: TSH  Therapeutic Level Labs: No results found for: LITHIUM No results found for: VALPROATE No components found for:  CBMZ  Current Medications: Current Outpatient Medications  Medication Sig Dispense Refill  . ARIPiprazole (ABILIFY) 5 MG tablet Take 0.5 tablets (2.5 mg total) by mouth daily. 15 tablet 1  . citalopram (CELEXA) 40 MG tablet Take 1 tablet (40 mg total) by mouth daily. 30 tablet 1  . cyclobenzaprine (FLEXERIL) 10 MG  tablet Take 10 mg by mouth 3 (three) times daily.    Marland Kitchen doxepin (SINEQUAN) 10 MG capsule Take 1-2 capsules (10-20 mg total) by mouth at bedtime. 180 capsule 0  . estradiol (ESTRACE VAGINAL) 0.1 MG/GM vaginal cream Place 1 g vaginally 3 (three) times a week. 42.5 g 3  . gabapentin (NEURONTIN) 400 MG capsule Take 1 capsule (400 mg total) by mouth 3 (three) times daily. 90 capsule 1  . ibuprofen (ADVIL,MOTRIN) 800 MG tablet Take 800 mg by mouth every 8 (eight) hours as needed.    . meloxicam (MOBIC) 15 MG tablet Take 15 mg by mouth daily.    . methylPREDNISolone (MEDROL DOSEPAK) 4 MG TBPK tablet See admin instructions.    . propranolol (INDERAL) 10 MG tablet TAKE 1 TO 2 TABLETS BY MOUTH ONCE DAILY AS NEEDED FOR BREAKTHROUGH ANXIETY ONLY 60 tablet 1  . traMADol (ULTRAM) 50 MG tablet Take 50 mg by mouth every 8 (eight) hours as needed.     No current facility-administered medications for this visit.     Musculoskeletal: Strength & Muscle Tone: UTA Gait & Station: normal Patient leans: N/A  Psychiatric Specialty Exam: Review of Systems  Constitutional: Positive for appetite change and fatigue.  HENT: Positive for congestion, sinus pain and sore throat.   Musculoskeletal: Positive for myalgias.  Psychiatric/Behavioral: Positive for  sleep disturbance. The patient is nervous/anxious.   All other systems reviewed and are negative.   There were no vitals taken for this visit.There is no height or weight on file to calculate BMI.  General Appearance: Casual  Eye Contact:  Fair  Speech:  Clear and Coherent  Volume:  Normal  Mood:  Anxious  Affect:  Congruent  Thought Process:  Goal Directed and Descriptions of Associations: Intact  Orientation:  Full (Time, Place, and Person)  Thought Content: Logical   Suicidal Thoughts:  No  Homicidal Thoughts:  No  Memory:  Immediate;   Fair Recent;   Fair Remote;   Fair  Judgement:  Fair  Insight:  Fair  Psychomotor Activity:  Normal  Concentration:  Concentration: Fair and Attention Span: Fair  Recall:  [employer] of Knowledge: Fair  Language: Fair  Akathisia:  No  Handed:  Right  AIMS (if indicated): UTA  Assets:  Communication Skills Desire for Improvement Housing Social Support  ADL's:  Intact  Cognition: WNL  Sleep:  Restless   Screenings: PHQ2-9     Office Visit from 03/15/2020 in [facility] Office Visit from 10/11/2016 in [facility] Office Visit from 06/26/2016 in [facility] Office Visit from 03/13/2016 in [facility] Office Visit from 11/14/2015 in [facility]  PHQ-2 Total Score 6 2 1 1 2   PHQ-9 Total Score 17 8 -- -- 8       Assessment and Plan: Jamie [name] is a 54 year old Caucasian female who has a history of MDD, insomnia, chronic pain was evaluated by telemedicine today.  Patient with psychosocial stressors of the current COVID-19 infection.  She is currently struggling with physical symptoms as noted above.  Discussed plan as noted below.  Plan MDD-unstable We will continue Celexa 40 mg p.o. daily Continue Abilify 2.5 mg p.o. daily. Once patient gets EKG done she can be started on Geodon.   GAD-unstable Celexa 40 mg p.o.  daily Gabapentin 400 mg p.o. 3 times daily Propranolol 10 to 20 mg p.o. daily as needed for severe anxiety attacks Patient to continue  CBT with Ms. Zadie Rhine.  Insomnia-unstable She does have doxepin 10 to 20 mg p.o. nightly as needed available. She however currently struggles with COVID-19 infection which is also contributing to her symptoms.  At risk for prolonged QT syndrome-EKG ordered-pending.  Encouraged patient to get it done.  Patient to continue to follow-up with her primary care provider for management of COVID-19.  Follow-up in clinic in 4 weeks or sooner if needed.  I have spent atleast 20 minutes face to face with patient today. More than 50 % of the time was spent for preparing to see the patient ( e.g., review of test, records ), ordering medications and test ,psychoeducation and supportive psychotherapy and care coordination,as well as documenting clinical information in electronic health record. This note was generated in part or whole with voice recognition software. Voice recognition is usually quite accurate but there are transcription errors that can and very often do occur. I apologize for any typographical errors that were not detected and corrected.        Ursula Alert, MD 09/15/2020, 12:16 PM

## 2020-10-20 ENCOUNTER — Encounter: Payer: Self-pay | Admitting: Psychiatry

## 2020-10-20 ENCOUNTER — Telehealth (INDEPENDENT_AMBULATORY_CARE_PROVIDER_SITE_OTHER): Payer: Self-pay | Admitting: Psychiatry

## 2020-10-20 ENCOUNTER — Other Ambulatory Visit: Payer: Self-pay

## 2020-10-20 DIAGNOSIS — Z9111 Patient's noncompliance with dietary regimen: Secondary | ICD-10-CM

## 2020-10-20 DIAGNOSIS — F5105 Insomnia due to other mental disorder: Secondary | ICD-10-CM

## 2020-10-20 DIAGNOSIS — F331 Major depressive disorder, recurrent, moderate: Secondary | ICD-10-CM

## 2020-10-20 DIAGNOSIS — F411 Generalized anxiety disorder: Secondary | ICD-10-CM

## 2020-10-20 DIAGNOSIS — Z91199 Patient's noncompliance with other medical treatment and regimen due to unspecified reason: Secondary | ICD-10-CM | POA: Insufficient documentation

## 2020-10-20 MED ORDER — ARIPIPRAZOLE 5 MG PO TABS: 2.5000 mg | ORAL_TABLET | Freq: Every day | ORAL | 1 refills | Status: DC

## 2020-10-20 MED ORDER — GABAPENTIN 800 MG PO TABS: 800.0000 mg | ORAL_TABLET | Freq: Two times a day (BID) | ORAL | 1 refills | Status: DC

## 2020-10-20 MED ORDER — CITALOPRAM HYDROBROMIDE 40 MG PO TABS: 40.0000 mg | ORAL_TABLET | Freq: Every day | ORAL | 1 refills | Status: DC

## 2020-10-20 MED ORDER — PROPRANOLOL HCL 10 MG PO TABS: ORAL_TABLET | ORAL | 1 refills | Status: DC

## 2020-10-20 NOTE — Progress Notes (Signed)
Virtual Visit via Video Note  I connected with Jamie [name] on 10/20/20 at  8:30 AM EDT by a video enabled telemedicine application and verified that I am speaking with the correct person using two identifiers.  Location Provider Location : [facility] Patient Location : Car  Participants: Patient , Provider    I discussed the limitations of evaluation and management by telemedicine and the availability of in person appointments. The patient expressed understanding and agreed to proceed.     I discussed the assessment and treatment plan with the patient. The patient was provided an opportunity to ask questions and all were answered. The patient agreed with the plan and demonstrated an understanding of the instructions.   The patient was advised to call back or seek an in-person evaluation if the symptoms worsen or if the condition fails to improve as anticipated.   [facility]ie [name] OP Progress Note  10/20/2020 8:56 AM Jamie [name]  MRN:  [id number]  Chief Complaint:  Chief Complaint    Follow-up     HPI: Jamie [name] is a 54 year old Caucasian female, married, lives in [place], employed, has a history of MDD, insomnia, GAD, chronic pain was evaluated by telemedicine today.  Patient today reports she is currently at her new job.  She reports she has been at this new job since the past 4 weeks.  She reports however that all the shift changes are kind of making her worried.  She reports she has a lot of negative thoughts.  When asked to elaborate this she reports that she feels sad often and also is anxious.  She denies suicidality.  She reports sleep is good.  She reports she has not been compliant with CBT however agrees to get in touch with her therapist.  Patient has been unable to get an EKG done as recommended last visit.  It was discussed that we will change her Abilify to Geodon last visit after reviewing EKG.  Patient denies any  homicidality or perceptual  disturbances.  Patient denies any other concerns today.    Visit Diagnosis:    ICD-10-CM   1. MDD (major depressive disorder), recurrent episode, moderate (HCC)  F33.1 ARIPiprazole (ABILIFY) 5 MG tablet    citalopram (CELEXA) 40 MG tablet    propranolol (INDERAL) 10 MG tablet  2. Insomnia due to mental condition  F51.05   3. GAD (generalized anxiety disorder)  F41.1 gabapentin (NEURONTIN) 800 MG tablet    propranolol (INDERAL) 10 MG tablet  4. Noncompliance with treatment plan  Z91.11     Past Psychiatric History: I have reviewed past psychiatric history from my progress note on 03/08/2019  Past Medical History:  Past Medical History:  Diagnosis Date  . Anxiety   . Brachial neuritis   . Chronic neck pain   . Chronic pain not due to malignancy   . Depressive disorder    followed by Dr. Alethia Berthold.  . Disturbance, sleep   . Elevated blood pressure reading without diagnosis of hypertension   . Embolism and thrombosis of splenic artery   . H/O thrombophlebitis   . Infarction of spleen   . Lumbosacral neuritis   . Obesity, Class II, BMI 35-39.9, with comorbidity     Past Surgical History:  Procedure Laterality Date  . CESAREAN SECTION  09/24/2010  . TONSILLECTOMY AND ADENOIDECTOMY  09/24/2010  . TUBAL LIGATION  09/24/2010    Family Psychiatric History: Reviewed family psychiatric history from my progress note on 03/08/2019  Family History:  Family History  Problem Relation Age of Onset  . Anxiety disorder Mother   . Depression Mother   . Alcohol abuse Brother     Social History: Reviewed social history from my progress note on 03/08/2019 Social History   Socioeconomic History  . Marital status: Married    Spouse name: Jamie [name]  . Number of children: 3  . Years of education: Not on file  . Highest education level: Some college, no degree  Occupational History    Comment: full time  Tobacco Use  . Smoking status: Never Smoker  . Smokeless tobacco: Never Used   Vaping Use  . Vaping Use: Never used  Substance and Sexual Activity  . Alcohol use: Yes    Alcohol/week: 0.0 standard drinks    Comment: social  . Drug use: Yes    Types: Marijuana    Comment: 6 mths ago  . Sexual activity: Yes    Partners: Male    Birth control/protection: Surgical    Comment: Tubal ligation   Other Topics Concern  . Not on file  Social History Narrative  . Not on file   Social Determinants of Health   Financial Resource Strain:   . Difficulty of Paying Living Expenses: Not on file  Food Insecurity:   . Worried About Charity fundraiser in the Last Year: Not on file  . Ran Out of Food in the Last Year: Not on file  Transportation Needs:   . Lack of Transportation (Medical): Not on file  . Lack of Transportation (Non-Medical): Not on file  Physical Activity:   . Days of Exercise per Week: Not on file  . Minutes of Exercise per Session: Not on file  Stress:   . Feeling of Stress : Not on file  Social Connections:   . Frequency of Communication with Friends and Family: Not on file  . Frequency of Social Gatherings with Friends and Family: Not on file  . Attends Religious Services: Not on file  . Active Member of Clubs or Organizations: Not on file  . Attends Archivist Meetings: Not on file  . Marital Status: Not on file    Allergies:  Allergies  Allergen Reactions  . Neuromuscular Blocking Agents   . Hydrocodone-Acetaminophen Nausea Only    Metabolic Disorder Labs: No results found for: HGBA1C, MPG No results found for: PROLACTIN No results found for: CHOL, TRIG, HDL, CHOLHDL, VLDL, LDLCALC No results found for: TSH  Therapeutic Level Labs: No results found for: LITHIUM No results found for: VALPROATE No components found for:  CBMZ  Current Medications: Current Outpatient Medications  Medication Sig Dispense Refill  . ARIPiprazole (ABILIFY) 5 MG tablet Take 0.5 tablets (2.5 mg total) by mouth daily. 15 tablet 1  . citalopram  (CELEXA) 40 MG tablet Take 1 tablet (40 mg total) by mouth daily. 30 tablet 1  . cyclobenzaprine (FLEXERIL) 10 MG tablet Take 10 mg by mouth 3 (three) times daily.    Marland Kitchen estradiol (ESTRACE VAGINAL) 0.1 MG/GM vaginal cream Place 1 g vaginally 3 (three) times a week. 42.5 g 3  . gabapentin (NEURONTIN) 800 MG tablet Take 1 tablet (800 mg total) by mouth 2 (two) times daily. 60 tablet 1  . ibuprofen (ADVIL,MOTRIN) 800 MG tablet Take 800 mg by mouth every 8 (eight) hours as needed.    . meloxicam (MOBIC) 15 MG tablet Take 15 mg by mouth daily.    . methylPREDNISolone (MEDROL DOSEPAK) 4 MG TBPK tablet See admin instructions.    Marland Kitchen  propranolol (INDERAL) 10 MG tablet TAKE 1 TO 2 TABLETS BY MOUTH ONCE DAILY AS NEEDED FOR BREAKTHROUGH ANXIETY ONLY 60 tablet 1  . traMADol (ULTRAM) 50 MG tablet Take 50 mg by mouth every 8 (eight) hours as needed.     No current facility-administered medications for this visit.     Musculoskeletal: Strength & Muscle Tone: UTA Gait & Station: normal Patient leans: N/A  Psychiatric Specialty Exam: Review of Systems  Psychiatric/Behavioral: Positive for dysphoric mood. The patient is nervous/anxious.   All other systems reviewed and are negative.   There were no vitals taken for this visit.There is no height or weight on file to calculate BMI.  General Appearance: Casual  Eye Contact:  Fair  Speech:  Clear and Coherent  Volume:  Normal  Mood:  Anxious, Sad on and off  Affect:  Congruent  Thought Process:  Goal Directed and Descriptions of Associations: Intact  Orientation:  Full (Time, Place, and Person)  Thought Content: Logical   Suicidal Thoughts:  No  Homicidal Thoughts:  No  Memory:  Immediate;   Fair Recent;   Fair Remote;   Fair  Judgement:  Fair  Insight:  Fair  Psychomotor Activity:  Normal  Concentration:  Concentration: Fair and Attention Span: Fair  Recall:  [employer] of Knowledge: Fair  Language: Fair  Akathisia:  No  Handed:  Right  AIMS:  UTA  Assets:  Communication Skills Desire for Improvement Housing Social Support  ADL's:  Intact  Cognition: WNL  Sleep:  Fair   Screenings: PHQ2-9     Office Visit from 03/15/2020 in [place] Office Visit from 10/11/2016 in [facility] Office Visit from 06/26/2016 in [facility] Office Visit from 03/13/2016 in [facility] Office Visit from 11/14/2015 in [facility]  PHQ-2 Total Score 6 2 1 1 2   PHQ-9 Total Score 17 8 -- -- 8       Assessment and Plan: GEMA RINGOLD is a 54 year old Caucasian female who has a history of MDD, insomnia, chronic pain was evaluated by telemedicine today.  Patient is currently struggling with psychosocial stressors of being at her new job.  Patient will continue to benefit from medication readjustment and continued CBT.  Patient however with noncompliance with recommendations.  Plan as noted below.  Plan MDD-progress Continue Celexa 40 mg daily.  Continue Abilify 2.5 mg daily Encouraged patient to get EKG done, will consider adding Geodon.  GAD-unstable Celexa 40 mg p.o. daily Advised patient to restart CBT since her anxiety symptoms are situational. Increase gabapentin to 800 mg twice a day.  Insomnia-improving Patient is noncompliant with doxepin.  Will discontinue. We will monitor closely.  Noncompliance with treatment plan-encouraged compliance.  Follow-up in clinic in 4 weeks or sooner if needed.  I have spent atleast 20 minutes face to face by video with patient today. More than 50 % of the time was spent for preparing to see the patient ( e.g., review of test, records ), ordering medications and test ,psychoeducation and supportive psychotherapy and care coordination,as well as documenting clinical information in electronic health record. This note was generated in part or whole with voice recognition software. Voice recognition is  usually quite accurate but there are transcription errors that can and very often do occur. I apologize for any typographical errors that were not detected and corrected.      Ursula Alert, [name] 10/20/2020, 8:56 AM

## 2020-11-21 ENCOUNTER — Encounter: Payer: Self-pay | Admitting: Psychiatry

## 2020-11-21 ENCOUNTER — Other Ambulatory Visit: Payer: Self-pay

## 2020-11-21 ENCOUNTER — Telehealth (INDEPENDENT_AMBULATORY_CARE_PROVIDER_SITE_OTHER): Payer: Self-pay | Admitting: Psychiatry

## 2020-11-21 DIAGNOSIS — F411 Generalized anxiety disorder: Secondary | ICD-10-CM

## 2020-11-21 DIAGNOSIS — F331 Major depressive disorder, recurrent, moderate: Secondary | ICD-10-CM

## 2020-11-21 DIAGNOSIS — Z9189 Other specified personal risk factors, not elsewhere classified: Secondary | ICD-10-CM

## 2020-11-21 DIAGNOSIS — F5105 Insomnia due to other mental disorder: Secondary | ICD-10-CM

## 2020-11-21 MED ORDER — BREXPIPRAZOLE 0.25 MG PO TABS: 0.2500 mg | ORAL_TABLET | Freq: Every day | ORAL | 1 refills | Status: DC

## 2020-11-21 NOTE — Patient Instructions (Signed)
Brexpiprazole oral tablets What is this medicine? BREXPIPRAZOLE (brex PIP ray zole) is an antipsychotic. It is used to treat schizophrenia. This medicine is also used in combination with antidepressants to treat major depressive disorder. This medicine may be used for other purposes; ask your health care provider or pharmacist if you have questions. COMMON BRAND NAME(S): REXULTI What should I tell my health care provider before I take this medicine? They need to know if you have any of these conditions:  dementia  diabetes  difficulty swallowing  heart disease  high cholesterol or high levels of triglycerides in the blood  history of stroke  kidney disease  liver disease  low blood counts, like low white cell, platelet, or red cell counts  Parkinson's disease  seizures  suicidal thoughts, plans, or attempt; a previous suicide attempt by you or a family member  an unusual or allergic reaction to brexpiprazole, other medicines, foods, dyes, or preservatives  pregnant or trying to get pregnant  breast-feeding How should I use this medicine? Take this medicine by mouth with a glass of water. Follow the directions on the prescription label. You can take this medicine with or without food. Take your doses at regular intervals. Do not take your medicine more often than directed. Do not stop taking except on the advice of your doctor or health care professional. A special MedGuide will be given to you by the pharmacist with each prescription and refill. Be sure to read this information carefully each time. Talk to your pediatrician regarding the use of this medicine in children. Special care may be needed. Overdosage: If you think you have taken too much of this medicine contact a poison control center or emergency room at once. NOTE: This medicine is only for you. Do not share this medicine with others. What if I miss a dose? If you miss a dose, take it as soon as you can. If it  is almost time for your next dose, take only that dose. Do not take double or extra doses. What may interact with this medicine? Do not take this medicine with any of the following medications:  aripiprazole  metoclopramide This medicine may also interact with the following medications:  clarithromycin  certain medicines for blood pressure  certain medicines for fungal infections like fluconazole, itraconazole, ketoconazole  duloxetine  fluoxetine  paroxetine  quinidine  rifampin  St. John's Wort This list may not describe all possible interactions. Give your health care provider a list of all the medicines, herbs, non-prescription drugs, or dietary supplements you use. Also tell them if you smoke, drink alcohol, or use illegal drugs. Some items may interact with your medicine. What should I watch for while using this medicine? Visit your health care professional for regular checks on your progress. Tell your health care professional if symptoms do not start to get better or if they get worse. Do not stop taking except on your health care professional's advice. You may develop a severe reaction. Your health care professional will tell you how much medicine to take. Patients and their families should watch out for new or worsening depression or thoughts of suicide. Also watch out for sudden changes in feelings such as feeling anxious, agitated, panicky, irritable, hostile, aggressive, impulsive, severely restless, overly excited and hyperactive, or not being able to sleep. If this happens, especially at the beginning of antidepressant treatment or after a change in dose, call your health care professional. Dennis Bast may get dizzy or drowsy. Do not drive,  use machinery, or do anything that needs mental alertness until you know how this medicine affects you. Do not stand or sit up quickly, especially if you are an older patient. This reduces the risk of dizzy or fainting spells. Alcohol may  interfere with the effect of this medicine. Avoid alcoholic drinks. There are have been reports of increased sexual urges or other strong urges such as gambling while taking this medicine. If you experience any of these while taking this medicine, you should report this to your health care professional as soon as possible. This medicine may cause dry eyes and blurred vision. If you wear contact lenses you may feel some discomfort. Lubricating drops may help. See your eye doctor if the problem does not go away or is severe. This medicine may increase blood sugar. Ask your health care provider if changes in diet or medicines are needed if you have diabetes. This drug can cause problems with controlling your body temperature. It can lower the response of your body to cold temperatures. If possible, stay indoors during cold weather. If you must go outdoors, wear warm clothes. It can also lower the response of your body to heat. Do not overheat. Do not over-exercise. Stay out of the sun when possible. If you must be in the sun, wear cool clothing. Drink plenty of water. If you have trouble controlling your body temperature, call your health care provider right away. What side effects may I notice from receiving this medicine? Side effects that you should report to your doctor or health care professional as soon as possible:  allergic reactions like skin rash, itching or hives, swelling of the face, lips, or tongue  breathing problems  confusion  elevated mood, decreased need for sleep, racing thoughts, impulsive behavior  fever or chills, sore throat  inability to keep still  new or increased gambling urges, sexual urges, uncontrolled spending, binge or compulsive eating, or other urges  problems with balance, talking, walking  seizures  signs and symptoms of high blood sugar such as being more thirsty or hungry or having to urinate more than normal. You may also feel very tired or have blurry  vision  signs and symptoms of low blood pressure like dizziness; feeling faint or lightheaded, falls; unusually weak or tired  signs and symptoms of neuroleptic malignant syndrome like confusion; fast or irregular heartbeat; high fever; increased sweating; stiff muscles  sudden numbness or weakness of the face, arm, or leg  suicidal thoughts or other mood changes  trouble swallowing  uncontrollable movements of the arms, face, head, mouth, neck, or upper body Side effects that usually do not require medical attention (report to your doctor or health care professional if they continue or are bothersome):  constipation  drowsiness  headache  stomach upset  weight gain This list may not describe all possible side effects. Call your doctor for medical advice about side effects. You may report side effects to FDA at 1-800-FDA-1088. Where should I keep my medicine? Keep out of the reach of children. Store at room temperature between 15 and 30 degrees C (59 and 86 degrees F). Throw away any unused medicine after the expiration date. NOTE: This sheet is a summary. It may not cover all possible information. If you have questions about this medicine, talk to your doctor, pharmacist, or health care provider.  2020 Elsevier/Gold Standard (2019-10-12 16:20:00)

## 2020-11-21 NOTE — Progress Notes (Signed)
Virtual Visit via Video Note  I connected with Jamie [name] on 11/21/20 at  8:30 AM EST by a video enabled telemedicine application and verified that I am speaking with the correct person using two identifiers.  Location Provider Location : [facility] Patient Location : Home  Participants: Patient , Provider   I discussed the limitations of evaluation and management by telemedicine and the availability of in person appointments. The patient expressed understanding and agreed to proceed.  I discussed the assessment and treatment plan with the patient. The patient was provided an opportunity to ask questions and all were answered. The patient agreed with the plan and demonstrated an understanding of the instructions.  The patient was advised to call back or seek an in-person evaluation if the symptoms worsen or if the condition fails to improve as anticipated.   [place] MD OP Progress Note  11/21/2020 9:02 AM Jamie [name]  MRN:  [id number]  Chief Complaint:  Chief Complaint    Follow-up     HPI: Jamie [name] is a 54 year old Caucasian female, married, lives in [place], employed, has a history of MDD, insomnia, GAD, chronic pain was evaluated by telemedicine today.  Patient today reports she is currently struggling with a lot of back pain.  She reports she had to go to the emergency department multiple times before they could figure out what was going on.  She is currently scheduled for a procedure at her provider on Monday.  She is currently taking prednisone as well as tramadol and muscle relaxants.  Patient reports because of her back pain she is unable to walk around a lot.  She reports however her new job requires her to do a lot of walking.  Hence this is stressful for her.  Patient became very tearful when she discussed this.  She reports she does not want to lose the job however her job has not been very understanding.  She has to work for the next few days and she is not sure how  she is going to do that.  Patient reports because of her current pain her mood symptoms has been worsening.  She reports feeling sad.  She reports sleep problems due to the pain.  Patient denies any suicidality, homicidality or perceptual disturbances.  Patient reports she was able to get her EKG done and she was told that it will be faxed to writer.  Patient is compliant on medications however has been noncompliant with psychotherapy sessions.    Visit Diagnosis:    ICD-10-CM   1. MDD (major depressive disorder), recurrent episode, moderate (HCC)  F33.1 brexpiprazole (REXULTI) TABS tablet  2. Insomnia due to mental condition  F51.05   3. GAD (generalized anxiety disorder)  F41.1   4. At risk for prolonged QT interval syndrome  Z91.89     Past Psychiatric History: I have reviewed past psychiatric history from my progress note on 03/08/2019.  Past Medical History:  Past Medical History:  Diagnosis Date  . Anxiety   . Brachial neuritis   . Chronic neck pain   . Chronic pain not due to malignancy   . Depressive disorder    followed by Dr. Alethia Berthold.  . Disturbance, sleep   . Elevated blood pressure reading without diagnosis of hypertension   . Embolism and thrombosis of splenic artery   . H/O thrombophlebitis   . Infarction of spleen   . Lumbosacral neuritis   . Obesity, Class II, BMI 35-39.9, with comorbidity  Past Surgical History:  Procedure Laterality Date  . CESAREAN SECTION  09/24/2010  . TONSILLECTOMY AND ADENOIDECTOMY  09/24/2010  . TUBAL LIGATION  09/24/2010    Family Psychiatric History: I have reviewed family psychiatric history from my progress note on 03/08/2019.  Family History:  Family History  Problem Relation Age of Onset  . Anxiety disorder Mother   . Depression Mother   . Alcohol abuse Brother     Social History: I have reviewed social history from my progress note on 03/08/2019. Social History   Socioeconomic History  . Marital status:  Married    Spouse name: rodney  . Number of children: 3  . Years of education: Not on file  . Highest education level: Some college, no degree  Occupational History    Comment: full time  Tobacco Use  . Smoking status: Never Smoker  . Smokeless tobacco: Never Used  Vaping Use  . Vaping Use: Never used  Substance and Sexual Activity  . Alcohol use: Yes    Alcohol/week: 0.0 standard drinks    Comment: social  . Drug use: Yes    Types: Marijuana    Comment: 6 mths ago  . Sexual activity: Yes    Partners: Male    Birth control/protection: Surgical    Comment: Tubal ligation   Other Topics Concern  . Not on file  Social History Narrative  . Not on file   Social Determinants of Health   Financial Resource Strain:   . Difficulty of Paying Living Expenses: Not on file  Food Insecurity:   . Worried About Charity fundraiser in the Last Year: Not on file  . Ran Out of Food in the Last Year: Not on file  Transportation Needs:   . Lack of Transportation (Medical): Not on file  . Lack of Transportation (Non-Medical): Not on file  Physical Activity:   . Days of Exercise per Week: Not on file  . Minutes of Exercise per Session: Not on file  Stress:   . Feeling of Stress : Not on file  Social Connections:   . Frequency of Communication with Friends and Family: Not on file  . Frequency of Social Gatherings with Friends and Family: Not on file  . Attends Religious Services: Not on file  . Active Member of Clubs or Organizations: Not on file  . Attends Archivist Meetings: Not on file  . Marital Status: Not on file    Allergies:  Allergies  Allergen Reactions  . Neuromuscular Blocking Agents   . Hydrocodone-Acetaminophen Nausea Only    Metabolic Disorder Labs: No results found for: HGBA1C, MPG No results found for: PROLACTIN No results found for: CHOL, TRIG, HDL, CHOLHDL, VLDL, LDLCALC No results found for: TSH  Therapeutic Level Labs: No results found for:  LITHIUM No results found for: VALPROATE No components found for:  CBMZ  Current Medications: Current Outpatient Medications  Medication Sig Dispense Refill  . predniSONE (DELTASONE) 10 MG tablet 6 day taper - Take as directed    . acetaminophen (TYLENOL) 500 MG tablet Take by mouth.    . brexpiprazole (REXULTI) TABS tablet Take 1 tablet (0.25 mg total) by mouth at bedtime. 30 tablet 1  . citalopram (CELEXA) 40 MG tablet Take 1 tablet (40 mg total) by mouth daily. 30 tablet 1  . cyclobenzaprine (FLEXERIL) 10 MG tablet Take 10 mg by mouth 3 (three) times daily.    . cyclobenzaprine (FLEXERIL) 5 MG tablet Take 5 mg by mouth  3 (three) times daily as needed.    . diazepam (VALIUM) 5 MG tablet Take by mouth.    . estradiol (ESTRACE VAGINAL) 0.1 MG/GM vaginal cream Place 1 g vaginally 3 (three) times a week. 42.5 g 3  . gabapentin (NEURONTIN) 800 MG tablet Take 1 tablet (800 mg total) by mouth 2 (two) times daily. 60 tablet 1  . ibuprofen (ADVIL,MOTRIN) 800 MG tablet Take 800 mg by mouth every 8 (eight) hours as needed.    . meloxicam (MOBIC) 15 MG tablet Take 15 mg by mouth daily.    . methocarbamol (ROBAXIN) 500 MG tablet Take 500 mg by mouth 2 (two) times daily.    . methylPREDNISolone (MEDROL DOSEPAK) 4 MG TBPK tablet See admin instructions.    Marland Kitchen oxyCODONE-acetaminophen (PERCOCET/ROXICET) 5-325 MG tablet Take by mouth.    . propranolol (INDERAL) 10 MG tablet TAKE 1 TO 2 TABLETS BY MOUTH ONCE DAILY AS NEEDED FOR BREAKTHROUGH ANXIETY ONLY 60 tablet 1  . traMADol (ULTRAM) 50 MG tablet Take 50 mg by mouth every 8 (eight) hours as needed.     No current facility-administered medications for this visit.     Musculoskeletal: Strength & Muscle Tone: UTA Gait & Station: UTA Patient leans: N/A  Psychiatric Specialty Exam: Review of Systems  Musculoskeletal: Positive for back pain.  Psychiatric/Behavioral: Positive for dysphoric mood and sleep disturbance.  All other systems reviewed and are  negative.   There were no vitals taken for this visit.There is no height or weight on file to calculate BMI.  General Appearance: Casual  Eye Contact:  Fair  Speech:  Clear and Coherent  Volume:  Normal  Mood:  Anxious and Depressed  Affect:  Tearful  Thought Process:  Goal Directed and Descriptions of Associations: Intact  Orientation:  Full (Time, Place, and Person)  Thought Content: Logical   Suicidal Thoughts:  No  Homicidal Thoughts:  No  Memory:  Immediate;   Fair Recent;   Fair Remote;   Fair  Judgement:  Fair  Insight:  Fair  Psychomotor Activity:  Normal  Concentration:  Concentration: Fair and Attention Span: Fair  Recall:  [employer] of Knowledge: Fair  Language: Fair  Akathisia:  No  Handed:  Right  AIMS (if indicated): UTA  Assets:  Communication Skills Desire for Improvement Housing Social Support  ADL's:  Intact  Cognition: WNL  Sleep:  Poor due to pain   Screenings: PHQ2-9     Office Visit from 03/15/2020 in [place] [facility] Office Visit from 10/11/2016 in [facility] Office Visit from 06/26/2016 in [facility] Office Visit from 03/13/2016 in [facility] Office Visit from 11/14/2015 in [facility]  PHQ-2 Total Score 6 2 1 1 2   PHQ-9 Total Score 17 8 -- -- 8       Assessment and Plan: CONNA TERADA is a 54 year old Caucasian female who has a history of MDD, insomnia, chronic pain was evaluated by telemedicine today.  Patient is currently struggling with worsening mood symptoms due to her current pain.  Discussed plan as noted below.  Plan MDD-unstable Continue Celexa 40 mg p.o. daily. Discontinue Abilify. Start Rexulti 0.25 mg p.o. nightly   GAD-unstable Celexa 40 mg p.o. daily Gabapentin 800 mg p.o. twice daily Encourage patient to restart CBT.  She agrees to get the therapist to call.  Insomnia-unstable due to pain Patient will benefit from  sufficient pain management.  She has upcoming procedure scheduled  on Monday. Patient does have doxepin available which she can use as needed.  She however has been noncompliant.   Discussed with patient that she cannot be provided FMLA, she will have a discussion with her supervisor.  We will have patient sign release of information to obtain most recent EKG from primary care provider.  Encouraged patient to contact therapist to restart therapy sessions.  Encouraged compliance.  Follow-up in clinic in 2 weeks or sooner if needed.  I have spent atleast 20 minutes face to face by video with patient today. More than 50 % of the time was spent for preparing to see the patient ( e.g., review of test, records ), obtaining and to review and separately obtained history , ordering medications and test ,psychoeducation and supportive psychotherapy and care coordination,as well as documenting clinical information in electronic health record. This note was generated in part or whole with voice recognition software. Voice recognition is usually quite accurate but there are transcription errors that can and very often do occur. I apologize for any typographical errors that were not detected and corrected.     Ursula Alert, MD 11/21/2020, 9:02 AM

## 2020-11-22 ENCOUNTER — Telehealth: Payer: Self-pay

## 2020-11-22 NOTE — Telephone Encounter (Signed)
pt states that the new medication going to cost $1300 and she wants something alot cheaper

## 2020-11-22 NOTE — Telephone Encounter (Signed)
per pharmacy pt has no insurance and uses a good rx card

## 2020-11-22 NOTE — Telephone Encounter (Signed)
Thank you :)

## 2020-11-22 NOTE — Telephone Encounter (Signed)
called left message to call office back need to find out if she has insurance.

## 2020-12-01 ENCOUNTER — Other Ambulatory Visit: Payer: Self-pay

## 2020-12-01 ENCOUNTER — Telehealth (INDEPENDENT_AMBULATORY_CARE_PROVIDER_SITE_OTHER): Payer: Self-pay | Admitting: Psychiatry

## 2020-12-01 DIAGNOSIS — Z5329 Procedure and treatment not carried out because of patient's decision for other reasons: Secondary | ICD-10-CM

## 2020-12-01 NOTE — Progress Notes (Signed)
No response to call or text or video invite  

## 2021-01-06 ENCOUNTER — Other Ambulatory Visit: Payer: Self-pay | Admitting: Psychiatry

## 2021-01-06 DIAGNOSIS — F411 Generalized anxiety disorder: Secondary | ICD-10-CM

## 2021-01-24 ENCOUNTER — Other Ambulatory Visit: Payer: Self-pay | Admitting: Psychiatry

## 2021-01-24 ENCOUNTER — Telehealth: Payer: Self-pay | Admitting: Psychiatry

## 2021-01-24 DIAGNOSIS — F331 Major depressive disorder, recurrent, moderate: Secondary | ICD-10-CM

## 2021-01-24 MED ORDER — CITALOPRAM HYDROBROMIDE 40 MG PO TABS: 40.0000 mg | ORAL_TABLET | Freq: Every day | ORAL | 0 refills | Status: DC

## 2021-01-24 NOTE — Telephone Encounter (Signed)
Sent Celexa to pharmacy.

## 2021-02-01 ENCOUNTER — Ambulatory Visit: Payer: Self-pay | Admitting: Licensed Clinical Social Worker

## 2021-02-01 ENCOUNTER — Other Ambulatory Visit: Payer: Self-pay

## 2021-02-07 ENCOUNTER — Other Ambulatory Visit: Payer: Self-pay | Admitting: Psychiatry

## 2021-02-07 DIAGNOSIS — F411 Generalized anxiety disorder: Secondary | ICD-10-CM

## 2021-02-09 ENCOUNTER — Encounter: Payer: Self-pay | Admitting: Psychiatry

## 2021-02-09 ENCOUNTER — Telehealth (INDEPENDENT_AMBULATORY_CARE_PROVIDER_SITE_OTHER): Payer: Self-pay | Admitting: Psychiatry

## 2021-02-09 ENCOUNTER — Other Ambulatory Visit: Payer: Self-pay

## 2021-02-09 DIAGNOSIS — F331 Major depressive disorder, recurrent, moderate: Secondary | ICD-10-CM

## 2021-02-09 DIAGNOSIS — F411 Generalized anxiety disorder: Secondary | ICD-10-CM

## 2021-02-09 DIAGNOSIS — G4701 Insomnia due to medical condition: Secondary | ICD-10-CM

## 2021-02-09 MED ORDER — BREXPIPRAZOLE 0.25 MG PO TABS
0.2500 mg | ORAL_TABLET | Freq: Every day | ORAL | 1 refills | Status: DC
Start: 2021-02-09 — End: 2021-02-23

## 2021-02-09 MED ORDER — DOXEPIN HCL 10 MG PO CAPS
10.0000 mg | ORAL_CAPSULE | Freq: Every day | ORAL | 0 refills | Status: DC
Start: 2021-02-09 — End: 2021-02-23

## 2021-02-09 NOTE — Patient Instructions (Signed)
Brexpiprazole Oral Tablets What is this medicine? BREXPIPRAZOLE (brex PIP ray zole) is an antipsychotic. It is used to treat schizophrenia. This medicine is also used in combination with antidepressants to treat major depressive disorder. This medicine may be used for other purposes; ask your health care provider or pharmacist if you have questions. COMMON BRAND NAME(S): REXULTI What should I tell my health care provider before I take this medicine? They need to know if you have any of these conditions:  dementia  diabetes  difficulty swallowing  have trouble controlling your muscles  have urges you are unable to control (for example, gambling, spending money, or eating)  heart disease  high cholesterol  history of breast cancer  history of stroke  kidney disease  liver disease  low blood counts, like low white cell, platelet, or red cell counts  low blood pressure  Parkinson's disease  seizures  suicidal thoughts, plans or attempt; a previous suicide attempt by you or a family member  an unusual or allergic reaction to brexpiprazole, other medicines, foods, dyes, or preservatives  pregnant or trying to get pregnant  breast-feeding How should I use this medicine? Take this medicine by mouth with a glass of water. Follow the directions on the prescription label. You can take this medicine with or without food. Take your doses at regular intervals. Do not take your medicine more often than directed. Do not stop taking except on the advice of your doctor or health care professional. A special MedGuide will be given to you by the pharmacist with each prescription and refill. Be sure to read this information carefully each time. Talk to your pediatrician regarding the use of this medicine in children. Special care may be needed. Overdosage: If you think you have taken too much of this medicine contact a poison control center or emergency room at once. NOTE: This medicine  is only for you. Do not share this medicine with others. What if I miss a dose? If you miss a dose, take it as soon as you can. If it is almost time for your next dose, take only that dose. Do not take double or extra doses. What may interact with this medicine? Do not take this medicine with any of the following medications:  aripiprazole  metoclopramide This medicine may also interact with the following medications:  antihistamines for allergy, cough, and cold  certain medicines for anxiety or sleep  certain medicines for depression like amitriptyline, duloxetine, fluoxetine, paroxetine, sertraline  certain medicines for fungal infections like fluconazole, itraconazole, ketoconazole  clarithromycin  general anesthetics like halothane, isoflurane, methoxyflurane, propofol  levodopa or other medicines for Parkinson's disease  medicines for blood pressure  medicines that relax muscles for surgery  medicines for seizures  narcotic medicines for pain  phenothiazines like chlorpromazine, prochlorperazine, thioridazine  quinidine  rifampin  [place]'s Wort This list may not describe all possible interactions. Give your health care provider a list of all the medicines, herbs, non-prescription drugs, or dietary supplements you use. Also tell them if you smoke, drink alcohol, or use illegal drugs. Some items may interact with your medicine. What should I watch for while using this medicine? Visit your health care professional for regular checks on your progress. Tell your health care professional if symptoms do not start to get better or if they get worse. Do not stop taking except on your health care professional's advice. You may develop a severe reaction. Your health care professional will tell you how much medicine  to take. Patients and their families should watch out for new or worsening depression or thoughts of suicide. Also watch out for sudden changes in feelings such as  feeling anxious, agitated, panicky, irritable, hostile, aggressive, impulsive, severely restless, overly excited and hyperactive, or not being able to sleep. If this happens, especially at the beginning of antidepressant treatment or after a change in dose, call your health care professional. Dennis Bast may get dizzy or drowsy. Do not drive, use machinery, or do anything that needs mental alertness until you know how this medicine affects you. Do not stand or sit up quickly, especially if you are an older patient. This reduces the risk of dizzy or fainting spells. Alcohol may interfere with the effect of this medicine. Avoid alcoholic drinks. There are have been reports of increased sexual urges or other strong urges such as gambling while taking this medicine. If you experience any of these while taking this medicine, you should report this to your health care professional as soon as possible. This medicine may cause dry eyes and blurred vision. If you wear contact lenses you may feel some discomfort. Lubricating drops may help. See your eye doctor if the problem does not go away or is severe. This medicine may increase blood sugar. Ask your health care provider if changes in diet or medicines are needed if you have diabetes. This drug can cause problems with controlling your body temperature. It can lower the response of your body to cold temperatures. If possible, stay indoors during cold weather. If you must go outdoors, wear warm clothes. It can also lower the response of your body to heat. Do not overheat. Do not over-exercise. Stay out of the sun when possible. If you must be in the sun, wear cool clothing. Drink plenty of water. If you have trouble controlling your body temperature, call your health care provider right away. What side effects may I notice from receiving this medicine? Side effects that you should report to your doctor or health care professional as soon as possible:  allergic reactions  like skin rash, itching or hives, swelling of the face, lips, or tongue  breathing problems  confusion  elevated mood, decreased need for sleep, racing thoughts, impulsive behavior  fever or chills, sore throat  inability to keep still  new or increased gambling urges, sexual urges, uncontrolled spending, binge or compulsive eating, or other urges  problems with balance, talking, walking  seizures  signs and symptoms of high blood sugar such as being more thirsty or hungry or having to urinate more than normal. You may also feel very tired or have blurry vision  signs and symptoms of low blood pressure like dizziness; feeling faint or lightheaded, falls; unusually weak or tired  signs and symptoms of neuroleptic malignant syndrome like confusion; fast or irregular heartbeat; high fever; increased sweating; stiff muscles  sudden numbness or weakness of the face, arm, or leg  suicidal thoughts or other mood changes  trouble swallowing  uncontrollable movements of the arms, face, head, mouth, neck, or upper body Side effects that usually do not require medical attention (report to your doctor or health care professional if they continue or are bothersome):  constipation  drowsiness  headache  stomach upset  weight gain This list may not describe all possible side effects. Call your doctor for medical advice about side effects. You may report side effects to FDA at 1-800-FDA-1088. Where should I keep my medicine? Keep out of the reach of children. Store  at room temperature between 15 and 30 degrees C (59 and 86 degrees F). Throw away any unused medicine after the expiration date. NOTE: This sheet is a summary. It may not cover all possible information. If you have questions about this medicine, talk to your doctor, pharmacist, or health care provider.  2021 Elsevier/Gold Standard (2019-10-28 14:29:12)

## 2021-02-09 NOTE — Progress Notes (Signed)
Virtual Visit via Telephone Note  I connected with Jamie [name] on 02/09/21 at 11:30 AM EST by telephone and verified that I am speaking with the correct person using two identifiers.  Location Provider Location : [facility] Patient Location : Work  Participants: Patient , Provider    I discussed the limitations, risks, security and privacy concerns of performing an evaluation and management service by telephone and the availability of in person appointments. I also discussed with the patient that there may be a patient responsible charge related to this service. The patient expressed understanding and agreed to proceed. I discussed the assessment and treatment plan with the patient. The patient was provided an opportunity to ask questions and all were answered. The patient agreed with the plan and demonstrated an understanding of the instructions.   The patient was advised to call back or seek an in-person evaluation if the symptoms worsen or if the condition fails to improve as anticipated.  [place] MD OP Progress Note  02/09/2021 12:43 PM Jamie [name]  MRN:  [id number]  Chief Complaint:  Chief Complaint    Follow-up     HPI: Jamie [name] is a 55 year old Caucasian female, married, lives in [place], employed, has a history of MDD, insomnia, GAD, chronic pain was evaluated by telemedicine today.  Patient today reports that she did not have any motivation to schedule an appointment and hence was noncompliant with recommendations.  Patient's last visit was on 11/21/2020.  At that visit medication changes were made and patient was advised to return in 2 weeks.  Patient today reports she is currently struggling with sadness, lack of motivation, low energy on a regular basis.  She also reports she has been struggling with sleep.  She however reports sleep is mostly because of her leg pain.  She reports her current pain is at a 10, 10 being the worst.  She reports her medications as not  beneficial.  She does have upcoming appointment with her provider for management.  She reports in spite of the pain she is pushing herself and is currently at work.  She reports she has to do that since she has to take care of her family.  Patient denies any suicidality, homicidality or perceptual disturbances.  Patient reports she has called her therapist to schedule an appointment as well and is motivated to restart therapy.  Patient denies any other concerns today.  Visit Diagnosis:    ICD-10-CM   1. MDD (major depressive disorder), recurrent episode, moderate (HCC)  F33.1 brexpiprazole (REXULTI) TABS tablet  2. Insomnia due to medical condition  G47.01    pain  3. GAD (generalized anxiety disorder)  F41.1     Past Psychiatric History: I have reviewed past psychiatric history from my progress note on 03/08/2019  Past Medical History:  Past Medical History:  Diagnosis Date  . Anxiety   . Brachial neuritis   . Chronic neck pain   . Chronic pain not due to malignancy   . Depressive disorder    followed by Dr. Alethia Berthold.  . Disturbance, sleep   . Elevated blood pressure reading without diagnosis of hypertension   . Embolism and thrombosis of splenic artery   . H/O thrombophlebitis   . Infarction of spleen   . Lumbosacral neuritis   . Obesity, Class II, BMI 35-39.9, with comorbidity     Past Surgical History:  Procedure Laterality Date  . CESAREAN SECTION  09/24/2010  . TONSILLECTOMY AND ADENOIDECTOMY  09/24/2010  .  TUBAL LIGATION  09/24/2010    Family Psychiatric History: Reviewed family psychiatric history from my progress note on 03/08/2019  Family History:  Family History  Problem Relation Age of Onset  . Anxiety disorder Mother   . Depression Mother   . Alcohol abuse Brother     Social History: I have reviewed social history from my progress note on 03/08/2019 Social History   Socioeconomic History  . Marital status: Married    Spouse name: rodney  . Number  of children: 3  . Years of education: Not on file  . Highest education level: Some college, no degree  Occupational History    Comment: full time  Tobacco Use  . Smoking status: Never Smoker  . Smokeless tobacco: Never Used  Vaping Use  . Vaping Use: Never used  Substance and Sexual Activity  . Alcohol use: Yes    Alcohol/week: 0.0 standard drinks    Comment: social  . Drug use: Yes    Types: Marijuana    Comment: 6 mths ago  . Sexual activity: Yes    Partners: Male    Birth control/protection: Surgical    Comment: Tubal ligation   Other Topics Concern  . Not on file  Social History Narrative  . Not on file   Social Determinants of Health   Financial Resource Strain: Not on file  Food Insecurity: Not on file  Transportation Needs: Not on file  Physical Activity: Not on file  Stress: Not on file  Social Connections: Not on file    Allergies:  Allergies  Allergen Reactions  . Neuromuscular Blocking Agents   . Hydrocodone-Acetaminophen Nausea Only    Metabolic Disorder Labs: No results found for: HGBA1C, MPG No results found for: PROLACTIN No results found for: CHOL, TRIG, HDL, CHOLHDL, VLDL, LDLCALC No results found for: TSH  Therapeutic Level Labs: No results found for: LITHIUM No results found for: VALPROATE No components found for:  CBMZ  Current Medications: Current Outpatient Medications  Medication Sig Dispense Refill  . acetaminophen (TYLENOL) 500 MG tablet Take by mouth.    . brexpiprazole (REXULTI) TABS tablet Take 1 tablet (0.25 mg total) by mouth at bedtime. 30 tablet 1  . citalopram (CELEXA) 40 MG tablet Take 1 tablet (40 mg total) by mouth daily. 30 tablet 0  . cyclobenzaprine (FLEXERIL) 10 MG tablet Take 10 mg by mouth 3 (three) times daily.    . cyclobenzaprine (FLEXERIL) 5 MG tablet Take 5 mg by mouth 3 (three) times daily as needed.    . diazepam (VALIUM) 5 MG tablet Take by mouth.    . estradiol (ESTRACE VAGINAL) 0.1 MG/GM vaginal cream  Place 1 g vaginally 3 (three) times a week. 42.5 g 3  . gabapentin (NEURONTIN) 800 MG tablet Take 1 tablet by mouth twice daily 60 tablet 0  . ibuprofen (ADVIL,MOTRIN) 800 MG tablet Take 800 mg by mouth every 8 (eight) hours as needed.    . meloxicam (MOBIC) 15 MG tablet Take 15 mg by mouth daily.    . methocarbamol (ROBAXIN) 500 MG tablet Take 500 mg by mouth 2 (two) times daily.    . methylPREDNISolone (MEDROL DOSEPAK) 4 MG TBPK tablet See admin instructions.    Marland Kitchen oxyCODONE-acetaminophen (PERCOCET/ROXICET) 5-325 MG tablet Take by mouth.    . predniSONE (DELTASONE) 10 MG tablet 6 day taper - Take as directed    . propranolol (INDERAL) 10 MG tablet TAKE 1 TO 2 TABLETS BY MOUTH ONCE DAILY AS NEEDED FOR BREAKTHROUGH ANXIETY  ONLY 60 tablet 1  . traMADol (ULTRAM) 50 MG tablet Take 50 mg by mouth every 8 (eight) hours as needed.     No current facility-administered medications for this visit.     Musculoskeletal: Strength & Muscle Tone: UTA Gait & Station: UTA Patient leans: N/A  Psychiatric Specialty Exam: Review of Systems  Musculoskeletal:       Leg pain  Psychiatric/Behavioral: Positive for dysphoric mood and sleep disturbance. The patient is nervous/anxious.   All other systems reviewed and are negative.   There were no vitals taken for this visit.There is no height or weight on file to calculate BMI.  General Appearance: UTA  Eye Contact:  UTA  Speech:  Clear and Coherent  Volume:  Normal  Mood:  Anxious and Dysphoric  Affect:  UTA  Thought Process:  Goal Directed and Descriptions of Associations: Intact  Orientation:  Full (Time, Place, and Person)  Thought Content: Logical   Suicidal Thoughts:  No  Homicidal Thoughts:  No  Memory:  Immediate;   Fair Recent;   Fair Remote;   Fair  Judgement:  Fair  Insight:  Fair  Psychomotor Activity:  UTA  Concentration:  Concentration: Fair and Attention Span: Fair  Recall:  [employer] of Knowledge: Fair  Language: Fair   Akathisia:  No  Handed:  Right  AIMS (if indicated): UTA  Assets:  Communication Skills Desire for Improvement Housing Social Support  ADL's:  Intact  Cognition: WNL  Sleep:  Poor Due to pain   Screenings: PHQ2-9   Flowsheet Row Video Visit from 02/09/2021 in [facility] Office Visit from 03/15/2020 in [facility] Office Visit from 10/11/2016 in [facility] Office Visit from 06/26/2016 in [facility] Office Visit from 03/13/2016 in [facility]  PHQ-2 Total Score 2 6 2 1 1   PHQ-9 Total Score 8 17 8  - -    Flowsheet Row Video Visit from 02/09/2021 in [facility] No Risk       Assessment and Plan: TERAH ROBEY is a 55 year old Caucasian female who has a history of MDD, insomnia, chronic pain was evaluated by telemedicine today.  Patient is currently noncompliant with medication regimen as well as psychotherapy sessions and reports worsening mood symptoms.  Patient with chronic pain with acute exacerbation, likely also contributing to her mood problems and sleep problems.  Patient will benefit from the following plan.  Plan MDD-unstable Celexa 40 mg p.o. daily Restart Rexulti 0.25 mg p.o. nightly-patient noncompliant.   GAD-unstable Celexa 40 mg p.o. daily Gabapentin 800 mg p.o. twice daily Encouraged patient to restart CBT.  Insomnia-unstable due to pain Patient will need sufficient pain management. Continue doxepin as needed for sleep.  She has been noncompliant. Will not add another sleep medication today.  Noncompliance with treatment regimen-provided education.  Discussed clinic policy.  Encouraged compliance.  I have reviewed EKG-dated-QT within normal limits.  Patient has upcoming appointment with her pain provider.  Patient agrees to go to the nearest urgent care if her pain worsens.  Follow-up in  clinic in 2 weeks or sooner if needed.  I have spent atleast 20 minutes non face to face with patient today. More than 50 % of the time was spent for preparing to see the patient ( e.g., review of test, records ), ordering medications and test ,psychoeducation and supportive psychotherapy and care coordination,as well as documenting clinical information in electronic health  record. This note was generated in part or whole with voice recognition software. Voice recognition is usually quite accurate but there are transcription errors that can and very often do occur. I apologize for any typographical errors that were not detected and corrected.        Jamie Alert, MD 02/09/2021, 12:43 PM

## 2021-02-16 ENCOUNTER — Telehealth: Payer: Self-pay | Admitting: Psychiatry

## 2021-02-16 NOTE — Telephone Encounter (Signed)
I have reviewed most recent EKG dated November 15, 2020-normal sinus rhythm [id number]

## 2021-02-23 ENCOUNTER — Telehealth (INDEPENDENT_AMBULATORY_CARE_PROVIDER_SITE_OTHER): Payer: BC Managed Care – PPO | Admitting: Psychiatry

## 2021-02-23 ENCOUNTER — Other Ambulatory Visit: Payer: Self-pay

## 2021-02-23 ENCOUNTER — Encounter: Payer: Self-pay | Admitting: Psychiatry

## 2021-02-23 DIAGNOSIS — F411 Generalized anxiety disorder: Secondary | ICD-10-CM

## 2021-02-23 DIAGNOSIS — F331 Major depressive disorder, recurrent, moderate: Secondary | ICD-10-CM

## 2021-02-23 DIAGNOSIS — G4701 Insomnia due to medical condition: Secondary | ICD-10-CM | POA: Insufficient documentation

## 2021-02-23 MED ORDER — CITALOPRAM HYDROBROMIDE 40 MG PO TABS: 40.0000 mg | ORAL_TABLET | Freq: Every day | ORAL | 1 refills | Status: DC

## 2021-02-23 MED ORDER — DOXEPIN HCL 10 MG PO CAPS: 10.0000 mg | ORAL_CAPSULE | Freq: Every day | ORAL | 1 refills | Status: DC

## 2021-02-23 MED ORDER — REXULTI 0.5 MG PO TABS: 0.5000 mg | ORAL_TABLET | Freq: Every day | ORAL | 1 refills | Status: DC

## 2021-02-23 NOTE — Progress Notes (Signed)
Virtual Visit via Telephone Note  I connected with Jamie [name] on 02/23/21 at  9:20 AM EST by telephone and verified that I am speaking with the correct person using two identifiers.  Location Provider Location : [facility] Patient Location : Work  Participants: Patient , Provider   I discussed the limitations, risks, security and privacy concerns of performing an evaluation and management service by telephone and the availability of in person appointments. I also discussed with the patient that there may be a patient responsible charge related to this service. The patient expressed understanding and agreed to proceed.   I discussed the assessment and treatment plan with the patient. The patient was provided an opportunity to ask questions and all were answered. The patient agreed with the plan and demonstrated an understanding of the instructions.   The patient was advised to call back or seek an in-person evaluation if the symptoms worsen or if the condition fails to improve as anticipated.   [facility] MD OP Progress Note  02/23/2021 8:52 PM Jamie [name]  MRN:  [id number]  Chief Complaint:  Chief Complaint    Follow-up     HPI: Jamie [name] is a 55 year old Caucasian female, married, lives in [place], employed, has a history of MDD, insomnia, GAD, chronic pain was evaluated by telemedicine today.  Patient today wanted to do just a phone call.  Patient today reports she is currently struggling with depressive symptoms.  She reports it is likely due to her current work schedule.  There are days when she has to work from 8 30-11 30 in the morning and other days when she has to work from 8:30 in the morning to 5:30 PM.  Patient reports the days that she has to work long hours she has trouble coping with that.  She reports the next day she feels as though she cannot get out of bed anymore and has trouble getting started.  She also reports she is in pain a lot.  She reports because of the  pain she has trouble moving in the morning.  This does have an impact on her work.  Patient reports there are days when she feels as though she cannot sleep much.  Patient reports she is compliant with her medications.  Patient denies any suicidality, homicidality or perceptual disturbances.  She is motivated to start psychotherapy sessions.  She denies any other concerns today.  Visit Diagnosis:    ICD-10-CM   1. MDD (major depressive disorder), recurrent episode, moderate (HCC)  F33.1 Brexpiprazole (REXULTI) 0.5 MG TABS    citalopram (CELEXA) 40 MG tablet  2. Insomnia due to medical condition  G47.01 doxepin (SINEQUAN) 10 MG capsule   pain  3. GAD (generalized anxiety disorder)  F41.1     Past Psychiatric History: I have reviewed past psychiatric history from my progress note on 03/08/2019  Past Medical History:  Past Medical History:  Diagnosis Date   Anxiety    Brachial neuritis    Chronic neck pain    Chronic pain not due to malignancy    Depressive disorder    followed by Dr. Alethia Berthold.   Disturbance, sleep    Elevated blood pressure reading without diagnosis of hypertension    Embolism and thrombosis of splenic artery    H/O thrombophlebitis    Infarction of spleen    Lumbosacral neuritis    Obesity, Class II, BMI 35-39.9, with comorbidity     Past Surgical History:  Procedure Laterality Date  CESAREAN SECTION  09/24/2010   TONSILLECTOMY AND ADENOIDECTOMY  09/24/2010   TUBAL LIGATION  09/24/2010    Family Psychiatric History: I have reviewed family psychiatric history from my progress note on 03/08/2019  Family History:  Family History  Problem Relation Age of Onset   Anxiety disorder Mother    Depression Mother    Alcohol abuse Brother     Social History: I have reviewed social history from my progress note on 03/08/2019 Social History   Socioeconomic History   Marital status: Married    Spouse name: rodney   Number of children:  3   Years of education: Not on file   Highest education level: Some college, no degree  Occupational History    Comment: full time  Tobacco Use   Smoking status: Never Smoker   Smokeless tobacco: Never Used  Vaping Use   Vaping Use: Never used  Substance and Sexual Activity   Alcohol use: Yes    Alcohol/week: 0.0 standard drinks    Comment: social   Drug use: Yes    Types: Marijuana    Comment: 6 mths ago   Sexual activity: Yes    Partners: Male    Birth control/protection: Surgical    Comment: Tubal ligation   Other Topics Concern   Not on file  Social History Narrative   Not on file   Social Determinants of Health   Financial Resource Strain: Not on file  Food Insecurity: Not on file  Transportation Needs: Not on file  Physical Activity: Not on file  Stress: Not on file  Social Connections: Not on file    Allergies:  Allergies  Allergen Reactions   Neuromuscular Blocking Agents    Hydrocodone-Acetaminophen Nausea Only    Metabolic Disorder Labs: No results found for: HGBA1C, MPG No results found for: PROLACTIN No results found for: CHOL, TRIG, HDL, CHOLHDL, VLDL, LDLCALC No results found for: TSH  Therapeutic Level Labs: No results found for: LITHIUM No results found for: VALPROATE No components found for:  CBMZ  Current Medications: Current Outpatient Medications  Medication Sig Dispense Refill   Brexpiprazole (REXULTI) 0.5 MG TABS Take 1 tablet (0.5 mg total) by mouth at bedtime. 30 tablet 1   acetaminophen (TYLENOL) 500 MG tablet Take by mouth.     citalopram (CELEXA) 40 MG tablet Take 1 tablet (40 mg total) by mouth daily. 30 tablet 1   cyclobenzaprine (FLEXERIL) 10 MG tablet Take 10 mg by mouth 3 (three) times daily.     cyclobenzaprine (FLEXERIL) 5 MG tablet Take 5 mg by mouth 3 (three) times daily as needed.     diazepam (VALIUM) 5 MG tablet Take by mouth.     doxepin (SINEQUAN) 10 MG capsule Take 1 capsule (10 mg total) by  mouth at bedtime. For sleep 30 capsule 1   estradiol (ESTRACE VAGINAL) 0.1 MG/GM vaginal cream Place 1 g vaginally 3 (three) times a week. 42.5 g 3   gabapentin (NEURONTIN) 800 MG tablet Take 1 tablet by mouth twice daily 60 tablet 0   ibuprofen (ADVIL,MOTRIN) 800 MG tablet Take 800 mg by mouth every 8 (eight) hours as needed.     meloxicam (MOBIC) 15 MG tablet Take 15 mg by mouth daily.     methocarbamol (ROBAXIN) 500 MG tablet Take 500 mg by mouth 2 (two) times daily.     methylPREDNISolone (MEDROL DOSEPAK) 4 MG TBPK tablet See admin instructions.     oxyCODONE-acetaminophen (PERCOCET/ROXICET) 5-325 MG tablet Take by mouth.  predniSONE (DELTASONE) 10 MG tablet 6 day taper - Take as directed     propranolol (INDERAL) 10 MG tablet TAKE 1 TO 2 TABLETS BY MOUTH ONCE DAILY AS NEEDED FOR BREAKTHROUGH ANXIETY ONLY 60 tablet 1   traMADol (ULTRAM) 50 MG tablet Take 50 mg by mouth every 8 (eight) hours as needed.     No current facility-administered medications for this visit.     Musculoskeletal: Strength & Muscle Tone: UTA Gait & Station: UTA Patient leans: N/A  Psychiatric Specialty Exam: Review of Systems  Musculoskeletal: Positive for back pain.       Leg pain - Rt.sided  Psychiatric/Behavioral: Positive for decreased concentration, dysphoric mood and sleep disturbance. The patient is nervous/anxious.   All other systems reviewed and are negative.   There were no vitals taken for this visit.There is no height or weight on file to calculate BMI.  General Appearance: UTA  Eye Contact:  UTA  Speech:  Normal Rate  Volume:  Normal  Mood:  Anxious, Depressed and Dysphoric  Affect:  UTA  Thought Process:  Goal Directed and Descriptions of Associations: Intact  Orientation:  Full (Time, Place, and Person)  Thought Content: Logical   Suicidal Thoughts:  No  Homicidal Thoughts:  No  Memory:  Immediate;   Fair Recent;   Fair Remote;   Fair  Judgement:  Fair  Insight:  Fair   Psychomotor Activity:  UTA  Concentration:  Concentration: Fair and Attention Span: Fair  Recall:  [employer] of Knowledge: Fair  Language: Fair  Akathisia:  No  Handed:  Right  AIMS (if indicated): UTA  Assets:  Communication Skills Desire for Improvement Housing Intimacy Social Support Talents/Skills Transportation Vocational/Educational  ADL's:  Intact  Cognition: WNL  Sleep:  Poor   Screenings: PHQ2-9   Flowsheet Row Video Visit from 02/23/2021 in [place] Video Visit from 02/09/2021 in [place] Office Visit from 03/15/2020 in [place] Office Visit from 10/11/2016 in [facility] Office Visit from 06/26/2016 in [facility]  PHQ-2 Total Score 6 2 6 2 1   PHQ-9 Total Score 16 8 17 8  --    Flowsheet Row Video Visit from 02/23/2021 in [place] Video Visit from 02/09/2021 in [place] No Risk No Risk       Assessment and Plan: Jamie [name] is a 55 year old Caucasian female who has a history of MDD, insomnia, chronic pain was evaluated by telemedicine today.  Patient is currently struggling with depressive symptoms, has been noncompliant with therapy as well as follow-up appointments.  Patient will benefit from the following plan.  Plan MDD-unstable Continue Celexa 40 mg p.o. daily Increase Rexulti to 0.5 mg p.o. nightly  Insomnia-unstable likely due to pain We will restart doxepin 10 mg p.o. nightly Patient will benefit from sufficient pain management and has continued follow-up with her provider.  GAD-some progress Celexa 40 mg p.o. daily Gabapentin 800 mg p.o. twice daily Patient to continue CBT.  Encouraged compliance  Noncompliance with recommendations-encourage compliance.  Discussed clinic policy.  I have reviewed EKG-dated November 15, 2020-normal  sinus rhythm, QTC-457.  Follow-up in clinic in 2 weeks in office.  I have spent atleast 20 minutes non face to face with patient today. More than 50 % of the time was spent for preparing to see the patient ( e.g., review of test, records ), ordering medications and test ,psychoeducation and  supportive psychotherapy and care coordination,as well as documenting clinical information in electronic health record,interpreting and communication of test results This note was generated in part or whole with voice recognition software. Voice recognition is usually quite accurate but there are transcription errors that can and very often do occur. I apologize for any typographical errors that were not detected and corrected.     Ursula Alert, MD 02/23/2021, 8:52 PM

## 2021-02-25 ENCOUNTER — Other Ambulatory Visit: Payer: Self-pay | Admitting: Psychiatry

## 2021-02-25 DIAGNOSIS — F331 Major depressive disorder, recurrent, moderate: Secondary | ICD-10-CM

## 2021-02-25 DIAGNOSIS — F411 Generalized anxiety disorder: Secondary | ICD-10-CM

## 2021-03-01 ENCOUNTER — Other Ambulatory Visit: Payer: Self-pay

## 2021-03-01 ENCOUNTER — Encounter: Payer: Self-pay | Admitting: Licensed Clinical Social Worker

## 2021-03-01 ENCOUNTER — Ambulatory Visit (INDEPENDENT_AMBULATORY_CARE_PROVIDER_SITE_OTHER): Payer: BC Managed Care – PPO | Admitting: Licensed Clinical Social Worker

## 2021-03-01 DIAGNOSIS — G4701 Insomnia due to medical condition: Secondary | ICD-10-CM | POA: Diagnosis not present

## 2021-03-01 DIAGNOSIS — F331 Major depressive disorder, recurrent, moderate: Secondary | ICD-10-CM

## 2021-03-01 DIAGNOSIS — F411 Generalized anxiety disorder: Secondary | ICD-10-CM

## 2021-03-01 DIAGNOSIS — Z91199 Patient's noncompliance with other medical treatment and regimen due to unspecified reason: Secondary | ICD-10-CM

## 2021-03-01 DIAGNOSIS — Z9111 Patient's noncompliance with dietary regimen: Secondary | ICD-10-CM | POA: Diagnosis not present
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
## 2021-03-07 ENCOUNTER — Other Ambulatory Visit: Payer: Self-pay | Admitting: Psychiatry

## 2021-03-07 DIAGNOSIS — F411 Generalized anxiety disorder: Secondary | ICD-10-CM

## 2021-03-09 ENCOUNTER — Telehealth: Payer: Self-pay

## 2021-03-09 NOTE — Telephone Encounter (Signed)
rexulti .5mg  approved from 03-06-21 to 03-05-22

## 2021-03-13 ENCOUNTER — Other Ambulatory Visit: Payer: Self-pay

## 2021-03-13 ENCOUNTER — Telehealth (INDEPENDENT_AMBULATORY_CARE_PROVIDER_SITE_OTHER): Payer: BC Managed Care – PPO | Admitting: Psychiatry

## 2021-03-13 ENCOUNTER — Encounter: Payer: Self-pay | Admitting: Psychiatry

## 2021-03-13 DIAGNOSIS — G4701 Insomnia due to medical condition: Secondary | ICD-10-CM

## 2021-03-13 DIAGNOSIS — F411 Generalized anxiety disorder: Secondary | ICD-10-CM | POA: Diagnosis not present

## 2021-03-13 DIAGNOSIS — F331 Major depressive disorder, recurrent, moderate: Secondary | ICD-10-CM | POA: Diagnosis not present

## 2021-03-13 NOTE — Progress Notes (Signed)
Virtual Visit via Video Note  I connected with Jamie [name] on 03/13/21 at  9:00 AM EDT by a video enabled telemedicine application and verified that I am speaking with the correct person using two identifiers.  Location Provider Location : [facility] Patient Location : Home  Participants: Patient , Provider   I discussed the limitations of evaluation and management by telemedicine and the availability of in person appointments. The patient expressed understanding and agreed to proceed.    I discussed the assessment and treatment plan with the patient. The patient was provided an opportunity to ask questions and all were answered. The patient agreed with the plan and demonstrated an understanding of the instructions.   The patient was advised to call back or seek an in-person evaluation if the symptoms worsen or if the condition fails to improve as anticipated.   [place] [facility] MD OP Progress Note  03/13/2021 9:24 AM Jamie [name]  MRN:  [id number]  Chief Complaint:  Chief Complaint    Follow-up; Depression     HPI: Jamie [name] is a 55 year old Caucasian female, married, lives in [place], employed, has a history of MDD, insomnia, GAD, chronic pain was evaluated by telemedicine today.  Patient today reports she is currently making some progress on the [place].  She started taking the higher dosage of Rexulti 3 days ago since she had to wait for it to be approved.  She reports she is able to function better during the day when it comes to her sadness and hopelessness.  She has noticed an improvement in that.  She continues to struggle with low energy during the day.  She however reports she struggles with chronic pain, hip joint pain since the past several months.  That does have an impact on her ability to function as well as her mood.  She recently got hip joint injection, likely cortisone last Monday.  She is waiting for it to work.  She currently rates her pain at an 8 out of 10, 10 being  the worst.  She reports if her pain does not respond to the cortisone injection her provider is planning to refer her to orthopedic specialist.  She reports sleep as improved.  She continues to struggle with her eating habits.  Since she is at work she does not have a good eating schedule.  She does not eat much during the day and then binges on food at the end of the day.  Patient denies any suicidality, homicidality or perceptual disturbances.  Patient was supposed to come into the office for an in person visit however since her child got sick she could not make it.  She is worried about her child who is currently struggling with possibly a stomach virus.  Patient denies any other concerns today.  Visit Diagnosis:    ICD-10-CM   1. MDD (major depressive disorder), recurrent episode, moderate (HCC)  F33.1   2. Insomnia due to medical condition  G47.01   3. GAD (generalized anxiety disorder)  F41.1     Past Psychiatric History: I have reviewed past psychiatric history from my progress note on 03/08/2019  Past Medical History:  Past Medical History:  Diagnosis Date  . Anxiety   . Brachial neuritis   . Chronic neck pain   . Chronic pain not due to malignancy   . Depressive disorder    followed by Dr. Alethia Berthold.  . Disturbance, sleep   . Elevated blood pressure reading without diagnosis of hypertension   .  Embolism and thrombosis of splenic artery   . H/O thrombophlebitis   . Infarction of spleen   . Lumbosacral neuritis   . Obesity, Class II, BMI 35-39.9, with comorbidity     Past Surgical History:  Procedure Laterality Date  . CESAREAN SECTION  09/24/2010  . TONSILLECTOMY AND ADENOIDECTOMY  09/24/2010  . TUBAL LIGATION  09/24/2010    Family Psychiatric History: I have reviewed family psychiatric history from my progress note on 03/08/2019  Family History:  Family History  Problem Relation Age of Onset  . Anxiety disorder Mother   . Depression Mother   . Alcohol abuse  Brother     Social History: I have reviewed social history from my progress note on 03/08/2019 Social History   Socioeconomic History  . Marital status: Married    Spouse name: rodney  . Number of children: 3  . Years of education: Not on file  . Highest education level: Some college, no degree  Occupational History    Comment: full time  Tobacco Use  . Smoking status: Never Smoker  . Smokeless tobacco: Never Used  Vaping Use  . Vaping Use: Never used  Substance and Sexual Activity  . Alcohol use: Yes    Alcohol/week: 0.0 standard drinks    Comment: social  . Drug use: Yes    Types: Marijuana    Comment: 6 mths ago  . Sexual activity: Yes    Partners: Male    Birth control/protection: Surgical    Comment: Tubal ligation   Other Topics Concern  . Not on file  Social History Narrative  . Not on file   Social Determinants of Health   Financial Resource Strain: Not on file  Food Insecurity: Not on file  Transportation Needs: Not on file  Physical Activity: Not on file  Stress: Not on file  Social Connections: Not on file    Allergies:  Allergies  Allergen Reactions  . Neuromuscular Blocking Agents   . Hydrocodone-Acetaminophen Nausea Only    Metabolic Disorder Labs: No results found for: HGBA1C, MPG No results found for: PROLACTIN No results found for: CHOL, TRIG, HDL, CHOLHDL, VLDL, LDLCALC No results found for: TSH  Therapeutic Level Labs: No results found for: LITHIUM No results found for: VALPROATE No components found for:  CBMZ  Current Medications: Current Outpatient Medications  Medication Sig Dispense Refill  . acetaminophen (TYLENOL) 500 MG tablet Take by mouth.    . Brexpiprazole (REXULTI) 0.5 MG TABS Take 1 tablet (0.5 mg total) by mouth at bedtime. 30 tablet 1  . citalopram (CELEXA) 40 MG tablet Take 1 tablet (40 mg total) by mouth daily. 30 tablet 1  . cyclobenzaprine (FLEXERIL) 10 MG tablet Take 10 mg by mouth 3 (three) times daily.    .  cyclobenzaprine (FLEXERIL) 5 MG tablet Take 5 mg by mouth 3 (three) times daily as needed.    . diazepam (VALIUM) 5 MG tablet Take by mouth.    . doxepin (SINEQUAN) 10 MG capsule Take 1 capsule (10 mg total) by mouth at bedtime. For sleep 30 capsule 1  . estradiol (ESTRACE VAGINAL) 0.1 MG/GM vaginal cream Place 1 g vaginally 3 (three) times a week. 42.5 g 3  . gabapentin (NEURONTIN) 800 MG tablet Take 1 tablet by mouth twice daily 60 tablet 0  . ibuprofen (ADVIL,MOTRIN) 800 MG tablet Take 800 mg by mouth every 8 (eight) hours as needed.    . meloxicam (MOBIC) 15 MG tablet Take 15 mg by mouth daily.    Marland Kitchen  methocarbamol (ROBAXIN) 500 MG tablet Take 500 mg by mouth 2 (two) times daily.    . methylPREDNISolone (MEDROL DOSEPAK) 4 MG TBPK tablet See admin instructions.    Marland Kitchen oxyCODONE-acetaminophen (PERCOCET/ROXICET) 5-325 MG tablet Take by mouth.    . predniSONE (DELTASONE) 10 MG tablet 6 day taper - Take as directed    . propranolol (INDERAL) 10 MG tablet TAKE 1 TO 2 TABLETS BY MOUTH ONCE DAILY AS NEEDED FOR  BREAKTHROUGH  ANXIETY  ONLY 60 tablet 0  . traMADol (ULTRAM) 50 MG tablet Take 50 mg by mouth every 8 (eight) hours as needed.     No current facility-administered medications for this visit.     Musculoskeletal: Strength & Muscle Tone: UTA Gait & Station: UTA Patient leans: N/A  Psychiatric Specialty Exam: Review of Systems  Constitutional: Positive for fatigue.  Musculoskeletal:       Hip joint pain  Psychiatric/Behavioral: Positive for dysphoric mood. The patient is nervous/anxious.   All other systems reviewed and are negative.   There were no vitals taken for this visit.There is no height or weight on file to calculate BMI.  General Appearance: Casual  Eye Contact:  Fair  Speech:  Clear and Coherent  Volume:  Normal  Mood:  Anxious and Depressed  Affect:  Congruent  Thought Process:  Goal Directed and Descriptions of Associations: Intact  Orientation:  Full (Time, Place,  and Person)  Thought Content: Logical   Suicidal Thoughts:  No  Homicidal Thoughts:  No  Memory:  Immediate;   Fair Recent;   Fair Remote;   Fair  Judgement:  Fair  Insight:  Fair  Psychomotor Activity:  Normal  Concentration:  Concentration: Fair and Attention Span: Fair  Recall:  [employer] of Knowledge: Fair  Language: Fair  Akathisia:  No  Handed:  Right  AIMS (if indicated): UTA  Assets:  Communication Skills Desire for Improvement Housing Social Support  ADL's:  Intact  Cognition: WNL  Sleep:  Fair   Screenings: PHQ2-9   Flowsheet Row Video Visit from 03/13/2021 in [facility] Video Visit from 02/23/2021 in [facility] Video Visit from 02/09/2021 in [facility] Office Visit from 03/15/2020 in [facility] Office Visit from 10/11/2016 in [facility]  PHQ-2 Total Score 3 6 2 6 2   PHQ-9 Total Score 10 16 8 17 8     Flowsheet Row Counselor from 03/01/2021 in [facility] Video Visit from 02/23/2021 in [facility] Video Visit from 02/09/2021 in [facility] No Risk No Risk No Risk       Assessment and Plan: Jamie [name] is a 55 year old Caucasian female who has a history of MDD, insomnia, chronic pain was evaluated by telemedicine today.  Patient is currently struggling with depressive symptoms although is making progress on the higher dosage of Rexulti.  Continue plan as noted below.  Plan MDD-some progress PHQ 9 today equals 10 Continue Celexa 40 mg p.o. daily Continue Rexulti 0.5 mg p.o. nightly.  She started taking the higher dosage 3 days ago.  We will give it more time.  Insomnia-improving Continue doxepin 10 mg p.o. nightly  GAD-improving Celexa 40 mg p.o. daily Gabapentin 800 mg p.o. twice daily Continue CBT.  Patient to  continue to follow-up with pain management provider as well as is awaiting referral to orthopedic specialist.  Follow-up in clinic in 2 weeks.  This note  was generated in part or whole with voice recognition software. Voice recognition is usually quite accurate but there are transcription errors that can and very often do occur. I apologize for any typographical errors that were not detected and corrected.        Ursula Alert, MD 03/14/2021, 8:24 AM

## 2021-04-02 ENCOUNTER — Encounter: Payer: Self-pay | Admitting: Psychiatry

## 2021-04-02 ENCOUNTER — Other Ambulatory Visit: Payer: Self-pay

## 2021-04-02 ENCOUNTER — Telehealth (INDEPENDENT_AMBULATORY_CARE_PROVIDER_SITE_OTHER): Payer: BC Managed Care – PPO | Admitting: Psychiatry

## 2021-04-02 DIAGNOSIS — F411 Generalized anxiety disorder: Secondary | ICD-10-CM

## 2021-04-02 DIAGNOSIS — F3342 Major depressive disorder, recurrent, in full remission: Secondary | ICD-10-CM | POA: Diagnosis not present

## 2021-04-02 DIAGNOSIS — G4701 Insomnia due to medical condition: Secondary | ICD-10-CM

## 2021-04-02 MED ORDER — REXULTI 0.5 MG PO TABS
0.5000 mg | ORAL_TABLET | Freq: Every day | ORAL | 1 refills | Status: DC
Start: 2021-04-02 — End: 2021-04-23

## 2021-04-02 NOTE — Progress Notes (Signed)
Virtual Visit via Video Note  I connected with Jamie [name] on 04/02/21 at  4:30 PM EDT by a video enabled telemedicine application and verified that I am speaking with the correct person using two identifiers.  Location Provider Location : [facility] Patient Location : Work  Participants: Patient , Provider   I discussed the limitations of evaluation and management by telemedicine and the availability of in person appointments. The patient expressed understanding and agreed to proceed.   I discussed the assessment and treatment plan with the patient. The patient was provided an opportunity to ask questions and all were answered. The patient agreed with the plan and demonstrated an understanding of the instructions.   The patient was advised to call back or seek an in-person evaluation if the symptoms worsen or if the condition fails to improve as anticipated.  [place] MD OP Progress Note  04/02/2021 4:50 PM Jamie [name]  MRN:  [id number]  Chief Complaint:  Chief Complaint    Follow-up; Depression     HPI: Jamie [name] is a 55 year old Caucasian female, married, lives in [place], employed, has a history of MDD, insomnia, GAD, chronic pain was evaluated by telemedicine today.  Patient today reports she is currently making progress with regards to her depression.  She reports that Rexulti does help.  Patient reports she may have received the 0.25 mg from the pharmacy.  However last visit she had reported she had started taking the 0.5 mg per review of notes dated 03/13/2021.  Patient reports overall she is doing okay with her mood.  She does struggle with pain.  She has arthritis of her hip joint, and rates her pain at a 10 out of 10 today.  She has appointment with Dr. Alba [name] tomorrow for a follow-up.  She reports she got injection to her hip which does not help much.  She reports she might be referred to orthopedic specialist soon.  She does feel anxious about her health, reports  coping okay.  Patient reports sleep is overall okay.  She reports she has made some changes with her eating habits and has been more organized with that  does help.  She was able to get some good snacks from the grocery store and has been using that.  Patient denies any suicidality, homicidality or perceptual disturbances.  Her hip joint pain does affect her ability to function at work.  She however reports her work has been very supportive and that seems to help.  Patient reports she is following up with her therapist however has to make an appointment and agrees to call back to schedule that.  Patient denies any other concerns today.  Visit Diagnosis:    ICD-10-CM   1. MDD (major depressive disorder), recurrent, in full remission ([place])  F33.42 Brexpiprazole (REXULTI) 0.5 MG TABS  2. Insomnia due to medical condition  G47.01   3. GAD (generalized anxiety disorder)  F41.1     Past Psychiatric History: I have reviewed past psychiatric history from my progress note on 03/08/2019.  Past Medical History:  Past Medical History:  Diagnosis Date  . Anxiety   . Brachial neuritis   . Chronic neck pain   . Chronic pain not due to malignancy   . Depressive disorder    followed by Dr. Alethia Berthold.  . Disturbance, sleep   . Elevated blood pressure reading without diagnosis of hypertension   . Embolism and thrombosis of splenic artery   . H/O thrombophlebitis   .  Infarction of spleen   . Lumbosacral neuritis   . Obesity, Class II, BMI 35-39.9, with comorbidity     Past Surgical History:  Procedure Laterality Date  . CESAREAN SECTION  09/24/2010  . TONSILLECTOMY AND ADENOIDECTOMY  09/24/2010  . TUBAL LIGATION  09/24/2010    Family Psychiatric History: I have reviewed family psychiatric history from my progress note on 03/08/2019.  Family History:  Family History  Problem Relation Age of Onset  . Anxiety disorder Mother   . Depression Mother   . Alcohol abuse Brother     Social  History: I have reviewed social history from my progress note on 03/08/2019. Social History   Socioeconomic History  . Marital status: Married    Spouse name: rodney  . Number of children: 3  . Years of education: Not on file  . Highest education level: Some college, no degree  Occupational History    Comment: full time  Tobacco Use  . Smoking status: Never Smoker  . Smokeless tobacco: Never Used  Vaping Use  . Vaping Use: Never used  Substance and Sexual Activity  . Alcohol use: Yes    Alcohol/week: 0.0 standard drinks    Comment: social  . Drug use: Yes    Types: Marijuana    Comment: 6 mths ago  . Sexual activity: Yes    Partners: Male    Birth control/protection: Surgical    Comment: Tubal ligation   Other Topics Concern  . Not on file  Social History Narrative  . Not on file   Social Determinants of Health   Financial Resource Strain: Not on file  Food Insecurity: Not on file  Transportation Needs: Not on file  Physical Activity: Not on file  Stress: Not on file  Social Connections: Not on file    Allergies:  Allergies  Allergen Reactions  . Neuromuscular Blocking Agents   . Hydrocodone-Acetaminophen Nausea Only    Metabolic Disorder Labs: No results found for: HGBA1C, MPG No results found for: PROLACTIN No results found for: CHOL, TRIG, HDL, CHOLHDL, VLDL, LDLCALC No results found for: TSH  Therapeutic Level Labs: No results found for: LITHIUM No results found for: VALPROATE No components found for:  CBMZ  Current Medications: Current Outpatient Medications  Medication Sig Dispense Refill  . acetaminophen (TYLENOL) 500 MG tablet Take by mouth.    . Brexpiprazole (REXULTI) 0.5 MG TABS Take 1 tablet (0.5 mg total) by mouth at bedtime. 30 tablet 1  . citalopram (CELEXA) 40 MG tablet Take 1 tablet (40 mg total) by mouth daily. 30 tablet 1  . cyclobenzaprine (FLEXERIL) 10 MG tablet Take 10 mg by mouth 3 (three) times daily.    . cyclobenzaprine  (FLEXERIL) 5 MG tablet Take 5 mg by mouth 3 (three) times daily as needed.    . diazepam (VALIUM) 5 MG tablet Take by mouth.    . doxepin (SINEQUAN) 10 MG capsule Take 1 capsule (10 mg total) by mouth at bedtime. For sleep 30 capsule 1  . estradiol (ESTRACE VAGINAL) 0.1 MG/GM vaginal cream Place 1 g vaginally 3 (three) times a week. 42.5 g 3  . gabapentin (NEURONTIN) 800 MG tablet Take 1 tablet by mouth twice daily 60 tablet 0  . ibuprofen (ADVIL,MOTRIN) 800 MG tablet Take 800 mg by mouth every 8 (eight) hours as needed.    . meloxicam (MOBIC) 15 MG tablet Take 15 mg by mouth daily.    . methocarbamol (ROBAXIN) 500 MG tablet Take 500 mg by mouth  2 (two) times daily.    . methylPREDNISolone (MEDROL DOSEPAK) 4 MG TBPK tablet See admin instructions.    Marland Kitchen oxyCODONE-acetaminophen (PERCOCET/ROXICET) 5-325 MG tablet Take by mouth.    . predniSONE (DELTASONE) 10 MG tablet 6 day taper - Take as directed    . propranolol (INDERAL) 10 MG tablet TAKE 1 TO 2 TABLETS BY MOUTH ONCE DAILY AS NEEDED FOR  BREAKTHROUGH  ANXIETY  ONLY 60 tablet 0  . traMADol (ULTRAM) 50 MG tablet Take 50 mg by mouth every 8 (eight) hours as needed.     No current facility-administered medications for this visit.     Musculoskeletal: Strength & Muscle Tone: UTA Gait & Station: UTA Patient leans: N/A  Psychiatric Specialty Exam: Review of Systems  Musculoskeletal: Positive for arthralgias.       Hip joint pain - rt sided  Psychiatric/Behavioral: The patient is nervous/anxious.   All other systems reviewed and are negative.   There were no vitals taken for this visit.There is no height or weight on file to calculate BMI.  General Appearance: Casual  Eye Contact:  Fair  Speech:  Clear and Coherent  Volume:  Normal  Mood:  Anxious Coping ok  Affect:  Congruent  Thought Process:  Goal Directed and Descriptions of Associations: Intact  Orientation:  Full (Time, Place, and Person)  Thought Content: Logical   Suicidal  Thoughts:  No  Homicidal Thoughts:  No  Memory:  Immediate;   Fair Recent;   Fair Remote;   Fair  Judgement:  Fair  Insight:  Fair  Psychomotor Activity:  Normal  Concentration:  Concentration: Fair and Attention Span: Fair  Recall:  [employer] of Knowledge: Fair  Language: Fair  Akathisia:  No  Handed:  Right  AIMS (if indicated): UTA  Assets:  Communication Skills Desire for Improvement Housing Intimacy Social Support Talents/Skills Transportation Vocational/Educational  ADL's:  Intact  Cognition: WNL  Sleep:  Fair   Screenings: PHQ2-9   Flowsheet Row Video Visit from 04/02/2021 in [facility] Video Visit from 03/13/2021 in [facility] Video Visit from 02/23/2021 in [facility] Video Visit from 02/09/2021 in [facility] Office Visit from 03/15/2020 in [facility]  PHQ-2 Total Score 1 3 6 2 6   PHQ-9 Total Score -- 10 16 8 17     Flowsheet Row Counselor from 03/01/2021 in [facility] Video Visit from 02/23/2021 in [facility] Video Visit from 02/09/2021 in [facility] No Risk No Risk No Risk       Assessment and Plan: Jamie [name] is a 55 year old Caucasian female who has a history of MDD, insomnia, chronic pain was evaluated by telemedicine today.  Patient is currently making progress with her depressive symptoms however she will continue to benefit from medication management and continued psychotherapy sessions.  Patient does need continued management of her chronic pain.  Plan MDD-in remission PHQ 2 today equals 1 Patient however does report her mood symptoms can be affected by pain. She will continue to need pain management as well as psychotherapy sessions.  Patient advised to follow-up with therapist as soon as  possible. Celexa 40 mg p.o. daily Continue Rexulti 0.5 mg p.o. nightly  Insomnia-improving Doxepin 10 mg p.o. nightly  GAD-improving Celexa 40 mg p.o. daily Gabapentin 800 mg p.o. twice daily Continue CBT   Follow-up in clinic in 1 month or sooner if needed.  This note was generated in part or whole with voice recognition software. Voice recognition is usually quite accurate but there are transcription errors that can and very often do occur. I apologize for any typographical errors that were not detected and corrected.     Ursula Alert, MD 04/03/2021, 12:59 PM

## 2021-04-21 ENCOUNTER — Other Ambulatory Visit: Payer: Self-pay | Admitting: Psychiatry

## 2021-04-21 DIAGNOSIS — F331 Major depressive disorder, recurrent, moderate: Secondary | ICD-10-CM

## 2021-04-21 DIAGNOSIS — F411 Generalized anxiety disorder: Secondary | ICD-10-CM

## 2021-04-23 ENCOUNTER — Encounter: Payer: Self-pay | Admitting: Psychiatry

## 2021-04-23 ENCOUNTER — Telehealth (INDEPENDENT_AMBULATORY_CARE_PROVIDER_SITE_OTHER): Payer: BC Managed Care – PPO | Admitting: Psychiatry

## 2021-04-23 ENCOUNTER — Other Ambulatory Visit: Payer: Self-pay

## 2021-04-23 DIAGNOSIS — F411 Generalized anxiety disorder: Secondary | ICD-10-CM | POA: Diagnosis not present

## 2021-04-23 DIAGNOSIS — F331 Major depressive disorder, recurrent, moderate: Secondary | ICD-10-CM

## 2021-04-23 DIAGNOSIS — G4701 Insomnia due to medical condition: Secondary | ICD-10-CM

## 2021-04-23 DIAGNOSIS — F3342 Major depressive disorder, recurrent, in full remission: Secondary | ICD-10-CM

## 2021-04-23 MED ORDER — DOXEPIN HCL 10 MG PO CAPS: 10.0000 mg | ORAL_CAPSULE | Freq: Every day | ORAL | 1 refills | Status: DC

## 2021-04-23 MED ORDER — CITALOPRAM HYDROBROMIDE 40 MG PO TABS: 40.0000 mg | ORAL_TABLET | Freq: Every day | ORAL | 1 refills | Status: DC

## 2021-04-23 MED ORDER — BREXPIPRAZOLE 1 MG PO TABS: 30.0000 mg | ORAL_TABLET | Freq: Every day | ORAL | 1 refills | Status: DC

## 2021-04-23 NOTE — Progress Notes (Signed)
Virtual Visit via Video Note  I connected with Jamie [name] on 04/23/21 at  9:20 AM EDT by a video enabled telemedicine application and verified that I am speaking with the correct person using two identifiers.  Location Provider Location : [facility] Patient Location : Home  Participants: Patient , Provider   I discussed the limitations of evaluation and management by telemedicine and the availability of in person appointments. The patient expressed understanding and agreed to proceed.    I discussed the assessment and treatment plan with the patient. The patient was provided an opportunity to ask questions and all were answered. The patient agreed with the plan and demonstrated an understanding of the instructions.   The patient was advised to call back or seek an in-person evaluation if the symptoms worsen or if the condition fails to improve as anticipated.   [facility] MD OP Progress Note  04/23/2021 9:30 AM Jamie [name]  MRN:  [id number]  Chief Complaint:  Chief Complaint    Follow-up; Anxiety     HPI: Jamie [name] is a 55 year old Caucasian female, married, lives in [place], employed, has a history of MDD, insomnia, GAD, chronic pain was evaluated by telemedicine today.  Patient today appeared to be tearful in session.  She reports she is not doing well emotionally.  Patient today reports she is struggling with pain especially of her right hip and was told that she needs right hip surgery.  She reports she can barely walk for 10 feet at that time due to her hip pain.  This does affect her ability to function at home and at work.  She reports she is constantly worried about what is going to happen to her since she has a lot of unknowns at this time and has no definite answers for anything.  She reports that her pain does have an impact on her sleep.  She does not know if her medications are effective or not since her pain makes her sleep restless.  She reports she does not have  any suicidality or homicidality.  She reports her husband is supportive on and off.  However they do have interpersonal relationship struggles.  Patient denies any other concerns today.  Visit Diagnosis:    ICD-10-CM   1. MDD (major depressive disorder), recurrent episode, moderate (HCC)  F33.1 brexpiprazole (REXULTI) 1 MG TABS tablet  2. Insomnia due to medical condition  G47.01 doxepin (SINEQUAN) 10 MG capsule   pain  3. GAD (generalized anxiety disorder)  F41.1 citalopram (CELEXA) 40 MG tablet    Past Psychiatric History: I have reviewed past psychiatric history from progress note on 03/08/2019  Past Medical History:  Past Medical History:  Diagnosis Date  . Anxiety   . Brachial neuritis   . Chronic neck pain   . Chronic pain not due to malignancy   . Depressive disorder    followed by Dr. Alethia Berthold.  . Disturbance, sleep   . Elevated blood pressure reading without diagnosis of hypertension   . Embolism and thrombosis of splenic artery   . H/O thrombophlebitis   . Infarction of spleen   . Lumbosacral neuritis   . Obesity, Class II, BMI 35-39.9, with comorbidity     Past Surgical History:  Procedure Laterality Date  . CESAREAN SECTION  09/24/2010  . TONSILLECTOMY AND ADENOIDECTOMY  09/24/2010  . TUBAL LIGATION  09/24/2010    Family Psychiatric History: I have reviewed family psychiatric history from progress note on 03/08/2019  Family History:  Family History  Problem Relation Age of Onset  . Anxiety disorder Mother   . Depression Mother   . Alcohol abuse Brother     Social History: Reviewed social history from progress note on 03/08/2019 Social History   Socioeconomic History  . Marital status: Married    Spouse name: rodney  . Number of children: 3  . Years of education: Not on file  . Highest education level: Some college, no degree  Occupational History    Comment: full time  Tobacco Use  . Smoking status: Never Smoker  . Smokeless tobacco: Never Used   Vaping Use  . Vaping Use: Never used  Substance and Sexual Activity  . Alcohol use: Yes    Alcohol/week: 0.0 standard drinks    Comment: social  . Drug use: Yes    Types: Marijuana    Comment: 6 mths ago  . Sexual activity: Yes    Partners: Male    Birth control/protection: Surgical    Comment: Tubal ligation   Other Topics Concern  . Not on file  Social History Narrative  . Not on file   Social Determinants of Health   Financial Resource Strain: Not on file  Food Insecurity: Not on file  Transportation Needs: Not on file  Physical Activity: Not on file  Stress: Not on file  Social Connections: Not on file    Allergies:  Allergies  Allergen Reactions  . Neuromuscular Blocking Agents   . Hydrocodone-Acetaminophen Nausea Only    Metabolic Disorder Labs: No results found for: HGBA1C, MPG No results found for: PROLACTIN No results found for: CHOL, TRIG, HDL, CHOLHDL, VLDL, LDLCALC No results found for: TSH  Therapeutic Level Labs: No results found for: LITHIUM No results found for: VALPROATE No components found for:  CBMZ  Current Medications: Current Outpatient Medications  Medication Sig Dispense Refill  . brexpiprazole (REXULTI) 1 MG TABS tablet Take 30 tablets (30 mg total) by mouth daily. 30 tablet 1  . celecoxib (CELEBREX) 200 MG capsule Take by mouth.    Marland Kitchen acetaminophen (TYLENOL) 500 MG tablet Take by mouth.    . citalopram (CELEXA) 40 MG tablet Take 1 tablet (40 mg total) by mouth daily. 30 tablet 1  . cyclobenzaprine (FLEXERIL) 10 MG tablet Take 10 mg by mouth 3 (three) times daily.    . cyclobenzaprine (FLEXERIL) 5 MG tablet Take 5 mg by mouth 3 (three) times daily as needed.    . diazepam (VALIUM) 5 MG tablet Take by mouth.    . doxepin (SINEQUAN) 10 MG capsule Take 1 capsule (10 mg total) by mouth at bedtime. For sleep 30 capsule 1  . estradiol (ESTRACE VAGINAL) 0.1 MG/GM vaginal cream Place 1 g vaginally 3 (three) times a week. 42.5 g 3  .  gabapentin (NEURONTIN) 800 MG tablet Take 1 tablet by mouth twice daily 60 tablet 0  . ibuprofen (ADVIL,MOTRIN) 800 MG tablet Take 800 mg by mouth every 8 (eight) hours as needed.    . meloxicam (MOBIC) 15 MG tablet Take 15 mg by mouth daily.    . methocarbamol (ROBAXIN) 500 MG tablet Take 500 mg by mouth 2 (two) times daily.    . methylPREDNISolone (MEDROL DOSEPAK) 4 MG TBPK tablet See admin instructions.    Marland Kitchen oxyCODONE-acetaminophen (PERCOCET/ROXICET) 5-325 MG tablet Take by mouth.    . predniSONE (DELTASONE) 10 MG tablet 6 day taper - Take as directed    . propranolol (INDERAL) 10 MG tablet TAKE 1 TO 2 TABLETS BY  MOUTH ONCE DAILY AS NEEDED FOR  BREAKTHROUGH  ANXIETY  ONLY 60 tablet 0  . traMADol (ULTRAM) 50 MG tablet Take 50 mg by mouth every 8 (eight) hours as needed.     No current facility-administered medications for this visit.     Musculoskeletal: Strength & Muscle Tone: UTA Gait & Station: UTA Patient leans: N/A  Psychiatric Specialty Exam: Review of Systems  Constitutional: Positive for fatigue.  Musculoskeletal:       Hip pain  Psychiatric/Behavioral: Positive for dysphoric mood and sleep disturbance. The patient is nervous/anxious.   All other systems reviewed and are negative.   There were no vitals taken for this visit.There is no height or weight on file to calculate BMI.  General Appearance: Casual  Eye Contact:  Fair  Speech:  Clear and Coherent  Volume:  Normal  Mood:  Anxious and Depressed  Affect:  Tearful  Thought Process:  Goal Directed and Descriptions of Associations: Intact  Orientation:  Full (Time, Place, and Person)  Thought Content: Logical   Suicidal Thoughts:  No  Homicidal Thoughts:  No  Memory:  Immediate;   Fair Recent;   Fair Remote;   Fair  Judgement:  Fair  Insight:  Fair  Psychomotor Activity:  Normal  Concentration:  Concentration: Fair and Attention Span: Fair  Recall:  [employer] of Knowledge: Fair  Language: Fair  Akathisia:   No  Handed:  Right  AIMS (if indicated): UTA  Assets:  Communication Skills Desire for Improvement Housing Social Support  ADL's:  Intact  Cognition: WNL  Sleep:  Poor   Screenings: PHQ2-9   Flowsheet Row Video Visit from 04/02/2021 in [facility] Video Visit from 03/13/2021 in [facility] Video Visit from 02/23/2021 in [facility] Video Visit from 02/09/2021 in [facility] Office Visit from 03/15/2020 in [facility]  PHQ-2 Total Score 1 3 6 2 6   PHQ-9 Total Score -- 10 16 8 17     Flowsheet Row Counselor from 03/01/2021 in [facility] Video Visit from 02/23/2021 in [facility] Video Visit from 02/09/2021 in [facility] No Risk No Risk No Risk       Assessment and Plan: AVIGAIL PILLING is a 55 year old Caucasian female who has a history of MDD, insomnia, chronic pain was evaluated by telemedicine today.  Patient is currently struggling with depression, anxiety symptoms.  She does struggle with pain which does have an impact on her mood as well as day-to-day functioning.  Discussed plan as noted below.  Plan MDD-unstable Continue Celexa 40 mg p.o. daily Increase Rexulti to 1 mg p.o. nightly  Insomnia- unstable Discussed to continue doxepin for now. Rexulti may also help. However she will manage sufficient pain management since pain is affecting her sleep.  GAD-unstable Celexa 40 mg p.o. daily Gabapentin as prescribed Patient encouraged to continue CBT.  Provided community resources.  However I have communicated with staff here who reported that patient does have upcoming appointment with Jeanmarie Plant since her therapist is leaving the practice. Also discussed referral for intensive outpatient program if she continues to  decompensate.  Follow-up in clinic in 3 weeks or sooner if needed.  This note was generated in part or whole with voice recognition software. Voice recognition is usually quite accurate but there are transcription errors that can and very often do occur. I apologize for any typographical errors that were  not detected and corrected.      Ursula Alert, MD 04/24/2021, 6:28 PM

## 2021-04-24 ENCOUNTER — Ambulatory Visit (INDEPENDENT_AMBULATORY_CARE_PROVIDER_SITE_OTHER): Payer: BC Managed Care – PPO | Admitting: Licensed Clinical Social Worker

## 2021-04-24 DIAGNOSIS — F331 Major depressive disorder, recurrent, moderate: Secondary | ICD-10-CM | POA: Diagnosis not present

## 2021-04-24 DIAGNOSIS — F411 Generalized anxiety disorder: Secondary | ICD-10-CM
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
## 2021-05-01 ENCOUNTER — Other Ambulatory Visit: Payer: Self-pay | Admitting: Orthopedic Surgery

## 2021-05-03 ENCOUNTER — Other Ambulatory Visit: Payer: Self-pay

## 2021-05-03 ENCOUNTER — Encounter: Payer: Self-pay | Admitting: Psychiatry

## 2021-05-03 ENCOUNTER — Telehealth (INDEPENDENT_AMBULATORY_CARE_PROVIDER_SITE_OTHER): Payer: BC Managed Care – PPO | Admitting: Psychiatry

## 2021-05-03 DIAGNOSIS — F411 Generalized anxiety disorder: Secondary | ICD-10-CM | POA: Diagnosis not present

## 2021-05-03 DIAGNOSIS — F331 Major depressive disorder, recurrent, moderate: Secondary | ICD-10-CM | POA: Diagnosis not present

## 2021-05-03 DIAGNOSIS — G4701 Insomnia due to medical condition: Secondary | ICD-10-CM

## 2021-05-03 MED ORDER — AMITRIPTYLINE HCL 10 MG PO TABS: 10.0000 mg | ORAL_TABLET | Freq: Every evening | ORAL | 1 refills | Status: DC | PRN

## 2021-05-03 NOTE — Patient Instructions (Signed)
Amitriptyline tablets What is this medicine? AMITRIPTYLINE (a mee TRIP ti leen) is used to treat depression. This medicine may be used for other purposes; ask your health care provider or pharmacist if you have questions. COMMON BRAND NAME(S): Elavil, Vanatrip What should I tell my health care provider before I take this medicine? They need to know if you have any of these conditions:  an alcohol problem  asthma, difficulty breathing  bipolar disorder or schizophrenia  difficulty passing urine, prostate trouble  glaucoma  heart disease or previous heart attack  liver disease  over active thyroid  seizures  thoughts or plans of suicide, a previous suicide attempt, or family history of suicide attempt  an unusual or allergic reaction to amitriptyline, other medicines, foods, dyes, or preservatives  pregnant or trying to get pregnant  breast-feeding How should I use this medicine? Take this medicine by mouth with a drink of water. Follow the directions on the prescription label. You can take the tablets with or without food. Take your medicine at regular intervals. Do not take it more often than directed. Do not stop taking this medicine suddenly except upon the advice of your doctor. Stopping this medicine too quickly may cause serious side effects or your condition may worsen. A special MedGuide will be given to you by the pharmacist with each prescription and refill. Be sure to read this information carefully each time. Talk to your pediatrician regarding the use of this medicine in children. Special care may be needed. Overdosage: If you think you have taken too much of this medicine contact a poison control center or emergency room at once. NOTE: This medicine is only for you. Do not share this medicine with others. What if I miss a dose? If you miss a dose, take it as soon as you can. If it is almost time for your next dose, take only that dose. Do not take double or extra  doses. What may interact with this medicine? Do not take this medicine with any of the following medications:  arsenic trioxide  certain medicines used to regulate abnormal heartbeat or to treat other heart conditions  cisapride  droperidol  halofantrine  linezolid  MAOIs like Carbex, Eldepryl, Marplan, Nardil, and Parnate  methylene blue  other medicines for mental depression  phenothiazines like perphenazine, thioridazine and chlorpromazine  pimozide  probucol  procarbazine  sparfloxacin  St. John's Wort This medicine may also interact with the following medications:  atropine and related drugs like hyoscyamine, scopolamine, tolterodine and others  barbiturate medicines for inducing sleep or treating seizures, like phenobarbital  cimetidine  disulfiram  ethchlorvynol  thyroid hormones such as levothyroxine  ziprasidone This list may not describe all possible interactions. Give your health care provider a list of all the medicines, herbs, non-prescription drugs, or dietary supplements you use. Also tell them if you smoke, drink alcohol, or use illegal drugs. Some items may interact with your medicine. What should I watch for while using this medicine? Tell your doctor if your symptoms do not get better or if they get worse. Visit your doctor or health care professional for regular checks on your progress. Because it may take several weeks to see the full effects of this medicine, it is important to continue your treatment as prescribed by your doctor. Patients and their families should watch out for new or worsening thoughts of suicide or depression. Also watch out for sudden changes in feelings such as feeling anxious, agitated, panicky, irritable, hostile, aggressive, impulsive,   severely restless, overly excited and hyperactive, or not being able to sleep. If this happens, especially at the beginning of treatment or after a change in dose, call your health care  professional. Dennis Bast may get drowsy or dizzy. Do not drive, use machinery, or do anything that needs mental alertness until you know how this medicine affects you. Do not stand or sit up quickly, especially if you are an older patient. This reduces the risk of dizzy or fainting spells. Alcohol may interfere with the effect of this medicine. Avoid alcoholic drinks. Do not treat yourself for coughs, colds, or allergies without asking your doctor or health care professional for advice. Some ingredients can increase possible side effects. Your mouth may get dry. Chewing sugarless gum or sucking hard candy, and drinking plenty of water will help. Contact your doctor if the problem does not go away or is severe. This medicine may cause dry eyes and blurred vision. If you wear contact lenses you may feel some discomfort. Lubricating drops may help. See your eye doctor if the problem does not go away or is severe. This medicine can cause constipation. Try to have a bowel movement at least every 2 to 3 days. If you do not have a bowel movement for 3 days, call your doctor or health care professional. This medicine can make you more sensitive to the sun. Keep out of the sun. If you cannot avoid being in the sun, wear protective clothing and use sunscreen. Do not use sun lamps or tanning beds/booths. What side effects may I notice from receiving this medicine? Side effects that you should report to your doctor or health care professional as soon as possible:  allergic reactions like skin rash, itching or hives, swelling of the face, lips, or tongue  anxious  breathing problems  changes in vision  confusion  elevated mood, decreased need for sleep, racing thoughts, impulsive behavior  eye pain  fast, irregular heartbeat  feeling faint or lightheaded, falls  feeling agitated, angry, or irritable  fever with increased sweating  hallucination, loss of contact with reality  seizures  stiff  muscles  suicidal thoughts or other mood changes  tingling, pain, or numbness in the feet or hands  trouble passing urine or change in the amount of urine  trouble sleeping  unusually weak or tired  vomiting  yellowing of the eyes or skin Side effects that usually do not require medical attention (report to your doctor or health care professional if they continue or are bothersome):  change in sex drive or performance  change in appetite or weight  constipation  dizziness  dry mouth  nausea  tired  tremors  upset stomach This list may not describe all possible side effects. Call your doctor for medical advice about side effects. You may report side effects to FDA at 1-800-FDA-1088. Where should I keep my medicine? Keep out of the reach of children. Store at room temperature between 20 and 25 degrees C (68 and 77 degrees F). Throw away any unused medicine after the expiration date. NOTE: This sheet is a summary. It may not cover all possible information. If you have questions about this medicine, talk to your doctor, pharmacist, or health care provider.  2021 Elsevier/Gold Standard (2018-12-08 13:04:32)

## 2021-05-03 NOTE — Progress Notes (Signed)
Virtual Visit via Video Note  I connected with Jamie [name] on 05/03/21 at 10:00 AM EDT by a video enabled telemedicine application and verified that I am speaking with the correct person using two identifiers.  Location Provider Location : [facility] Patient Location : Home  Participants: Patient , Provider   I discussed the limitations of evaluation and management by telemedicine and the availability of in person appointments. The patient expressed understanding and agreed to proceed.    I discussed the assessment and treatment plan with the patient. The patient was provided an opportunity to ask questions and all were answered. The patient agreed with the plan and demonstrated an understanding of the instructions.   The patient was advised to call back or seek an in-person evaluation if the symptoms worsen or if the condition fails to improve as anticipated.   [facility] MD OP Progress Note  05/03/2021 2:34 PM Jamie [name]  MRN:  [id number]  Chief Complaint:  Chief Complaint    Follow-up; Anxiety; Depression     HPI: Jamie [name] is a 55 year old Caucasian female, married, lives in [place], employed, has a history of MDD, insomnia, GAD, chronic pain was evaluated by telemedicine today.  Patient today reports she continues to struggle with pain.  The pain does have an impact on the way she functions at work.  She is unable to walk more than 10 feet at a time.  She reports she has upcoming surgery scheduled for May 31.  Patient reports she was also advised by her providers to have work restrictions and that also helps to some extent.  She however continues to work 30 hours/week since without that she would not be able to get any benefits.  That is stressful for her.  She currently rates her pain at an 8 out of 10, 10 being the worst.  She continues to follow-up with her providers for pain management.  She reports sleep continues to be restless.  The doxepin does not help.  Patient  reports increasing the Rexulti does have an impact on her mood and she believes her mood swings have improved.  Denies side effects.  Patient reports she was able to have psychotherapy session with Ms. Jamie [name] and has upcoming appointment scheduled.  That has been beneficial.  She denies any suicidality, homicidality or perceptual disturbances.  Patient denies any other concerns today.  Visit Diagnosis:    ICD-10-CM   1. MDD (major depressive disorder), recurrent episode, moderate (HCC)  F33.1   2. Insomnia due to medical condition  G47.01 amitriptyline (ELAVIL) 10 MG tablet  3. GAD (generalized anxiety disorder)  F41.1     Past Psychiatric History: I have reviewed past psychiatric history from progress note on 03/08/2019  Past Medical History:  Past Medical History:  Diagnosis Date  . Anxiety   . Brachial neuritis   . Chronic neck pain   . Chronic pain not due to malignancy   . Depressive disorder    followed by Dr. Alethia [name].  . Disturbance, sleep   . Elevated blood pressure reading without diagnosis of hypertension   . Embolism and thrombosis of splenic artery   . H/O thrombophlebitis   . Infarction of spleen   . Lumbosacral neuritis   . Obesity, Class II, BMI 35-39.9, with comorbidity     Past Surgical History:  Procedure Laterality Date  . CESAREAN SECTION  09/24/2010  . TONSILLECTOMY AND ADENOIDECTOMY  09/24/2010  . TUBAL LIGATION  09/24/2010  Family Psychiatric History: I have reviewed family psychiatric history from progress note on 03/08/2019  Family History:  Family History  Problem Relation Age of Onset  . Anxiety disorder Mother   . Depression Mother   . Alcohol abuse Brother     Social History: Reviewed social history from progress note on 03/08/2019 Social History   Socioeconomic History  . Marital status: Married    Spouse name: Jamie [name]  . Number of children: 3  . Years of education: Not on file  . Highest education level: Some  college, no degree  Occupational History    Comment: full time  Tobacco Use  . Smoking status: Never Smoker  . Smokeless tobacco: Never Used  Vaping Use  . Vaping Use: Never used  Substance and Sexual Activity  . Alcohol use: Yes    Alcohol/week: 0.0 standard drinks    Comment: social  . Drug use: Yes    Types: Marijuana    Comment: 6 mths ago  . Sexual activity: Yes    Partners: Male    Birth control/protection: Surgical    Comment: Tubal ligation   Other Topics Concern  . Not on file  Social History Narrative  . Not on file   Social Determinants of Health   Financial Resource Strain: Not on file  Food Insecurity: Not on file  Transportation Needs: Not on file  Physical Activity: Not on file  Stress: Not on file  Social Connections: Not on file    Allergies:  Allergies  Allergen Reactions  . Neuromuscular Blocking Agents   . Hydrocodone-Acetaminophen Nausea Only    Metabolic Disorder Labs: No results found for: HGBA1C, MPG No results found for: PROLACTIN No results found for: CHOL, TRIG, HDL, CHOLHDL, VLDL, LDLCALC No results found for: TSH  Therapeutic Level Labs: No results found for: LITHIUM No results found for: VALPROATE No components found for:  CBMZ  Current Medications: Current Outpatient Medications  Medication Sig Dispense Refill  . amitriptyline (ELAVIL) 10 MG tablet Take 1-2 tablets (10-20 mg total) by mouth at bedtime as needed for sleep. 60 tablet 1  . acetaminophen (TYLENOL) 500 MG tablet Take by mouth.    . brexpiprazole (REXULTI) 1 MG TABS tablet Take 30 tablets (30 mg total) by mouth daily. 30 tablet 1  . celecoxib (CELEBREX) 200 MG capsule Take by mouth.    . citalopram (CELEXA) 40 MG tablet Take 1 tablet (40 mg total) by mouth daily. 30 tablet 1  . cyclobenzaprine (FLEXERIL) 10 MG tablet Take 10 mg by mouth 3 (three) times daily.    . cyclobenzaprine (FLEXERIL) 5 MG tablet Take 5 mg by mouth 3 (three) times daily as needed.    .  diazepam (VALIUM) 5 MG tablet Take by mouth.    . estradiol (ESTRACE VAGINAL) 0.1 MG/GM vaginal cream Place 1 g vaginally 3 (three) times a week. 42.5 g 3  . gabapentin (NEURONTIN) 800 MG tablet Take 1 tablet by mouth twice daily 60 tablet 0  . ibuprofen (ADVIL,MOTRIN) 800 MG tablet Take 800 mg by mouth every 8 (eight) hours as needed.    . meloxicam (MOBIC) 15 MG tablet Take 15 mg by mouth daily.    . methocarbamol (ROBAXIN) 500 MG tablet Take 500 mg by mouth 2 (two) times daily.    . methylPREDNISolone (MEDROL DOSEPAK) 4 MG TBPK tablet See admin instructions.    Marland Kitchen oxyCODONE-acetaminophen (PERCOCET/ROXICET) 5-325 MG tablet Take by mouth.    . predniSONE (DELTASONE) 10 MG tablet 6 day  taper - Take as directed    . propranolol (INDERAL) 10 MG tablet TAKE 1 TO 2 TABLETS BY MOUTH ONCE DAILY AS NEEDED FOR  BREAKTHROUGH  ANXIETY  ONLY 60 tablet 0  . traMADol (ULTRAM) 50 MG tablet Take 50 mg by mouth every 8 (eight) hours as needed.     No current facility-administered medications for this visit.     Musculoskeletal: Strength & Muscle Tone: UTA Gait & Station: UTA Patient leans: N/A  Psychiatric Specialty Exam: Review of Systems  Musculoskeletal:       Hip joint pain   Psychiatric/Behavioral: Positive for dysphoric mood and sleep disturbance. The patient is nervous/anxious.   All other systems reviewed and are negative.   There were no vitals taken for this visit.There is no height or weight on file to calculate BMI.  General Appearance: Casual  Eye Contact:  Fair  Speech:  Clear and Coherent  Volume:  Normal  Mood:  Anxious and Depressed  Affect:  Congruent  Thought Process:  Goal Directed and Descriptions of Associations: Intact  Orientation:  Full (Time, Place, and Person)  Thought Content: Logical   Suicidal Thoughts:  No  Homicidal Thoughts:  No  Memory:  Immediate;   Fair Recent;   Fair Remote;   Fair  Judgement:  Fair  Insight:  Fair  Psychomotor Activity:  Normal   Concentration:  Concentration: Fair and Attention Span: Fair  Recall:  [employer] of Knowledge: Fair  Language: Fair  Akathisia:  No  Handed:  Right  AIMS (if indicated): UTA  Assets:  Communication Skills Desire for Improvement Housing Social Support  ADL's:  Intact  Cognition: WNL  Sleep:  Poor   Screenings: PHQ2-9   Flowsheet Row Video Visit from 05/03/2021 in [facility] Video Visit from 04/02/2021 in [facility] Video Visit from 03/13/2021 in [facility] Video Visit from 02/23/2021 in [facility] Video Visit from 02/09/2021 in [facility]  PHQ-2 Total Score 5 1 3 6 2   PHQ-9 Total Score 12 -- 10 16 8     Flowsheet Row Video Visit from 05/03/2021 in [facility] from 04/24/2021 in [facility] from 03/01/2021 in [facility] Low Risk No Risk No Risk       Assessment and Plan: Jamie [name] is a 55 year old Caucasian female who has a history of MDD, insomnia, chronic pain was evaluated by telemedicine today.  Patient is currently struggling with pain and has upcoming surgery scheduled.  She does continue to have depression and anxiety and sleep problems however does report some progress with her mood on the current medication regimen.  She will continue to benefit from psychotherapy sessions.  Plan as noted below.  Plan MDD- some improvement Continue Celexa 40 mg p.o. daily Rexulti 1 mg p.o. nightly Continue CBT with Ms. Jamie [name]  Insomnia-unstable Discontinue doxepin for lack of benefit Start Elavil 10 to 20 mg p.o.nightly as needed Discussed drug to drug interaction including serotonin syndrome.  Provided medication education  GAD- some improvement Celexa 40 mg p.o. daily Gabapentin as prescribed Patient  encouraged to continue CBT.  Patient is scheduled for total hip arthroplasty on May 29, 2021.  She will continue to follow-up with her providers for her pain.  Follow-up in clinic in 2 weeks or sooner if needed.  This note was generated in part or whole with voice recognition software.  Voice recognition is usually quite accurate but there are transcription errors that can and very often do occur. I apologize for any typographical errors that were not detected and corrected.        Ursula Alert, MD 05/04/2021, 8:27 AM

## 2021-05-15 ENCOUNTER — Ambulatory Visit: Payer: BC Managed Care – PPO | Admitting: Licensed Clinical Social Worker

## 2021-05-16 ENCOUNTER — Telehealth (INDEPENDENT_AMBULATORY_CARE_PROVIDER_SITE_OTHER): Payer: Self-pay

## 2021-05-17 ENCOUNTER — Other Ambulatory Visit: Payer: Self-pay

## 2021-05-17 ENCOUNTER — Encounter: Payer: Self-pay | Admitting: Psychiatry

## 2021-05-17 ENCOUNTER — Telehealth (INDEPENDENT_AMBULATORY_CARE_PROVIDER_SITE_OTHER): Payer: BC Managed Care – PPO | Admitting: Psychiatry

## 2021-05-17 DIAGNOSIS — F411 Generalized anxiety disorder: Secondary | ICD-10-CM | POA: Diagnosis not present

## 2021-05-17 DIAGNOSIS — G4701 Insomnia due to medical condition: Secondary | ICD-10-CM

## 2021-05-17 DIAGNOSIS — F331 Major depressive disorder, recurrent, moderate: Secondary | ICD-10-CM

## 2021-05-17 MED ORDER — PROPRANOLOL HCL 10 MG PO TABS: ORAL_TABLET | ORAL | 0 refills | Status: DC

## 2021-05-17 NOTE — Progress Notes (Signed)
Virtual Visit via Video Note  I connected with Jamie [name] on 05/17/21 at  9:40 AM EDT by a video enabled telemedicine application and verified that I am speaking with the correct person using two identifiers.  Location Provider Location : [facility] Patient Location : Home  Participants: Patient , Provider   I discussed the limitations of evaluation and management by telemedicine and the availability of in person appointments. The patient expressed understanding and agreed to proceed.   I discussed the assessment and treatment plan with the patient. The patient was provided an opportunity to ask questions and all were answered. The patient agreed with the plan and demonstrated an understanding of the instructions.   The patient was advised to call back or seek an in-person evaluation if the symptoms worsen or if the condition fails to improve as anticipated.   [facility] MD OP Progress Note  05/17/2021 10:00 AM Jamie [name]  MRN:  [id number]  Chief Complaint:  Chief Complaint    Follow-up; Anxiety; Depression     HPI: Jamie [name] is a 55 year old Caucasian female, married, lives in [place], employed, has a history of MDD, insomnia, GAD, chronic pain was evaluated by telemedicine today.  Patient today reports she was taken out of work on May 17.  She has upcoming back surgery on May 31.  She will be out of work at least until July 5.  She reports she is in severe pain and does have problems functioning during the day.  She is currently worried about everything.  She is worried about her finances.  She worries about her surgery.  She is worried about not being able to take care of her household chores since she has difficulty getting up and functioning.  Patient reports the Elavil does help her when she takes it at night.  She however reports because of the pain her sleep continues to be restless.  She currently gets 4 hours of sleep at night which is an improvement.  She does take  naps during the day and reports whenever she does that she does not take the Elavil at night since she feels she does not need  more sleep most nights.  She reports she takes Seroquel only 2-3 times a week.  Patient reports she missed her last appointment with her therapist since she had another appointment with social services.  She agrees to get in touch with her therapist to schedule another appointment.  Patient denies any suicidality, homicidality or perceptual disturbances.  Patient denies any other concerns today.   Visit Diagnosis:    ICD-10-CM   1. MDD (major depressive disorder), recurrent episode, moderate (HCC)  F33.1 propranolol (INDERAL) 10 MG tablet  2. Insomnia due to medical condition  G47.01    Pain  3. GAD (generalized anxiety disorder)  F41.1 propranolol (INDERAL) 10 MG tablet    Past Psychiatric History: I have reviewed past psychiatric history from progress note on 03/08/2019  Past Medical History:  Past Medical History:  Diagnosis Date  . Anxiety   . Brachial neuritis   . Chronic neck pain   . Chronic pain not due to malignancy   . Depressive disorder    followed by Dr. Alethia Berthold.  . Disturbance, sleep   . Elevated blood pressure reading without diagnosis of hypertension   . Embolism and thrombosis of splenic artery   . H/O thrombophlebitis   . Infarction of spleen   . Lumbosacral neuritis   . Obesity, Class II, BMI  35-39.9, with comorbidity     Past Surgical History:  Procedure Laterality Date  . CESAREAN SECTION  09/24/2010  . TONSILLECTOMY AND ADENOIDECTOMY  09/24/2010  . TUBAL LIGATION  09/24/2010    Family Psychiatric History: I have reviewed family psychiatric history from progress note on 03/08/2019  Family History:  Family History  Problem Relation Age of Onset  . Anxiety disorder Mother   . Depression Mother   . Alcohol abuse Brother     Social History: Reviewed social history from progress note on 03/08/2019 Social History    Socioeconomic History  . Marital status: Married    Spouse name: rodney  . Number of children: 3  . Years of education: Not on file  . Highest education level: Some college, no degree  Occupational History    Comment: full time  Tobacco Use  . Smoking status: Never Smoker  . Smokeless tobacco: Never Used  Vaping Use  . Vaping Use: Never used  Substance and Sexual Activity  . Alcohol use: Yes    Alcohol/week: 0.0 standard drinks    Comment: social  . Drug use: Yes    Types: Marijuana    Comment: 6 mths ago  . Sexual activity: Yes    Partners: Male    Birth control/protection: Surgical    Comment: Tubal ligation   Other Topics Concern  . Not on file  Social History Narrative  . Not on file   Social Determinants of Health   Financial Resource Strain: Not on file  Food Insecurity: Not on file  Transportation Needs: Not on file  Physical Activity: Not on file  Stress: Not on file  Social Connections: Not on file    Allergies:  Allergies  Allergen Reactions  . Neuromuscular Blocking Agents Other (See Comments)    Don't work  . Hydrocodone-Acetaminophen Nausea Only    Metabolic Disorder Labs: No results found for: HGBA1C, MPG No results found for: PROLACTIN No results found for: CHOL, TRIG, HDL, CHOLHDL, VLDL, LDLCALC No results found for: TSH  Therapeutic Level Labs: No results found for: LITHIUM No results found for: VALPROATE No components found for:  CBMZ  Current Medications: Current Outpatient Medications  Medication Sig Dispense Refill  . amitriptyline (ELAVIL) 10 MG tablet Take 1-2 tablets (10-20 mg total) by mouth at bedtime as needed for sleep. 60 tablet 1  . brexpiprazole (REXULTI) 1 MG TABS tablet Take 30 tablets (30 mg total) by mouth daily. (Patient taking differently: Take 1 mg by mouth at bedtime.) 30 tablet 1  . celecoxib (CELEBREX) 200 MG capsule Take 200 mg by mouth daily as needed for mild pain or moderate pain.    . citalopram (CELEXA)  40 MG tablet Take 1 tablet (40 mg total) by mouth daily. 30 tablet 1  . gabapentin (NEURONTIN) 800 MG tablet Take 1 tablet by mouth twice daily (Patient taking differently: Take 800 mg by mouth 2 (two) times daily.) 60 tablet 0  . propranolol (INDERAL) 10 MG tablet Take 1 tablet twice a day as needed for breakthrough anxiety 60 tablet 0  . traMADol (ULTRAM) 50 MG tablet Take 100 mg by mouth every 8 (eight) hours as needed for moderate pain or severe pain.     No current facility-administered medications for this visit.     Musculoskeletal: Strength & Muscle Tone: UTA Gait & Station: UTA Patient leans: N/A  Psychiatric Specialty Exam: Review of Systems  Constitutional: Positive for fatigue.  Musculoskeletal: Positive for back pain.  Psychiatric/Behavioral: Positive for  dysphoric mood and sleep disturbance. The patient is nervous/anxious.   All other systems reviewed and are negative.   There were no vitals taken for this visit.There is no height or weight on file to calculate BMI.  General Appearance: Casual  Eye Contact:  Fair  Speech:  Clear and Coherent  Volume:  Normal  Mood:  Anxious and Depressed  Affect:  Congruent  Thought Process:  Goal Directed and Descriptions of Associations: Intact  Orientation:  Full (Time, Place, and Person)  Thought Content: Logical   Suicidal Thoughts:  No  Homicidal Thoughts:  No  Memory:  Immediate;   Fair Recent;   Fair Remote;   Fair  Judgement:  Fair  Insight:  Fair  Psychomotor Activity:  Normal  Concentration:  Concentration: Fair and Attention Span: Fair  Recall:  [employer] of Knowledge: Fair  Language: Fair  Akathisia:  No  Handed:  Right  AIMS (if indicated): UTA  Assets:  Communication Skills Desire for Improvement Housing Social Support  ADL's:  Intact  Cognition: WNL  Sleep:  Restless due to pain   Screenings: GAD-7   Flowsheet Row Video Visit from 05/17/2021 in [facility]  Total GAD-7  Score 20    PHQ2-9   Flowsheet Row Video Visit from 05/17/2021 in [facility] Video Visit from 05/03/2021 in [facility] Video Visit from 04/02/2021 in [facility] Video Visit from 03/13/2021 in [facility] Video Visit from 02/23/2021 in [facility]  PHQ-2 Total Score 4 5 1 3 6   PHQ-9 Total Score 13 12 -- 10 16    Flowsheet Row Video Visit from 05/03/2021 in [facility] from 04/24/2021 in [facility] from 03/01/2021 in [facility] Low Risk No Risk No Risk       Assessment and Plan: Jamie [name] is a 56 year old Caucasian female who has a history of MDD, insomnia, chronic pain was evaluated by telemedicine today.  Patient is currently struggling with pain, currently on FMLA, has upcoming back surgery on May 31.  She does struggle with worsening anxiety symptoms more so because of her current situational stressors.  She will benefit from intensive psychotherapy sessions, she has been noncompliant.  Discussed plan as noted below.  Plan MDD- some progress Continue Celexa 40 mg p.o. daily Rexulti 1 mg p.o. nightly Continue CBT with Ms. Christina Hussami  Insomnia- some improvement Elavil 10 to 20 mg p.o. nightly as needed Discussed with patient that she needs to work on her sleep hygiene.  She will also need sufficient pain management.  GAD-unstable Celexa 40 mg p.o. daily Gabapentin as prescribed Patient has been noncompliant with psychotherapy sessions, encouraged compliance.  Patient will need more frequent psychotherapy sessions.  She agrees to give her therapist a call.  Follow-up in clinic in 4 weeks or sooner if needed.  This note was generated in part or whole with voice recognition software. Voice recognition is  usually quite accurate but there are transcription errors that can and very often do occur. I apologize for any typographical errors that were not detected and corrected.       Ursula Alert, MD 05/17/2021, 10:00 AM

## 2021-05-18 ENCOUNTER — Encounter (INDEPENDENT_AMBULATORY_CARE_PROVIDER_SITE_OTHER): Payer: Self-pay | Admitting: Vascular Surgery

## 2021-05-18 ENCOUNTER — Encounter (INDEPENDENT_AMBULATORY_CARE_PROVIDER_SITE_OTHER): Payer: Self-pay

## 2021-05-18 ENCOUNTER — Telehealth (INDEPENDENT_AMBULATORY_CARE_PROVIDER_SITE_OTHER): Payer: Self-pay | Admitting: Vascular Surgery

## 2021-05-18 ENCOUNTER — Ambulatory Visit (INDEPENDENT_AMBULATORY_CARE_PROVIDER_SITE_OTHER): Payer: BC Managed Care – PPO | Admitting: Vascular Surgery

## 2021-05-18 VITALS — BP 124/84 | HR 64 | Resp 16 | Ht 62.0 in | Wt 183.4 lb

## 2021-05-18 DIAGNOSIS — I10 Essential (primary) hypertension: Secondary | ICD-10-CM | POA: Diagnosis not present

## 2021-05-18 DIAGNOSIS — I82622 Acute embolism and thrombosis of deep veins of left upper extremity: Secondary | ICD-10-CM | POA: Diagnosis not present

## 2021-05-18 DIAGNOSIS — D735 Infarction of spleen: Secondary | ICD-10-CM

## 2021-05-18 DIAGNOSIS — I748 Embolism and thrombosis of other arteries: Secondary | ICD-10-CM | POA: Diagnosis not present

## 2021-05-18 DIAGNOSIS — M1611 Unilateral primary osteoarthritis, right hip: Secondary | ICD-10-CM

## 2021-05-18 NOTE — Telephone Encounter (Signed)
I have spoken with the surgery scheduler at [facility] the pt is scheduled for 05/24/21@11AM  her covid test is scheduled for 05/22/21 between 8:50AM and 12:00 Noon.   The pt was made aware of her surgery and the pre op instructions as well as her Covid test time. The instructions were mailed to her home.

## 2021-05-18 NOTE — H&P (View-Only) (Signed)
  Patient ID: Jamie [name], female   DOB: 03/29/1966, 55 y.o.   MRN: [id number]  Chief Complaint  Patient presents with  . New Admit To SNF    Ref Menz possible ivc filter total hip arthroplasty schedule 05/29/21    HPI Jamie [name] is a 55 y.o. female.  I am asked to see the patient by Dr. Menz for evaluation for filter placement preliminary to an upcoming hip replacement.  Her hip replacement is scheduled for May 31 and is necessary secondary to disabling arthritis of her hip.  She has 2 previous thrombotic problems that make her high risk for any surgery and hip replacement is one of the largest risk surgeries for DVT and PE.  She has had a previous upper extremity DVT as well as a previous splenic vein thrombosis.  She is not currently on anticoagulation fully, but has been treated with anticoagulation in the past.  In discussions with her orthopedic surgeon, she is felt to be very high risk for a hip replacement and in addition to perioperative DVT prophylaxis and anticoagulation, he is asked her to consider an IVC filter as well.     Past Medical History:  Diagnosis Date  . Anxiety   . Brachial neuritis   . Chronic neck pain   . Chronic pain not due to malignancy   . Depressive disorder    followed by Dr. John Clapacs.  . Disturbance, sleep   . Elevated blood pressure reading without diagnosis of hypertension   . Embolism and thrombosis of splenic artery   . H/O thrombophlebitis   . Infarction of spleen   . Lumbosacral neuritis   . Obesity, Class II, BMI 35-39.9, with comorbidity     Past Surgical History:  Procedure Laterality Date  . CESAREAN SECTION  09/24/2010  . TONSILLECTOMY AND ADENOIDECTOMY  09/24/2010  . TUBAL LIGATION  09/24/2010     Family History  Problem Relation Age of Onset  . Anxiety disorder Mother   . Depression Mother   . Alcohol abuse Brother       Social History   Tobacco Use  . Smoking status: Never Smoker  . Smokeless tobacco:  Never Used  Vaping Use  . Vaping Use: Never used  Substance Use Topics  . Alcohol use: Yes    Alcohol/week: 0.0 standard drinks    Comment: social  . Drug use: Yes    Types: Marijuana    Comment: 6 mths ago    Allergies  Allergen Reactions  . Neuromuscular Blocking Agents Other (See Comments)    Don't work  . Hydrocodone-Acetaminophen Nausea Only    Current Outpatient Medications  Medication Sig Dispense Refill  . amitriptyline (ELAVIL) 10 MG tablet Take 1-2 tablets (10-20 mg total) by mouth at bedtime as needed for sleep. 60 tablet 1  . brexpiprazole (REXULTI) 1 MG TABS tablet Take 30 tablets (30 mg total) by mouth daily. (Patient taking differently: Take 1 mg by mouth at bedtime.) 30 tablet 1  . celecoxib (CELEBREX) 200 MG capsule Take 200 mg by mouth daily as needed for mild pain or moderate pain.    . citalopram (CELEXA) 40 MG tablet Take 1 tablet (40 mg total) by mouth daily. 30 tablet 1  . gabapentin (NEURONTIN) 800 MG tablet Take 1 tablet by mouth twice daily (Patient taking differently: Take 800 mg by mouth 2 (two) times daily.) 60 tablet 0  . propranolol (INDERAL) 10 MG tablet Take 1 tablet twice a day as   needed for breakthrough anxiety 60 tablet 0  . traMADol (ULTRAM) 50 MG tablet Take 100 mg by mouth every 8 (eight) hours as needed for moderate pain or severe pain.     No current facility-administered medications for this visit.      REVIEW OF SYSTEMS (Negative unless checked)  Constitutional: []Weight loss  []Fever  []Chills Cardiac: []Chest pain   []Chest pressure   []Palpitations   []Shortness of breath when laying flat   []Shortness of breath at rest   []Shortness of breath with exertion. Vascular:  []Pain in legs with walking   []Pain in legs at rest   []Pain in legs when laying flat   []Claudication   []Pain in feet when walking  []Pain in feet at rest  []Pain in feet when laying flat   [x]History of DVT   []Phlebitis   []Swelling in legs   []Varicose veins    []Non-healing ulcers Pulmonary:   []Uses home oxygen   []Productive cough   []Hemoptysis   []Wheeze  []COPD   []Asthma Neurologic:  []Dizziness  []Blackouts   []Seizures   []History of stroke   []History of TIA  []Aphasia   []Temporary blindness   []Dysphagia   []Weakness or numbness in arms   []Weakness or numbness in legs Musculoskeletal:  [x]Arthritis   []Joint swelling   [x]Joint pain   []Low back pain Hematologic:  []Easy bruising  []Easy bleeding   []Hypercoagulable state   []Anemic  []Hepatitis Gastrointestinal:  []Blood in stool   []Vomiting blood  []Gastroesophageal reflux/heartburn   []Abdominal pain Genitourinary:  []Chronic kidney disease   []Difficult urination  []Frequent urination  []Burning with urination   []Hematuria Skin:  []Rashes   []Ulcers   []Wounds Psychological:  []History of anxiety   [] History of major depression.    Physical Exam BP 124/84 (BP Location: Right Arm)   Pulse 64   Resp 16   Ht 5' 2" (1.575 m)   Wt 183 lb 6.4 oz (83.2 kg)   BMI 33.54 kg/m  Gen:  WD/WN, NAD Head: [place]/AT, No temporalis wasting.  Ear/Nose/Throat: Hearing grossly intact, nares w/o erythema or drainage, oropharynx w/o Erythema/Exudate Eyes: Conjunctiva clear, sclera non-icteric  Neck: trachea midline.  No JVD.  Pulmonary:  Good air movement, respirations not labored, no use of accessory muscles  Cardiac: RRR, no JVD Vascular:  Vessel Right Left  Radial Palpable Palpable                                   Gastrointestinal:. No masses, surgical incisions, or scars. Musculoskeletal: M/S 5/5 throughout.  Extremities without ischemic changes.  No deformity or atrophy.  Trace lower extremity edema. Neurologic: Sensation grossly intact in extremities.  Symmetrical.  Speech is fluent. Motor exam as listed above. Psychiatric: Judgment intact, Mood & affect appropriate for pt's clinical situation. Dermatologic: No rashes or ulcers noted.  No cellulitis or open  wounds.    Radiology No results found.  Labs No results found for this or any previous visit (from the past 2160 hour(s)).  Assessment/Plan:  Hypertension goal BP (blood pressure) < 140/90 blood pressure control important in reducing the progression of atherosclerotic disease. On appropriate oral medications.   Deep vein thrombosis (DVT) of left upper extremity (HCC) Previous history of DVT.  With upcoming hip replacement, she would be at high risk for thromboembolic complications.  Embolism of splenic artery (HCC) Previous history of   splenic artery thrombosis certainly raises the specter of a hypercoagulable state.  Osteoarthritis of right hip The patient has an upcoming hip replacement for osteoarthritis of the right hip.  Given her 2 previous thromboembolic issues, she would be at high risk for DVT and PE in the perioperative period particular with a hip replacement which likely represents the highest risk surgery for DVT.  IVC filter would not prevent a DVT, but does do a good job of dramatically reducing the risk of pulmonary embolus if DVT develops.  She is very interested in having this done and we will get this scheduled for her in the near future at her convenience.  I will plan to see her back about 6 to 8 weeks postoperatively with a DVT study, and plan filter removal if it is no longer necessary following this visit.      Leotis Pain 05/18/2021, 11:51 AM   This note was created with Dragon medical transcription system.  Any errors from dictation are unintentional.

## 2021-05-18 NOTE — Progress Notes (Signed)
Patient ID: Jamie [name], female   DOB: 10-03-1966, 55 y.o.   MRN: [id number]  Chief Complaint  Patient presents with  . New Admit To SNF    Ref Rudene Christians possible ivc filter total hip arthroplasty schedule 05/29/21    HPI Jamie [name] is a 55 y.o. female.  I am asked to see the patient by Dr. Rudene Christians for evaluation for filter placement preliminary to an upcoming hip replacement.  Her hip replacement is scheduled for May 31 and is necessary secondary to disabling arthritis of her hip.  She has 2 previous thrombotic problems that make her high risk for any surgery and hip replacement is one of the largest risk surgeries for DVT and PE.  She has had a previous upper extremity DVT as well as a previous splenic vein thrombosis.  She is not currently on anticoagulation fully, but has been treated with anticoagulation in the past.  In discussions with her orthopedic surgeon, she is felt to be very high risk for a hip replacement and in addition to perioperative DVT prophylaxis and anticoagulation, he is asked her to consider an IVC filter as well.     Past Medical History:  Diagnosis Date  . Anxiety   . Brachial neuritis   . Chronic neck pain   . Chronic pain not due to malignancy   . Depressive disorder    followed by Dr. Alethia Berthold.  . Disturbance, sleep   . Elevated blood pressure reading without diagnosis of hypertension   . Embolism and thrombosis of splenic artery   . H/O thrombophlebitis   . Infarction of spleen   . Lumbosacral neuritis   . Obesity, Class II, BMI 35-39.9, with comorbidity     Past Surgical History:  Procedure Laterality Date  . CESAREAN SECTION  09/24/2010  . TONSILLECTOMY AND ADENOIDECTOMY  09/24/2010  . TUBAL LIGATION  09/24/2010     Family History  Problem Relation Age of Onset  . Anxiety disorder Mother   . Depression Mother   . Alcohol abuse Brother       Social History   Tobacco Use  . Smoking status: Never Smoker  . Smokeless tobacco:  Never Used  Vaping Use  . Vaping Use: Never used  Substance Use Topics  . Alcohol use: Yes    Alcohol/week: 0.0 standard drinks    Comment: social  . Drug use: Yes    Types: Marijuana    Comment: 6 mths ago    Allergies  Allergen Reactions  . Neuromuscular Blocking Agents Other (See Comments)    Don't work  . Hydrocodone-Acetaminophen Nausea Only    Current Outpatient Medications  Medication Sig Dispense Refill  . amitriptyline (ELAVIL) 10 MG tablet Take 1-2 tablets (10-20 mg total) by mouth at bedtime as needed for sleep. 60 tablet 1  . brexpiprazole (REXULTI) 1 MG TABS tablet Take 30 tablets (30 mg total) by mouth daily. (Patient taking differently: Take 1 mg by mouth at bedtime.) 30 tablet 1  . celecoxib (CELEBREX) 200 MG capsule Take 200 mg by mouth daily as needed for mild pain or moderate pain.    . citalopram (CELEXA) 40 MG tablet Take 1 tablet (40 mg total) by mouth daily. 30 tablet 1  . gabapentin (NEURONTIN) 800 MG tablet Take 1 tablet by mouth twice daily (Patient taking differently: Take 800 mg by mouth 2 (two) times daily.) 60 tablet 0  . propranolol (INDERAL) 10 MG tablet Take 1 tablet twice a day as  needed for breakthrough anxiety 60 tablet 0  . traMADol (ULTRAM) 50 MG tablet Take 100 mg by mouth every 8 (eight) hours as needed for moderate pain or severe pain.     No current facility-administered medications for this visit.      REVIEW OF SYSTEMS (Negative unless checked)  Constitutional: [] Weight loss  [] Fever  [] Chills Cardiac: [] Chest pain   [] Chest pressure   [] Palpitations   [] Shortness of breath when laying flat   [] Shortness of breath at rest   [] Shortness of breath with exertion. Vascular:  [] Pain in legs with walking   [] Pain in legs at rest   [] Pain in legs when laying flat   [] Claudication   [] Pain in feet when walking  [] Pain in feet at rest  [] Pain in feet when laying flat   [x] History of DVT   [] Phlebitis   [] Swelling in legs   [] Varicose veins    [] Non-healing ulcers Pulmonary:   [] Uses home oxygen   [] Productive cough   [] Hemoptysis   [] Wheeze  [] COPD   [] Asthma Neurologic:  [] Dizziness  [] Blackouts   [] Seizures   [] History of stroke   [] History of TIA  [] Aphasia   [] Temporary blindness   [] Dysphagia   [] Weakness or numbness in arms   [] Weakness or numbness in legs Musculoskeletal:  [x] Arthritis   [] Joint swelling   [x] Joint pain   [] Low back pain Hematologic:  [] Easy bruising  [] Easy bleeding   [] Hypercoagulable state   [] Anemic  [] Hepatitis Gastrointestinal:  [] Blood in stool   [] Vomiting blood  [] Gastroesophageal reflux/heartburn   [] Abdominal pain Genitourinary:  [] Chronic kidney disease   [] Difficult urination  [] Frequent urination  [] Burning with urination   [] Hematuria Skin:  [] Rashes   [] Ulcers   [] Wounds Psychological:  [] History of anxiety   []  History of major depression.    Physical Exam BP 124/84 (BP Location: Right Arm)   Pulse 64   Resp 16   Ht 5\' 2"  (1.575 m)   Wt 183 lb 6.4 oz (83.2 kg)   BMI 33.54 kg/m  Gen:  WD/WN, NAD Head: [place]/AT, No temporalis wasting.  Ear/Nose/Throat: Hearing grossly intact, nares w/o erythema or drainage, oropharynx w/o Erythema/Exudate Eyes: Conjunctiva clear, sclera non-icteric  Neck: trachea midline.  No JVD.  Pulmonary:  Good air movement, respirations not labored, no use of accessory muscles  Cardiac: RRR, no JVD Vascular:  Vessel Right Left  Radial Palpable Palpable                                   Gastrointestinal:. No masses, surgical incisions, or scars. Musculoskeletal: M/S 5/5 throughout.  Extremities without ischemic changes.  No deformity or atrophy.  Trace lower extremity edema. Neurologic: Sensation grossly intact in extremities.  Symmetrical.  Speech is fluent. Motor exam as listed above. Psychiatric: Judgment intact, Mood & affect appropriate for pt's clinical situation. Dermatologic: No rashes or ulcers noted.  No cellulitis or open  wounds.    Radiology No results found.  Labs No results found for this or any previous visit (from the past 2160 hour(s)).  Assessment/Plan:  Hypertension goal BP (blood pressure) < 140/90 blood pressure control important in reducing the progression of atherosclerotic disease. On appropriate oral medications.   Deep vein thrombosis (DVT) of left upper extremity (HCC) Previous history of DVT.  With upcoming hip replacement, she would be at high risk for thromboembolic complications.  Embolism of splenic artery (HCC) Previous history of  splenic artery thrombosis certainly raises the specter of a hypercoagulable state.  Osteoarthritis of right hip The patient has an upcoming hip replacement for osteoarthritis of the right hip.  Given her 2 previous thromboembolic issues, she would be at high risk for DVT and PE in the perioperative period particular with a hip replacement which likely represents the highest risk surgery for DVT.  IVC filter would not prevent a DVT, but does do a good job of dramatically reducing the risk of pulmonary embolus if DVT develops.  She is very interested in having this done and we will get this scheduled for her in the near future at her convenience.  I will plan to see her back about 6 to 8 weeks postoperatively with a DVT study, and plan filter removal if it is no longer necessary following this visit.      Leotis Pain 05/18/2021, 11:51 AM   This note was created with Dragon medical transcription system.  Any errors from dictation are unintentional.

## 2021-05-18 NOTE — Assessment & Plan Note (Signed)
The patient has an upcoming hip replacement for osteoarthritis of the right hip.  Given her 2 previous thromboembolic issues, she would be at high risk for DVT and PE in the perioperative period particular with a hip replacement which likely represents the highest risk surgery for DVT.  IVC filter would not prevent a DVT, but does do a good job of dramatically reducing the risk of pulmonary embolus if DVT develops.  She is very interested in having this done and we will get this scheduled for her in the near future at her convenience.  I will plan to see her back about 6 to 8 weeks postoperatively with a DVT study, and plan filter removal if it is no longer necessary following this visit.

## 2021-05-18 NOTE — Assessment & Plan Note (Signed)
Previous history of DVT.  With upcoming hip replacement, she would be at high risk for thromboembolic complications.

## 2021-05-18 NOTE — Telephone Encounter (Signed)
Got a call from Nancee Liter from scheduling, she needs a date and time to put on sched for surgery for IVC filter Dr. Lucky Cowboy.  She would like a call back with that information.

## 2021-05-18 NOTE — Assessment & Plan Note (Signed)
blood pressure control important in reducing the progression of atherosclerotic disease. On appropriate oral medications.  

## 2021-05-18 NOTE — Assessment & Plan Note (Signed)
Previous history of splenic artery thrombosis certainly raises the specter of a hypercoagulable state.

## 2021-05-21 ENCOUNTER — Other Ambulatory Visit: Payer: Self-pay

## 2021-05-21 ENCOUNTER — Other Ambulatory Visit: Payer: Self-pay | Admitting: Psychiatry

## 2021-05-21 ENCOUNTER — Encounter
Admission: RE | Admit: 2021-05-21 | Discharge: 2021-05-21 | Disposition: A | Discharge status: Home / Self Care | Payer: BC Managed Care – PPO | Source: Ambulatory Visit | Attending: Orthopedic Surgery | Admitting: Orthopedic Surgery

## 2021-05-21 ENCOUNTER — Encounter: Payer: Self-pay | Admitting: Urgent Care

## 2021-05-21 DIAGNOSIS — I10 Essential (primary) hypertension: Secondary | ICD-10-CM | POA: Diagnosis not present

## 2021-05-21 DIAGNOSIS — M1611 Unilateral primary osteoarthritis, right hip: Secondary | ICD-10-CM | POA: Diagnosis not present

## 2021-05-21 DIAGNOSIS — Z01818 Encounter for other preprocedural examination: Secondary | ICD-10-CM | POA: Insufficient documentation

## 2021-05-21 DIAGNOSIS — Z79899 Other long term (current) drug therapy: Secondary | ICD-10-CM | POA: Diagnosis not present

## 2021-05-21 DIAGNOSIS — Z20822 Contact with and (suspected) exposure to covid-19: Secondary | ICD-10-CM | POA: Diagnosis not present

## 2021-05-21 DIAGNOSIS — Z86718 Personal history of other venous thrombosis and embolism: Secondary | ICD-10-CM | POA: Diagnosis present

## 2021-05-21 DIAGNOSIS — F411 Generalized anxiety disorder: Secondary | ICD-10-CM

## 2021-05-21 HISTORY — DX: Malignant neoplasm of cervix uteri, unspecified: C53.9

## 2021-05-21 HISTORY — DX: Acute embolism and thrombosis of unspecified deep veins of unspecified lower extremity: I82.409

## 2021-05-21 LAB — CBC WITH DIFFERENTIAL/PLATELET
Abs Immature Granulocytes: 0.02 10*3/uL (ref 0.00–0.07)
Basophils Absolute: 0.1 10*3/uL (ref 0.0–0.1)
Basophils Relative: 1 %
Eosinophils Absolute: 0.4 10*3/uL (ref 0.0–0.5)
Eosinophils Relative: 6 %
HCT: 41.9 % (ref 36.0–46.0)
Hemoglobin: 14.2 g/dL (ref 12.0–15.0)
Immature Granulocytes: 0 %
Lymphocytes Relative: 36 %
Lymphs Abs: 2.2 10*3/uL (ref 0.7–4.0)
MCH: 30.4 pg (ref 26.0–34.0)
MCHC: 33.9 g/dL (ref 30.0–36.0)
MCV: 89.7 fL (ref 80.0–100.0)
Monocytes Absolute: 0.4 10*3/uL (ref 0.1–1.0)
Monocytes Relative: 6 %
Neutro Abs: 3.1 10*3/uL (ref 1.7–7.7)
Neutrophils Relative %: 51 %
Platelets: 309 10*3/uL (ref 150–400)
RBC: 4.67 MIL/uL (ref 3.87–5.11)
RDW: 12 % (ref 11.5–15.5)
WBC: 6.1 10*3/uL (ref 4.0–10.5)
nRBC: 0 % (ref 0.0–0.2)

## 2021-05-21 LAB — COMPREHENSIVE METABOLIC PANEL
ALT: 15 U/L (ref 0–44)
AST: 22 U/L (ref 15–41)
Albumin: 3.9 g/dL (ref 3.5–5.0)
Alkaline Phosphatase: 78 U/L (ref 38–126)
Anion gap: 9 (ref 5–15)
BUN: 19 mg/dL (ref 6–20)
CO2: 28 mmol/L (ref 22–32)
Calcium: 9 mg/dL (ref 8.9–10.3)
Chloride: 102 mmol/L (ref 98–111)
Creatinine, Ser: 0.72 mg/dL (ref 0.44–1.00)
GFR, Estimated: >60 mL/min (ref >60)
Glucose, Bld: 74 mg/dL (ref 70–99)
Potassium: 3.5 mmol/L (ref 3.5–5.1)
Sodium: 139 mmol/L (ref 135–145)
Total Bilirubin: 0.7 mg/dL (ref 0.3–1.2)
Total Protein: 7.5 g/dL (ref 6.5–8.1)

## 2021-05-21 LAB — URINALYSIS, ROUTINE W REFLEX MICROSCOPIC
Bilirubin Urine: NEGATIVE
Glucose, UA: NEGATIVE mg/dL
Hgb urine dipstick: NEGATIVE
Ketones, ur: NEGATIVE mg/dL
Leukocytes,Ua: NEGATIVE
Nitrite: NEGATIVE
Protein, ur: NEGATIVE mg/dL
Specific Gravity, Urine: 1.018 (ref 1.005–1.030)
pH: 7 (ref 5.0–8.0)

## 2021-05-21 LAB — SURGICAL PCR SCREEN
MRSA, PCR: NEGATIVE
Staphylococcus aureus: NEGATIVE

## 2021-05-21 NOTE — Patient Instructions (Addendum)
Your procedure is scheduled on: 05/29/21 Report to [facility]. To find out your arrival time please call [phone] between 1PM - 3PM on 05/25/21.  Remember: Instructions that are not followed completely may result in serious medical risk, up to and including death, or upon the discretion of your surgeon and anesthesiologist your surgery may need to be rescheduled.     _X__ 1. Do not eat food after midnight the night before your procedure.                 No gum chewing or hard candies. You may drink clear liquids up to 2 hours                 before you are scheduled to arrive for your surgery- DO not drink clear                 liquids within 2 hours of the start of your surgery.                 Clear Liquids include:  water, apple juice without pulp, clear carbohydrate                 drink such as Clearfast or Gatorade, Black Coffee or Tea (Do not add                 anything to coffee or tea). Diabetics water only  __X__2.  On the morning of surgery brush your teeth with toothpaste and water, you                 may rinse your mouth with mouthwash if you wish.  Do not swallow any              toothpaste of mouthwash.     _X__ 3.  No Alcohol for 24 hours before or after surgery.   _X__ 4.  Do Not Smoke or use e-cigarettes For 24 Hours Prior to Your Surgery.                 Do not use any chewable tobacco products for at least 6 hours prior to                 surgery.  ____  5.  Bring all medications with you on the day of surgery if instructed.   __X__  6.  Notify your doctor if there is any change in your medical condition      (cold, fever, infections).     Do not wear jewelry, make-up, hairpins, clips or nail polish. Do not wear lotions, powders, or perfumes.  Do not shave 48 hours prior to surgery. Men may shave face and neck. Do not bring valuables to the hospital.    [facility] is not responsible for any belongings or  valuables.  Contacts, dentures/partials or body piercings may not be worn into surgery. Bring a case for your contacts, glasses or hearing aids, a denture cup will be supplied. Leave your suitcase in the car. After surgery it may be brought to your room. For patients admitted to the hospital, discharge time is determined by your treatment team.   Patients discharged the day of surgery will not be allowed to drive home.   Please read over the following fact sheets that you were given:   MRSA Information  __X__ Take these medicines the morning of surgery with A SIP OF WATER:  1. citalopram (CELEXA) 40 MG tablet  2. gabapentin (NEURONTIN) 800 MG tablet  3.   4. May take propranolol for anxiety and/or tramadol for pain  5.  6.  ____ Fleet Enema (as directed)   __X__ Use CHG Soap/SAGE wipes as directed  ____ Use inhalers on the day of surgery  ____ Stop metformin/Janumet/Farxiga 2 days prior to surgery    ____ Take 1/2 of usual insulin dose the night before surgery. No insulin the morning          of surgery.   ____ Stop Blood Thinners Coumadin/Plavix/Xarelto/Pleta/Pradaxa/Eliquis/Effient/Aspirin  on   Or contact your Surgeon, Cardiologist or Medical Doctor regarding  ability to stop your blood thinners  __X__ Stop Anti-inflammatories 7 days before surgery such as Advil, Ibuprofen, Motrin,  BC or Goodies Powder, Naprosyn, Naproxen, Aleve, Aspirin    __X__ Stop all herbal supplements, fish oil or vitamin E until after surgery.    ____ Bring C-Pap to the hospital.     Ensure pre surgery drink provided

## 2021-05-21 NOTE — Pre-Procedure Instructions (Signed)
Patient wished to not repeat EKG today because she had one in November at PCP visit which was requested by her psyche MD for medication change.

## 2021-05-21 NOTE — Telephone Encounter (Signed)
Documentation only.

## 2021-05-22 ENCOUNTER — Other Ambulatory Visit: Admission: RE | Admit: 2021-05-22 | Payer: BC Managed Care – PPO | Source: Ambulatory Visit

## 2021-05-22 ENCOUNTER — Telehealth (INDEPENDENT_AMBULATORY_CARE_PROVIDER_SITE_OTHER): Payer: Self-pay

## 2021-05-22 ENCOUNTER — Other Ambulatory Visit
Admission: RE | Admit: 2021-05-22 | Discharge: 2021-05-22 | Disposition: A | Discharge status: Home / Self Care | Payer: BC Managed Care – PPO | Source: Ambulatory Visit | Attending: Vascular Surgery | Admitting: Vascular Surgery

## 2021-05-22 DIAGNOSIS — M1611 Unilateral primary osteoarthritis, right hip: Secondary | ICD-10-CM | POA: Diagnosis not present

## 2021-05-22 DIAGNOSIS — Z01812 Encounter for preprocedural laboratory examination: Secondary | ICD-10-CM | POA: Insufficient documentation

## 2021-05-22 DIAGNOSIS — Z20822 Contact with and (suspected) exposure to covid-19: Secondary | ICD-10-CM | POA: Insufficient documentation

## 2021-05-22 LAB — SARS CORONAVIRUS 2 (TAT 6-24 HRS): SARS Coronavirus 2: NEGATIVE

## 2021-05-22 NOTE — Telephone Encounter (Signed)
I returned the call to the patient regarding her scheduled arrival time to the [facility] for her procedure on 05/24/21 with Dr. Lucky Cowboy of an IVC filter placement. Patient was given the time of arrival at 9:30 am instead of 10:00 am.

## 2021-05-24 ENCOUNTER — Encounter: Payer: Self-pay | Admitting: Vascular Surgery

## 2021-05-24 ENCOUNTER — Ambulatory Visit
Admission: RE | Admit: 2021-05-24 | Discharge: 2021-05-24 | Disposition: A | Discharge status: Home / Self Care | Payer: BC Managed Care – PPO | Source: Ambulatory Visit | Attending: Vascular Surgery | Admitting: Vascular Surgery

## 2021-05-24 ENCOUNTER — Other Ambulatory Visit: Payer: Self-pay

## 2021-05-24 ENCOUNTER — Encounter
Admission: RE | Disposition: A | Discharge status: Home / Self Care | Payer: Self-pay | Source: Ambulatory Visit | Attending: Vascular Surgery

## 2021-05-24 ENCOUNTER — Other Ambulatory Visit (INDEPENDENT_AMBULATORY_CARE_PROVIDER_SITE_OTHER): Payer: Self-pay | Admitting: Nurse Practitioner

## 2021-05-24 DIAGNOSIS — M1611 Unilateral primary osteoarthritis, right hip: Secondary | ICD-10-CM | POA: Insufficient documentation

## 2021-05-24 DIAGNOSIS — Z20822 Contact with and (suspected) exposure to covid-19: Secondary | ICD-10-CM | POA: Insufficient documentation

## 2021-05-24 DIAGNOSIS — Z79899 Other long term (current) drug therapy: Secondary | ICD-10-CM | POA: Insufficient documentation

## 2021-05-24 DIAGNOSIS — I10 Essential (primary) hypertension: Secondary | ICD-10-CM | POA: Insufficient documentation

## 2021-05-24 DIAGNOSIS — Z86718 Personal history of other venous thrombosis and embolism: Secondary | ICD-10-CM | POA: Diagnosis not present

## 2021-05-24 HISTORY — PX: IVC FILTER INSERTION: CATH118245

## 2021-05-24 SURGERY — IVC FILTER INSERTION
Anesthesia: Moderate Sedation

## 2021-05-24 MED ORDER — DIPHENHYDRAMINE HCL 50 MG/ML IJ SOLN: 50.0000 mg | Freq: Once | INTRAMUSCULAR | Status: DC | PRN

## 2021-05-24 MED ORDER — SODIUM CHLORIDE 0.9 % IV SOLN: INTRAVENOUS | Status: DC

## 2021-05-24 MED ORDER — METHYLPREDNISOLONE SODIUM SUCC 125 MG IJ SOLR: 125.0000 mg | Freq: Once | INTRAMUSCULAR | Status: DC | PRN

## 2021-05-24 MED ORDER — MIDAZOLAM HCL 2 MG/ML PO SYRP: 8.0000 mg | ORAL_SOLUTION | Freq: Once | ORAL | Status: DC | PRN

## 2021-05-24 MED ORDER — CEFAZOLIN SODIUM-DEXTROSE 2-4 GM/100ML-% IV SOLN
INTRAVENOUS | Status: AC
  Filled 2021-05-24: qty 100

## 2021-05-24 MED ORDER — IODIXANOL 320 MG/ML IV SOLN
INTRAVENOUS | Status: DC | PRN
  Administered 2021-05-24: 15 mL

## 2021-05-24 MED ORDER — FENTANYL CITRATE (PF) 100 MCG/2ML IJ SOLN
INTRAMUSCULAR | Status: AC
  Filled 2021-05-24: qty 2

## 2021-05-24 MED ORDER — MIDAZOLAM HCL 2 MG/2ML IJ SOLN
INTRAMUSCULAR | Status: DC | PRN
  Administered 2021-05-24: 2 mg via INTRAVENOUS

## 2021-05-24 MED ORDER — HYDROMORPHONE HCL 1 MG/ML IJ SOLN
INTRAMUSCULAR | Status: AC
  Filled 2021-05-24: qty 0.5

## 2021-05-24 MED ORDER — HYDROMORPHONE HCL 1 MG/ML IJ SOLN
1.0000 mg | Freq: Once | INTRAMUSCULAR | Status: AC | PRN
  Administered 2021-05-24: 1 mg via INTRAVENOUS

## 2021-05-24 MED ORDER — FENTANYL CITRATE (PF) 100 MCG/2ML IJ SOLN
INTRAMUSCULAR | Status: DC | PRN
  Administered 2021-05-24: 50 ug via INTRAVENOUS

## 2021-05-24 MED ORDER — ONDANSETRON HCL 4 MG/2ML IJ SOLN: 4.0000 mg | Freq: Four times a day (QID) | INTRAMUSCULAR | Status: DC | PRN

## 2021-05-24 MED ORDER — CEFAZOLIN SODIUM-DEXTROSE 2-4 GM/100ML-% IV SOLN: 2.0000 g | Freq: Once | INTRAVENOUS | Status: DC

## 2021-05-24 MED ORDER — MIDAZOLAM HCL 2 MG/2ML IJ SOLN
INTRAMUSCULAR | Status: AC
  Filled 2021-05-24: qty 2

## 2021-05-24 MED ORDER — FAMOTIDINE 20 MG PO TABS: 40.0000 mg | ORAL_TABLET | Freq: Once | ORAL | Status: DC | PRN

## 2021-05-24 SURGICAL SUPPLY — 5 items
CANNULA 5F STIFF (CANNULA) ×2 IMPLANT
COVER PROBE U/S 5X48 (MISCELLANEOUS) ×2 IMPLANT
KIT FEMORAL DEL DENALI (Miscellaneous) ×2 IMPLANT
PACK ANGIOGRAPHY (CUSTOM PROCEDURE TRAY) ×2 IMPLANT
WIRE GUIDERIGHT .035X150 (WIRE) ×2 IMPLANT

## 2021-05-24 NOTE — Interval H&P Note (Signed)
History and Physical Interval Note:  05/24/2021 9:49 AM  Jamie [name]  has presented today for surgery, with the diagnosis of IVC Filter Insertion   history of DVT.  The various methods of treatment have been discussed with the patient and family. After consideration of risks, benefits and other options for treatment, the patient has consented to  Procedure(s): IVC FILTER INSERTION (N/A) as a surgical intervention.  The patient's history has been reviewed, patient examined, no change in status, stable for surgery.  I have reviewed the patient's chart and labs.  Questions were answered to the patient's satisfaction.     Leotis Pain

## 2021-05-24 NOTE — Op Note (Signed)
[facility]   OPERATIVE NOTE    PRE-OPERATIVE DIAGNOSIS: Previous DVT and possible hypercoagulable state with upcoming hip replacement  POST-OPERATIVE DIAGNOSIS: same as above  PROCEDURE: 1.   Ultrasound guidance for vascular access to the right vein 2.   Catheter placement into the inferior vena cava 3.   Inferior venacavogram 4.   Placement of a Bard Denali IVC filter  SURGEON: Leotis Pain, MD  ASSISTANT(S): None  ANESTHESIA: local with Moderate Conscious Sedation for approximately 12 minutes using 2 mg of Versed and 50 mcg of Fentanyl  ESTIMATED BLOOD LOSS: minimal  CONTRAST: 15 cc  FLUORO TIME: less than one minute  FINDING(S): 1.  Patent IVC  SPECIMEN(S):  none  INDICATIONS:   Jamie [name] is a 55 y.o. female who presents with a previous history of DVT and a possible hypercoagulable state with an upcoming hip replacement.  Inferior vena cava filter is indicated for this reason.  Risks and benefits including filter thrombosis, migration, fracture, bleeding, and infection were all discussed.  We discussed that all IVC filters that we place can be removed if desired from the patient once the need for the filter has passed.    DESCRIPTION: After obtaining full informed written consent, the patient was brought back to the vascular suite. The skin was sterilely prepped and draped in a sterile surgical field was created. Moderate conscious sedation was administered during a face to face encounter with the patient throughout the procedure with my supervision of the RN administering medicines and monitoring the patient's vital signs, pulse oximetry, telemetry and mental status throughout from the start of the procedure until the patient was taken to the recovery room. The right femoral vein was accessed under direct ultrasound guidance without difficulty with a Seldinger needle and a J-wire was then placed. After skin nick and dilatation, the delivery sheath  was placed into the inferior vena cava and an inferior venacavogram was performed. This demonstrated a patent IVC with the level of the renal veins at the bottom of L1.  The filter was then deployed into the inferior vena cava at the level of the midportion of L2 just below the renal veins. The delivery sheath was then removed. Pressure was held. Sterile dressings were placed. The patient tolerated the procedure well and was taken to the recovery room in stable condition.  COMPLICATIONS: None  CONDITION: Stable  Leotis Pain  05/24/2021, 11:44 AM   This note was created with Dragon Medical transcription system. Any errors in dictation are purely unintentional.

## 2021-05-25 ENCOUNTER — Other Ambulatory Visit
Admission: RE | Admit: 2021-05-25 | Discharge: 2021-05-25 | Disposition: A | Discharge status: Home / Self Care | Payer: BC Managed Care – PPO | Source: Ambulatory Visit | Attending: Orthopedic Surgery | Admitting: Orthopedic Surgery

## 2021-05-25 DIAGNOSIS — Z20822 Contact with and (suspected) exposure to covid-19: Secondary | ICD-10-CM | POA: Insufficient documentation

## 2021-05-25 DIAGNOSIS — Z01812 Encounter for preprocedural laboratory examination: Secondary | ICD-10-CM | POA: Insufficient documentation

## 2021-05-25 LAB — SARS CORONAVIRUS 2 (TAT 6-24 HRS): SARS Coronavirus 2: NEGATIVE

## 2021-05-29 ENCOUNTER — Inpatient Hospital Stay: Payer: BC Managed Care – PPO

## 2021-05-29 ENCOUNTER — Other Ambulatory Visit: Payer: Self-pay

## 2021-05-29 ENCOUNTER — Inpatient Hospital Stay
Admission: RE | Admit: 2021-05-29 | Discharge: 2021-05-30 | DRG: 470 | Disposition: A | Discharge status: Home Health | Payer: BC Managed Care – PPO | Attending: Orthopedic Surgery | Admitting: Orthopedic Surgery

## 2021-05-29 ENCOUNTER — Observation Stay: Payer: BC Managed Care – PPO

## 2021-05-29 ENCOUNTER — Encounter: Payer: Self-pay | Admitting: Orthopedic Surgery

## 2021-05-29 ENCOUNTER — Encounter
Admission: RE | Disposition: A | Discharge status: Home Health | Payer: Self-pay | Source: Home / Self Care | Attending: Orthopedic Surgery

## 2021-05-29 DIAGNOSIS — F411 Generalized anxiety disorder: Secondary | ICD-10-CM | POA: Diagnosis present

## 2021-05-29 DIAGNOSIS — M542 Cervicalgia: Secondary | ICD-10-CM | POA: Diagnosis present

## 2021-05-29 DIAGNOSIS — Z79899 Other long term (current) drug therapy: Secondary | ICD-10-CM

## 2021-05-29 DIAGNOSIS — M1611 Unilateral primary osteoarthritis, right hip: Principal | ICD-10-CM | POA: Diagnosis present

## 2021-05-29 DIAGNOSIS — G8929 Other chronic pain: Secondary | ICD-10-CM | POA: Diagnosis present

## 2021-05-29 DIAGNOSIS — Z86718 Personal history of other venous thrombosis and embolism: Secondary | ICD-10-CM

## 2021-05-29 DIAGNOSIS — G8918 Other acute postprocedural pain: Secondary | ICD-10-CM

## 2021-05-29 DIAGNOSIS — E669 Obesity, unspecified: Secondary | ICD-10-CM | POA: Diagnosis present

## 2021-05-29 DIAGNOSIS — Z6833 Body mass index (BMI) 33.0-33.9, adult: Secondary | ICD-10-CM | POA: Diagnosis not present

## 2021-05-29 DIAGNOSIS — Z818 Family history of other mental and behavioral disorders: Secondary | ICD-10-CM | POA: Diagnosis not present

## 2021-05-29 DIAGNOSIS — Z96649 Presence of unspecified artificial hip joint: Secondary | ICD-10-CM

## 2021-05-29 DIAGNOSIS — Z8541 Personal history of malignant neoplasm of cervix uteri: Secondary | ICD-10-CM

## 2021-05-29 DIAGNOSIS — Z885 Allergy status to narcotic agent status: Secondary | ICD-10-CM

## 2021-05-29 DIAGNOSIS — F331 Major depressive disorder, recurrent, moderate: Secondary | ICD-10-CM

## 2021-05-29 DIAGNOSIS — Z419 Encounter for procedure for purposes other than remedying health state, unspecified: Secondary | ICD-10-CM

## 2021-05-29 DIAGNOSIS — I10 Essential (primary) hypertension: Secondary | ICD-10-CM | POA: Diagnosis present

## 2021-05-29 HISTORY — PX: TOTAL HIP ARTHROPLASTY: SHX124

## 2021-05-29 LAB — URINE DRUG SCREEN, QUALITATIVE (ARMC ONLY)
Amphetamines, Ur Screen: NOT DETECTED
Barbiturates, Ur Screen: NOT DETECTED
Benzodiazepine, Ur Scrn: NOT DETECTED
Cannabinoid 50 Ng, Ur ~~LOC~~: POSITIVE — AB
Cocaine Metabolite,Ur ~~LOC~~: NOT DETECTED
MDMA (Ecstasy)Ur Screen: NOT DETECTED
Methadone Scn, Ur: NOT DETECTED
Opiate, Ur Screen: NOT DETECTED
Phencyclidine (PCP) Ur S: NOT DETECTED
Tricyclic, Ur Screen: POSITIVE — AB

## 2021-05-29 LAB — POCT PREGNANCY, URINE: Preg Test, Ur: NEGATIVE

## 2021-05-29 LAB — ABO/RH: ABO/RH(D): A NEG

## 2021-05-29 SURGERY — ARTHROPLASTY, HIP, TOTAL, ANTERIOR APPROACH
Anesthesia: Spinal | Site: Hip | Laterality: Right

## 2021-05-29 MED ORDER — PANTOPRAZOLE SODIUM 40 MG PO TBEC
40.0000 mg | DELAYED_RELEASE_TABLET | Freq: Every day | ORAL | Status: DC
  Administered 2021-05-29 – 2021-05-30 (×2): 40 mg via ORAL
  Filled 2021-05-29 (×2): qty 1

## 2021-05-29 MED ORDER — PROPOFOL 10 MG/ML IV BOLUS
INTRAVENOUS | Status: DC | PRN
  Administered 2021-05-29 (×2): 20 mg via INTRAVENOUS

## 2021-05-29 MED ORDER — MIDAZOLAM HCL 5 MG/5ML IJ SOLN
INTRAMUSCULAR | Status: DC | PRN
  Administered 2021-05-29 (×2): 1 mg via INTRAVENOUS

## 2021-05-29 MED ORDER — SODIUM CHLORIDE 0.9 % IV SOLN
INTRAVENOUS | Status: DC | PRN
  Administered 2021-05-29: 60 mL

## 2021-05-29 MED ORDER — PROPRANOLOL HCL 20 MG PO TABS
10.0000 mg | ORAL_TABLET | Freq: Two times a day (BID) | ORAL | Status: DC | PRN
  Administered 2021-05-30: 10 mg via ORAL
  Filled 2021-05-29: qty 1

## 2021-05-29 MED ORDER — CEFAZOLIN SODIUM-DEXTROSE 2-4 GM/100ML-% IV SOLN
INTRAVENOUS | Status: AC
  Filled 2021-05-29: qty 100

## 2021-05-29 MED ORDER — PROPOFOL 1000 MG/100ML IV EMUL
INTRAVENOUS | Status: AC
  Filled 2021-05-29: qty 100

## 2021-05-29 MED ORDER — ONDANSETRON HCL 4 MG PO TABS: 4.0000 mg | ORAL_TABLET | Freq: Four times a day (QID) | ORAL | Status: DC | PRN

## 2021-05-29 MED ORDER — ACETAMINOPHEN 325 MG PO TABS
325.0000 mg | ORAL_TABLET | Freq: Four times a day (QID) | ORAL | Status: DC | PRN
Start: 2021-05-30 — End: 2021-05-30

## 2021-05-29 MED ORDER — FENTANYL CITRATE (PF) 100 MCG/2ML IJ SOLN
INTRAMUSCULAR | Status: AC
  Administered 2021-05-29: 25 ug via INTRAVENOUS
  Filled 2021-05-29: qty 2

## 2021-05-29 MED ORDER — METHOCARBAMOL 500 MG PO TABS
500.0000 mg | ORAL_TABLET | Freq: Four times a day (QID) | ORAL | Status: DC | PRN
  Administered 2021-05-29 – 2021-05-30 (×2): 500 mg via ORAL
  Filled 2021-05-29 (×2): qty 1

## 2021-05-29 MED ORDER — ZOLPIDEM TARTRATE 5 MG PO TABS: 5.0000 mg | ORAL_TABLET | Freq: Every evening | ORAL | Status: DC | PRN

## 2021-05-29 MED ORDER — TRAMADOL HCL 50 MG PO TABS
50.0000 mg | ORAL_TABLET | Freq: Four times a day (QID) | ORAL | Status: DC
Start: 2021-05-29 — End: 2021-05-30
  Administered 2021-05-29 – 2021-05-30 (×4): 50 mg via ORAL
  Filled 2021-05-29 (×4): qty 1

## 2021-05-29 MED ORDER — DOCUSATE SODIUM 100 MG PO CAPS
100.0000 mg | ORAL_CAPSULE | Freq: Two times a day (BID) | ORAL | Status: DC
  Administered 2021-05-29 – 2021-05-30 (×2): 100 mg via ORAL
  Filled 2021-05-29 (×2): qty 1

## 2021-05-29 MED ORDER — DEXAMETHASONE SODIUM PHOSPHATE 10 MG/ML IJ SOLN
INTRAMUSCULAR | Status: DC | PRN
  Administered 2021-05-29: 5 mg via INTRAVENOUS

## 2021-05-29 MED ORDER — BUPIVACAINE LIPOSOME 1.3 % IJ SUSP
INTRAMUSCULAR | Status: AC
  Filled 2021-05-29: qty 20

## 2021-05-29 MED ORDER — DIPHENHYDRAMINE HCL 12.5 MG/5ML PO ELIX: 12.5000 mg | ORAL_SOLUTION | ORAL | Status: DC | PRN

## 2021-05-29 MED ORDER — FAMOTIDINE 20 MG PO TABS
ORAL_TABLET | ORAL | Status: AC
  Filled 2021-05-29: qty 1

## 2021-05-29 MED ORDER — LACTATED RINGERS IV SOLN: INTRAVENOUS | Status: DC

## 2021-05-29 MED ORDER — PHENYLEPHRINE HCL (PRESSORS) 10 MG/ML IV SOLN
INTRAVENOUS | Status: DC | PRN
  Administered 2021-05-29 (×2): 120 ug via INTRAVENOUS

## 2021-05-29 MED ORDER — METOCLOPRAMIDE HCL 5 MG/ML IJ SOLN: 5.0000 mg | Freq: Three times a day (TID) | INTRAMUSCULAR | Status: DC | PRN

## 2021-05-29 MED ORDER — BUPIVACAINE HCL (PF) 0.5 % IJ SOLN
INTRAMUSCULAR | Status: DC | PRN
  Administered 2021-05-29: 3 mL

## 2021-05-29 MED ORDER — MAGNESIUM CITRATE PO SOLN
1.0000 | Freq: Once | ORAL | Status: DC | PRN
  Filled 2021-05-29: qty 296

## 2021-05-29 MED ORDER — FENTANYL CITRATE (PF) 100 MCG/2ML IJ SOLN
25.0000 ug | INTRAMUSCULAR | Status: DC | PRN
  Administered 2021-05-29 (×2): 25 ug via INTRAVENOUS

## 2021-05-29 MED ORDER — MENTHOL 3 MG MT LOZG
1.0000 | LOZENGE | OROMUCOSAL | Status: DC | PRN
  Filled 2021-05-29: qty 9

## 2021-05-29 MED ORDER — CHLORHEXIDINE GLUCONATE 0.12 % MT SOLN
15.0000 mL | Freq: Once | OROMUCOSAL | Status: AC
  Administered 2021-05-29: 15 mL via OROMUCOSAL

## 2021-05-29 MED ORDER — ALUM & MAG HYDROXIDE-SIMETH 200-200-20 MG/5ML PO SUSP: 30.0000 mL | ORAL | Status: DC | PRN

## 2021-05-29 MED ORDER — ONDANSETRON HCL 4 MG/2ML IJ SOLN: 4.0000 mg | Freq: Four times a day (QID) | INTRAMUSCULAR | Status: DC | PRN

## 2021-05-29 MED ORDER — MIDAZOLAM HCL 2 MG/2ML IJ SOLN
INTRAMUSCULAR | Status: AC
  Filled 2021-05-29: qty 2

## 2021-05-29 MED ORDER — BUPIVACAINE-EPINEPHRINE (PF) 0.25% -1:200000 IJ SOLN
INTRAMUSCULAR | Status: AC
  Filled 2021-05-29: qty 30

## 2021-05-29 MED ORDER — ORAL CARE MOUTH RINSE: 15.0000 mL | Freq: Once | OROMUCOSAL | Status: AC

## 2021-05-29 MED ORDER — CEFAZOLIN SODIUM-DEXTROSE 2-4 GM/100ML-% IV SOLN
2.0000 g | INTRAVENOUS | Status: AC
  Administered 2021-05-29: 2 g via INTRAVENOUS

## 2021-05-29 MED ORDER — EPHEDRINE 5 MG/ML INJ
INTRAVENOUS | Status: AC
  Filled 2021-05-29: qty 10

## 2021-05-29 MED ORDER — PROPOFOL 10 MG/ML IV BOLUS
INTRAVENOUS | Status: AC
  Filled 2021-05-29: qty 20

## 2021-05-29 MED ORDER — OXYCODONE HCL 5 MG PO TABS
5.0000 mg | ORAL_TABLET | ORAL | Status: DC | PRN
  Administered 2021-05-29: 10 mg via ORAL
  Administered 2021-05-30 (×2): 5 mg via ORAL
  Filled 2021-05-29: qty 2
  Filled 2021-05-29 (×2): qty 1

## 2021-05-29 MED ORDER — METOCLOPRAMIDE HCL 10 MG PO TABS: 5.0000 mg | ORAL_TABLET | Freq: Three times a day (TID) | ORAL | Status: DC | PRN

## 2021-05-29 MED ORDER — BISACODYL 10 MG RE SUPP: 10.0000 mg | Freq: Every day | RECTAL | Status: DC | PRN

## 2021-05-29 MED ORDER — OXYCODONE HCL 5 MG PO TABS
10.0000 mg | ORAL_TABLET | ORAL | Status: DC | PRN
  Administered 2021-05-30: 15 mg via ORAL
  Filled 2021-05-29: qty 3

## 2021-05-29 MED ORDER — PROPOFOL 500 MG/50ML IV EMUL
INTRAVENOUS | Status: DC | PRN
  Administered 2021-05-29: 100 ug/kg/min via INTRAVENOUS

## 2021-05-29 MED ORDER — FAMOTIDINE 20 MG PO TABS
20.0000 mg | ORAL_TABLET | Freq: Once | ORAL | Status: AC
  Administered 2021-05-29: 20 mg via ORAL

## 2021-05-29 MED ORDER — ONDANSETRON HCL 4 MG/2ML IJ SOLN
INTRAMUSCULAR | Status: DC | PRN
  Administered 2021-05-29: 4 mg via INTRAVENOUS

## 2021-05-29 MED ORDER — ONDANSETRON HCL 4 MG/2ML IJ SOLN
INTRAMUSCULAR | Status: AC
  Filled 2021-05-29: qty 2

## 2021-05-29 MED ORDER — CHLORHEXIDINE GLUCONATE 0.12 % MT SOLN
OROMUCOSAL | Status: AC
  Filled 2021-05-29: qty 15

## 2021-05-29 MED ORDER — NEOMYCIN-POLYMYXIN B GU 40-200000 IR SOLN
Status: AC
  Filled 2021-05-29: qty 4

## 2021-05-29 MED ORDER — BUPIVACAINE-EPINEPHRINE 0.25% -1:200000 IJ SOLN
INTRAMUSCULAR | Status: DC | PRN
  Administered 2021-05-29: 30 mL

## 2021-05-29 MED ORDER — APIXABAN 2.5 MG PO TABS
2.5000 mg | ORAL_TABLET | Freq: Two times a day (BID) | ORAL | Status: DC
  Administered 2021-05-30: 2.5 mg via ORAL
  Filled 2021-05-29: qty 1

## 2021-05-29 MED ORDER — POLYETHYLENE GLYCOL 3350 17 G PO PACK: 17.0000 g | PACK | Freq: Every day | ORAL | Status: DC | PRN

## 2021-05-29 MED ORDER — ONDANSETRON HCL 4 MG/2ML IJ SOLN
4.0000 mg | Freq: Once | INTRAMUSCULAR | Status: AC | PRN
  Administered 2021-05-29: 4 mg via INTRAVENOUS

## 2021-05-29 MED ORDER — GABAPENTIN 400 MG PO CAPS
800.0000 mg | ORAL_CAPSULE | Freq: Two times a day (BID) | ORAL | Status: DC
  Administered 2021-05-29 – 2021-05-30 (×2): 800 mg via ORAL
  Filled 2021-05-29 (×2): qty 2

## 2021-05-29 MED ORDER — SODIUM CHLORIDE FLUSH 0.9 % IV SOLN
INTRAVENOUS | Status: AC
  Filled 2021-05-29: qty 40

## 2021-05-29 MED ORDER — CITALOPRAM HYDROBROMIDE 20 MG PO TABS
40.0000 mg | ORAL_TABLET | Freq: Every day | ORAL | Status: DC
  Administered 2021-05-30: 40 mg via ORAL
  Filled 2021-05-29: qty 2

## 2021-05-29 MED ORDER — SODIUM CHLORIDE 0.9 % IV SOLN: INTRAVENOUS | Status: DC

## 2021-05-29 MED ORDER — PHENOL 1.4 % MT LIQD
1.0000 | OROMUCOSAL | Status: DC | PRN
  Filled 2021-05-29: qty 177

## 2021-05-29 MED ORDER — BREXPIPRAZOLE 1 MG PO TABS
1.0000 mg | ORAL_TABLET | Freq: Every day | ORAL | Status: DC
  Administered 2021-05-29 – 2021-05-30 (×2): 1 mg via ORAL
  Filled 2021-05-29 (×2): qty 1

## 2021-05-29 MED ORDER — METHOCARBAMOL 1000 MG/10ML IJ SOLN
500.0000 mg | Freq: Four times a day (QID) | INTRAMUSCULAR | Status: DC | PRN
  Filled 2021-05-29: qty 5

## 2021-05-29 MED ORDER — CEFAZOLIN SODIUM-DEXTROSE 2-4 GM/100ML-% IV SOLN
2.0000 g | Freq: Four times a day (QID) | INTRAVENOUS | Status: AC
  Administered 2021-05-29 (×2): 2 g via INTRAVENOUS
  Filled 2021-05-29 (×2): qty 100

## 2021-05-29 MED ORDER — HYDROMORPHONE HCL 1 MG/ML IJ SOLN
0.5000 mg | INTRAMUSCULAR | Status: DC | PRN
  Administered 2021-05-29 – 2021-05-30 (×3): 1 mg via INTRAVENOUS
  Filled 2021-05-29 (×3): qty 1

## 2021-05-29 SURGICAL SUPPLY — 61 items
BLADE SAGITTAL AGGR TOOTH XLG (BLADE) ×2 IMPLANT
BNDG COHESIVE 6X5 TAN STRL LF (GAUZE/BANDAGES/DRESSINGS) ×6 IMPLANT
CANISTER SUCT 1200ML W/VALVE (MISCELLANEOUS) ×2 IMPLANT
CANISTER WOUND CARE 500ML ATS (WOUND CARE) ×2 IMPLANT
CHLORAPREP W/TINT 26 (MISCELLANEOUS) ×2 IMPLANT
COVER BACK TABLE REUSABLE LG (DRAPES) ×2 IMPLANT
COVER WAND RF STERILE (DRAPES) ×2 IMPLANT
DRAPE 3/4 80X56 (DRAPES) ×6 IMPLANT
DRAPE C-ARM XRAY 36X54 (DRAPES) ×2 IMPLANT
DRAPE INCISE IOBAN 66X60 STRL (DRAPES) IMPLANT
DRAPE POUCH INSTRU U-SHP 10X18 (DRAPES) ×2 IMPLANT
DRESSING SURGICEL FIBRLLR 1X2 (HEMOSTASIS) ×2 IMPLANT
DRSG MEPILEX SACRM 8.7X9.8 (GAUZE/BANDAGES/DRESSINGS) ×2 IMPLANT
DRSG OPSITE POSTOP 4X8 (GAUZE/BANDAGES/DRESSINGS) ×4 IMPLANT
DRSG SURGICEL FIBRILLAR 1X2 (HEMOSTASIS) ×4
ELECT BLADE 6.5 EXT (BLADE) ×2 IMPLANT
ELECT REM PT RETURN 9FT ADLT (ELECTROSURGICAL) ×2
ELECTRODE REM PT RTRN 9FT ADLT (ELECTROSURGICAL) ×1 IMPLANT
GLOVE SURG SYN 9.0  PF PI (GLOVE) ×2
GLOVE SURG SYN 9.0 PF PI (GLOVE) ×2 IMPLANT
GLOVE SURG UNDER POLY LF SZ9 (GLOVE) ×2 IMPLANT
GOWN SRG 2XL LVL 4 RGLN SLV (GOWNS) ×1 IMPLANT
GOWN STRL NON-REIN 2XL LVL4 (GOWNS) ×1
GOWN STRL REUS W/ TWL LRG LVL3 (GOWN DISPOSABLE) ×1 IMPLANT
GOWN STRL REUS W/TWL LRG LVL3 (GOWN DISPOSABLE) ×1
HEAD FEMORAL SZ28 LGE BIOLOX (Head) ×2 IMPLANT
HEMOVAC 400CC 10FR (MISCELLANEOUS) IMPLANT
HOLDER FOLEY CATH W/STRAP (MISCELLANEOUS) ×2 IMPLANT
HOOD PEEL AWAY FLYTE STAYCOOL (MISCELLANEOUS) ×2 IMPLANT
IRRIGATION SURGIPHOR STRL (IV SOLUTION) IMPLANT
KIT PREVENA INCISION MGT 13 (CANNISTER) ×2 IMPLANT
LINER DUAL MOB 50MM (Liner) ×2 IMPLANT
MANIFOLD NEPTUNE II (INSTRUMENTS) ×2 IMPLANT
MASTERLOC HIP STD S6 (Hips) ×2 IMPLANT
MAT ABSORB  FLUID 56X50 GRAY (MISCELLANEOUS) ×1
MAT ABSORB FLUID 56X50 GRAY (MISCELLANEOUS) ×1 IMPLANT
NDL SAFETY ECLIPSE 18X1.5 (NEEDLE) ×1 IMPLANT
NEEDLE HYPO 18GX1.5 SHARP (NEEDLE) ×1
NEEDLE SPNL 20GX3.5 QUINCKE YW (NEEDLE) ×4 IMPLANT
NS IRRIG 1000ML POUR BTL (IV SOLUTION) ×2 IMPLANT
PACK HIP COMPR (MISCELLANEOUS) ×2 IMPLANT
SCALPEL PROTECTED #10 DISP (BLADE) ×4 IMPLANT
SHELL ACETABULAR SZ0 50 DME (Shell) ×2 IMPLANT
SOL PREP PVP 2OZ (MISCELLANEOUS) ×2
SOLUTION PREP PVP 2OZ (MISCELLANEOUS) ×1 IMPLANT
SPONGE DRAIN TRACH 4X4 STRL 2S (GAUZE/BANDAGES/DRESSINGS) ×2 IMPLANT
STAPLER SKIN PROX 35W (STAPLE) ×2 IMPLANT
STRAP SAFETY 5IN WIDE (MISCELLANEOUS) ×2 IMPLANT
SUT DVC 2 QUILL PDO  T11 36X36 (SUTURE) ×1
SUT DVC 2 QUILL PDO T11 36X36 (SUTURE) ×1 IMPLANT
SUT SILK 0 (SUTURE) ×1
SUT SILK 0 30XBRD TIE 6 (SUTURE) ×1 IMPLANT
SUT V-LOC 90 ABS DVC 3-0 CL (SUTURE) ×2 IMPLANT
SUT VIC AB 1 CT1 36 (SUTURE) ×2 IMPLANT
SYR 20ML LL LF (SYRINGE) ×2 IMPLANT
SYR 30ML LL (SYRINGE) ×2 IMPLANT
SYR 50ML LL SCALE MARK (SYRINGE) ×4 IMPLANT
SYR BULB IRRIG 60ML STRL (SYRINGE) ×2 IMPLANT
TAPE MICROFOAM 4IN (TAPE) ×2 IMPLANT
TOWEL OR 17X26 4PK STRL BLUE (TOWEL DISPOSABLE) ×2 IMPLANT
TRAY FOLEY MTR SLVR 16FR STAT (SET/KITS/TRAYS/PACK) ×2 IMPLANT
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
## 2021-05-29 NOTE — Anesthesia Preprocedure Evaluation (Signed)
Anesthesia Evaluation  Patient identified by MRN, date of birth, ID band Patient awake    Reviewed: Allergy & Precautions, NPO status , Patient's Chart, lab work & pertinent test results  History of Anesthesia Complications Negative for: history of anesthetic complications  Airway Mallampati: II       Dental   Pulmonary neg sleep apnea, neg COPD, Not current smoker,           Cardiovascular hypertension (not currently sing meds),      Neuro/Psych neg Seizures Anxiety Depression    GI/Hepatic Neg liver ROS, neg GERD  ,  Endo/Other  neg diabetes  Renal/GU negative Renal ROS     Musculoskeletal   Abdominal   Peds  Hematology   Anesthesia Other Findings   Reproductive/Obstetrics                             Anesthesia Physical Anesthesia Plan  ASA: II  Anesthesia Plan: Spinal   Post-op Pain Management:    Induction: Intravenous  PONV Risk Score and Plan:   Airway Management Planned:   Additional Equipment:   Intra-op Plan:   Post-operative Plan:   Informed Consent: I have reviewed the patients History and Physical, chart, labs and discussed the procedure including the risks, benefits and alternatives for the proposed anesthesia with the patient or authorized representative who has indicated his/her understanding and acceptance.       Plan Discussed with:   Anesthesia Plan Comments:         Anesthesia Quick Evaluation

## 2021-05-29 NOTE — Op Note (Signed)
05/29/2021  2:05 PM  PATIENT:  Jamie [name]  55 y.o. female  PRE-OPERATIVE DIAGNOSIS:  Right hip pain M25.551 Primary osteoarthritis of right hip M16.11  POST-OPERATIVE DIAGNOSIS:  Right hip pain M25.551 Primary osteoarthritis of right hip M16.11  PROCEDURE:  Procedure(s): TOTAL HIP ARTHROPLASTY ANTERIOR APPROACH (Right)  SURGEON: Laurene Footman, MD  ASSISTANTS: None  ANESTHESIA:   spinal  EBL:  Total I/O In: 800 [I.V.:700; IV Piggyback:100] Out: 250 [Urine:50; Blood:200]  BLOOD ADMINISTERED:none  DRAINS: Incisional wound VAC   LOCAL MEDICATIONS USED:  MARCAINE    and OTHER Exparel  SPECIMEN:  Source of Specimen:  Right femoral head  DISPOSITION OF SPECIMEN:  PATHOLOGY  COUNTS:  YES  TOURNIQUET:  * No tourniquets in log *  IMPLANTS: Medacta Master lock 6 standard stem with 50 millimeter Mpact DM cup and liner with ceramic L 28 mm femoral head  DICTATION: .Dragon Dictation   The patient was brought to the operating room and after spinal anesthesia was obtained patient was placed on the operative table with the ipsilateral foot into the Medacta attachment, contralateral leg on a well-padded table. C-arm was brought in and preop template x-ray taken. After prepping and draping in usual sterile fashion appropriate patient identification and timeout procedures were completed. Anterior approach to the hip was obtained and centered over the greater trochanter and TFL muscle. The subcutaneous tissue was incised hemostasis being achieved by electrocautery. TFL fascia was incised and the muscle retracted laterally deep retractor placed. The lateral femoral circumflex vessels were identified and ligated. The anterior capsule was exposed and a capsulotomy performed. The neck was identified and a femoral neck cut carried out with a saw. The head was removed without difficulty and showed sclerotic femoral head and acetabulum. Reaming was carried out to 50 mm and a 50 mm cup trial gave  appropriate tightness to the acetabular component a 50 DM cup was impacted into position. The leg was then externally rotated and ischiofemoral and pubofemoral releases carried out. The femur was sequentially broached to a size 6, size 6 standard with S and then L heads trials were placed and the final components chosen. The 6 standard stem was inserted along with a ceramic L 28 mm head and 50 mm liner. The hip was reduced and was stable the wound was thoroughly irrigated with fibrillar placed along the posterior capsule and medial neck. The deep fascia ws closed using a heavy Quill after infiltration of 30 cc of quarter percent Sensorcaine with epinephrine diluted with Exparel throughout the case .3-0 V-loc to close the skin with skin staples.  Incisional wound VAC applied and  patient was sent to recovery in stable condition.   PLAN OF CARE: Admit for overnight observation

## 2021-05-29 NOTE — Progress Notes (Signed)
PT Cancellation Note  Patient Details Name: Jamie [name] MRN: [id number] DOB: [birth year]   Cancelled Treatment:    Reason Eval/Treat Not Completed:  (Consult received and chart reviewed. Patient still with significant numbness in bilat LEs; very limited active movement at this time.  Will hold evaluation at this time and re-attempt at later time/date as medically appropriate.)   Lilliahna Schubring H. Owens Shark, PT, DPT, NCS 05/29/21, 3:54 PM [phone]

## 2021-05-29 NOTE — Transfer of Care (Signed)
Immediate Anesthesia Transfer of Care Note  Patient: Jamie [name]  Procedure(s) Performed: TOTAL HIP ARTHROPLASTY ANTERIOR APPROACH (Right Hip)  Patient Location: PACU  Anesthesia Type:Spinal  Level of Consciousness: drowsy and patient cooperative  Airway & Oxygen Therapy: Patient Spontanous Breathing  Post-op Assessment: Report given to RN and Post -op Vital signs reviewed and stable  Post vital signs: Reviewed and stable  Last Vitals:  Vitals Value Taken Time  BP 124/79 05/29/21 1400  Temp 36.6 C 05/29/21 1400  Pulse 70 05/29/21 1403  Resp 18 05/29/21 1403  SpO2 98 % 05/29/21 1403  Vitals shown include unvalidated device data.  Last Pain:  Vitals:   05/29/21 1050  TempSrc: Oral  PainSc: 10-Worst pain ever      Patients Stated Pain Goal: 0 (88/71/95 9747)  Complications: No complications documented.

## 2021-05-29 NOTE — Anesthesia Procedure Notes (Signed)
Spinal  Patient location during procedure: OR Start time: 05/29/2021 12:12 PM End time: 05/29/2021 12:28 PM Reason for block: surgical anesthesia Staffing Performed: other anesthesia staff  Anesthesiologist: Gunnar Fusi, MD Other anesthesia staff: Bea Graff, RN Preanesthetic Checklist Completed: patient identified, IV checked, site marked, risks and benefits discussed, surgical consent, monitors and equipment checked, pre-op evaluation and timeout performed Spinal Block Patient position: sitting Prep: DuraPrep Patient monitoring: heart rate, cardiac monitor, continuous pulse ox and blood pressure Approach: midline Location: L3-4 Injection technique: single-shot Needle Needle type: Pencan  Needle gauge: 24 G Needle length: 9 cm Assessment Sensory level: T4 Events: CSF return

## 2021-05-30 ENCOUNTER — Encounter: Payer: Self-pay | Admitting: Orthopedic Surgery

## 2021-05-30 LAB — BASIC METABOLIC PANEL
Anion gap: 8 (ref 5–15)
BUN: 16 mg/dL (ref 6–20)
CO2: 24 mmol/L (ref 22–32)
Calcium: 8.6 mg/dL — ABNORMAL LOW (ref 8.9–10.3)
Chloride: 105 mmol/L (ref 98–111)
Creatinine, Ser: 0.6 mg/dL (ref 0.44–1.00)
GFR, Estimated: >60 mL/min (ref >60)
Glucose, Bld: 157 mg/dL — ABNORMAL HIGH (ref 70–99)
Potassium: 3.6 mmol/L (ref 3.5–5.1)
Sodium: 137 mmol/L (ref 135–145)

## 2021-05-30 LAB — BPAM RBC
Blood Product Expiration Date: 202206222359
Blood Product Expiration Date: 202206222359
Unit Type and Rh: 9500
Unit Type and Rh: 9500

## 2021-05-30 LAB — TYPE AND SCREEN
ABO/RH(D): A NEG
Antibody Screen: NEGATIVE
PT AG Type: POSITIVE
Unit division: 0
Unit division: 0

## 2021-05-30 LAB — CBC
HCT: 33.7 % — ABNORMAL LOW (ref 36.0–46.0)
Hemoglobin: 11.6 g/dL — ABNORMAL LOW (ref 12.0–15.0)
MCH: 30.9 pg (ref 26.0–34.0)
MCHC: 34.4 g/dL (ref 30.0–36.0)
MCV: 89.9 fL (ref 80.0–100.0)
Platelets: 265 10*3/uL (ref 150–400)
RBC: 3.75 MIL/uL — ABNORMAL LOW (ref 3.87–5.11)
RDW: 12.3 % (ref 11.5–15.5)
WBC: 15.3 10*3/uL — ABNORMAL HIGH (ref 4.0–10.5)
nRBC: 0 % (ref 0.0–0.2)

## 2021-05-30 MED ORDER — TRAMADOL HCL 50 MG PO TABS: 50.0000 mg | ORAL_TABLET | Freq: Four times a day (QID) | ORAL | 0 refills | Status: DC | PRN

## 2021-05-30 MED ORDER — METHOCARBAMOL 500 MG PO TABS: 500.0000 mg | ORAL_TABLET | Freq: Four times a day (QID) | ORAL | 0 refills | Status: DC | PRN

## 2021-05-30 MED ORDER — DOCUSATE SODIUM 100 MG PO CAPS: 100.0000 mg | ORAL_CAPSULE | Freq: Two times a day (BID) | ORAL | 0 refills | Status: DC

## 2021-05-30 MED ORDER — APIXABAN 2.5 MG PO TABS: 2.5000 mg | ORAL_TABLET | Freq: Two times a day (BID) | ORAL | 3 refills | Status: DC

## 2021-05-30 MED ORDER — BREXPIPRAZOLE 1 MG PO TABS: 1.0000 mg | ORAL_TABLET | Freq: Every day | ORAL | 0 refills | Status: DC

## 2021-05-30 MED ORDER — OXYCODONE HCL 5 MG PO TABS: 5.0000 mg | ORAL_TABLET | ORAL | 0 refills | Status: DC | PRN

## 2021-05-30 NOTE — TOC Progression Note (Signed)
Transition of Care [facility]) - Progression Note    Patient Details  Name: Jamie [name] MRN: [id number] Date of Birth: December 17, 1966  Transition of Care [facility]) CM/SW Justice, RN Phone Number: 05/30/2021, 12:08 PM  Clinical Narrative:   Met with the patient to discuss DC plan an needs She lives at home with her husband and children, she has transportation, she needs a Rolling walker, [employer] will bring to the room prior to DC, She is set up with [facility], she stated she has some issues affording her meds with her insurance, I encouraged her to look at the manufacturer and [facility] [employer] , she stated her doctors have mentioned that in the past She will check into it, no immediate needs for med assistance,            Expected Discharge Plan and Services                                                 Social Determinants of Health (SDOH) Interventions    Readmission Risk Interventions No flowsheet data found.

## 2021-05-30 NOTE — Progress Notes (Signed)
Physical Therapy Treatment Patient Details Name: Jamie [name] MRN: [id number] DOB: 19-Nov-1966 Today's Date: 05/30/2021    History of Present Illness admitted for acute hospitalization s/p R THR, anterior approach, WBAT (05/29/21)    PT Comments    Progressive increase in gait distance (50' x1, 100' x1); able to initiate stair training, completing (up/down 4, single rail) with cga assist.   Remains slow and guarded in all movement transitions and functional activities, but safe and functional for discharge home. All information, current status and level of assist required reviewed with patient and husband; both voiced understanding.  Comfortable with upcoming discharge.  No additional questions at this time.    Follow Up Recommendations  Home health PT     Equipment Recommendations  Rolling walker with 5" wheels    Recommendations for Other Services       Precautions / Restrictions Precautions Precautions: Fall;Anterior Hip Restrictions Weight Bearing Restrictions: Yes RLE Weight Bearing: Weight bearing as tolerated    Mobility  Bed Mobility Overal bed mobility: Needs Assistance Bed Mobility: Sit to Supine       Sit to supine: Supervision   General bed mobility comments: educated in hook-technique to assist with R LE elevation over edge of bed    Transfers Overall transfer level: Needs assistance Equipment used: Rolling walker (2 wheeled) Transfers: Sit to/from Stand Sit to Stand: Supervision         General transfer comment: continued tendancy to pull on RW; cuing for improved hand placement  Ambulation/Gait Ambulation/Gait assistance: Supervision Gait Distance (Feet):  (100' x1, 50' x1) Assistive device: Rolling walker (2 wheeled)       General Gait Details: progressing towards partially reciprocal stepping pattern; exaggerated R hip flexion to initiate R LE advancement. Remains slow and effortful; single episode of R LE buckling (able to  self-correct).   Stairs Stairs: Yes Stairs assistance: Min guard Stair Management: One rail Left Number of Stairs: 4 General stair comments: step to gait pattern, min cuing for technique; husband present to observe, voices understanding/comfort of technique and level of assist required   Wheelchair Mobility    Modified Rankin (Stroke Patients Only)       Balance Overall balance assessment: Needs assistance Sitting-balance support: No upper extremity supported;Feet supported Sitting balance-Leahy Scale: Good Sitting balance - Comments: Good sitting balance reading within BOS   Standing balance support: Bilateral upper extremity supported Standing balance-Leahy Scale: Fair Standing balance comment: CGA for x1 knee buckle during functional mobility                            Cognition Arousal/Alertness: Awake/alert Behavior During Therapy: WFL for tasks assessed/performed Overall Cognitive Status: Within Functional Limits for tasks assessed                                        Exercises Other Exercises Other Exercises: Verbally reviewed technique for car transfers; reviewed R LE WBing and movement precautions; issued handout with written/pictorial descriptions of supine LE therex for use as HEP.  Patient voiced understanding and agreement of all information.    General Comments        Pertinent Vitals/Pain Pain Assessment: Faces Faces Pain Scale: Hurts even more Pain Location: R hip Pain Descriptors / Indicators: Aching;Grimacing;Guarding;Burning Pain Intervention(s): Limited activity within patient's tolerance;Monitored during session;Repositioned;Premedicated before session    Home Living Family/patient  expects to be discharged to:: Private residence Living Arrangements: Spouse/significant other;Children Available Help at Discharge: Family;Available 24 hours/day Type of Home: House Home Access: Stairs to enter   Home Layout: One  level Home Equipment: Environmental consultant - 4 wheels;Bedside commode      Prior Function Level of Independence: Independent      Comments: Indep with ADLs, household and community mobilization without assist device; intermittent use of 4WRW (for longer distances) in recent weeks due to R hip pain. Reports 3 falls in past six months, attributing to knee buckling   PT Goals (current goals can now be found in the care plan section) Acute Rehab PT Goals Patient Stated Goal: to return home PT Goal Formulation: With patient Time For Goal Achievement: 06/13/21 Potential to Achieve Goals: Good Progress towards PT goals: Progressing toward goals    Frequency    BID      PT Plan Current plan remains appropriate    Co-evaluation              AM-PAC PT "6 Clicks" Mobility   Outcome Measure  Help needed turning from your back to your side while in a flat bed without using bedrails?: None Help needed moving from lying on your back to sitting on the side of a flat bed without using bedrails?: None Help needed moving to and from a bed to a chair (including a wheelchair)?: None Help needed standing up from a chair using your arms (e.g., wheelchair or bedside chair)?: None Help needed to walk in hospital room?: A Little Help needed climbing 3-5 steps with a railing? : A Little 6 Click Score: 22    End of Session Equipment Utilized During Treatment: Gait belt Activity Tolerance: Patient tolerated treatment well Patient left: with call bell/phone within reach;in bed;with bed alarm set Nurse Communication: Mobility status PT Visit Diagnosis: Muscle weakness (generalized) (M62.81);Difficulty in walking, not elsewhere classified (R26.2)     Time: 1448-1856 PT Time Calculation (min) (ACUTE ONLY): 25 min  Charges:  $Gait Training: 8-22 mins $Therapeutic Activity: 8-22 mins            Advit Trethewey H. Owens Shark, PT, DPT, NCS 05/30/21, 4:32 PM [phone]

## 2021-05-30 NOTE — Discharge Instructions (Signed)

## 2021-05-30 NOTE — Discharge Summary (Signed)
Physician Discharge Summary  Patient ID: Jamie [name] MRN: [id number] DOB/AGE: [birth year] 55 y.o.  Admit date: 05/29/2021 Discharge date: 05/30/2021   Admission Diagnoses:  S/P hip replacement [Z96.649]   Discharge Diagnoses: Patient Active Problem List   Diagnosis Date Noted  . S/P hip replacement 05/29/2021  . Osteoarthritis of right hip 05/18/2021  . Insomnia due to medical condition 02/23/2021  . Noncompliance with treatment plan 10/20/2020  . At risk for prolonged QT interval syndrome 08/01/2020  . MDD (major depressive disorder), recurrent episode, moderate ([place]) 08/25/2019  . GAD (generalized anxiety disorder) 08/25/2019  . Insomnia due to mental condition 08/25/2019  . Controlled substance agreement broken 10/12/2016  . Neuropathy, arm, left 10/11/2016  . Medication monitoring encounter 07/11/2016  . Deep vein thrombosis (DVT) of left upper extremity ([place]) 06/26/2016  . Pain in left arm 06/26/2016  . Splenic infarct 06/26/2016  . Abdominal pain, chronic, left upper quadrant 09/26/2015  . Leg pain, anterior 08/18/2015  . Chronic neck and back pain 06/02/2015  . Hypertension goal BP (blood pressure) < 140/90 06/02/2015  . Major depression in remission ([place]) 06/02/2015  . Obesity, Class I, BMI 30-34.9 06/02/2015  . Embolism of splenic artery ([place]) 09/24/2010  . Chronic pain not due to malignancy 09/24/2010  . History of spleen injury 09/24/2010    Past Medical History:  Diagnosis Date  . Anxiety   . Brachial neuritis   . Cervical cancer ([place])   . Chronic neck pain   . Chronic pain not due to malignancy   . Depressive disorder    followed by Dr. Alethia Berthold.  . Disturbance, sleep   . DVT (deep venous thrombosis) (HCC)    left arm  . Elevated blood pressure reading without diagnosis of hypertension   . Embolism and thrombosis of splenic artery   . H/O thrombophlebitis   . Infarction of spleen   . Lumbosacral neuritis   . Obesity, Class II, BMI 35-39.9,  with comorbidity      Transfusion: none   Consultants (if any):   Discharged Condition: Improved  Hospital Course: Jamie [name] is an 55 y.o. female who was admitted 05/29/2021 with a diagnosis of right hip osteoarthritis and went to the operating room on 05/29/2021 and underwent the above named procedures.    Surgeries: Procedure(s): TOTAL HIP ARTHROPLASTY ANTERIOR APPROACH on 05/29/2021 Patient tolerated the surgery well. Taken to PACU where she was stabilized and then transferred to the orthopedic floor.  Started on Eliquis. TEDs SCDs applied bilaterally at 80 mm. Heels elevated on bed with rolled towels. No evidence of DVT. Negative [place]. Physical therapy started on day #1 for gait training and transfer. OT started day #1 for ADL and assisted devices.  Patient's foley was d/c on day #1. Patient's IV and hemovac was d/c on day #1.  On post op day #1 patient was stable and ready for discharge to home with HHPT.  Implants: Medacta Master lock 6 standard stem with 50 millimeter Mpact DM cup and liner with ceramic L 28 mm femoral head  She was given perioperative antibiotics:  Anti-infectives (From admission, onward)   Start     Dose/Rate Route Frequency Ordered Stop   05/29/21 1800  ceFAZolin (ANCEF) IVPB 2g/100 mL premix        2 g 200 mL/hr over 30 Minutes Intravenous Every 6 hours 05/29/21 1531 05/30/21 0023   05/29/21 1117  ceFAZolin (ANCEF) 2-4 GM/100ML-% IVPB       Note to Pharmacy: Trudie Reed   :  cabinet override      05/29/21 1117 05/29/21 1231   05/29/21 0600  ceFAZolin (ANCEF) IVPB 2g/100 mL premix        2 g 200 mL/hr over 30 Minutes Intravenous On call to O.R. 05/29/21 0216 05/29/21 1246    .  She was given sequential compression devices, early ambulation, and ELiquis  for DVT prophylaxis.  She benefited maximally from the hospital stay and there were no complications.    Recent vital signs:  Vitals:   05/30/21 0340 05/30/21 0718  BP: 116/73 123/73  Pulse:  80 72  Resp: 20   Temp: 98.8 F (37.1 C)   SpO2: 98%     Recent laboratory studies:  Lab Results  Component Value Date   HGB 11.6 (L) 05/30/2021   HGB 14.2 05/21/2021   HGB 12.3 07/11/2016   Lab Results  Component Value Date   WBC 15.3 (H) 05/30/2021   PLT 265 05/30/2021   No results found for: INR Lab Results  Component Value Date   NA 137 05/30/2021   K 3.6 05/30/2021   CL 105 05/30/2021   CO2 24 05/30/2021   BUN 16 05/30/2021   CREATININE 0.60 05/30/2021   GLUCOSE 157 (H) 05/30/2021    Discharge Medications:   Allergies as of 05/30/2021      Reactions   Neuromuscular Blocking Agents Other (See Comments)   Don't work   Hydrocodone-acetaminophen Nausea Only      Medication List    STOP taking these medications   celecoxib 200 MG capsule Commonly known as: CELEBREX     TAKE these medications   amitriptyline 10 MG tablet Commonly known as: ELAVIL Take 1-2 tablets (10-20 mg total) by mouth at bedtime as needed for sleep.   apixaban 2.5 MG Tabs tablet Commonly known as: ELIQUIS Take 1 tablet (2.5 mg total) by mouth every 12 (twelve) hours.   brexpiprazole 1 MG Tabs tablet Commonly known as: Rexulti Take 1 tablet (1 mg total) by mouth daily.   citalopram 40 MG tablet Commonly known as: CELEXA Take 1 tablet (40 mg total) by mouth daily.   docusate sodium 100 MG capsule Commonly known as: COLACE Take 1 capsule (100 mg total) by mouth 2 (two) times daily.   gabapentin 800 MG tablet Commonly known as: NEURONTIN Take 1 tablet by mouth twice daily   methocarbamol 500 MG tablet Commonly known as: ROBAXIN Take 1 tablet (500 mg total) by mouth every 6 (six) hours as needed for muscle spasms.   oxyCODONE 5 MG immediate release tablet Commonly known as: Oxy IR/ROXICODONE Take 1-2 tablets (5-10 mg total) by mouth every 4 (four) hours as needed for moderate pain (pain score 4-6).   propranolol 10 MG tablet Commonly known as: INDERAL Take 1 tablet twice a  day as needed for breakthrough anxiety   traMADol 50 MG tablet Commonly known as: ULTRAM Take 1 tablet (50 mg total) by mouth every 6 (six) hours as needed. What changed:   how much to take  when to take this  reasons to take this            Durable Medical Equipment  (From admission, onward)         Start     Ordered   05/29/21 1532  DME Walker rolling  Once       Question Answer Comment  Walker: With [place] Wheels   Patient needs a walker to treat with the following condition S/P hip replacement  05/29/21 1531   05/29/21 1532  DME 3 n 1  Once        05/29/21 1531   05/29/21 1532  DME Bedside commode  Once       Question:  Patient needs a bedside commode to treat with the following condition  Answer:  S/P hip replacement   05/29/21 1531          Diagnostic Studies: PERIPHERAL VASCULAR CATHETERIZATION  Result Date: 05/24/2021 See op note  DG HIP OPERATIVE UNILAT W OR W/O PELVIS RIGHT  Result Date: 05/29/2021 CLINICAL DATA:  Hip replacement. EXAM: OPERATIVE RIGHT HIP (WITH PELVIS IF PERFORMED) 3 VIEWS TECHNIQUE: Fluoroscopic spot image(s) were submitted for interpretation post-operatively. COMPARISON:  None. FINDINGS: Three intraoperative spot fluoroscopic images demonstrate performance of a right total hip arthroplasty. No fracture is identified on these limited images. IMPRESSION: Intraoperative images during right hip arthroplasty. Electronically Signed   By: Logan Bores M.D.   On: 05/29/2021 14:16   DG HIP UNILAT W OR W/O PELVIS 2-3 VIEWS RIGHT  Result Date: 05/29/2021 CLINICAL DATA:  Status post total hip replacement. EXAM: DG HIP (WITH OR WITHOUT PELVIS) 2-3V RIGHT COMPARISON:  05/29/2021. FINDINGS: Total right hip replacement. Hardware intact. Anatomic alignment. Surgical clip scratched it surgical staples and drainage catheter noted over the right hip. IMPRESSION: Total right hip replacement with anatomic alignment. Electronically Signed   By: Marcello Moores   Register   On: 05/29/2021 14:39    Disposition:  There are no questions and answers to display.           Follow-up Information    Duanne Guess, PA-C Follow up in 2 week(s).   Specialties: Orthopedic Surgery, Emergency Medicine Contact information: [place] [address] [phone]                Signed: Feliberto Gottron 05/30/2021, 7:45 AM

## 2021-05-30 NOTE — Evaluation (Signed)
Physical Therapy Evaluation Patient Details Name: Jamie [name] MRN: [id number] DOB: [birth year] Today's Date: 05/30/2021   History of Present Illness  admitted for acute hospitalization s/p R THR, anterior approach, WBAT (05/29/21)  Clinical Impression  Upon evaluation, patient alert and oriented; follows commands and agreeable to participation with session.  Slightly anxious, but responds well to encouragement.  Rates R hip pain 6/10; meds received prior to session.  Fair post-op strength (2+ to 3-/5) and ROM; generally cautious and guarded in all movement patterns.  Able to copmplete sit/stand, basic transfers and gait (46') with RW, cga/min assist.  Demonstrates mixed step to/step through gait pattern with fair loading/stance time R LE; min cuing for R TKE in stance to optimize stability.  Very slow and guarded; mod WBing bilat UEs.  Distance limited by generalized fatigue.  Will continue to assess/progress in subsequent sessions; plan to initiate longer-distance gait and stairs in PM session. Would benefit from skilled PT to address above deficits and promote optimal return to PLOF.; Recommend transition to HHPT upon discharge from acute hospitalization.     Follow Up Recommendations Home health PT    Equipment Recommendations  Rolling walker with 5" wheels    Recommendations for Other Services       Precautions / Restrictions Precautions Precautions: Fall;Anterior Hip Restrictions Weight Bearing Restrictions: Yes RLE Weight Bearing: Weight bearing as tolerated      Mobility  Bed Mobility               General bed mobility comments: seated in recliner beginning/end of treatment session    Transfers Overall transfer level: Needs assistance Equipment used: Rolling walker (2 wheeled) Transfers: Sit to/from Stand Sit to Stand: Min guard;Supervision         General transfer comment: very slow and deliberate; min cuing for hand  placement  Ambulation/Gait Ambulation/Gait assistance: Min guard Gait Distance (Feet): 50 Feet Assistive device: Rolling walker (2 wheeled)       General Gait Details: mixed step to/step through gait pattern with fair loading/stance time R LE; min cuing for R TKE in stance to optimize stability.  Very slow and guarded; mod WBing bilat UEs.  Distance limited by generalized fatigue.  Stairs            Wheelchair Mobility    Modified Rankin (Stroke Patients Only)       Balance Overall balance assessment: Needs assistance Sitting-balance support: No upper extremity supported;Feet supported Sitting balance-Leahy Scale: Good     Standing balance support: Bilateral upper extremity supported Standing balance-Leahy Scale: Fair                               Pertinent Vitals/Pain Pain Assessment: 0-10 Pain Score: 6  Pain Location: R hip Pain Descriptors / Indicators: Aching;Grimacing;Guarding Pain Intervention(s): Limited activity within patient's tolerance;Monitored during session;Premedicated before session;Repositioned    Home Living Family/patient expects to be discharged to:: Private residence Living Arrangements: Spouse/significant other;Children Available Help at Discharge: Family;Available 24 hours/day Type of Home: House Home Access: Stairs to enter   [employer] of Steps: 11 (R rail) to porch in front; 2-3 (L rail) in back (has to ambulate up hill to access) Home Layout: One level Home Equipment: Walker - 4 wheels;Bedside commode      Prior Function Level of Independence: Independent         Comments: Indep with ADLs, household and community mobilization without assist device; intermittent use of  4MWN (for longer distances) in recent weeks due to R hip pain. Denies fall history.  Working full-time in [employer] (medical).     Hand Dominance        Extremity/Trunk Assessment   Upper Extremity Assessment Upper Extremity Assessment: Overall  WFL for tasks assessed    Lower Extremity Assessment Lower Extremity Assessment:  (R hip grossly 2+ to 3-/5, limited by pain; very guarded and deliberate in all movement, all planes R hip)       Communication   Communication: No difficulties  Cognition Arousal/Alertness: Awake/alert Behavior During Therapy: WFL for tasks assessed/performed Overall Cognitive Status: Within Functional Limits for tasks assessed                                        General Comments      Exercises Other Exercises Other Exercises: Seated LE therex, 1x10, active ROM: ankle pumps, quad sets, LAQs; very slow and guarded.   Assessment/Plan    PT Assessment Patient needs continued PT services  PT Problem List Decreased mobility;Decreased strength;Decreased activity tolerance;Decreased balance;Decreased range of motion;Decreased knowledge of use of DME;Decreased safety awareness;Decreased knowledge of precautions;Cardiopulmonary status limiting activity       PT Treatment Interventions DME instruction;Gait training;Stair training;Functional mobility training;Therapeutic activities;Therapeutic exercise;Balance training;Patient/family education    PT Goals (Current goals can be found in the Care Plan section)  Acute Rehab PT Goals Patient Stated Goal: to return home today if I can PT Goal Formulation: With patient Time For Goal Achievement: 06/13/21 Potential to Achieve Goals: Good    Frequency BID   Barriers to discharge        Co-evaluation               AM-PAC PT "6 Clicks" Mobility  Outcome Measure Help needed turning from your back to your side while in a flat bed without using bedrails?: None Help needed moving from lying on your back to sitting on the side of a flat bed without using bedrails?: A Little Help needed moving to and from a bed to a chair (including a wheelchair)?: A Little Help needed standing up from a chair using your arms (e.g., wheelchair or  bedside chair)?: A Little Help needed to walk in hospital room?: A Little Help needed climbing 3-5 steps with a railing? : A Little 6 Click Score: 19    End of Session Equipment Utilized During Treatment: Gait belt Activity Tolerance: Patient tolerated treatment well Patient left: in chair;with call bell/phone within reach;with chair alarm set Nurse Communication: Mobility status PT Visit Diagnosis: Muscle weakness (generalized) (M62.81);Difficulty in walking, not elsewhere classified (R26.2)    Time: 0272-5366 PT Time Calculation (min) (ACUTE ONLY): 27 min   Charges:   PT Evaluation $PT Eval Moderate Complexity: 1 Mod PT Treatments $Therapeutic Exercise: 8-22 mins        Kenith Trickel H. Owens Shark, PT, DPT, NCS 05/30/21, 9:40 AM [phone]

## 2021-05-30 NOTE — Evaluation (Signed)
Occupational Therapy Evaluation Patient Details Name: Jamie [name] MRN: [id number] DOB: 28-Aug-1966 Today's Date: 05/30/2021    History of Present Illness admitted for acute hospitalization s/p R THR, anterior approach, WBAT (05/29/21)   Clinical Impression   Pt seen for OT evaluation this date, POD#1 from above surgery. Prior to surgery, pt was mod-independent with RW for functional mobility, ADLs, and IADLs. Pt occasionally received assistance from family to don socks and occasionally implemented seated rest breaks when cooking/cleaning house due to R hip pain. Pt reports 3 falls within past 6 months, attributing to knee buckling. Pt is eager to return to PLOF with less pain and improved safety and independence. Pt currently requires MIN GUARD for toilet transfers/hygiene and MIN GUARD for standing hand hygiene. This date, pt performed functional mobility of short household distances (~25 ft), requiring CGA for x1 knee buckle. Pt instructed in DME/AE for LB bathing and dressing tasks; handout provided. Pt would benefit from additional instruction in self care skills and techniques, with or without assistive devices, to support recall and carryover prior to discharge. Recommend HHOT and supervision/assistance during OOB mobility upon discharge.      Follow Up Recommendations  Home health OT;Supervision - Intermittent    Equipment Recommendations  Tub/shower bench       Precautions / Restrictions Precautions Precautions: Fall;Anterior Hip Restrictions Weight Bearing Restrictions: Yes RLE Weight Bearing: Weight bearing as tolerated      Mobility Bed Mobility               General bed mobility comments: seated in recliner beginning/end of treatment session    Transfers Overall transfer level: Needs assistance Equipment used: Rolling walker (2 wheeled) Transfers: Sit to/from Stand Sit to Stand: Min guard         General transfer comment: very slow and deliberate; min cuing  for hand placement    Balance Overall balance assessment: Needs assistance Sitting-balance support: No upper extremity supported;Feet supported Sitting balance-Leahy Scale: Good Sitting balance - Comments: Good sitting balance reading within BOS   Standing balance support: During functional activity;Bilateral upper extremity supported Standing balance-Leahy Scale: Fair Standing balance comment: CGA for x1 knee buckle during functional mobility                           ADL either performed or assessed with clinical judgement   ADL Overall ADL's : Needs assistance/impaired     Grooming: Wash/dry hands;Min guard;Standing                   Toilet Transfer: Min guard;Ambulation;BSC;RW Toilet Transfer Details (indicate cue type and reason): Verbal cues for safe hand placement Toileting- Clothing Manipulation and Hygiene: Min guard;Sit to/from stand       Functional mobility during ADLs: Min guard;Minimal assistance (to walk ~25 ft. Required CGA for x1 knee buckle)       Vision Patient Visual Report: No change from baseline              Pertinent Vitals/Pain Pain Assessment: 0-10 Pain Score: 6  Pain Location: R hip Pain Descriptors / Indicators: Aching;Grimacing;Guarding Pain Intervention(s): Limited activity within patient's tolerance;Monitored during session;Premedicated before session;Repositioned        Extremity/Trunk Assessment Upper Extremity Assessment Upper Extremity Assessment: Overall WFL for tasks assessed   Lower Extremity Assessment Lower Extremity Assessment: Defer to PT evaluation       Communication Communication Communication: No difficulties   Cognition Arousal/Alertness: Awake/alert Behavior During Therapy:  WFL for tasks assessed/performed Overall Cognitive Status: Within Functional Limits for tasks assessed                                                Home Living Family/patient expects to be  discharged to:: Private residence Living Arrangements: Spouse/significant other;Children Available Help at Discharge: Family;Available 24 hours/day Type of Home: House Home Access: Stairs to enter [employer] of Steps: 11 (R rail) to porch in front; 2-3 (L rail) in back (has to ambulate up hill to access)   Home Layout: One level     Bathroom Shower/Tub: Tub/shower unit         Home Equipment: Environmental consultant - 4 wheels;Bedside commode          Prior Functioning/Environment Level of Independence: Independent        Comments: Indep with ADLs, household and community mobilization without assist device; intermittent use of 4WRW (for longer distances) in recent weeks due to R hip pain. Reports 3 falls in past six months, attributing to knee buckling        OT Problem List: Decreased strength;Decreased activity tolerance;Impaired balance (sitting and/or standing);Impaired sensation;Pain      OT Treatment/Interventions: Self-care/ADL training;Therapeutic exercise;Energy conservation;DME and/or AE instruction;Therapeutic activities;Patient/family education;Balance training    OT Goals(Current goals can be found in the care plan section) Acute Rehab OT Goals Patient Stated Goal: to return home OT Goal Formulation: With patient Time For Goal Achievement: 06/13/21 Potential to Achieve Goals: Good ADL Goals Pt Will Perform Grooming: with modified independence;standing Pt Will Perform Lower Body Dressing: with supervision;with adaptive equipment;sit to/from stand Pt Will Transfer to Toilet: with modified independence;ambulating;bedside commode  OT Frequency: Min 1X/week    AM-PAC OT "6 Clicks" Daily Activity     Outcome Measure Help from another person eating meals?: None Help from another person taking care of personal grooming?: A Little Help from another person toileting, which includes using toliet, bedpan, or urinal?: A Little Help from another person bathing (including  washing, rinsing, drying)?: A Lot Help from another person to put on and taking off regular upper body clothing?: A Little Help from another person to put on and taking off regular lower body clothing?: A Lot 6 Click Score: 17   End of Session Equipment Utilized During Treatment: Gait belt;Rolling walker Nurse Communication: Mobility status  Activity Tolerance: Patient tolerated treatment well Patient left: in chair;with call bell/phone within reach;with chair alarm set  OT Visit Diagnosis: Unsteadiness on feet (R26.81);Muscle weakness (generalized) (M62.81);Pain Pain - Right/Left: Right Pain - part of body: Hip                Time: 1000-1029 OT Time Calculation (min): 29 min Charges:  OT General Charges $OT Visit: 1 Visit OT Evaluation $OT Eval Moderate Complexity: 1 Mod OT Treatments $Self Care/Home Management : 8-22 mins  Fredirick Maudlin, OTR/L [place]

## 2021-05-30 NOTE — Progress Notes (Signed)
   Subjective: 1 Day Post-Op Procedure(s) (LRB): TOTAL HIP ARTHROPLASTY ANTERIOR APPROACH (Right) Patient reports pain as mild.   Patient is well, and has had no acute complaints or problems Denies any CP, SOB, ABD pain. We will continue therapy today.  Plan is to go Home after hospital stay.  Objective: Vital signs in last 24 hours: Temp:  [97.9 F (36.6 C)-98.8 F (37.1 C)] 98.8 F (37.1 C) (06/01 0340) Pulse Rate:  [52-89] 72 (06/01 0718) Resp:  [10-21] 20 (06/01 0340) BP: (107-138)/(63-95) 123/73 (06/01 0718) SpO2:  [95 %-100 %] 98 % (06/01 0340) Weight:  [83 kg] 83 kg (05/31 1050)  Intake/Output from previous day: 05/31 0701 - 06/01 0700 In: 1959.3 [P.O.:480; I.V.:1279.3; IV Piggyback:200] Out: 1250 [Urine:1050; Blood:200] Intake/Output this shift: No intake/output data recorded.  Recent Labs    05/30/21 0436  HGB 11.6*   Recent Labs    05/30/21 0436  WBC 15.3*  RBC 3.75*  HCT 33.7*  PLT 265   Recent Labs    05/30/21 0436  NA 137  K 3.6  CL 105  CO2 24  BUN 16  CREATININE 0.60  GLUCOSE 157*  CALCIUM 8.6*   No results for input(s): LABPT, INR in the last 72 hours.  EXAM General - Patient is Alert, Appropriate and Oriented Extremity - Neurovascular intact Sensation intact distally Intact pulses distally Dorsiflexion/Plantar flexion intact No cellulitis present Compartment soft Dressing - dressing C/D/I and no drainage, prevena intact with out drainage Motor Function - intact, moving foot and toes well on exam.   Past Medical History:  Diagnosis Date  . Anxiety   . Brachial neuritis   . Cervical cancer ([place])   . Chronic neck pain   . Chronic pain not due to malignancy   . Depressive disorder    followed by Dr. Alethia Berthold.  . Disturbance, sleep   . DVT (deep venous thrombosis) ([facility])    left arm  . Elevated blood pressure reading without diagnosis of hypertension   . Embolism and thrombosis of splenic artery   . H/O thrombophlebitis    . Infarction of spleen   . Lumbosacral neuritis   . Obesity, Class II, BMI 35-39.9, with comorbidity     Assessment/Plan:   1 Day Post-Op Procedure(s) (LRB): TOTAL HIP ARTHROPLASTY ANTERIOR APPROACH (Right) Active Problems:   S/P hip replacement  Estimated body mass index is 33.47 kg/m as calculated from the following:   Height as of this encounter: 5\' 2"  (1.575 m).   Weight as of this encounter: 83 kg. Advance diet Up with therapy  Pain well controlled Labs and vss CM to assist with discharge to home with HHPT today pending completion of PT goals  DVT Prophylaxis - TED hose and SCDs, Eliquis Weight-Bearing as tolerated to right leg   T. Rachelle Hora, PA-C [place] 05/30/2021, 7:38 AM

## 2021-05-30 NOTE — Anesthesia Postprocedure Evaluation (Signed)
Anesthesia Post Note  Patient: AL GAGEN  Procedure(s) Performed: TOTAL HIP ARTHROPLASTY ANTERIOR APPROACH (Right Hip)  Patient location during evaluation: Nursing Unit Anesthesia Type: Spinal Level of consciousness: oriented and awake and alert Pain management: pain level controlled Vital Signs Assessment: post-procedure vital signs reviewed and stable Respiratory status: spontaneous breathing and respiratory function stable Cardiovascular status: blood pressure returned to baseline and stable Postop Assessment: no headache, no backache, no apparent nausea or vomiting and patient able to bend at knees Anesthetic complications: no   No complications documented.   Last Vitals:  Vitals:   05/30/21 0718 05/30/21 0822  BP: 123/73 118/75  Pulse: 72 70  Resp:  17  Temp:  37.1 C  SpO2:  98%    Last Pain:  Vitals:   05/30/21 0720  TempSrc:   PainSc: 6                  Alison Stalling

## 2021-05-31 LAB — SURGICAL PATHOLOGY

## 2021-06-13 ENCOUNTER — Telehealth (INDEPENDENT_AMBULATORY_CARE_PROVIDER_SITE_OTHER): Payer: BC Managed Care – PPO | Admitting: Psychiatry

## 2021-06-13 ENCOUNTER — Other Ambulatory Visit: Payer: Self-pay

## 2021-06-13 ENCOUNTER — Encounter: Payer: Self-pay | Admitting: Psychiatry

## 2021-06-13 DIAGNOSIS — F331 Major depressive disorder, recurrent, moderate: Secondary | ICD-10-CM | POA: Diagnosis not present

## 2021-06-13 DIAGNOSIS — F411 Generalized anxiety disorder: Secondary | ICD-10-CM

## 2021-06-13 DIAGNOSIS — G4701 Insomnia due to medical condition: Secondary | ICD-10-CM

## 2021-06-13 MED ORDER — BREXPIPRAZOLE 1 MG PO TABS: 1.0000 mg | ORAL_TABLET | Freq: Every day | ORAL | 1 refills | Status: DC

## 2021-06-13 MED ORDER — CITALOPRAM HYDROBROMIDE 40 MG PO TABS: 40.0000 mg | ORAL_TABLET | Freq: Every day | ORAL | 1 refills | Status: DC

## 2021-06-13 MED ORDER — PROPRANOLOL HCL 10 MG PO TABS: ORAL_TABLET | ORAL | 1 refills | Status: DC

## 2021-06-13 MED ORDER — AMITRIPTYLINE HCL 10 MG PO TABS: 10.0000 mg | ORAL_TABLET | Freq: Every evening | ORAL | 1 refills | Status: DC | PRN

## 2021-06-13 MED ORDER — GABAPENTIN 800 MG PO TABS: 800.0000 mg | ORAL_TABLET | Freq: Two times a day (BID) | ORAL | 1 refills | Status: DC

## 2021-06-13 NOTE — Progress Notes (Signed)
Virtual Visit via Video Note  I connected with Jamie [name] on 06/13/21 at  9:00 AM EDT by a video enabled telemedicine application and verified that I am speaking with the correct person using two identifiers.  Location Provider Location : [facility] Patient Location : Home  Participants: Patient , Provider   I discussed the limitations of evaluation and management by telemedicine and the availability of in person appointments. The patient expressed understanding and agreed to proceed.    I discussed the assessment and treatment plan with the patient. The patient was provided an opportunity to ask questions and all were answered. The patient agreed with the plan and demonstrated an understanding of the instructions.   The patient was advised to call back or seek an in-person evaluation if the symptoms worsen or if the condition fails to improve as anticipated.   [facility] MD OP Progress Note  06/13/2021 11:31 AM Jamie [name]  MRN:  [id number]  Chief Complaint:  Chief Complaint   Follow-up; Anxiety    HPI: Jamie [name] is a 55 year old Caucasian female, married, lives in [place], currently on [place], has a history of MDD, insomnia, GAD, chronic pain was evaluated by telemedicine today.  Patient is status post total hip arthroplasty on May 29, 2021.  Patient is currently recovering.  She has upcoming appointment with her surgeon.  She continues to be in pain although it is getting better.  She has started walking with a walker and is currently in physical therapy.  Patient is currently not working and is trying to apply for disability.  Patient reports overall she is coping okay although she does have episodes of feeling anxious and crying spells.  She however has been pushing herself to get up and walk.  She reports she is compliant on her medications which are beneficial.  Denies side effects.  Patient reports sleep was initially restless due to the pain however currently she is  sleeping better.  She continues to be compliant on the Elavil.  Denies side effects.  She denies any suicidality, homicidality or perceptual disturbances.  Patient denies any other concerns today.  Visit Diagnosis:    ICD-10-CM   1. MDD (major depressive disorder), recurrent episode, moderate (HCC)  F33.1 brexpiprazole (REXULTI) 1 MG TABS tablet    propranolol (INDERAL) 10 MG tablet    2. Insomnia due to medical condition  G47.01 amitriptyline (ELAVIL) 10 MG tablet   pain    3. GAD (generalized anxiety disorder)  F41.1 gabapentin (NEURONTIN) 800 MG tablet    citalopram (CELEXA) 40 MG tablet    propranolol (INDERAL) 10 MG tablet      Past Psychiatric History: I have reviewed past psychiatric history from progress note on 03/08/2019  Past Medical History:  Past Medical History:  Diagnosis Date   Anxiety    Brachial neuritis    Cervical cancer (HCC)    Chronic neck pain    Chronic pain not due to malignancy    Depressive disorder    followed by Dr. Alethia Berthold.   Disturbance, sleep    DVT (deep venous thrombosis) (HCC)    left arm   Elevated blood pressure reading without diagnosis of hypertension    Embolism and thrombosis of splenic artery    H/O thrombophlebitis    Infarction of spleen    Lumbosacral neuritis    Obesity, Class II, BMI 35-39.9, with comorbidity     Past Surgical History:  Procedure Laterality Date   CESAREAN SECTION  09/24/2010  IVC FILTER INSERTION N/A 05/24/2021   Procedure: IVC FILTER INSERTION;  Surgeon: Algernon Huxley, MD;  Location: [facility];  Service: Cardiovascular;  Laterality: N/A;   TONSILLECTOMY AND ADENOIDECTOMY  09/24/2010   TOTAL HIP ARTHROPLASTY Right 05/29/2021   Procedure: TOTAL HIP ARTHROPLASTY ANTERIOR APPROACH;  Surgeon: Hessie Knows, MD;  Location: [facility];  Service: Orthopedics;  Laterality: Right;   TUBAL LIGATION  09/24/2010    Family Psychiatric History: I have reviewed family psychiatric history from progress  note on 03/08/2019  Family History:  Family History  Problem Relation Age of Onset   Anxiety disorder Mother    Depression Mother    Alcohol abuse Brother     Social History: I have reviewed social history from progress note on 03/08/2019 Social History   Socioeconomic History   Marital status: Married    Spouse name: rodney   Number of children: 3   Years of education: Not on file   Highest education level: Some college, no degree  Occupational History    Comment: full time  Tobacco Use   Smoking status: Never   Smokeless tobacco: Never  Vaping Use   Vaping Use: Never used  Substance and Sexual Activity   Alcohol use: Yes    Alcohol/week: 0.0 standard drinks    Comment: social   Drug use: Not Currently    Types: Marijuana   Sexual activity: Yes    Partners: Male    Birth control/protection: Surgical    Comment: Tubal ligation   Other Topics Concern   Not on file  Social History Narrative   Not on file   Social Determinants of Health   Financial Resource Strain: Not on file  Food Insecurity: Not on file  Transportation Needs: Not on file  Physical Activity: Not on file  Stress: Not on file  Social Connections: Not on file    Allergies:  Allergies  Allergen Reactions   Neuromuscular Blocking Agents Other (See Comments)    Don't work   Hydrocodone-Acetaminophen Nausea Only    Metabolic Disorder Labs: No results found for: HGBA1C, MPG No results found for: PROLACTIN No results found for: CHOL, TRIG, HDL, CHOLHDL, VLDL, LDLCALC No results found for: TSH  Therapeutic Level Labs: No results found for: LITHIUM No results found for: VALPROATE No components found for:  CBMZ  Current Medications: Current Outpatient Medications  Medication Sig Dispense Refill   cyclobenzaprine (FLEXERIL) 5 MG tablet Take 1 tablet by mouth 3 (three) times daily as needed.     amitriptyline (ELAVIL) 10 MG tablet Take 1-2 tablets (10-20 mg total) by mouth at bedtime as needed  for sleep. 60 tablet 1   apixaban (ELIQUIS) 2.5 MG TABS tablet Take 1 tablet (2.5 mg total) by mouth every 12 (twelve) hours. 60 tablet 3   brexpiprazole (REXULTI) 1 MG TABS tablet Take 1 tablet (1 mg total) by mouth daily. 30 tablet 1   citalopram (CELEXA) 40 MG tablet Take 1 tablet (40 mg total) by mouth daily. 30 tablet 1   docusate sodium (COLACE) 100 MG capsule Take 1 capsule (100 mg total) by mouth 2 (two) times daily. 10 capsule 0   gabapentin (NEURONTIN) 800 MG tablet Take 1 tablet (800 mg total) by mouth 2 (two) times daily. 60 tablet 1   methocarbamol (ROBAXIN) 500 MG tablet Take 1 tablet (500 mg total) by mouth every 6 (six) hours as needed for muscle spasms. 30 tablet 0   oxyCODONE (OXY IR/ROXICODONE) 5 MG immediate release  tablet Take 1-2 tablets (5-10 mg total) by mouth every 4 (four) hours as needed for moderate pain (pain score 4-6). 30 tablet 0   propranolol (INDERAL) 10 MG tablet Take 1 tablet twice a day as needed for breakthrough anxiety 60 tablet 1   traMADol (ULTRAM) 50 MG tablet Take 1 tablet (50 mg total) by mouth every 6 (six) hours as needed. 30 tablet 0   No current facility-administered medications for this visit.     Musculoskeletal: Strength & Muscle Tone:  UTA Gait & Station:  UTA Patient leans: N/A  Psychiatric Specialty Exam: Review of Systems  Musculoskeletal:        Hip pain-s/p surgery  Psychiatric/Behavioral:  Positive for sleep disturbance. The patient is nervous/anxious.   All other systems reviewed and are negative.  There were no vitals taken for this visit.There is no height or weight on file to calculate BMI.  General Appearance: Casual  Eye Contact:  Fair  Speech:  Clear and Coherent  Volume:  Normal  Mood:  Anxious  Affect:  Congruent  Thought Process:  Goal Directed and Descriptions of Associations: Intact  Orientation:  Full (Time, Place, and Person)  Thought Content: Logical   Suicidal Thoughts:  No  Homicidal Thoughts:  No   Memory:  Immediate;   Fair Recent;   Fair Remote;   Fair  Judgement:  Fair  Insight:  Fair  Psychomotor Activity:  Normal  Concentration:  Concentration: Fair and Attention Span: Fair  Recall:  [employer] of Knowledge: Fair  Language: Fair  Akathisia:  No  Handed:  Right  AIMS (if indicated): not done, UTA  Assets:  Communication Skills Desire for Improvement Housing Social Support  ADL's:  Intact  Cognition: WNL  Sleep:   Improving   Screenings: GAD-7    Flowsheet Row Video Visit from 06/13/2021 in [place] Video Visit from 05/17/2021 in [place]  Total GAD-7 Score 12 20      PHQ2-9    [place] Video Visit from 06/13/2021 in [place] Video Visit from 05/17/2021 in [place] Video Visit from 05/03/2021 in [place] Video Visit from 04/02/2021 in [place] Video Visit from 03/13/2021 in [place]  PHQ-2 Total Score 1 4 5 1 3   PHQ-9 Total Score -- 13 12 -- 10      Flowsheet Row Admission (Discharged) from 05/29/2021 in [place] (1A) Pre-Admission Testing 60 from 05/21/2021 in [place] Video Visit from 05/03/2021 in [place] No Risk No Risk Low Risk        Assessment and Plan: Jamie [name] is a 55 year old Caucasian female who has a history of MDD, insomnia, chronic pain was evaluated by telemedicine today.  Patient is currently recovering from a total hip arthroplasty.  Patient is currently making progress with regards to her mood and will continue to benefit from medication management and psychotherapy sessions.  Plan MDD-improving Celexa 40 mg p.o. daily Rexulti 1 mg p.o. nightly  Insomnia-improving Elavil 10 to 20 mg  manage nightly as needed She will continue to need sufficient pain management  GAD-improving GAD-7 today equals 12 Celexa 40 mg p.o. daily Gabapentin 800 mg p.o. twice daily Continue CBT  Patient is status post hip replacement surgery-dated 05/29/2021.  Discharge 05/30/2021.  Reviewed notes in EHR dated 05/30/2021- Dr.Menz, Mr.Gaines.  Follow-up  in clinic in 4 to 5 weeks or sooner if needed.  This note was generated in part or whole with voice recognition software. Voice recognition is usually quite accurate but there are transcription errors that can and very often do occur. I apologize for any typographical errors that were not detected and corrected.     Ursula Alert, MD 06/13/2021, 11:31 AM

## 2021-07-12 ENCOUNTER — Telehealth (INDEPENDENT_AMBULATORY_CARE_PROVIDER_SITE_OTHER): Payer: Self-pay

## 2021-07-12 ENCOUNTER — Other Ambulatory Visit: Payer: Self-pay

## 2021-07-12 ENCOUNTER — Ambulatory Visit (INDEPENDENT_AMBULATORY_CARE_PROVIDER_SITE_OTHER): Payer: BC Managed Care – PPO

## 2021-07-12 ENCOUNTER — Ambulatory Visit (INDEPENDENT_AMBULATORY_CARE_PROVIDER_SITE_OTHER): Payer: BC Managed Care – PPO | Admitting: Nurse Practitioner

## 2021-07-12 VITALS — BP 123/80 | HR 85 | Ht 62.0 in | Wt 173.0 lb

## 2021-07-12 DIAGNOSIS — I82622 Acute embolism and thrombosis of deep veins of left upper extremity: Secondary | ICD-10-CM | POA: Diagnosis not present

## 2021-07-12 DIAGNOSIS — M1611 Unilateral primary osteoarthritis, right hip: Secondary | ICD-10-CM | POA: Diagnosis not present

## 2021-07-12 NOTE — Telephone Encounter (Signed)
I attempted to contact the patient to schedule her for a IVC filter removal with Dr. Lucky Cowboy. A message was left for a return call.

## 2021-07-17 ENCOUNTER — Telehealth (INDEPENDENT_AMBULATORY_CARE_PROVIDER_SITE_OTHER): Payer: Self-pay

## 2021-07-17 NOTE — Telephone Encounter (Signed)
Spoke with the patient and she is scheduled with Dr. Lucky Cowboy for a IVC filter removal on 07/18/21 with a 10:45 am arrival time to the [facility]. Pre-procedure instructions were discussed and patient understood.

## 2021-07-18 ENCOUNTER — Other Ambulatory Visit: Payer: Self-pay

## 2021-07-18 ENCOUNTER — Other Ambulatory Visit (INDEPENDENT_AMBULATORY_CARE_PROVIDER_SITE_OTHER): Payer: Self-pay | Admitting: Nurse Practitioner

## 2021-07-18 ENCOUNTER — Encounter: Payer: Self-pay | Admitting: Vascular Surgery

## 2021-07-18 ENCOUNTER — Encounter
Admission: RE | Disposition: A | Discharge status: Home / Self Care | Payer: Self-pay | Source: Home / Self Care | Attending: Vascular Surgery

## 2021-07-18 ENCOUNTER — Telehealth (INDEPENDENT_AMBULATORY_CARE_PROVIDER_SITE_OTHER): Payer: BC Managed Care – PPO | Admitting: Psychiatry

## 2021-07-18 ENCOUNTER — Ambulatory Visit
Admission: RE | Admit: 2021-07-18 | Discharge: 2021-07-18 | Disposition: A | Discharge status: Home / Self Care | Payer: BC Managed Care – PPO | Attending: Vascular Surgery | Admitting: Vascular Surgery

## 2021-07-18 DIAGNOSIS — Z86718 Personal history of other venous thrombosis and embolism: Secondary | ICD-10-CM | POA: Insufficient documentation

## 2021-07-18 DIAGNOSIS — M1990 Unspecified osteoarthritis, unspecified site: Secondary | ICD-10-CM | POA: Diagnosis not present

## 2021-07-18 DIAGNOSIS — Z885 Allergy status to narcotic agent status: Secondary | ICD-10-CM | POA: Diagnosis not present

## 2021-07-18 DIAGNOSIS — F331 Major depressive disorder, recurrent, moderate: Secondary | ICD-10-CM

## 2021-07-18 DIAGNOSIS — Z4589 Encounter for adjustment and management of other implanted devices: Secondary | ICD-10-CM

## 2021-07-18 DIAGNOSIS — I82409 Acute embolism and thrombosis of unspecified deep veins of unspecified lower extremity: Secondary | ICD-10-CM

## 2021-07-18 HISTORY — PX: IVC FILTER REMOVAL: CATH118246

## 2021-07-18 SURGERY — IVC FILTER REMOVAL
Anesthesia: Moderate Sedation

## 2021-07-18 MED ORDER — HYDROMORPHONE HCL 1 MG/ML IJ SOLN: 1.0000 mg | Freq: Once | INTRAMUSCULAR | Status: DC | PRN

## 2021-07-18 MED ORDER — DIPHENHYDRAMINE HCL 50 MG/ML IJ SOLN: 50.0000 mg | Freq: Once | INTRAMUSCULAR | Status: DC | PRN

## 2021-07-18 MED ORDER — ONDANSETRON HCL 4 MG/2ML IJ SOLN: 4.0000 mg | Freq: Four times a day (QID) | INTRAMUSCULAR | Status: DC | PRN

## 2021-07-18 MED ORDER — SODIUM CHLORIDE 0.9 % IV SOLN: INTRAVENOUS | Status: DC

## 2021-07-18 MED ORDER — FENTANYL CITRATE (PF) 100 MCG/2ML IJ SOLN
INTRAMUSCULAR | Status: DC | PRN
  Administered 2021-07-18: 50 ug via INTRAVENOUS

## 2021-07-18 MED ORDER — MIDAZOLAM HCL 2 MG/ML PO SYRP: 8.0000 mg | ORAL_SOLUTION | Freq: Once | ORAL | Status: DC | PRN

## 2021-07-18 MED ORDER — IODIXANOL 320 MG/ML IV SOLN
INTRAVENOUS | Status: DC | PRN
  Administered 2021-07-18: 15 mL

## 2021-07-18 MED ORDER — METHYLPREDNISOLONE SODIUM SUCC 125 MG IJ SOLR: 125.0000 mg | Freq: Once | INTRAMUSCULAR | Status: DC | PRN

## 2021-07-18 MED ORDER — CEFAZOLIN SODIUM-DEXTROSE 2-4 GM/100ML-% IV SOLN
2.0000 g | Freq: Once | INTRAVENOUS | Status: AC
  Administered 2021-07-18: 2 g via INTRAVENOUS

## 2021-07-18 MED ORDER — FAMOTIDINE 20 MG PO TABS: 40.0000 mg | ORAL_TABLET | Freq: Once | ORAL | Status: DC | PRN

## 2021-07-18 MED ORDER — FENTANYL CITRATE (PF) 100 MCG/2ML IJ SOLN
INTRAMUSCULAR | Status: AC
  Filled 2021-07-18: qty 2

## 2021-07-18 MED ORDER — MIDAZOLAM HCL 2 MG/2ML IJ SOLN
INTRAMUSCULAR | Status: DC | PRN
  Administered 2021-07-18: 2 mg via INTRAVENOUS

## 2021-07-18 MED ORDER — MIDAZOLAM HCL 5 MG/5ML IJ SOLN
INTRAMUSCULAR | Status: AC
  Filled 2021-07-18: qty 5

## 2021-07-18 SURGICAL SUPPLY — 4 items
COVER PROBE U/S 5X48 (MISCELLANEOUS) ×2 IMPLANT
PACK ANGIOGRAPHY (CUSTOM PROCEDURE TRAY) ×2 IMPLANT
SET VENACAVA FILTER RETRIEVAL (MISCELLANEOUS) ×2 IMPLANT
WIRE GUIDERIGHT .035X150 (WIRE) ×2 IMPLANT

## 2021-07-18 NOTE — Progress Notes (Signed)
Subjective:    Patient ID: Jamie [name], female    DOB: Mar 08, 1966, 55 y.o.   MRN: [id number] Chief Complaint  Patient presents with  . Follow-up    [facility] 7 week FU IVC filter insertion DVT study     Jamie [name] is a 55 year old female that presents today following knee replacement.  The patient had a prior DVT as well as IVC filter was placed.  The patient has been working with physical therapy and she notes that she still has some vertigo but does feel much better.  She denies any swelling or redness consistent with a DVT.  The patient had noninvasive studies done today that showed no evidence of DVT in the bilateral lower extremities.  Review of Systems  Cardiovascular:  Positive for leg swelling.  All other systems reviewed and are negative.     Objective:   Physical Exam Vitals reviewed.  HENT:     Head: Normocephalic.  Cardiovascular:     Rate and Rhythm: Normal rate.  Pulmonary:     Effort: Pulmonary effort is normal.  Neurological:     Mental Status: She is alert and oriented to person, place, and time. Mental status is at baseline.  Psychiatric:        Mood and Affect: Mood normal.        Behavior: Behavior normal.        Thought Content: Thought content normal.        Judgment: Judgment normal.    BP 123/80   Pulse 85   Ht 5\' 2"  (1.575 m)   Wt 173 lb (78.5 kg)   BMI 31.64 kg/m   Past Medical History:  Diagnosis Date  . Anxiety   . Brachial neuritis   . Cervical cancer ([facility])   . Chronic neck pain   . Chronic pain not due to malignancy   . Depressive disorder    followed by Dr. Alethia Berthold.  . Disturbance, sleep   . DVT (deep venous thrombosis) (HCC)    left arm  . Elevated blood pressure reading without diagnosis of hypertension   . Embolism and thrombosis of splenic artery   . H/O thrombophlebitis   . Infarction of spleen   . Lumbosacral neuritis   . Obesity, Class II, BMI 35-39.9, with comorbidity     Social History   Socioeconomic  History  . Marital status: Married    Spouse name: rodney  . Number of children: 3  . Years of education: Not on file  . Highest education level: Some college, no degree  Occupational History    Comment: full time  Tobacco Use  . Smoking status: Never  . Smokeless tobacco: Never  Vaping Use  . Vaping Use: Never used  Substance and Sexual Activity  . Alcohol use: Yes    Alcohol/week: 0.0 standard drinks    Comment: social  . Drug use: Not Currently    Types: Marijuana  . Sexual activity: Yes    Partners: Male    Birth control/protection: Surgical    Comment: Tubal ligation   Other Topics Concern  . Not on file  Social History Narrative  . Not on file   Social Determinants of Health   Financial Resource Strain: Not on file  Food Insecurity: Not on file  Transportation Needs: Not on file  Physical Activity: Not on file  Stress: Not on file  Social Connections: Not on file  Intimate Partner Violence: Not on file    Past Surgical  History:  Procedure Laterality Date  . CESAREAN SECTION  09/24/2010  . IVC FILTER INSERTION N/A 05/24/2021   Procedure: IVC FILTER INSERTION;  Surgeon: Algernon Huxley, MD;  Location: [facility];  Service: Cardiovascular;  Laterality: N/A;  . TONSILLECTOMY AND ADENOIDECTOMY  09/24/2010  . TOTAL HIP ARTHROPLASTY Right 05/29/2021   Procedure: TOTAL HIP ARTHROPLASTY ANTERIOR APPROACH;  Surgeon: Hessie Knows, MD;  Location: [facility];  Service: Orthopedics;  Laterality: Right;  . TUBAL LIGATION  09/24/2010    Family History  Problem Relation Age of Onset  . Anxiety disorder Mother   . Depression Mother   . Alcohol abuse Brother     Allergies  Allergen Reactions  . Neuromuscular Blocking Agents Other (See Comments)    Don't work  . Hydrocodone-Acetaminophen Nausea Only    CBC Latest Ref Rng & Units 05/30/2021 05/21/2021 07/11/2016  WBC 4.0 - 10.5 K/uL 15.3(H) 6.1 7.8  Hemoglobin 12.0 - 15.0 g/dL 11.6(L) 14.2 12.3  Hematocrit 36.0 -  46.0 % 33.7(L) 41.9 37.5  Platelets 150 - 400 K/uL 265 309 426(H)      CMP     Component Value Date/Time   NA 137 05/30/2021 0436   K 3.6 05/30/2021 0436   CL 105 05/30/2021 0436   CO2 24 05/30/2021 0436   GLUCOSE 157 (H) 05/30/2021 0436   BUN 16 05/30/2021 0436   CREATININE 0.60 05/30/2021 0436   CREATININE 0.75 07/11/2016 1158   CALCIUM 8.6 (L) 05/30/2021 0436   PROT 7.5 05/21/2021 1021   ALBUMIN 3.9 05/21/2021 1021   AST 22 05/21/2021 1021   ALT 15 05/21/2021 1021   ALKPHOS 78 05/21/2021 1021   BILITOT 0.7 05/21/2021 1021   GFRNONAA >60 05/30/2021 0436   GFRAA >60 09/21/2015 1652     No results found.     Assessment & Plan:   1. Deep vein thrombosis (DVT) of left upper extremity, unspecified chronicity, unspecified vein (HCC) Noninvasive studies show no evidence of DVT postsurgery.  We discussed the removal of the IVC filter.  We discussed the risk, benefits and alternatives.  The patient is agreeable to proceed.  We will see patient back post IVC filter removal.   Current Outpatient Medications on File Prior to Visit  Medication Sig Dispense Refill  . amitriptyline (ELAVIL) 10 MG tablet Take 1-2 tablets (10-20 mg total) by mouth at bedtime as needed for sleep. 60 tablet 1  . apixaban (ELIQUIS) 2.5 MG TABS tablet Take 1 tablet (2.5 mg total) by mouth every 12 (twelve) hours. (Patient not taking: Reported on 07/18/2021) 60 tablet 3  . brexpiprazole (REXULTI) 1 MG TABS tablet Take 1 tablet (1 mg total) by mouth daily. 30 tablet 1  . citalopram (CELEXA) 40 MG tablet Take 1 tablet (40 mg total) by mouth daily. 30 tablet 1  . cyclobenzaprine (FLEXERIL) 5 MG tablet Take 1 tablet by mouth 3 (three) times daily as needed. (Patient not taking: Reported on 07/18/2021)    . docusate sodium (COLACE) 100 MG capsule Take 1 capsule (100 mg total) by mouth 2 (two) times daily. (Patient not taking: Reported on 07/18/2021) 10 capsule 0  . gabapentin (NEURONTIN) 800 MG tablet Take 1 tablet  (800 mg total) by mouth 2 (two) times daily. 60 tablet 1  . methocarbamol (ROBAXIN) 500 MG tablet Take 1 tablet (500 mg total) by mouth every 6 (six) hours as needed for muscle spasms. (Patient not taking: Reported on 07/18/2021) 30 tablet 0  . oxyCODONE (OXY IR/ROXICODONE) 5  MG immediate release tablet Take 1-2 tablets (5-10 mg total) by mouth every 4 (four) hours as needed for moderate pain (pain score 4-6). (Patient not taking: Reported on 07/18/2021) 30 tablet 0  . propranolol (INDERAL) 10 MG tablet Take 1 tablet twice a day as needed for breakthrough anxiety 60 tablet 1  . traMADol (ULTRAM) 50 MG tablet Take 1 tablet (50 mg total) by mouth every 6 (six) hours as needed. (Patient not taking: Reported on 07/18/2021) 30 tablet 0   No current facility-administered medications on file prior to visit.    There are no Patient Instructions on file for this visit. No follow-ups on file.   Kris Hartmann, NP

## 2021-07-18 NOTE — H&P (Signed)
[facility] Admission History & Physical  MRN : [id number]  Jamie [name] is a 55 y.o. (11-Jul-1966) female who presents with chief complaint of No chief complaint on file. Marland Kitchen  History of Present Illness: Patient presents today for filter retrieval.  She had a filter placed about 2 months ago immediately before hip replacement.  She had a hip replacement and has returned her mobility well now.  Duplex yesterday showed no DVT.  Her filter was originally placed due to her previous history of DVT and a suspected hypercoagulable state.  Current Facility-Administered Medications  Medication Dose Route Frequency Provider Last Rate Last Admin   0.9 %  sodium chloride infusion   Intravenous Continuous Kris Hartmann, NP 75 mL/hr at 07/18/21 0925 New Bag at 07/18/21 0925   ceFAZolin (ANCEF) IVPB 2g/100 mL premix  2 g Intravenous Once Kris Hartmann, NP       diphenhydrAMINE (BENADRYL) injection 50 mg  50 mg Intravenous Once PRN Kris Hartmann, NP       famotidine (PEPCID) tablet 40 mg  40 mg Oral Once PRN Kris Hartmann, NP       HYDROmorphone (DILAUDID) injection 1 mg  1 mg Intravenous Once PRN Kris Hartmann, NP       methylPREDNISolone sodium succinate (SOLU-MEDROL) 125 mg/2 mL injection 125 mg  125 mg Intravenous Once PRN Kris Hartmann, NP       midazolam (VERSED) 2 MG/ML syrup 8 mg  8 mg Oral Once PRN Kris Hartmann, NP       ondansetron [facility]) injection 4 mg  4 mg Intravenous Q6H PRN Kris Hartmann, NP        Past Medical History:  Diagnosis Date   Anxiety    Brachial neuritis    Cervical cancer (HCC)    Chronic neck pain    Chronic pain not due to malignancy    Depressive disorder    followed by Dr. Alethia Berthold.   Disturbance, sleep    DVT (deep venous thrombosis) (HCC)    left arm   Elevated blood pressure reading without diagnosis of hypertension    Embolism and thrombosis of splenic artery    H/O thrombophlebitis    Infarction of spleen     Lumbosacral neuritis    Obesity, Class II, BMI 35-39.9, with comorbidity     Past Surgical History:  Procedure Laterality Date   CESAREAN SECTION  09/24/2010   IVC FILTER INSERTION N/A 05/24/2021   Procedure: IVC FILTER INSERTION;  Surgeon: Algernon Huxley, MD;  Location: [facility];  Service: Cardiovascular;  Laterality: N/A;   TONSILLECTOMY AND ADENOIDECTOMY  09/24/2010   TOTAL HIP ARTHROPLASTY Right 05/29/2021   Procedure: TOTAL HIP ARTHROPLASTY ANTERIOR APPROACH;  Surgeon: Hessie Knows, MD;  Location: [facility];  Service: Orthopedics;  Laterality: Right;   TUBAL LIGATION  09/24/2010     Social History   Tobacco Use   Smoking status: Never   Smokeless tobacco: Never  Vaping Use   Vaping Use: Never used  Substance Use Topics   Alcohol use: Yes    Alcohol/week: 0.0 standard drinks    Comment: social   Drug use: Not Currently    Types: Marijuana     Family History  Problem Relation Age of Onset   Anxiety disorder Mother    Depression Mother    Alcohol abuse Brother     Allergies  Allergen Reactions   Neuromuscular Blocking Agents Other (See  Comments)    Don't work   Hydrocodone-Acetaminophen Nausea Only     REVIEW OF SYSTEMS (Negative unless checked)  Constitutional: [] Weight loss  [] Fever  [] Chills Cardiac: [] Chest pain   [] Chest pressure   [] Palpitations   [] Shortness of breath when laying flat   [] Shortness of breath at rest   [] Shortness of breath with exertion. Vascular:  [x] Pain in legs with walking   [] Pain in legs at rest   [] Pain in legs when laying flat   [] Claudication   [] Pain in feet when walking  [] Pain in feet at rest  [] Pain in feet when laying flat   [x] History of DVT   [] Phlebitis   [] Swelling in legs   [] Varicose veins   [] Non-healing ulcers Pulmonary:   [] Uses home oxygen   [] Productive cough   [] Hemoptysis   [] Wheeze  [] COPD   [] Asthma Neurologic:  [] Dizziness  [] Blackouts   [] Seizures   [] History of stroke   [] History of TIA  [] Aphasia    [] Temporary blindness   [] Dysphagia   [] Weakness or numbness in arms   [] Weakness or numbness in legs Musculoskeletal:  [x] Arthritis   [] Joint swelling   [x] Joint pain   [x] Low back pain Hematologic:  [] Easy bruising  [] Easy bleeding   [] Hypercoagulable state   [] Anemic  [] Hepatitis Gastrointestinal:  [] Blood in stool   [] Vomiting blood  [] Gastroesophageal reflux/heartburn   [] Difficulty swallowing. Genitourinary:  [] Chronic kidney disease   [] Difficult urination  [] Frequent urination  [] Burning with urination   [] Blood in urine Skin:  [] Rashes   [] Ulcers   [] Wounds Psychological:  [x] History of anxiety   [x]  History of major depression.  Physical Examination  Vitals:   07/18/21 0915  BP: (!) 135/93  Pulse: 63  Resp: 17  Temp: 98.4 F (36.9 C)  TempSrc: Oral  SpO2: 96%  Weight: 78.5 kg  Height: 5\' 2"  (1.575 m)   Body mass index is 31.64 kg/m. Gen: WD/WN, NAD Head: [place]/AT, No temporalis wasting.  Ear/Nose/Throat: Hearing grossly intact, nares w/o erythema or drainage, oropharynx w/o Erythema/Exudate,  Eyes: Conjunctiva clear, sclera non-icteric Neck: Trachea midline.  No JVD.  Pulmonary:  Good air movement, respirations not labored, no use of accessory muscles.  Cardiac: RRR, normal S1, S2. Vascular:  Vessel Right Left  Radial Palpable Palpable           Musculoskeletal: M/S 5/5 throughout.  Extremities without ischemic changes.  No deformity or atrophy.  Neurologic: Sensation grossly intact in extremities.  Symmetrical.  Speech is fluent. Motor exam as listed above. Psychiatric: Judgment intact, Mood & affect appropriate for pt's clinical situation. Dermatologic: No rashes or ulcers noted.  No cellulitis or open wounds.      CBC Lab Results  Component Value Date   WBC 15.3 (H) 05/30/2021   HGB 11.6 (L) 05/30/2021   HCT 33.7 (L) 05/30/2021   MCV 89.9 05/30/2021   PLT 265 05/30/2021    BMET    Component Value Date/Time   NA 137 05/30/2021 0436   K 3.6  05/30/2021 0436   CL 105 05/30/2021 0436   CO2 24 05/30/2021 0436   GLUCOSE 157 (H) 05/30/2021 0436   BUN 16 05/30/2021 0436   CREATININE 0.60 05/30/2021 0436   CREATININE 0.75 07/11/2016 1158   CALCIUM 8.6 (L) 05/30/2021 0436   GFRNONAA >60 05/30/2021 0436   GFRAA >60 09/21/2015 1652   CrCl cannot be calculated (Patient's most recent lab result is older than the maximum 21 days allowed.).  COAG No results found for:  INR, PROTIME  Radiology VAS [place] LOWER EXTREMITY VENOUS (DVT)  Result Date: 07/17/2021  Lower Venous DVT Study Patient Name:  TATA TIMMINS  Date of Exam:   07/12/2021 Medical Rec #: [id number]        Accession #:    [id number] Date of Birth: [birth year]        Patient Gender: F Patient Age:   67Y Exam Location:  [facility] Procedure:      VAS [place] LOWER EXTREMITY VENOUS (DVT) Referring Phys: Triana --------------------------------------------------------------------------------  Indications: Post-op, and lt hip replacement IVC due to hx of dvt.  Performing Technologist: Concha Norway RVT  Examination Guidelines: A complete evaluation includes B-mode imaging, spectral Doppler, color Doppler, and power Doppler as needed of all accessible portions of each vessel. Bilateral testing is considered an integral part of a complete examination. Limited examinations for reoccurring indications may be performed as noted. The reflux portion of the exam is performed with the patient in reverse Trendelenburg.  +---------+---------------+---------+-----------+----------+--------------+ RIGHT    CompressibilityPhasicitySpontaneityPropertiesThrombus Aging +---------+---------------+---------+-----------+----------+--------------+ CFV      Full           Yes                                          +---------+---------------+---------+-----------+----------+--------------+ SFJ      Full           Yes                                           +---------+---------------+---------+-----------+----------+--------------+ FV Prox  Full           Yes                                          +---------+---------------+---------+-----------+----------+--------------+ FV Mid   Full           Yes                                          +---------+---------------+---------+-----------+----------+--------------+ FV DistalFull           Yes                                          +---------+---------------+---------+-----------+----------+--------------+ PFV      Full           Yes                                          +---------+---------------+---------+-----------+----------+--------------+ POP      Full           Yes                                          +---------+---------------+---------+-----------+----------+--------------+ PTV      Full  Yes                                          +---------+---------------+---------+-----------+----------+--------------+ PERO     Full           Yes                                          +---------+---------------+---------+-----------+----------+--------------+ Gastroc  Full           Yes                                          +---------+---------------+---------+-----------+----------+--------------+ GSV      Full           Yes                                          +---------+---------------+---------+-----------+----------+--------------+ SSV      Full           Yes                                          +---------+---------------+---------+-----------+----------+--------------+   +---------+---------------+---------+-----------+----------+--------------+ LEFT     CompressibilityPhasicitySpontaneityPropertiesThrombus Aging +---------+---------------+---------+-----------+----------+--------------+ CFV      Full           Yes      Yes                                  +---------+---------------+---------+-----------+----------+--------------+ SFJ      Full           Yes      Yes                                 +---------+---------------+---------+-----------+----------+--------------+ FV Prox  Full           Yes      Yes                                 +---------+---------------+---------+-----------+----------+--------------+ FV Mid   Full           Yes      Yes                                 +---------+---------------+---------+-----------+----------+--------------+ FV DistalFull           Yes      Yes                                 +---------+---------------+---------+-----------+----------+--------------+ PFV      Full           Yes      Yes                                 +---------+---------------+---------+-----------+----------+--------------+  POP      Full           Yes      Yes                                 +---------+---------------+---------+-----------+----------+--------------+ PTV      Full           Yes      Yes                                 +---------+---------------+---------+-----------+----------+--------------+ PERO     Full           Yes      Yes                                 +---------+---------------+---------+-----------+----------+--------------+ Gastroc  Full           Yes      Yes                                 +---------+---------------+---------+-----------+----------+--------------+ GSV      Full           Yes      Yes                                 +---------+---------------+---------+-----------+----------+--------------+ SSV      Full           Yes      Yes                                 +---------+---------------+---------+-----------+----------+--------------+     Summary: BILATERAL: - No evidence of deep vein thrombosis seen in the lower extremities, bilaterally. - No evidence of superficial venous thrombosis in the lower extremities, bilaterally. -   *See  table(s) above for measurements and observations. Electronically signed by Leotis Pain MD on 07/17/2021 at 1:33:10 PM.    Final      Assessment/Plan 1.  Status post IVC filter placement.  This was done preliminary to hip replacement in a patient with a previous history of DVT and a suspected hypercoagulable state.  No DVT on follow-up study earlier this week.  Here for filter removal.  Risks and benefits discussed. 2.  Arthritis.  Recent hip replacement which went well.  Her mobility is significantly improved.    Leotis Pain, MD  07/18/2021 9:46 AM

## 2021-07-18 NOTE — Op Note (Signed)
[facility]   OPERATIVE NOTE    PRE-OPERATIVE DIAGNOSIS:  1. DVT 2. status post IVC filter placement  POST-OPERATIVE DIAGNOSIS: Same as above  PROCEDURE: 1.   Ultrasound guidance for vascular access right jugular vein 2.   Catheter placement into inferior vena cava from right jugular vein 3.   Inferior venacavogram 4.   Retrieval of [place] IVC filter  SURGEON: Leotis Pain, MD  ASSISTANT(S): None  ANESTHESIA: Local with moderate conscious sedation for approximately 18 minutes using 2 mg of Versed and 50 mcg of Fentanyl  ESTIMATED BLOOD LOSS: 5 cc  CONTRAST:  15 cc  FLUORO TIME:  0.9 minutes  FINDING(S): 1.  patent IVC  SPECIMEN(S):  IVC filter  INDICATIONS:    Patient is a 55 y.o. female who presents with a previous history of IVC filter placement preliminary to a hip replacement which she has recovered well from. Patient has a negative DVT study and no longer needs this filter. The patient remains on anticoagulation. Risks and benefits were discussed, and informed consent was obtained.  DESCRIPTION: After obtaining full informed written consent, the patient was brought back to the vascular suite and placed supine upon the table. Moderate conscious sedation was administered during a face to face encounter with the patient throughout the procedure with my supervision of the RN administering medicines and monitoring the patient's vital signs, pulse oximetry, telemetry and mental status throughout from the start of the procedure until the patient was taken to the recovery room.  After obtaining adequate anesthesia, the patient was prepped and draped in the standard fashion.  The right jugular vein was visualized with ultrasound and found to be widely patent. It was then accessed under direct ultrasound guidance without difficulty with the Seldinger needle and a permanent image was recorded. A J-wire was placed. After skin nick and dilatation, the  retrieval sheath was placed over the wire and advanced into the inferior vena cava. Inferior vena cava was imaged and found to be widely patent on inferior venacavogram. The filter was straight in its orientation. The retrieval snare was then placed through the sheath and the hook of the filter was snared without difficulty. The sheath was then advanced, and the filter was collapsed and brought into the sheath in its entirety. It was then removed from the body in its entirety. The retrieval sheath was then removed. Pressure was held at the access site and sterile dressing was placed. The patient was taken to the recovery room in stable condition having tolerated the procedure well.  COMPLICATIONS: None  CONDITION:  Stable   Leotis Pain 07/18/2021 10:07 AM  This note was created with Dragon Medical transcription system. Any errors in dictation are purely unintentional.

## 2021-07-18 NOTE — Progress Notes (Signed)
Attempted to contact patient at the time of her appointment.  She did not respond to phone call or Video invite.  However per review of notes in EHR patient likely underwent a procedure today.  Unknown if she is still admitted to the hospital or not.  I have left voicemail for patient.

## 2021-07-18 NOTE — Discharge Instructions (Signed)
Inferior Vena Cava Filter Removal, Care After This sheet gives you information about how to care for yourself after your procedure. Your health care provider may also give you more specific instructions. If you have problems or questions, contact your health care provider. What can I expect after the procedure? After your procedure, it is common to have: Mild pain in the area where the filter was removed. Mild bruising in the area where the filter was removed.  Follow these instructions at home: Removal  site care Follow instructions from your health care provider about how to take care of the site where a catheter was removed at your neck.. Make sure you: Wash your hands with soap and water before you change your bandage (dressing). If soap and water are not available, use hand sanitizer. Leave bandage on for 48hrs.. Check your removal site every day for signs of infection. Check for: More redness, swelling, or pain. More fluid or blood. Warmth. Pus or a bad smell. Keep the removal site clean and dry. You may shower tomorrow, do not scrub or rub over the dressing site. General instructions Take over-the-counter and prescription medicines only as told by your health care provider. Avoid heavy lifting or hard activities for 48 hours after the procedure or as told by your health care provider. Do not drive for 24 hours if you were given a a medicine to help you relax (sedative). Do not drive or use heavy machinery while taking prescription pain medicine. Do not go back to school or work until your health care provider approves. Keep all follow-up visits as told by your health care provider. This is important. Contact a health care provider if: You have more redness, swelling, or pain around your removal site. You have more fluid or blood coming from you removalr site. Your removal site feels warm to the touch. You have pus or a bad smell coming from your  removal site. You have a  fever. You are dizzy. You have nausea and vomiting. You develop a rash. Get help right away if: You develop chest pain, a cough, or difficulty breathing. You develop shortness of breath, feel faint, or pass out. You cough up blood. You have severe pain in your abdomen. You develop swelling and discoloration or pain in your legs. Your legs become pale and cold or blue. You develop weakness, difficulty moving your arms or legs, or balance problems. You develop problems with speech or vision. These symptoms may represent a serious problem that is an emergency. Do not wait to see if the symptoms will go away. Get medical help right away. Call your local emergency services (911 in the [place] Do not drive yourself to the hospital. Summary   

## 2021-07-19 ENCOUNTER — Encounter: Payer: Self-pay | Admitting: Psychiatry

## 2021-07-19 ENCOUNTER — Telehealth: Payer: Self-pay

## 2021-07-19 ENCOUNTER — Encounter (INDEPENDENT_AMBULATORY_CARE_PROVIDER_SITE_OTHER): Payer: Self-pay | Admitting: Nurse Practitioner

## 2021-07-19 ENCOUNTER — Telehealth (INDEPENDENT_AMBULATORY_CARE_PROVIDER_SITE_OTHER): Payer: BC Managed Care – PPO | Admitting: Psychiatry

## 2021-07-19 ENCOUNTER — Telehealth: Payer: Self-pay | Admitting: Psychiatry

## 2021-07-19 DIAGNOSIS — Z91199 Patient's noncompliance with other medical treatment and regimen due to unspecified reason: Secondary | ICD-10-CM

## 2021-07-19 DIAGNOSIS — Z79899 Other long term (current) drug therapy: Secondary | ICD-10-CM | POA: Diagnosis not present

## 2021-07-19 DIAGNOSIS — F332 Major depressive disorder, recurrent severe without psychotic features: Secondary | ICD-10-CM

## 2021-07-19 DIAGNOSIS — Z9111 Patient's noncompliance with dietary regimen: Secondary | ICD-10-CM

## 2021-07-19 DIAGNOSIS — G4701 Insomnia due to medical condition: Secondary | ICD-10-CM | POA: Diagnosis not present

## 2021-07-19 DIAGNOSIS — F411 Generalized anxiety disorder: Secondary | ICD-10-CM | POA: Diagnosis not present

## 2021-07-19 MED ORDER — BREXPIPRAZOLE 2 MG PO TABS: 2.0000 mg | ORAL_TABLET | Freq: Every day | ORAL | 1 refills | Status: DC

## 2021-07-19 NOTE — Progress Notes (Signed)
Virtual Visit via Video Note  I connected with Jamie [name] on 07/19/21 at  8:30 AM EDT by a video enabled telemedicine application and verified that I am speaking with the correct person using two identifiers.  Location Provider Location : [facility] Patient Location : Home  Participants: Patient , Provider   I discussed the limitations of evaluation and management by telemedicine and the availability of in person appointments. The patient expressed understanding and agreed to proceed.    I discussed the assessment and treatment plan with the patient. The patient was provided an opportunity to ask questions and all were answered. The patient agreed with the plan and demonstrated an understanding of the instructions.   The patient was advised to call back or seek an in-person evaluation if the symptoms worsen or if the condition fails to improve as anticipated.   [place] MD OP Progress Note  07/19/2021 11:30 AM Jamie [name]  MRN:  [id number]  Chief Complaint:  Chief Complaint   Follow-up; Anxiety; Depression    HPI: Jamie [name] is a 55 year old Caucasian female, married, lives in [place], has a history of MDD, insomnia, GAD, chronic pain was evaluated by telemedicine today.  Patient had missed her appointment yesterday with writer and hence this appointment was scheduled.  Patient had her total hip arthroplasty end of May.  She is currently recovering from the same.  Patient had an IVC filter removal procedure yesterday at her cardiology office, reviewed notes per Dr. Lucky Cowboy.  Patient reports she is currently struggling a lot with depressive symptoms like sadness, crying spells, racing thoughts, lack of motivation, low energy.  Patient reports she has started walking with a cane.  She feels sore. Since she had the surgery she reports pain has improved a lot.  She however reports she continues to believe she is unable to go back to her same job since it places a lot of physical stress  on her.  She went back to work on Monday and Tuesday for 6 hours since she needed to do that to keep her job.  She is planning to go back tomorrow.  Patient reports she needs to find another job and is currently having a lot of financial problems.  Her husband, her 44 year old son and her 43 year old daughter are trying to help out as much as they can.  Patient reports she worries a lot, has racing thoughts, feels nervous and fidgety often.  She has trouble relaxing.  This has been getting worse since the past few weeks.  Patient reports she also has difficulty staying asleep since the past few days.  She does have Elavil available however has been taking only 1 tablet.  Patient denies any suicidality, homicidality or perceptual disturbances.  Patient reports she could not schedule an appointment with her therapist since her phone was cut off and she had trouble paying her bills.  She however agrees to call to schedule an appointment as soon as possible.  Patient denies any other concerns today.  Visit Diagnosis:    ICD-10-CM   1. Severe episode of recurrent major depressive disorder, without psychotic features ([place])  F33.2 brexpiprazole (REXULTI) 2 MG TABS tablet    2. Insomnia due to medical condition  G47.01    pain    3. GAD (generalized anxiety disorder)  F41.1     4. High risk medication use  Z79.899 Hemoglobin A1C    Prolactin    5. Noncompliance with treatment plan  Z91.11  Past Psychiatric History:I have reviewed past psychiatric history from progress note on 03/08/2019  Past Medical History:  Past Medical History:  Diagnosis Date   Anxiety    Brachial neuritis    Cervical cancer (HCC)    Chronic neck pain    Chronic pain not due to malignancy    Depressive disorder    followed by Dr. Alethia Berthold.   Disturbance, sleep    DVT (deep venous thrombosis) (HCC)    left arm   Elevated blood pressure reading without diagnosis of hypertension    Embolism and thrombosis  of splenic artery    H/O thrombophlebitis    Infarction of spleen    Lumbosacral neuritis    Obesity, Class II, BMI 35-39.9, with comorbidity     Past Surgical History:  Procedure Laterality Date   CESAREAN SECTION  09/24/2010   IVC FILTER INSERTION N/A 05/24/2021   Procedure: IVC FILTER INSERTION;  Surgeon: Algernon Huxley, MD;  Location: [facility];  Service: Cardiovascular;  Laterality: N/A;   IVC FILTER REMOVAL N/A 07/18/2021   Procedure: IVC FILTER REMOVAL;  Surgeon: Algernon Huxley, MD;  Location: [facility];  Service: Cardiovascular;  Laterality: N/A;   TONSILLECTOMY AND ADENOIDECTOMY  09/24/2010   TOTAL HIP ARTHROPLASTY Right 05/29/2021   Procedure: TOTAL HIP ARTHROPLASTY ANTERIOR APPROACH;  Surgeon: Hessie Knows, MD;  Location: [facility];  Service: Orthopedics;  Laterality: Right;   TUBAL LIGATION  09/24/2010    Family Psychiatric History: Reviewed family psychiatric history from progress note on 03/08/2019  Family History:  Family History  Problem Relation Age of Onset   Anxiety disorder Mother    Depression Mother    Alcohol abuse Brother     Social History: Reviewed social history from progress note on 03/08/2019 Social History   Socioeconomic History   Marital status: Married    Spouse name: rodney   Number of children: 3   Years of education: Not on file   Highest education level: Some college, no degree  Occupational History    Comment: full time  Tobacco Use   Smoking status: Never   Smokeless tobacco: Never  Vaping Use   Vaping Use: Never used  Substance and Sexual Activity   Alcohol use: Yes    Alcohol/week: 0.0 standard drinks    Comment: social   Drug use: Not Currently    Types: Marijuana   Sexual activity: Yes    Partners: Male    Birth control/protection: Surgical    Comment: Tubal ligation   Other Topics Concern   Not on file  Social History Narrative   Not on file   Social Determinants of Health   Financial Resource Strain:  Not on file  Food Insecurity: Not on file  Transportation Needs: Not on file  Physical Activity: Not on file  Stress: Not on file  Social Connections: Not on file    Allergies:  Allergies  Allergen Reactions   Neuromuscular Blocking Agents Other (See Comments)    Don't work   Hydrocodone-Acetaminophen Nausea Only    Metabolic Disorder Labs: No results found for: HGBA1C, MPG No results found for: PROLACTIN No results found for: CHOL, TRIG, HDL, CHOLHDL, VLDL, LDLCALC No results found for: TSH  Therapeutic Level Labs: No results found for: LITHIUM No results found for: VALPROATE No components found for:  CBMZ  Current Medications: Current Outpatient Medications  Medication Sig Dispense Refill   brexpiprazole (REXULTI) 2 MG TABS tablet Take 1 tablet (2 mg  total) by mouth daily. 30 tablet 1   amitriptyline (ELAVIL) 10 MG tablet Take 1-2 tablets (10-20 mg total) by mouth at bedtime as needed for sleep. 60 tablet 1   apixaban (ELIQUIS) 2.5 MG TABS tablet Take 1 tablet (2.5 mg total) by mouth every 12 (twelve) hours. (Patient not taking: Reported on 07/18/2021) 60 tablet 3   citalopram (CELEXA) 40 MG tablet Take 1 tablet (40 mg total) by mouth daily. 30 tablet 1   cyclobenzaprine (FLEXERIL) 5 MG tablet Take 1 tablet by mouth 3 (three) times daily as needed. (Patient not taking: Reported on 07/18/2021)     docusate sodium (COLACE) 100 MG capsule Take 1 capsule (100 mg total) by mouth 2 (two) times daily. (Patient not taking: Reported on 07/18/2021) 10 capsule 0   gabapentin (NEURONTIN) 800 MG tablet Take 1 tablet (800 mg total) by mouth 2 (two) times daily. 60 tablet 1   methocarbamol (ROBAXIN) 500 MG tablet Take 1 tablet (500 mg total) by mouth every 6 (six) hours as needed for muscle spasms. (Patient not taking: Reported on 07/18/2021) 30 tablet 0   oxyCODONE (OXY IR/ROXICODONE) 5 MG immediate release tablet Take 1-2 tablets (5-10 mg total) by mouth every 4 (four) hours as needed for  moderate pain (pain score 4-6). (Patient not taking: Reported on 07/18/2021) 30 tablet 0   propranolol (INDERAL) 10 MG tablet Take 1 tablet twice a day as needed for breakthrough anxiety 60 tablet 1   traMADol (ULTRAM) 50 MG tablet Take 1 tablet (50 mg total) by mouth every 6 (six) hours as needed. (Patient not taking: Reported on 07/18/2021) 30 tablet 0   No current facility-administered medications for this visit.     Musculoskeletal: Strength & Muscle Tone:  UTA Gait & Station:  UTA Patient leans: N/A  Psychiatric Specialty Exam: Review of Systems  Constitutional:  Positive for fatigue.  Musculoskeletal:        Rt.hip joint soreness  Psychiatric/Behavioral:  Positive for decreased concentration, dysphoric mood and sleep disturbance. The patient is nervous/anxious.   All other systems reviewed and are negative.  There were no vitals taken for this visit.There is no height or weight on file to calculate BMI.  General Appearance: Casual  Eye Contact:  Fair  Speech:  Clear and Coherent  Volume:  Normal  Mood:  Anxious and Depressed  Affect:  Tearful  Thought Process:  Goal Directed and Descriptions of Associations: Intact  Orientation:  Full (Time, Place, and Person)  Thought Content: Logical   Suicidal Thoughts:  No  Homicidal Thoughts:  No  Memory:  Immediate;   Fair Recent;   Fair Remote;   Fair  Judgement:  Fair  Insight:  Fair  Psychomotor Activity:  Normal  Concentration:  Concentration: Fair and Attention Span: Fair  Recall:  [employer] of Knowledge: Fair  Language: Fair  Akathisia:  No  Handed:  Right  AIMS (if indicated): not done  Assets:  Communication Skills Desire for Improvement Housing Intimacy Social Support  ADL's:  Intact  Cognition: WNL  Sleep:  Poor   Screenings: GAD-7    Flowsheet Row Video Visit from 07/19/2021 in [facility] Video Visit from 06/13/2021 in [facility] Video Visit from  05/17/2021 in [facility]  Total GAD-7 Score 19 12 20       PHQ2-9    Flowsheet Row Video Visit from 07/19/2021 in [place] Video Visit from 06/13/2021 in [facility] Video  Visit from 05/17/2021 in [facility] Video Visit from 05/03/2021 in [facility] Video Visit from 04/02/2021 in [facility]  PHQ-2 Total Score 5 1 4 5 1   PHQ-9 Total Score 18 -- 13 12 --      Flowsheet Row Video Visit from 07/19/2021 in [facility] Admission (Discharged) from 05/29/2021 in [facility] [place] (1A) Pre-Admission Testing 60 from 05/21/2021 in [facility] CATEGORY No Risk No Risk No Risk        Assessment and Plan: SONG MYRE is a 55 year old Caucasian female who has a history of MDD, insomnia, chronic pain was evaluated by telemedicine today.  Patient is currently status post total hip arthroplasty, recovering, however continues to struggle with depression, anxiety, sleep problems and has multiple psychosocial stressors including financial problems.  Plan as noted below.  Plan MDD-unstable Celexa 40 mg p.o. daily Increase Rexulti to 2 milligrams p.o. nightly.  Insomnia-unstable Increase Elavil to 20 mg p.o. nightly as needed Elavil prescribed as 10 to 20 mg p.o. nightly as needed, she has been taking only 1 tablet. She will also need sufficient pain management.  GAD-unstable Celexa 40 mg p.o. daily Gabapentin 800 mg p.o. twice daily  High risk medication use-will order hemoglobin A1c, prolactin.  She will go to [facility] .  Noncompliance with treatment plan-patient has been noncompliant with psychotherapy session.  I have communicated with therapist Ms. Christina Hussami as well as private desk to schedule this patient  for an appointment.  Patient encouraged to have more frequent psychotherapy sessions given her worsening mood symptoms.  Follow-up in clinic in 2 weeks or sooner if needed.  This note was generated in part or whole with voice recognition software. Voice recognition is usually quite accurate but there are transcription errors that can and very often do occur. I apologize for any typographical errors that were not detected and corrected.     Ursula Alert, MD 07/19/2021, 11:30 AM

## 2021-07-19 NOTE — Telephone Encounter (Signed)
faxed and confimed labwork orders for hemo a1c and prolactin dx: z79.899

## 2021-07-19 NOTE — Telephone Encounter (Signed)
Called to schedule therapy appt, no answer. Unable to leave vm.

## 2021-07-29 ENCOUNTER — Other Ambulatory Visit: Payer: Self-pay | Admitting: Psychiatry

## 2021-07-29 DIAGNOSIS — F331 Major depressive disorder, recurrent, moderate: Secondary | ICD-10-CM

## 2021-07-29 DIAGNOSIS — F411 Generalized anxiety disorder: Secondary | ICD-10-CM

## 2021-07-31 ENCOUNTER — Other Ambulatory Visit: Payer: Self-pay

## 2021-08-01 ENCOUNTER — Telehealth: Payer: Self-pay

## 2021-08-01 DIAGNOSIS — F411 Generalized anxiety disorder: Secondary | ICD-10-CM

## 2021-08-01 MED ORDER — CITALOPRAM HYDROBROMIDE 40 MG PO TABS: 40.0000 mg | ORAL_TABLET | Freq: Every day | ORAL | 1 refills | Status: DC

## 2021-08-01 MED ORDER — GABAPENTIN 800 MG PO TABS: 800.0000 mg | ORAL_TABLET | Freq: Two times a day (BID) | ORAL | 1 refills | Status: DC

## 2021-08-01 NOTE — Telephone Encounter (Signed)
pt needs refill on the citalopram, and gabapentin

## 2021-08-01 NOTE — Telephone Encounter (Signed)
I have sent Celexa and gabapentin to pharmacy.

## 2021-08-02 ENCOUNTER — Telehealth: Payer: BC Managed Care – PPO | Admitting: Psychiatry

## 2021-08-07 ENCOUNTER — Other Ambulatory Visit: Payer: Self-pay

## 2021-08-07 ENCOUNTER — Ambulatory Visit (LOCAL_COMMUNITY_HEALTH_CENTER): Payer: BC Managed Care – PPO | Admitting: Nurse Practitioner

## 2021-08-07 DIAGNOSIS — Z23 Encounter for immunization: Secondary | ICD-10-CM

## 2021-08-07 DIAGNOSIS — Z111 Encounter for screening for respiratory tuberculosis: Secondary | ICD-10-CM | POA: Diagnosis not present

## 2021-08-07 NOTE — Progress Notes (Signed)
55 year old female in today for vaccines and a PPD. Gregary Cromer, RN

## 2021-08-10 ENCOUNTER — Ambulatory Visit (LOCAL_COMMUNITY_HEALTH_CENTER): Payer: BC Managed Care – PPO | Admitting: Nurse Practitioner

## 2021-08-10 ENCOUNTER — Other Ambulatory Visit: Payer: BC Managed Care – PPO

## 2021-08-10 DIAGNOSIS — Z111 Encounter for screening for respiratory tuberculosis: Secondary | ICD-10-CM

## 2021-08-10 LAB — TB SKIN TEST
Induration: 0 mm
TB Skin Test: NEGATIVE

## 2021-08-13 ENCOUNTER — Other Ambulatory Visit: Payer: Self-pay

## 2021-08-13 NOTE — Progress Notes (Signed)
Patient in clinic today for a PPD reading. PPD reading 69m. AGregary Cromer RN

## 2021-08-21 NOTE — Addendum Note (Signed)
Addended by: Gregary Cromer on: 08/21/2021 04:16 PM   Modules accepted: Level of Service

## 2021-08-22 ENCOUNTER — Encounter: Payer: Self-pay | Admitting: Psychiatry

## 2021-08-22 ENCOUNTER — Other Ambulatory Visit: Payer: Self-pay

## 2021-08-22 ENCOUNTER — Telehealth (INDEPENDENT_AMBULATORY_CARE_PROVIDER_SITE_OTHER): Payer: BC Managed Care – PPO | Admitting: Psychiatry

## 2021-08-22 DIAGNOSIS — F3341 Major depressive disorder, recurrent, in partial remission: Secondary | ICD-10-CM | POA: Diagnosis not present

## 2021-08-22 DIAGNOSIS — G4701 Insomnia due to medical condition: Secondary | ICD-10-CM

## 2021-08-22 DIAGNOSIS — F411 Generalized anxiety disorder: Secondary | ICD-10-CM | POA: Diagnosis not present

## 2021-08-22 DIAGNOSIS — F332 Major depressive disorder, recurrent severe without psychotic features: Secondary | ICD-10-CM | POA: Insufficient documentation

## 2021-08-22 MED ORDER — AMITRIPTYLINE HCL 10 MG PO TABS: 10.0000 mg | ORAL_TABLET | Freq: Every evening | ORAL | 1 refills | Status: DC | PRN

## 2021-08-22 NOTE — Progress Notes (Signed)
Virtual Visit via Video Note  I connected with Jamie [name] on 08/22/21 at  8:30 AM EDT by a video enabled telemedicine application and verified that I am speaking with the correct person using two identifiers.  Location Provider Location : [facility] Patient Location : Home  Participants: Patient , Provider   I discussed the limitations of evaluation and management by telemedicine and the availability of in person appointments. The patient expressed understanding and agreed to proceed.   I discussed the assessment and treatment plan with the patient. The patient was provided an opportunity to ask questions and all were answered. The patient agreed with the plan and demonstrated an understanding of the instructions.   The patient was advised to call back or seek an in-person evaluation if the symptoms worsen or if the condition fails to improve as anticipated.   [facility] MD OP Progress Note  08/22/2021 9:45 AM Jamie [name]  MRN:  [id number]  Chief Complaint:  Chief Complaint   Follow-up; Depression; Anxiety    HPI: Jamie [name] is a 55 year old Caucasian female, married, lives in [place], has a history of MDD, insomnia, GAD, chronic pain was evaluated by telemedicine today.  Patient today reports she continues to recover from her total hip arthroplasty.  Her surgical pain had improved a lot however since the past 2 weeks she has burning pain of her right leg.  That does affect her on a daily basis.  She reports she also has started working 8 hours this week.  That has been exhausting.  Prior to that she was on restricted work hours and had to work only 6 hours.  She did well with that.  She reports since being on the Rexulti higher dosage her depressive symptoms have improved.  She continues to have crying spells although they are improving.  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  Patient reports sleep is good and she currently takes a higher dosage of Elavil as needed.  She denies any side effects to her medications.  Patient continues to have social support system from her husband.  She denies suicidality, homicidality or perceptual disturbances.  Patient is currently applying for disability and reports she has upcoming disability evaluation.  Patient denies any other concerns today.  Visit Diagnosis:    ICD-10-CM   1. MDD (major depressive disorder), recurrent, in partial remission ([place])  F33.41     2. Insomnia due to medical condition  G47.01 amitriptyline (ELAVIL) 10 MG tablet   pain    3. GAD (generalized anxiety disorder)  F41.1       Past Psychiatric History: Reviewed past psychiatric history from progress note on 03/08/2019.  Past Medical History:  Past Medical History:  Diagnosis Date   Anxiety    Brachial neuritis    Cervical cancer (HCC)    Chronic neck pain    Chronic pain not due to malignancy    Depressive disorder    followed by Dr. Alethia Berthold.   Disturbance, sleep    DVT (deep venous thrombosis) (HCC)    left arm   Elevated blood pressure reading without diagnosis of hypertension    Embolism and thrombosis of splenic artery    H/O thrombophlebitis    Infarction of spleen    Lumbosacral neuritis    Obesity, Class II, BMI 35-39.9, with comorbidity     Past Surgical History:  Procedure Laterality Date   CESAREAN SECTION  09/24/2010   IVC FILTER INSERTION N/A 05/24/2021   Procedure: IVC FILTER INSERTION;  Surgeon: Algernon Huxley, MD;  Location: [facility];  Service: Cardiovascular;  Laterality: N/A;   IVC FILTER REMOVAL N/A 07/18/2021   Procedure: IVC FILTER REMOVAL;  Surgeon: Algernon Huxley, MD;  Location: [facility];  Service: Cardiovascular;  Laterality: N/A;   TONSILLECTOMY AND ADENOIDECTOMY  09/24/2010   TOTAL HIP ARTHROPLASTY Right 05/29/2021    Procedure: TOTAL HIP ARTHROPLASTY ANTERIOR APPROACH;  Surgeon: Hessie Knows, MD;  Location: [facility];  Service: Orthopedics;  Laterality: Right;   TUBAL LIGATION  09/24/2010    Family Psychiatric History: Reviewed family psychiatric history from progress note on 03/08/2019.  Family History:  Family History  Problem Relation Age of Onset   Anxiety disorder Mother    Depression Mother    Alcohol abuse Brother     Social History: Reviewed social history from progress note on 03/08/2019. Social History   Socioeconomic History   Marital status: Married    Spouse name: rodney   Number of children: 3   Years of education: Not on file   Highest education level: Some college, no degree  Occupational History    Comment: full time  Tobacco Use   Smoking status: Never   Smokeless tobacco: Never  Vaping Use   Vaping Use: Never used  Substance and Sexual Activity   Alcohol use: Yes    Alcohol/week: 0.0 standard drinks    Comment: social   Drug use: Not Currently    Types: Marijuana   Sexual activity: Yes    Partners: Male    Birth control/protection: Surgical    Comment: Tubal ligation   Other Topics Concern   Not on file  Social History Narrative   Not on file   Social Determinants of Health   Financial Resource Strain: Not on file  Food Insecurity: Not on file  Transportation Needs: Not on file  Physical Activity: Not on file  Stress: Not on file  Social Connections: Not on file    Allergies:  Allergies  Allergen Reactions   Neuromuscular Blocking Agents Other (See Comments)    Don't work   Hydrocodone-Acetaminophen Nausea Only    Metabolic Disorder Labs: No results found for: HGBA1C, MPG No results found for: PROLACTIN No results found for: CHOL, TRIG, HDL, CHOLHDL, VLDL, LDLCALC No results found for: TSH  Therapeutic Level Labs: No results found for: LITHIUM No results found for: VALPROATE No components found for:  CBMZ  Current Medications: Current  Outpatient Medications  Medication Sig Dispense Refill   celecoxib (CELEBREX) 200 MG capsule Take by mouth.     amitriptyline (ELAVIL) 10 MG tablet Take 1-2 tablets (10-20 mg total) by mouth at bedtime as needed for sleep. 60 tablet 1   apixaban (ELIQUIS) 2.5 MG TABS tablet Take 1 tablet (2.5 mg total) by mouth every 12 (twelve) hours. (Patient not taking: Reported on 07/18/2021) 60 tablet 3   BINAXNOW COVID-19 AG HOME TEST KIT Use as Directed on the Package     brexpiprazole (REXULTI) 2 MG TABS tablet Take 1 tablet (2 mg total) by mouth daily. 30 tablet 1   citalopram (CELEXA) 40 MG tablet Take 1 tablet (40 mg total) by mouth daily. 30 tablet 1   cyclobenzaprine (FLEXERIL) 5 MG tablet Take 1 tablet by mouth 3 (three) times daily as needed. (Patient not taking: Reported on 07/18/2021)     docusate  sodium (COLACE) 100 MG capsule Take 1 capsule (100 mg total) by mouth 2 (two) times daily. (Patient not taking: Reported on 07/18/2021) 10 capsule 0   gabapentin (NEURONTIN) 800 MG tablet Take 1 tablet (800 mg total) by mouth 2 (two) times daily. 60 tablet 1   methocarbamol (ROBAXIN) 500 MG tablet Take 1 tablet (500 mg total) by mouth every 6 (six) hours as needed for muscle spasms. (Patient not taking: Reported on 07/18/2021) 30 tablet 0   oxyCODONE (OXY IR/ROXICODONE) 5 MG immediate release tablet Take 1-2 tablets (5-10 mg total) by mouth every 4 (four) hours as needed for moderate pain (pain score 4-6). (Patient not taking: Reported on 07/18/2021) 30 tablet 0   propranolol (INDERAL) 10 MG tablet TAKE 1 TABLET BY MOUTH TWICE DAILY AS NEEDED FOR BREAKTHROUGH ANXIETY 60 tablet 0   traMADol (ULTRAM) 50 MG tablet Take 1 tablet (50 mg total) by mouth every 6 (six) hours as needed. (Patient not taking: Reported on 07/18/2021) 30 tablet 0   No current facility-administered medications for this visit.     Musculoskeletal: Strength & Muscle Tone:  UTA Gait & Station:  Walks with cane Patient leans:  N/A  Psychiatric Specialty Exam: Review of Systems  Musculoskeletal:        Rt.Leg pain - burning  Psychiatric/Behavioral:  The patient is nervous/anxious.   All other systems reviewed and are negative.  There were no vitals taken for this visit.There is no height or weight on file to calculate BMI.  General Appearance: Casual  Eye Contact:  Fair  Speech:  Clear and Coherent  Volume:  Normal  Mood:  Anxious  Affect:  Congruent  Thought Process:  Goal Directed and Descriptions of Associations: Intact  Orientation:  Full (Time, Place, and Person)  Thought Content: Logical   Suicidal Thoughts:  No  Homicidal Thoughts:  No  Memory:  Immediate;   Fair Recent;   Fair Remote;   Fair  Judgement:  Fair  Insight:  Fair  Psychomotor Activity:  Normal  Concentration:  Concentration: Fair and Attention Span: Fair  Recall:  [employer] of Knowledge: Fair  Language: Fair  Akathisia:  No  Handed:  Right  AIMS (if indicated): done  Assets:  Communication Skills Desire for Improvement Housing Social Support  ADL's:  Intact  Cognition: WNL  Sleep:  Fair   Screenings: GAD-7    Flowsheet Row Video Visit from 08/22/2021 in [place] Video Visit from 07/19/2021 in [place] Video Visit from 06/13/2021 in [place] Video Visit from 05/17/2021 in [place]  Total GAD-7 Score _0 PHQ2-9    Flowsheet Row Video Visit from 08/22/2021 in [place] Video Visit from 07/19/2021 in [place] Video Visit from 06/13/2021 in [place] Video Visit from 05/17/2021 in [place] Video Visit from 05/03/2021 in [facility]  PHQ-2 Total Score _1 PHQ-9 Total Score 6 18 -- 13 12      Flowsheet Row Video Visit from 08/22/2021  in [place] Video Visit from 07/19/2021 in [facility] Admission (Discharged) from 05/29/2021 in [facility] [place] (1A)  C-SSRS RISK CATEGORY No Risk No Risk No Risk        Assessment and Plan: GIANNAMARIE PAULUS is a 55 year old Caucasian female who has a history of  MDD, chronic pain, insomnia was evaluated by telemedicine today.  Patient is status post total hip arthroplasty, currently recovering although she continues to be in pain.  She does have psychosocial stressors including financial problems, job related stressors and medical problems.  She will benefit from the following plan.  Plan MDD-partial remission PHQ-9 today equals 6 Celexa 40 mg p.o. daily Rexulti 2 mg p.o. nightly  Insomnia-improving Elavil 10 - 20 mg p.o. nightly as needed She will benefit from sufficient pain management  GAD-improving GAD-7 equals 12 Celexa 40 mg p.o. daily Gabapentin 800 mg p.o. twice daily Encouraged to continue psychotherapy sessions  High risk medication use-pending labs-hemoglobin A1c, prolactin.  Patient agrees to get it done.  Lab order faxed to [facility] lab.   Follow-up in clinic in 1 month or sooner if needed.  This note was generated in part or whole with voice recognition software. Voice recognition is usually quite accurate but there are transcription errors that can and very often do occur. I apologize for any typographical errors that were not detected and corrected.       Ursula Alert, MD 08/22/2021, 9:45 AM

## 2021-09-16 IMAGING — XA DG HIP (WITH PELVIS) OPERATIVE*R*
3 series · 12 of 12 positions shown · non-contrast
Comparison: None.

CLINICAL DATA: Hip replacement.

EXAM:
OPERATIVE RIGHT HIP (WITH PELVIS IF PERFORMED) 3 VIEWS
TECHNIQUE: Fluoroscopic spot image(s) were submitted for interpretation
post-operatively.

[Series 1: ortho standard · 4 of 9 frames shown (1 of 3)]
[frame 2/9]
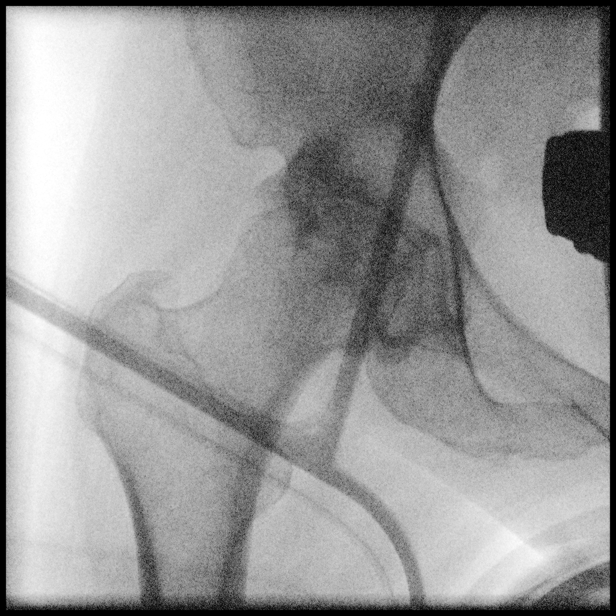
[frame 5/9]
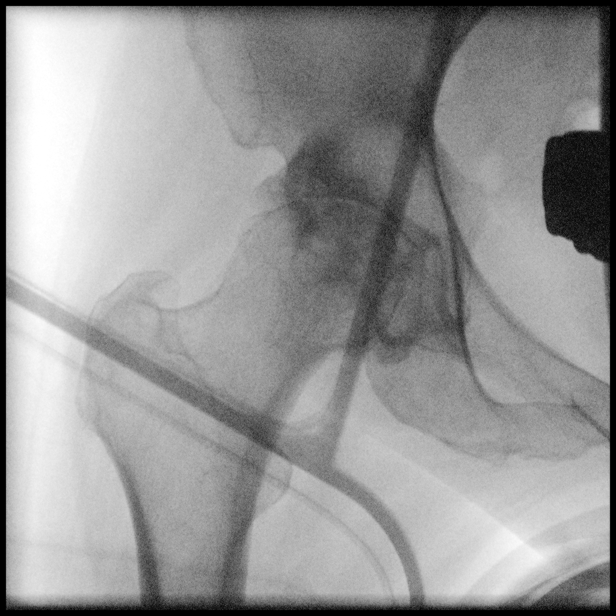
[frame 8/9]
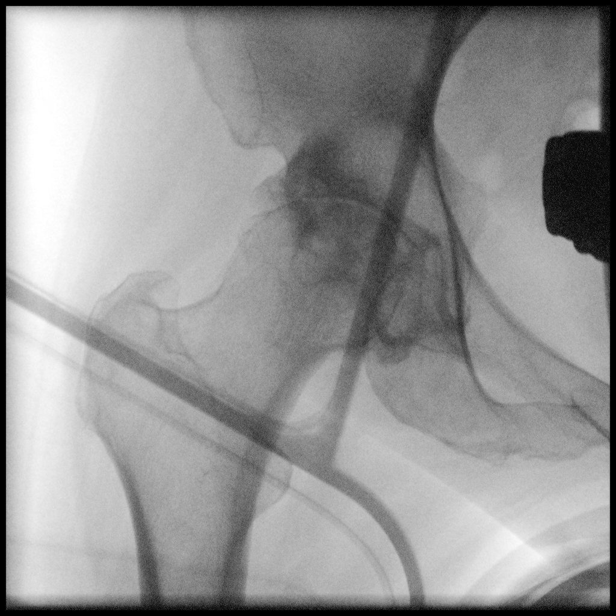
[frame 9/9]
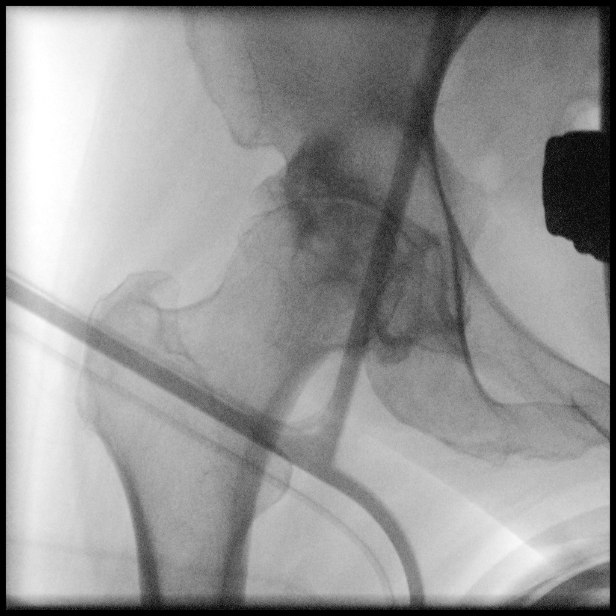

[Series 3: ortho standard · 4 of 10 frames shown (2 of 3)]
[frame 1/10]
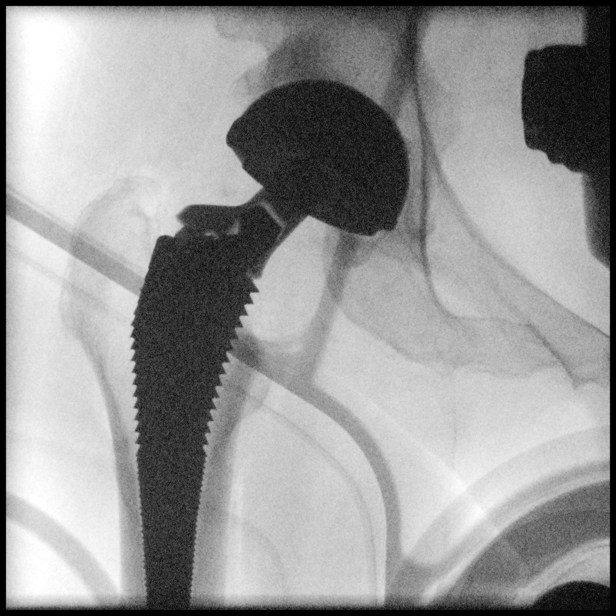
[frame 2/10]
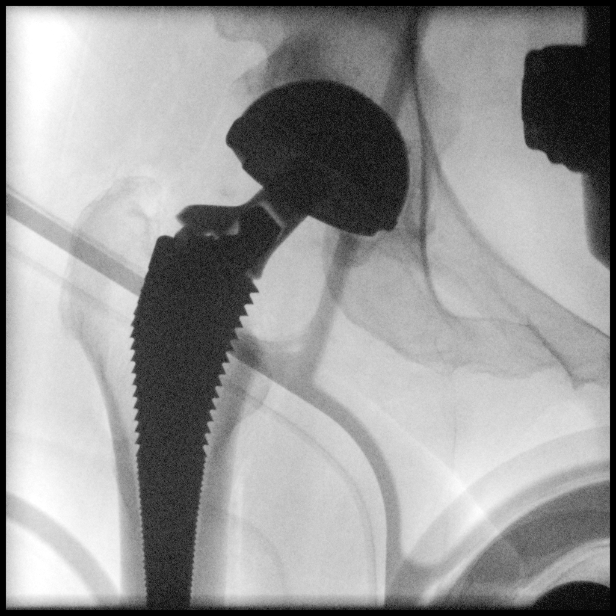
[frame 6/10]
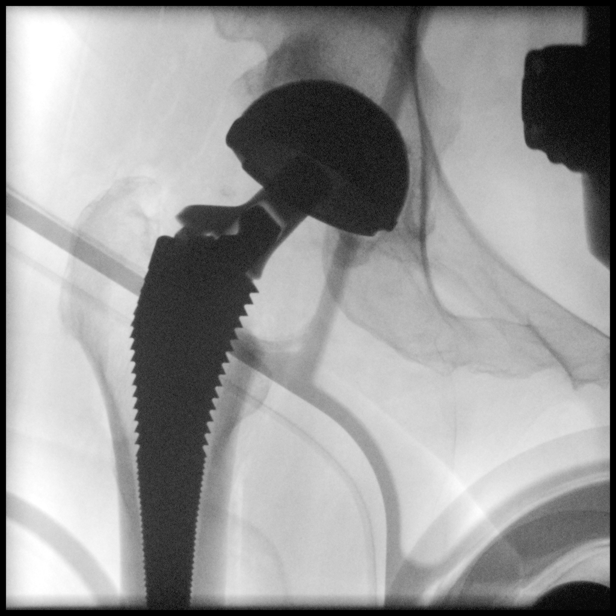
[frame 9/10]
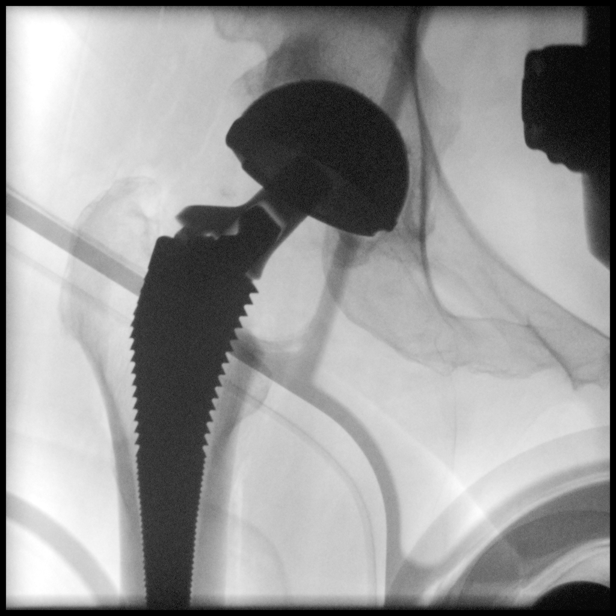

[Series 4: ortho standard · 4 of 10 frames shown (3 of 3)]
[frame 1/10]
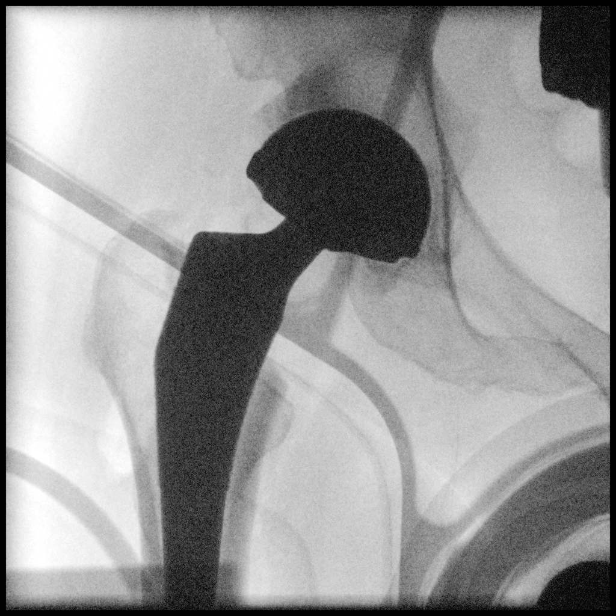
[frame 2/10]
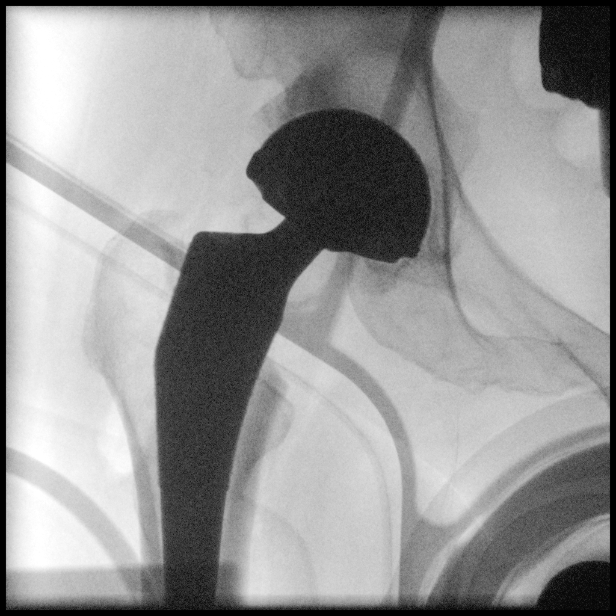
[frame 6/10]
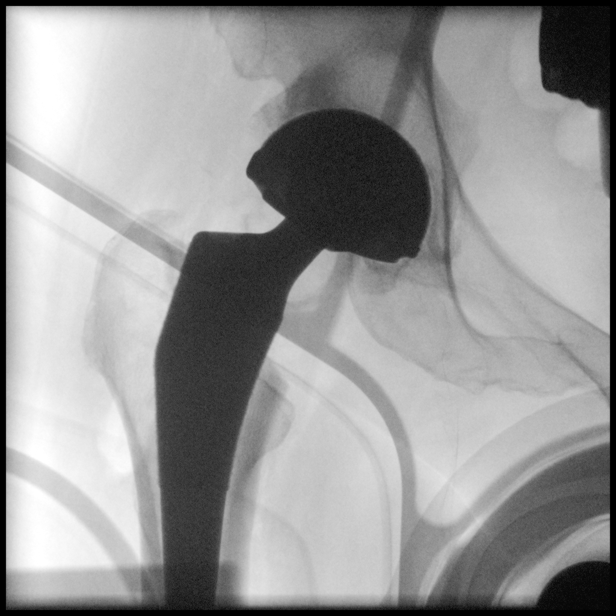
[frame 9/10]
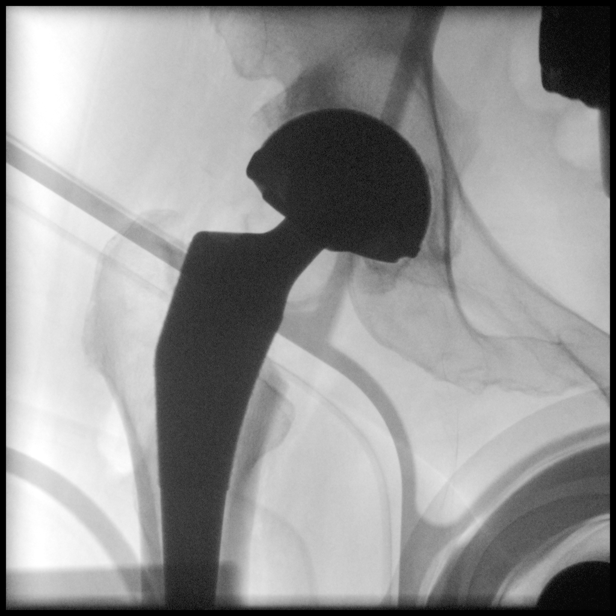

[12 of 12 positions shown; findings below may reference images not displayed]

FINDINGS: Three intraoperative spot fluoroscopic images demonstrate
performance of a right total hip arthroplasty. No fracture is
identified on these limited images.
IMPRESSION: Intraoperative images during right hip arthroplasty.

## 2021-09-17 ENCOUNTER — Ambulatory Visit (LOCAL_COMMUNITY_HEALTH_CENTER): Payer: BC Managed Care – PPO

## 2021-09-17 ENCOUNTER — Other Ambulatory Visit: Payer: Self-pay

## 2021-09-17 DIAGNOSIS — Z23 Encounter for immunization: Secondary | ICD-10-CM | POA: Diagnosis not present

## 2021-09-17 NOTE — Progress Notes (Signed)
Per Epic (historical immunizations), client had Tdap in 2011. Client reports thinks Tdap is current. States has appt next with her PCP and she will check with Dr. Blane Ohara records regarding if tetanus containing vaccine is curent. Rich Number, RN

## 2021-09-24 ENCOUNTER — Telehealth: Payer: BC Managed Care – PPO | Admitting: Psychiatry

## 2021-10-04 ENCOUNTER — Encounter: Payer: Self-pay | Admitting: Psychiatry

## 2021-10-04 ENCOUNTER — Other Ambulatory Visit: Payer: Self-pay

## 2021-10-04 ENCOUNTER — Telehealth (INDEPENDENT_AMBULATORY_CARE_PROVIDER_SITE_OTHER): Payer: BC Managed Care – PPO | Admitting: Psychiatry

## 2021-10-04 DIAGNOSIS — F3342 Major depressive disorder, recurrent, in full remission: Secondary | ICD-10-CM | POA: Diagnosis not present

## 2021-10-04 DIAGNOSIS — F411 Generalized anxiety disorder: Secondary | ICD-10-CM

## 2021-10-04 DIAGNOSIS — Z91199 Patient's noncompliance with other medical treatment and regimen due to unspecified reason: Secondary | ICD-10-CM | POA: Diagnosis not present

## 2021-10-04 DIAGNOSIS — G4701 Insomnia due to medical condition: Secondary | ICD-10-CM | POA: Diagnosis not present

## 2021-10-04 DIAGNOSIS — F3341 Major depressive disorder, recurrent, in partial remission: Secondary | ICD-10-CM | POA: Insufficient documentation

## 2021-10-04 MED ORDER — GABAPENTIN 800 MG PO TABS: 800.0000 mg | ORAL_TABLET | Freq: Two times a day (BID) | ORAL | 1 refills | Status: DC

## 2021-10-04 MED ORDER — CITALOPRAM HYDROBROMIDE 40 MG PO TABS: 40.0000 mg | ORAL_TABLET | Freq: Every day | ORAL | 1 refills | Status: DC

## 2021-10-04 MED ORDER — BREXPIPRAZOLE 2 MG PO TABS: 2.0000 mg | ORAL_TABLET | Freq: Every day | ORAL | 1 refills | Status: DC

## 2021-10-04 NOTE — Progress Notes (Signed)
Virtual Visit via Video Note  I connected with Jamie [name] on 10/04/21 at  9:00 AM EDT by a video enabled telemedicine application and verified that I am speaking with the correct person using two identifiers. Location Provider Location : [facility] Patient Location : work  Participants: Patient , Provider    I discussed the limitations of evaluation and management by telemedicine and the availability of in person appointments. The patient expressed understanding and agreed to proceed.   I discussed the assessment and treatment plan with the patient. The patient was provided an opportunity to ask questions and all were answered. The patient agreed with the plan and demonstrated an understanding of the instructions.   The patient was advised to call back or seek an in-person evaluation if the symptoms worsen or if the condition fails to improve as anticipated.   [place] MD OP Progress Note  10/04/2021 9:27 AM KC SEDLAK  MRN:  [id number]  Chief Complaint:  Chief Complaint   Follow-up; Anxiety; Depression    HPI: Jamie [name] is a 55 year old Caucasian female, married, lives in [place], has a history of MDD, insomnia, GAD, chronic pain was evaluated by telemedicine today.  Patient today reports she is currently back at work full-time.  She reports that is very stressful since her work is hard and also it places a strain on her hip and back.  She does have lower back pain since the past few days and is planning to talk to her provider.  She reports she has has been having anxiety and irritability especially at work.  Patient denies any depression or sadness.  Denies suicidality, homicidality or perceptual disturbances.  Patient has not been able to have psychotherapy sessions, reports her schedule at work keeps changing and she was unable to schedule a visit.  She also has been noncompliant with labs.  She reports she recently tested positive for flu and hence could not get her labs  done.  She however agrees to get it done.  Patient denies any side effects to medications.  Reports she is compliant.  Patient denies any other concerns today.  Visit Diagnosis:    ICD-10-CM   1. MDD (major depressive disorder), recurrent, in full remission ([place])  F33.42 brexpiprazole (REXULTI) 2 MG TABS tablet    2. Insomnia due to medical condition  G47.01    pain    3. GAD (generalized anxiety disorder)  F41.1 citalopram (CELEXA) 40 MG tablet    gabapentin (NEURONTIN) 800 MG tablet    4. Noncompliance with treatment plan  Z91.199       Past Psychiatric History: Reviewed past psychiatric history from progress note on 03/08/2019  Past Medical History:  Past Medical History:  Diagnosis Date   Anxiety    Brachial neuritis    Cervical cancer (HCC)    Chronic neck pain    Chronic pain not due to malignancy    Depressive disorder    followed by Dr. Alethia Berthold.   Disturbance, sleep    DVT (deep venous thrombosis) (HCC)    left arm   Elevated blood pressure reading without diagnosis of hypertension    Embolism and thrombosis of splenic artery    H/O thrombophlebitis    Infarction of spleen    Lumbosacral neuritis    Obesity, Class II, BMI 35-39.9, with comorbidity     Past Surgical History:  Procedure Laterality Date   CESAREAN SECTION  09/24/2010   IVC FILTER INSERTION N/A 05/24/2021   Procedure:  IVC FILTER INSERTION;  Surgeon: Algernon Huxley, MD;  Location: [facility];  Service: Cardiovascular;  Laterality: N/A;   IVC FILTER REMOVAL N/A 07/18/2021   Procedure: IVC FILTER REMOVAL;  Surgeon: Algernon Huxley, MD;  Location: [facility];  Service: Cardiovascular;  Laterality: N/A;   TONSILLECTOMY AND ADENOIDECTOMY  09/24/2010   TOTAL HIP ARTHROPLASTY Right 05/29/2021   Procedure: TOTAL HIP ARTHROPLASTY ANTERIOR APPROACH;  Surgeon: Hessie Knows, MD;  Location: [facility];  Service: Orthopedics;  Laterality: Right;   TUBAL LIGATION  09/24/2010    Family  Psychiatric History: Reviewed family psychiatric history from progress note on 03/08/2019  Family History:  Family History  Problem Relation Age of Onset   Anxiety disorder Mother    Depression Mother    Alcohol abuse Brother     Social History: Reviewed social history from progress note on 03/08/2019 Social History   Socioeconomic History   Marital status: Married    Spouse name: rodney   Number of children: 3   Years of education: Not on file   Highest education level: Some college, no degree  Occupational History    Comment: full time  Tobacco Use   Smoking status: Never   Smokeless tobacco: Never  Vaping Use   Vaping Use: Never used  Substance and Sexual Activity   Alcohol use: Yes    Alcohol/week: 0.0 standard drinks    Comment: social   Drug use: Not Currently    Types: Marijuana   Sexual activity: Yes    Partners: Male    Birth control/protection: Surgical    Comment: Tubal ligation   Other Topics Concern   Not on file  Social History Narrative   Not on file   Social Determinants of Health   Financial Resource Strain: Not on file  Food Insecurity: Not on file  Transportation Needs: Not on file  Physical Activity: Not on file  Stress: Not on file  Social Connections: Not on file    Allergies:  Allergies  Allergen Reactions   Hydrocodone-Acetaminophen Nausea Only   Neuromuscular Blocking Agents Other (See Comments)    Don't work    Metabolic Disorder Labs: No results found for: HGBA1C, MPG No results found for: PROLACTIN No results found for: CHOL, TRIG, HDL, CHOLHDL, VLDL, LDLCALC No results found for: TSH  Therapeutic Level Labs: No results found for: LITHIUM No results found for: VALPROATE No components found for:  CBMZ  Current Medications: Current Outpatient Medications  Medication Sig Dispense Refill   amitriptyline (ELAVIL) 10 MG tablet Take 1-2 tablets (10-20 mg total) by mouth at bedtime as needed for sleep. 60 tablet 1   apixaban  (ELIQUIS) 2.5 MG TABS tablet Take 1 tablet (2.5 mg total) by mouth every 12 (twelve) hours. (Patient not taking: Reported on 07/18/2021) 60 tablet 3   BINAXNOW COVID-19 AG HOME TEST KIT Use as Directed on the Package     brexpiprazole (REXULTI) 2 MG TABS tablet Take 1 tablet (2 mg total) by mouth daily. 30 tablet 1   celecoxib (CELEBREX) 200 MG capsule Take by mouth.     citalopram (CELEXA) 40 MG tablet Take 1 tablet (40 mg total) by mouth daily. 30 tablet 1   cyclobenzaprine (FLEXERIL) 5 MG tablet Take 1 tablet by mouth 3 (three) times daily as needed. (Patient not taking: Reported on 07/18/2021)     docusate sodium (COLACE) 100 MG capsule Take 1 capsule (100 mg total) by mouth 2 (two) times daily. (Patient not  taking: Reported on 07/18/2021) 10 capsule 0   gabapentin (NEURONTIN) 800 MG tablet Take 1 tablet (800 mg total) by mouth 2 (two) times daily. 60 tablet 1   methocarbamol (ROBAXIN) 500 MG tablet Take 1 tablet (500 mg total) by mouth every 6 (six) hours as needed for muscle spasms. (Patient not taking: Reported on 07/18/2021) 30 tablet 0   oxyCODONE (OXY IR/ROXICODONE) 5 MG immediate release tablet Take 1-2 tablets (5-10 mg total) by mouth every 4 (four) hours as needed for moderate pain (pain score 4-6). (Patient not taking: Reported on 07/18/2021) 30 tablet 0   propranolol (INDERAL) 10 MG tablet TAKE 1 TABLET BY MOUTH TWICE DAILY AS NEEDED FOR BREAKTHROUGH ANXIETY 60 tablet 0   traMADol (ULTRAM) 50 MG tablet Take 1 tablet (50 mg total) by mouth every 6 (six) hours as needed. (Patient not taking: Reported on 07/18/2021) 30 tablet 0   No current facility-administered medications for this visit.     Musculoskeletal: Strength & Muscle Tone:  UTA Gait & Station:  Seated Patient leans: N/A  Psychiatric Specialty Exam: Review of Systems  Psychiatric/Behavioral:  The patient is nervous/anxious.   All other systems reviewed and are negative.  Last menstrual period 02/18/2018.There is no height or  weight on file to calculate BMI.  General Appearance: Casual  Eye Contact:  Fair  Speech:  Clear and Coherent  Volume:  Normal  Mood:  Anxious  Affect:  Congruent  Thought Process:  Goal Directed and Descriptions of Associations: Intact  Orientation:  Full (Time, Place, and Person)  Thought Content: Logical   Suicidal Thoughts:  No  Homicidal Thoughts:  No  Memory:  Immediate;   Fair Recent;   Fair Remote;   Fair  Judgement:  Fair  Insight:  Fair  Psychomotor Activity:  Normal  Concentration:  Concentration: Fair and Attention Span: Fair  Recall:  [employer] of Knowledge: Fair  Language: Fair  Akathisia:  No  Handed:  Right  AIMS (if indicated): done  Assets:  Communication Skills Desire for Improvement Resilience Social Support Talents/Skills  ADL's:  Intact  Cognition: WNL  Sleep:  Fair   Screenings: GAD-7    Flowsheet Row Video Visit from 08/22/2021 in [facility] Video Visit from 07/19/2021 in [facility] Video Visit from 06/13/2021 in [facility] Video Visit from 05/17/2021 in [facility]  Total GAD-7 Score 12 19 12 20       PHQ2-9    Flowsheet Row Video Visit from 08/22/2021 in [facility] Video Visit from 07/19/2021 in [facility] Video Visit from 06/13/2021 in [facility] Video Visit from 05/17/2021 in [facility] Video Visit from 05/03/2021 in [facility]  PHQ-2 Total Score 2 5 1 4 5   PHQ-9 Total Score 6 18 -- 13 12      Flowsheet Row Video Visit from 08/22/2021 in [facility] Video Visit from 07/19/2021 in [facility] Admission (Discharged) from 05/29/2021 in [facility] (1A)  C-SSRS RISK CATEGORY No Risk No Risk No Risk         Assessment and Plan: Jamie [name] is a 56 year old Caucasian female who has a history of MDD, chronic pain, insomnia was evaluated by telemedicine today.  Patient is currently struggling with job related stressors, pain which does have an impact on her anxiety, she will benefit from psychotherapy sessions, has been noncompliant.  Discussed plan as noted below.  Plan MDD-in remission Celexa 40 mg p.o. daily Rexulti 2 mg p.o. nightly  Insomnia-stable Elavil 10 to 20 mg p.o. nightly as needed   GAD-unstable Patient advised to restart psychotherapy sessions, has been noncompliant. Celexa 40 mg p.o. daily Gabapentin 800 mg p.o. twice daily. We will consider changing her medications if she continues to have anxiety in spite of having psychotherapy sessions on a regular basis.  This was discussed with patient and she agrees with plan.  High risk medication use-ending labs, hemoglobin A1c, prolactin.  Lab order was faxed to [facility] lab.  Noncompliance with treatment plan-patient has been noncompliant with labs as well as psychotherapy sessions.  Encouraged compliance.  Follow-up in clinic in 4 weeks or sooner if needed.  This note was generated in part or whole with voice recognition software. Voice recognition is usually quite accurate but there are transcription errors that can and very often do occur. I apologize for any typographical errors that were not detected and corrected.       Ursula Alert, MD 10/05/2021, 8:15 AM

## 2021-10-22 ENCOUNTER — Other Ambulatory Visit: Payer: Self-pay | Admitting: Psychiatry

## 2021-10-22 DIAGNOSIS — F331 Major depressive disorder, recurrent, moderate: Secondary | ICD-10-CM

## 2021-10-22 DIAGNOSIS — F411 Generalized anxiety disorder: Secondary | ICD-10-CM

## 2021-10-31 ENCOUNTER — Other Ambulatory Visit
Admission: RE | Admit: 2021-10-31 | Discharge: 2021-10-31 | Disposition: A | Discharge status: Home / Self Care | Payer: BC Managed Care – PPO | Attending: Psychiatry | Admitting: Psychiatry

## 2021-10-31 DIAGNOSIS — Z5181 Encounter for therapeutic drug level monitoring: Secondary | ICD-10-CM | POA: Diagnosis not present

## 2021-10-31 DIAGNOSIS — Z79899 Other long term (current) drug therapy: Secondary | ICD-10-CM | POA: Insufficient documentation

## 2021-10-31 LAB — HEMOGLOBIN A1C
Hgb A1c MFr Bld: 5.6 % (ref 4.8–5.6)
Mean Plasma Glucose: 114.02 mg/dL

## 2021-11-01 ENCOUNTER — Other Ambulatory Visit: Payer: Self-pay

## 2021-11-01 ENCOUNTER — Encounter: Payer: Self-pay | Admitting: Psychiatry

## 2021-11-01 ENCOUNTER — Telehealth (INDEPENDENT_AMBULATORY_CARE_PROVIDER_SITE_OTHER): Payer: BC Managed Care – PPO | Admitting: Psychiatry

## 2021-11-01 DIAGNOSIS — F3342 Major depressive disorder, recurrent, in full remission: Secondary | ICD-10-CM | POA: Diagnosis not present

## 2021-11-01 DIAGNOSIS — G4701 Insomnia due to medical condition: Secondary | ICD-10-CM | POA: Diagnosis not present

## 2021-11-01 DIAGNOSIS — F411 Generalized anxiety disorder: Secondary | ICD-10-CM | POA: Diagnosis not present

## 2021-11-01 LAB — PROLACTIN: Prolactin: 13.5 ng/mL (ref 4.8–23.3)

## 2021-11-01 MED ORDER — AMITRIPTYLINE HCL 50 MG PO TABS: 50.0000 mg | ORAL_TABLET | Freq: Every day | ORAL | 0 refills | Status: DC

## 2021-11-01 MED ORDER — CITALOPRAM HYDROBROMIDE 20 MG PO TABS: 20.0000 mg | ORAL_TABLET | Freq: Every day | ORAL | 0 refills | Status: DC

## 2021-11-01 NOTE — Progress Notes (Signed)
Virtual Visit via Video Note  I connected with Jamie [name] on 11/01/21 at  8:30 AM EDT by a video enabled telemedicine application and verified that I am speaking with the correct person using two identifiers.  Location Provider Location : [facility] Patient Location : Work  Participants: Patient , Provider    I discussed the limitations of evaluation and management by telemedicine and the availability of in person appointments. The patient expressed understanding and agreed to proceed.   I discussed the assessment and treatment plan with the patient. The patient was provided an opportunity to ask questions and all were answered. The patient agreed with the plan and demonstrated an understanding of the instructions.   The patient was advised to call back or seek an in-person evaluation if the symptoms worsen or if the condition fails to improve as anticipated.   [facility] MD OP Progress Note  11/01/2021 8:47 AM Jamie [name]  MRN:  [id number]  Chief Complaint:  Chief Complaint   Follow-up; Depression; Anxiety    HPI: Jamie [name] is a 55-year-old Caucasian female, married, lives in [place], has a history of MDD, insomnia, GAD, chronic pain was evaluated by telemedicine today.  Patient today reports she continues to struggle with pain, lower back pain with sciatica, recently completed her course of prednisone without much benefit.  She does have upcoming appointment with a specialist for management.  The pain does have an impact on her sleep.  She reports her sleep is restless, gets only around 4 to 6 hours.  She hence has difficulty functioning in the morning, has trouble getting started in the morning.  However once she is at work she feels better and is able to function okay.  Although she continues to struggle due to pain throughout the day.  Patient hence continues to have anxiety, worries a lot.  Patient denies any suicidality or homicidality or perceptual  disturbances.  Patient is compliant on medication, denies side effects.  Patient has upcoming appointment with therapist and reports she is motivated to start therapy again.  Reviewed and discussed most recent labs including hemoglobin A1c and prolactin with patient.  Patient denies any other concerns today.  Visit Diagnosis:    ICD-10-CM   1. MDD (major depressive disorder), recurrent, in full remission (HCC)  F33.42 citalopram (CELEXA) 20 MG tablet    amitriptyline (ELAVIL) 50 MG tablet    2. Insomnia due to medical condition  G47.01 amitriptyline (ELAVIL) 50 MG tablet   pain    3. GAD (generalized anxiety disorder)  F41.1 citalopram (CELEXA) 20 MG tablet    amitriptyline (ELAVIL) 50 MG tablet      Past Psychiatric History: Reviewed past psychiatric history from progress note on 03/08/2019.  Past Medical History:  Past Medical History:  Diagnosis Date   Anxiety    Brachial neuritis    Cervical cancer (HCC)    Chronic neck pain    Chronic pain not due to malignancy    Depressive disorder    followed by Dr. John Clapacs.   Disturbance, sleep    DVT (deep venous thrombosis) (HCC)    left arm   Elevated blood pressure reading without diagnosis of hypertension    Embolism and thrombosis of splenic artery    H/O thrombophlebitis    Infarction of spleen    Lumbosacral neuritis    Obesity, Class II, BMI 35-39.9, with comorbidity     Past Surgical History:  Procedure Laterality Date   CESAREAN SECTION  09/24/2010     IVC FILTER INSERTION N/A 05/24/2021   Procedure: IVC FILTER INSERTION;  Surgeon: Algernon Huxley, MD;  Location: [facility];  Service: Cardiovascular;  Laterality: N/A;   IVC FILTER REMOVAL N/A 07/18/2021   Procedure: IVC FILTER REMOVAL;  Surgeon: Algernon Huxley, MD;  Location: [facility];  Service: Cardiovascular;  Laterality: N/A;   TONSILLECTOMY AND ADENOIDECTOMY  09/24/2010   TOTAL HIP ARTHROPLASTY Right 05/29/2021   Procedure: TOTAL HIP  ARTHROPLASTY ANTERIOR APPROACH;  Surgeon: Hessie Knows, MD;  Location: [facility];  Service: Orthopedics;  Laterality: Right;   TUBAL LIGATION  09/24/2010    Family Psychiatric History: Reviewed family psychiatric history from progress note on 03/08/2019.  Family History:  Family History  Problem Relation Age of Onset   Anxiety disorder Mother    Depression Mother    Alcohol abuse Brother     Social History: Reviewed social history from progress note on 03/08/2019. Social History   Socioeconomic History   Marital status: Married    Spouse name: rodney   Number of children: 3   Years of education: Not on file   Highest education level: Some college, no degree  Occupational History    Comment: full time  Tobacco Use   Smoking status: Never   Smokeless tobacco: Never  Vaping Use   Vaping Use: Never used  Substance and Sexual Activity   Alcohol use: Yes    Alcohol/week: 0.0 standard drinks    Comment: social   Drug use: Not Currently    Types: Marijuana   Sexual activity: Yes    Partners: Male    Birth control/protection: Surgical    Comment: Tubal ligation   Other Topics Concern   Not on file  Social History Narrative   Not on file   Social Determinants of Health   Financial Resource Strain: Not on file  Food Insecurity: Not on file  Transportation Needs: Not on file  Physical Activity: Not on file  Stress: Not on file  Social Connections: Not on file    Allergies:  Allergies  Allergen Reactions   Hydrocodone-Acetaminophen Nausea Only   Neuromuscular Blocking Agents Other (See Comments)    Don't work    Metabolic Disorder Labs: Lab Results  Component Value Date   HGBA1C 5.6 10/31/2021   MPG 114.02 10/31/2021   Lab Results  Component Value Date   PROLACTIN 13.5 10/31/2021   No results found for: CHOL, TRIG, HDL, CHOLHDL, VLDL, LDLCALC No results found for: TSH  Therapeutic Level Labs: No results found for: LITHIUM No results found for:  VALPROATE No components found for:  CBMZ  Current Medications: Current Outpatient Medications  Medication Sig Dispense Refill   amitriptyline (ELAVIL) 50 MG tablet Take 1 tablet (50 mg total) by mouth at bedtime. 30 tablet 0   citalopram (CELEXA) 20 MG tablet Take 1 tablet (20 mg total) by mouth daily. Being tapered off 30 tablet 0   predniSONE (DELTASONE) 10 MG tablet 31m x 3 days, 322mx 3 days, 2031m 3 days, 29m53m3 days     apixaban (ELIQUIS) 2.5 MG TABS tablet Take 1 tablet (2.5 mg total) by mouth every 12 (twelve) hours. (Patient not taking: Reported on 07/18/2021) 60 tablet 3   BINAXNOW COVID-19 AG HOME TEST KIT Use as Directed on the Package     brexpiprazole (REXULTI) 2 MG TABS tablet Take 1 tablet (2 mg total) by mouth daily. 30 tablet 1   celecoxib (CELEBREX) 200 MG capsule Take  by mouth.     cyclobenzaprine (FLEXERIL) 5 MG tablet Take 1 tablet by mouth 3 (three) times daily as needed. (Patient not taking: Reported on 07/18/2021)     docusate sodium (COLACE) 100 MG capsule Take 1 capsule (100 mg total) by mouth 2 (two) times daily. (Patient not taking: Reported on 07/18/2021) 10 capsule 0   gabapentin (NEURONTIN) 800 MG tablet Take 1 tablet (800 mg total) by mouth 2 (two) times daily. 60 tablet 1   methocarbamol (ROBAXIN) 500 MG tablet Take 1 tablet (500 mg total) by mouth every 6 (six) hours as needed for muscle spasms. (Patient not taking: Reported on 07/18/2021) 30 tablet 0   oxyCODONE (OXY IR/ROXICODONE) 5 MG immediate release tablet Take 1-2 tablets (5-10 mg total) by mouth every 4 (four) hours as needed for moderate pain (pain score 4-6). (Patient not taking: Reported on 07/18/2021) 30 tablet 0   predniSONE (DELTASONE) 10 MG tablet Take by mouth.     propranolol (INDERAL) 10 MG tablet TAKE 1 TABLET BY MOUTH TWICE DAILY AS NEEDED FOR  BREAKTHROUGH  ANXIETY 60 tablet 0   traMADol (ULTRAM) 50 MG tablet Take 1 tablet (50 mg total) by mouth every 6 (six) hours as needed. (Patient not  taking: Reported on 07/18/2021) 30 tablet 0   No current facility-administered medications for this visit.     Musculoskeletal: Strength & Muscle Tone:  UTA Gait & Station: normal Patient leans: N/A  Psychiatric Specialty Exam: Review of Systems  Musculoskeletal:  Positive for back pain.  Psychiatric/Behavioral:  Positive for decreased concentration and sleep disturbance. The patient is nervous/anxious.   All other systems reviewed and are negative.  Last menstrual period 02/18/2018.There is no height or weight on file to calculate BMI.  General Appearance: Casual  Eye Contact:  Fair  Speech:  Clear and Coherent  Volume:  Normal  Mood:  Anxious  Affect:  Congruent  Thought Process:  Goal Directed and Descriptions of Associations: Intact  Orientation:  Full (Time, Place, and Person)  Thought Content: Logical   Suicidal Thoughts:  No  Homicidal Thoughts:  No  Memory:  Immediate;   Fair Recent;   Fair Remote;   Fair  Judgement:  Fair  Insight:  Fair  Psychomotor Activity:  Normal  Concentration:  Concentration: Fair and Attention Span: Fair  Recall:  Fair  Fund of Knowledge: Fair  Language: Fair  Akathisia:  No  Handed:  Right  AIMS (if indicated): done, 0  Assets:  Communication Skills Desire for Improvement Housing Social Support Talents/Skills Transportation  ADL's:  Intact  Cognition: WNL  Sleep:  Poor   Screenings: GAD-7    Flowsheet Row Video Visit from 08/22/2021 in [facility] Video Visit from 07/19/2021 in [facility] Video Visit from 06/13/2021 in [facility] Video Visit from 05/17/2021 in [facility]  Total GAD-7 Score 12 19 12 20      PHQ2-9    Flowsheet Row Video Visit from 08/22/2021 in [facility] Video Visit from 07/19/2021 in [facility] Video Visit from 06/13/2021 in [facility] Video Visit from 05/17/2021 in [facility] Video Visit from 05/03/2021 in [facility]  PHQ-2 Total Score 2 5 1 4 5  PHQ-9 Total Score 6 18 -- 13 12      Flowsheet Row Video Visit from 08/22/2021 in [facility]   Admission (Discharged) from 05/29/2021 in [facility] (1A)  C-SSRS RISK CATEGORY No Risk No Risk No Risk        Assessment and Plan: Jamie [name] is a 55 year old Caucasian female who has a history of MDD, chronic pain, insomnia was evaluated by telemedicine today.  Patient continues to struggle with anxiety, sleep, more so because of her pain, sciatica.  She will benefit from medication management, psychotherapy sessions as well as management of her pain.  Plan MDD in remission Taper off Celexa.  Reduce Celexa to 20 mg p.o. daily. Rexulti 2 mg p.o. nightly   Insomnia-unstable Increase Elavil to 50 mg p.o. nightly. Patient will be sufficient pain management also since sleep problems are due to pain.  GAD-unstable Patient encouraged to restart psychotherapy sessions-has her first appointment coming up . Increase Elavil to 50 mg p.o. nightly.  High risk medication use-reviewed and discussed labs-prolactin-10/31/2021-within normal limits, hemoglobin A1c-within normal limits.  Follow-up in clinic in 3 weeks or sooner in person.  This note was generated in part or whole with voice recognition software. Voice recognition is usually quite accurate but there are transcription errors that can and very often do occur. I apologize for any typographical errors that were not detected and corrected.      Ursula Alert, MD 11/02/2021, 8:18 AM

## 2021-11-21 ENCOUNTER — Ambulatory Visit: Payer: BC Managed Care – PPO | Admitting: Psychiatry

## 2021-11-25 ENCOUNTER — Other Ambulatory Visit: Payer: Self-pay | Admitting: Psychiatry

## 2021-11-25 DIAGNOSIS — F411 Generalized anxiety disorder: Secondary | ICD-10-CM

## 2021-11-25 DIAGNOSIS — F331 Major depressive disorder, recurrent, moderate: Secondary | ICD-10-CM

## 2021-12-03 ENCOUNTER — Ambulatory Visit (INDEPENDENT_AMBULATORY_CARE_PROVIDER_SITE_OTHER): Payer: BC Managed Care – PPO | Admitting: Licensed Clinical Social Worker

## 2021-12-03 ENCOUNTER — Other Ambulatory Visit: Payer: Self-pay

## 2021-12-03 DIAGNOSIS — F411 Generalized anxiety disorder: Secondary | ICD-10-CM | POA: Diagnosis not present

## 2021-12-03 DIAGNOSIS — F3342 Major depressive disorder, recurrent, in full remission: Secondary | ICD-10-CM | POA: Diagnosis not present

## 2021-12-03 NOTE — Plan of Care (Signed)
Developed tx plan w/ pt input  Problem: Depression CCP Problem  1 Decrease depressive symptoms and improve levels of effective functioning  Goal: LTG: Reduce frequency, intensity, and duration of depression symptoms as evidenced by: Pt self report Outcome: Progressing Goal: STG: [name] WILL PARTICIPATE IN AT LEAST 80% OF SCHEDULED INDIVIDUAL PSYCHOTHERAPY SESSIONS Outcome: Progressing Intervention: WORK WITH Abigail Butts TO IDENTIFY THE MAJOR COMPONENTS OF A RECENT EPISODE OF DEPRESSION: PHYSICAL SYMPTOMS, MAJOR THOUGHTS AND IMAGES, AND MAJOR BEHAVIORS THEY EXPERIENCED Intervention: REVIEW PLEASE SKILLS (TREAT PHYSICAL ILLNESS, BALANCE EATING, AVOID MOOD-ALTERING SUBSTANCES, BALANCE SLEEP AND GET EXERCISE) WITH Abigail Butts Intervention: Assist patient to identify own strengths and abilities
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
## 2021-12-04 ENCOUNTER — Other Ambulatory Visit: Payer: Self-pay | Admitting: Psychiatry

## 2021-12-04 DIAGNOSIS — F411 Generalized anxiety disorder: Secondary | ICD-10-CM

## 2021-12-04 DIAGNOSIS — F3342 Major depressive disorder, recurrent, in full remission: Secondary | ICD-10-CM

## 2021-12-09 ENCOUNTER — Other Ambulatory Visit: Payer: Self-pay | Admitting: Psychiatry

## 2021-12-09 DIAGNOSIS — F411 Generalized anxiety disorder: Secondary | ICD-10-CM

## 2021-12-09 DIAGNOSIS — G4701 Insomnia due to medical condition: Secondary | ICD-10-CM

## 2021-12-09 DIAGNOSIS — F3342 Major depressive disorder, recurrent, in full remission: Secondary | ICD-10-CM

## 2021-12-11 ENCOUNTER — Ambulatory Visit: Payer: BC Managed Care – PPO | Admitting: Psychiatry

## 2021-12-25 ENCOUNTER — Other Ambulatory Visit: Payer: Self-pay | Admitting: Psychiatry

## 2021-12-25 DIAGNOSIS — F411 Generalized anxiety disorder: Secondary | ICD-10-CM

## 2021-12-25 DIAGNOSIS — F331 Major depressive disorder, recurrent, moderate: Secondary | ICD-10-CM

## 2022-01-08 ENCOUNTER — Other Ambulatory Visit: Payer: Self-pay | Admitting: Psychiatry

## 2022-01-08 DIAGNOSIS — F411 Generalized anxiety disorder: Secondary | ICD-10-CM

## 2022-01-08 DIAGNOSIS — G4701 Insomnia due to medical condition: Secondary | ICD-10-CM

## 2022-01-08 DIAGNOSIS — F3342 Major depressive disorder, recurrent, in full remission: Secondary | ICD-10-CM

## 2022-01-14 ENCOUNTER — Ambulatory Visit (INDEPENDENT_AMBULATORY_CARE_PROVIDER_SITE_OTHER): Payer: BC Managed Care – PPO | Admitting: Licensed Clinical Social Worker

## 2022-01-14 ENCOUNTER — Other Ambulatory Visit: Payer: Self-pay

## 2022-01-14 DIAGNOSIS — F3342 Major depressive disorder, recurrent, in full remission: Secondary | ICD-10-CM

## 2022-01-14 DIAGNOSIS — F411 Generalized anxiety disorder: Secondary | ICD-10-CM | POA: Diagnosis not present

## 2022-01-14 NOTE — Progress Notes (Signed)
Virtual Visit via Video Note  I connected with Jamie [name] on 01/14/22 at  8:00 AM EST by a video enabled telemedicine application and verified that I am speaking with the correct person using two identifiers.  Location: Patient: home Provider: remote office [place], [place])   I discussed the limitations of evaluation and management by telemedicine and the availability of in person appointments. The patient expressed understanding and agreed to proceed.  I discussed the assessment and treatment plan with the patient. The patient was provided an opportunity to ask questions and all were answered. The patient agreed with the plan and demonstrated an understanding of the instructions.   The patient was advised to call back or seek an in-person evaluation if the symptoms worsen or if the condition fails to improve as anticipated.  I provided 30 minutes of non-face-to-face time during this encounter.   [place], LCSW   THERAPIST PROGRESS NOTE  Session Time: 8-830a  Participation Level: Active  Behavioral Response: Neat and Well GroomedAlertAnxious and Depressed  Type of Therapy: Individual Therapy  Treatment Goals addressed:  Problem: Depression CCP Problem  1 Decrease depressive symptoms and improve levels of effective functioning   Goal: LTG: Reduce frequency, intensity, and duration of depression symptoms as evidenced by: Pt self report Outcome: Progressing  Goal: STG: Jamie [name] WILL PARTICIPATE IN AT LEAST 80% OF SCHEDULED INDIVIDUAL PSYCHOTHERAPY SESSIONS Outcome: Progressing  Interventions:  Intervention: Discuss self-management skills Intervention: Assist with relaxation techniques, as appropriate (deep breathing exercises, meditation, guided imagery) Intervention: Assist with coping skills and behavior   Summary: Jamie [name] is a 56 y.o. female who presents with improving symptoms related to depression diagnosis. Pt reports overall mood is stable and that  she is managing situational stress/anxiety triggers well. Pt reporting continuing job stress. Pt reports that she is continuing to seek employment elsewhere. Explored relationships with family and extended family members. Pt reports that all relationships good with no identifiable conflict.  Pt worries about young son in middle school--pt feels that he is being harassed by other students and wants to be homeschooled now.Encouraged pt to reach out to the school to inform them of what is going on.   Pt is continuing to struggle physically with hip pain and physical demands of job.   Continued recommendations are as follows: self care behaviors, positive social engagements, focusing on overall work/home/life balance, and focusing on positive physical and emotional wellness.  .   Suicidal/Homicidal: No  Therapist Response: Pt is continuing to apply interventions learned in session into daily life situations. Pt is currently on track to meet goals utilizing interventions mentioned above. Personal growth and progress noted. Treatment to continue as indicated.   Plan: Return again in 4 weeks.  Diagnosis: Axis I: MDD, recurrent    Axis II: No diagnosis  Jamie Bo Enrica Corliss, LCSW 01/14/2022

## 2022-01-14 NOTE — Plan of Care (Signed)
°  Problem: Depression CCP Problem  1 Decrease depressive symptoms and improve levels of effective functioning  Goal: LTG: Reduce frequency, intensity, and duration of depression symptoms as evidenced by: Pt self report Outcome: Progressing Goal: STG: Jamie [name] WILL PARTICIPATE IN AT LEAST 80% OF SCHEDULED INDIVIDUAL PSYCHOTHERAPY SESSIONS Outcome: Progressing Intervention: Discuss self-management skills Intervention: Assist with relaxation techniques, as appropriate (deep breathing exercises, meditation, guided imagery) Intervention: Assist with coping skills and behavior

## 2022-01-18 ENCOUNTER — Other Ambulatory Visit: Payer: Self-pay | Admitting: Psychiatry

## 2022-01-18 DIAGNOSIS — F331 Major depressive disorder, recurrent, moderate: Secondary | ICD-10-CM

## 2022-01-18 DIAGNOSIS — F411 Generalized anxiety disorder: Secondary | ICD-10-CM

## 2022-01-28 ENCOUNTER — Encounter: Payer: Self-pay | Admitting: Psychiatry

## 2022-01-28 ENCOUNTER — Telehealth (INDEPENDENT_AMBULATORY_CARE_PROVIDER_SITE_OTHER): Payer: BC Managed Care – PPO | Admitting: Psychiatry

## 2022-01-28 ENCOUNTER — Other Ambulatory Visit: Payer: Self-pay

## 2022-01-28 DIAGNOSIS — G4701 Insomnia due to medical condition: Secondary | ICD-10-CM

## 2022-01-28 DIAGNOSIS — F3342 Major depressive disorder, recurrent, in full remission: Secondary | ICD-10-CM

## 2022-01-28 DIAGNOSIS — F411 Generalized anxiety disorder: Secondary | ICD-10-CM | POA: Diagnosis not present

## 2022-01-28 MED ORDER — CITALOPRAM HYDROBROMIDE 10 MG PO TABS: 10.0000 mg | ORAL_TABLET | Freq: Every day | ORAL | 0 refills | Status: DC

## 2022-01-28 MED ORDER — BREXPIPRAZOLE 2 MG PO TABS: 2.0000 mg | ORAL_TABLET | Freq: Every day | ORAL | 1 refills | Status: DC

## 2022-01-28 MED ORDER — AMITRIPTYLINE HCL 50 MG PO TABS: 50.0000 mg | ORAL_TABLET | Freq: Every day | ORAL | 1 refills | Status: DC

## 2022-01-28 MED ORDER — PROPRANOLOL HCL 10 MG PO TABS: 10.0000 mg | ORAL_TABLET | Freq: Two times a day (BID) | ORAL | 1 refills | Status: DC | PRN

## 2022-01-28 NOTE — Progress Notes (Signed)
Virtual Visit via Video Note  I connected with Jamie [name] on 01/28/22 at  4:40 PM EST by a video enabled telemedicine application and verified that I am speaking with the correct person using two identifiers.  Location Provider Location : [facility] Patient Location : Home  Participants: Patient , Provider    I discussed the limitations of evaluation and management by telemedicine and the availability of in person appointments. The patient expressed understanding and agreed to proceed.   I discussed the assessment and treatment plan with the patient. The patient was provided an opportunity to ask questions and all were answered. The patient agreed with the plan and demonstrated an understanding of the instructions.   The patient was advised to call back or seek an in-person evaluation if the symptoms worsen or if the condition fails to improve as anticipated.   [facility] MD OP Progress Note  01/28/2022 5:13 PM MARLYNN HINCKLEY  MRN:  [id number]  Chief Complaint:  Chief Complaint   Follow-up 56 year old Caucasian female with history of MDD, GAD, insomnia presents for  medication management.    HPI: Jamie [name] is a 56 year old Caucasian female, married, lives in [place], has a history of MDD, insomnia, GAD, chronic pain was evaluated by telemedicine today.  Patient today reports she is currently doing fairly well on the current medication regimen.  Denies any significant sadness.  She does have anxiety about her upcoming new job.  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  She reports she is going to work third shift.  She looks forward to that.  The current job is too physical and it is hard for her due to her back pain.  Patient reports she is currently taking lower dosage of Celexa in combination with her Elavil and Rexulti.  Denies any side effects.  Patient was advised to return in 3 weeks after her last appointment in November however reports she could  not keep the appointment due to scheduling problems.  Patient denies any suicidality, homicidality or perceptual disturbances.  Continues to follow-up with her therapist.  Patient denies any other concerns today.  Visit Diagnosis:    ICD-10-CM   1. MDD (major depressive disorder), recurrent, in full remission ([place])  F33.42 brexpiprazole (REXULTI) 2 MG TABS tablet    amitriptyline (ELAVIL) 50 MG tablet    2. Insomnia due to medical condition  G47.01 amitriptyline (ELAVIL) 50 MG tablet   pain    3. GAD (generalized anxiety disorder)  F41.1 propranolol (INDERAL) 10 MG tablet    amitriptyline (ELAVIL) 50 MG tablet      Past Psychiatric History: Reviewed past psychiatric history from progress note on 03/08/2019.  Past Medical History:  Past Medical History:  Diagnosis Date   Anxiety    Brachial neuritis    Cervical cancer (HCC)    Chronic neck pain    Chronic pain not due to malignancy    Depressive disorder    followed by Dr. Alethia Berthold.   Disturbance, sleep    DVT (deep venous thrombosis) (HCC)    left arm   Elevated blood pressure reading without diagnosis of hypertension    Embolism and thrombosis of splenic artery    H/O thrombophlebitis    Infarction of spleen    Lumbosacral neuritis    Obesity, Class II, BMI 35-39.9, with comorbidity     Past Surgical History:  Procedure Laterality Date   CESAREAN SECTION  09/24/2010   IVC FILTER  INSERTION N/A 05/24/2021   Procedure: IVC FILTER INSERTION;  Surgeon: Algernon Huxley, MD;  Location: [facility];  Service: Cardiovascular;  Laterality: N/A;   IVC FILTER REMOVAL N/A 07/18/2021   Procedure: IVC FILTER REMOVAL;  Surgeon: Algernon Huxley, MD;  Location: [facility];  Service: Cardiovascular;  Laterality: N/A;   TONSILLECTOMY AND ADENOIDECTOMY  09/24/2010   TOTAL HIP ARTHROPLASTY Right 05/29/2021   Procedure: TOTAL HIP ARTHROPLASTY ANTERIOR APPROACH;  Surgeon: Hessie Knows, MD;  Location: [facility];  Service:  Orthopedics;  Laterality: Right;   TUBAL LIGATION  09/24/2010    Family Psychiatric History: Reviewed family psychiatric history from progress note on 03/08/2019.  Family History:  Family History  Problem Relation Age of Onset   Anxiety disorder Mother    Depression Mother    Alcohol abuse Brother     Social History: Reviewed social history from progress note on 03/08/2019. Social History   Socioeconomic History   Marital status: Married    Spouse name: rodney   Number of children: 3   Years of education: Not on file   Highest education level: Some college, no degree  Occupational History    Comment: full time  Tobacco Use   Smoking status: Never   Smokeless tobacco: Never  Vaping Use   Vaping Use: Never used  Substance and Sexual Activity   Alcohol use: Yes    Alcohol/week: 0.0 standard drinks    Comment: social   Drug use: Not Currently    Types: Marijuana   Sexual activity: Yes    Partners: Male    Birth control/protection: Surgical    Comment: Tubal ligation   Other Topics Concern   Not on file  Social History Narrative   Not on file   Social Determinants of Health   Financial Resource Strain: Not on file  Food Insecurity: Not on file  Transportation Needs: Not on file  Physical Activity: Not on file  Stress: Not on file  Social Connections: Not on file    Allergies:  Allergies  Allergen Reactions   Hydrocodone-Acetaminophen Nausea Only   Neuromuscular Blocking Agents Other (See Comments)    Don't work    Metabolic Disorder Labs: Lab Results  Component Value Date   HGBA1C 5.6 10/31/2021   MPG 114.02 10/31/2021   Lab Results  Component Value Date   PROLACTIN 13.5 10/31/2021   No results found for: CHOL, TRIG, HDL, CHOLHDL, VLDL, LDLCALC No results found for: TSH  Therapeutic Level Labs: No results found for: LITHIUM No results found for: VALPROATE No components found for:  CBMZ  Current Medications: Current Outpatient Medications   Medication Sig Dispense Refill   citalopram (CELEXA) 10 MG tablet Take 1 tablet (10 mg total) by mouth daily. Stop after 3 days 4 tablet 0   amitriptyline (ELAVIL) 50 MG tablet Take 1 tablet (50 mg total) by mouth at bedtime. 30 tablet 1   apixaban (ELIQUIS) 2.5 MG TABS tablet Take 1 tablet (2.5 mg total) by mouth every 12 (twelve) hours. (Patient not taking: Reported on 07/18/2021) 60 tablet 3   BINAXNOW COVID-19 AG HOME TEST KIT Use as Directed on the Package     brexpiprazole (REXULTI) 2 MG TABS tablet Take 1 tablet (2 mg total) by mouth daily. 30 tablet 1   celecoxib (CELEBREX) 200 MG capsule Take by mouth.     cyclobenzaprine (FLEXERIL) 5 MG tablet Take 1 tablet by mouth 3 (three) times daily as needed. (Patient not taking: Reported  on 07/18/2021)     docusate sodium (COLACE) 100 MG capsule Take 1 capsule (100 mg total) by mouth 2 (two) times daily. (Patient not taking: Reported on 07/18/2021) 10 capsule 0   gabapentin (NEURONTIN) 800 MG tablet Take 1 tablet by mouth twice daily 15 tablet 0   methocarbamol (ROBAXIN) 500 MG tablet Take 1 tablet (500 mg total) by mouth every 6 (six) hours as needed for muscle spasms. (Patient not taking: Reported on 07/18/2021) 30 tablet 0   oxyCODONE (OXY IR/ROXICODONE) 5 MG immediate release tablet Take 1-2 tablets (5-10 mg total) by mouth every 4 (four) hours as needed for moderate pain (pain score 4-6). (Patient not taking: Reported on 07/18/2021) 30 tablet 0   predniSONE (DELTASONE) 10 MG tablet 77m x 3 days, 320mx 3 days, 2063m 3 days, 38m67m3 days     predniSONE (DELTASONE) 10 MG tablet Take by mouth.     propranolol (INDERAL) 10 MG tablet Take 1 tablet (10 mg total) by mouth 2 (two) times daily as needed. For severe anxiety 60 tablet 1   traMADol (ULTRAM) 50 MG tablet Take 1 tablet (50 mg total) by mouth every 6 (six) hours as needed. (Patient not taking: Reported on 07/18/2021) 30 tablet 0   No current facility-administered medications for this visit.      Musculoskeletal: Strength & Muscle Tone:  UTA Gait & Station:  Seated Patient leans: N/A  Psychiatric Specialty Exam: Review of Systems  Musculoskeletal:  Positive for back pain.  Psychiatric/Behavioral:  The patient is nervous/anxious.   All other systems reviewed and are negative.  Last menstrual period 02/18/2018.There is no height or weight on file to calculate BMI.  General Appearance: Casual  Eye Contact:  Fair  Speech:  Normal Rate  Volume:  Normal  Mood:  Anxious  Affect:  Congruent  Thought Process:  Goal Directed and Descriptions of Associations: Intact  Orientation:  Full (Time, Place, and Person)  Thought Content: Logical   Suicidal Thoughts:  No  Homicidal Thoughts:  No  Memory:  Immediate;   Fair Recent;   Fair Remote;   Fair  Judgement:  Fair  Insight:  Fair  Psychomotor Activity:  Normal  Concentration:  Concentration: Fair and Attention Span: Fair  Recall:  FairAES CorporationKnowledge: Fair  Language: Fair  Akathisia:  No  Handed:  Right  AIMS (if indicated): done, 0  Assets:  Communication Skills Desire for ImprSterlingents/Skills Transportation  ADL's:  Intact  Cognition: WNL  Sleep:  Fair   Screenings: GAD-7    Flowsheet Row Video Visit from 01/28/2022 in AlamBurwelle[facility] Visit from 08/22/2021 in AlamGold Bareo Visit from 07/19/2021 in AlamLoganeo Visit from 06/13/2021 in AlamHeatheo Visit from 05/17/2021 in AlamRoanoketal GAD-7 Score _0 PHQ2-9    Flowsheet Row Video Visit from 01/28/2022 in AlamWelchm 12/03/2021 in AlamWestporteo Visit from 08/22/2021 in AlamLansfordeo Visit from 07/19/2021 in AlamMertenseo Visit from 06/13/2021 in AlamSt. Clair ShoresQ-2 Total Score 0 _1 PHQ-9 Total Score -- _2 --      Flowsheet Row Video Visit from 01/28/2022 in AlamHolualoanselor from 12/03/2021 in AlamMaryhilleo  Visit from 08/22/2021 in [place] No Risk No Risk No Risk        Assessment and Plan: Jamie [name] is a 56 year old Caucasian female who has a history of MDD, chronic pain, insomnia was evaluated by telemedicine today.  Patient is currently improving, will benefit from the following medication changes.  Plan as noted below.  Plan MDD in remission Continue to taper off Celexa as discussed last visit. We will reduce Celexa to 10 mg p.o. daily for the next 3 to 4 days and stop taking it Rexulti 2 mg p.o. nightly Continue Elavil 50 mg p.o. nightly  Insomnia-improving Elavil 50 mg p.o. nightly   GAD-improving Elavil 50 mg p.o. nightly Continue CBT with Ms. Christina Hussami  Follow-up in clinic in 3 weeks or sooner if needed.  This note was generated in part or whole with voice recognition software. Voice recognition is usually quite accurate but there are transcription errors that can and very often do occur. I apologize for any typographical errors that were not detected and corrected.      Ursula Alert, MD 01/29/2022, 8:20 AM

## 2022-01-31 ENCOUNTER — Other Ambulatory Visit: Payer: Self-pay | Admitting: Psychiatry

## 2022-01-31 DIAGNOSIS — F411 Generalized anxiety disorder: Secondary | ICD-10-CM

## 2022-02-21 ENCOUNTER — Telehealth: Payer: Self-pay

## 2022-02-21 NOTE — Telephone Encounter (Signed)
went online to [email or web address] and prior auth was uploaded.  prior auth was submitted for the rexulti and is pending

## 2022-02-21 NOTE — Telephone Encounter (Signed)
received confirmation that prior auth was approved from [id number] to  02-20-23.

## 2022-02-26 ENCOUNTER — Other Ambulatory Visit: Payer: Self-pay

## 2022-02-26 ENCOUNTER — Telehealth (INDEPENDENT_AMBULATORY_CARE_PROVIDER_SITE_OTHER): Payer: BC Managed Care – PPO | Admitting: Psychiatry

## 2022-02-26 ENCOUNTER — Encounter: Payer: Self-pay | Admitting: Psychiatry

## 2022-02-26 DIAGNOSIS — F3342 Major depressive disorder, recurrent, in full remission: Secondary | ICD-10-CM | POA: Diagnosis not present

## 2022-02-26 DIAGNOSIS — F411 Generalized anxiety disorder: Secondary | ICD-10-CM | POA: Diagnosis not present

## 2022-02-26 DIAGNOSIS — G4701 Insomnia due to medical condition: Secondary | ICD-10-CM | POA: Diagnosis not present

## 2022-02-26 MED ORDER — AMITRIPTYLINE HCL 75 MG PO TABS: 75.0000 mg | ORAL_TABLET | Freq: Every day | ORAL | 1 refills | Status: DC

## 2022-02-26 NOTE — Progress Notes (Signed)
Virtual Visit via Video Note  I connected with Jamie [name] on 02/26/22 at  8:30 AM EST by a video enabled telemedicine application and verified that I am speaking with the correct person using two identifiers.  Location Provider Location : [facility] Patient Location : Home  Participants: Patient , Provider    I discussed the limitations of evaluation and management by telemedicine and the availability of in person appointments. The patient expressed understanding and agreed to proceed.   I discussed the assessment and treatment plan with the patient. The patient was provided an opportunity to ask questions and all were answered. The patient agreed with the plan and demonstrated an understanding of the instructions.   The patient was advised to call back or seek an in-person evaluation if the symptoms worsen or if the condition fails to improve as anticipated.   Jamie [name] Progress Note  02/26/2022 12:57 PM KERI VEALE  MRN:  [id number]  Chief Complaint:  Chief Complaint  Patient presents with   Follow-up: 56 year old Caucasian female with history of MDD, GAD, insomnia was evaluated for medication management.   HPI: Jamie [name] is a 56 year old Caucasian female, married, lives in [place], has a history of MDD, insomnia, GAD, chronic pain was evaluated by telemedicine today.  Patient today reports she is currently struggling with work-related stressors.  She reports she is having a hard time getting used to her work schedule.  She reports it also involves heavy physical labor and that has been hard on her as well.  Patient reports when she is home on her days off she feels so tired that she is unable to take care of her home and take care of household chores.  This does affect her emotionally, does make her anxious.  Patient currently denies any significant sadness or anhedonia.  She however does struggle with lack of motivation as noted above and has to push herself to do  things, likely due to being tired.  Continues to have chronic pain although it is currently better on the current medication regimen.  Reports sleep is improving.  Gets around 4 to 5 hours of sleep at night.  Currently takes amitriptyline at night which helps.  Patient denies any suicidality, homicidality or perceptual disturbances.  Patient was able to taper herself off the Celexa.  Denies any withdrawal symptoms.  Currently compliant on Rexulti, denies side effects.  Continues to be in psychotherapy sessions, motivated to stay in therapy.  Patient denies any other concerns today.  Visit Diagnosis:    ICD-10-CM   1. MDD (major depressive disorder), recurrent, in full remission ([place])  F33.42 amitriptyline (ELAVIL) 75 MG tablet    2. Insomnia due to medical condition  G47.01 amitriptyline (ELAVIL) 75 MG tablet   pain    3. GAD (generalized anxiety disorder)  F41.1 amitriptyline (ELAVIL) 75 MG tablet      Past Psychiatric History: Reviewed past psychiatric history from progress note on 03/08/2019.  Past Medical History:  Past Medical History:  Diagnosis Date   Anxiety    Brachial neuritis    Cervical cancer (HCC)    Chronic neck pain    Chronic pain not due to malignancy    Depressive disorder    followed by Dr. Alethia Berthold.   Disturbance, sleep    DVT (deep venous thrombosis) (HCC)    left arm   Elevated blood pressure reading without diagnosis of hypertension    Embolism and thrombosis of splenic artery  H/O thrombophlebitis    Infarction of spleen    Lumbosacral neuritis    Obesity, Class II, BMI 35-39.9, with comorbidity     Past Surgical History:  Procedure Laterality Date   CESAREAN SECTION  09/24/2010   IVC FILTER INSERTION N/A 05/24/2021   Procedure: IVC FILTER INSERTION;  Surgeon: Algernon Huxley, MD;  Location: [facility];  Service: Cardiovascular;  Laterality: N/A;   IVC FILTER REMOVAL N/A 07/18/2021   Procedure: IVC FILTER REMOVAL;  Surgeon: Algernon Huxley, MD;  Location: [facility];  Service: Cardiovascular;  Laterality: N/A;   TONSILLECTOMY AND ADENOIDECTOMY  09/24/2010   TOTAL HIP ARTHROPLASTY Right 05/29/2021   Procedure: TOTAL HIP ARTHROPLASTY ANTERIOR APPROACH;  Surgeon: Hessie Knows, MD;  Location: [facility];  Service: Orthopedics;  Laterality: Right;   TUBAL LIGATION  09/24/2010    Family Psychiatric History: Reviewed family psychiatric history from progress note on 03/08/2019.  Family History:  Family History  Problem Relation Age of Onset   Anxiety disorder Mother    Depression Mother    Alcohol abuse Brother     Social History: Reviewed social history from progress note on 03/08/2019. Social History   Socioeconomic History   Marital status: Married    Spouse name: rodney   Number of children: 3   Years of education: Not on file   Highest education level: Some college, no degree  Occupational History    Comment: full time  Tobacco Use   Smoking status: Never   Smokeless tobacco: Never  Vaping Use   Vaping Use: Never used  Substance and Sexual Activity   Alcohol use: Yes    Alcohol/week: 0.0 standard drinks    Comment: social   Drug use: Not Currently    Types: Marijuana   Sexual activity: Yes    Partners: Male    Birth control/protection: Surgical    Comment: Tubal ligation   Other Topics Concern   Not on file  Social History Narrative   Not on file   Social Determinants of Health   Financial Resource Strain: Not on file  Food Insecurity: Not on file  Transportation Needs: Not on file  Physical Activity: Not on file  Stress: Not on file  Social Connections: Not on file    Allergies:  Allergies  Allergen Reactions   Hydrocodone-Acetaminophen Nausea Only   Neuromuscular Blocking Agents Other (See Comments)    Don't work    Metabolic Disorder Labs: Lab Results  Component Value Date   HGBA1C 5.6 10/31/2021   MPG 114.02 10/31/2021   Lab Results  Component Value Date   PROLACTIN  13.5 10/31/2021   No results found for: CHOL, TRIG, HDL, CHOLHDL, VLDL, LDLCALC No results found for: TSH  Therapeutic Level Labs: No results found for: LITHIUM No results found for: VALPROATE No components found for:  CBMZ  Current Medications: Current Outpatient Medications  Medication Sig Dispense Refill   amitriptyline (ELAVIL) 75 MG tablet Take 1 tablet (75 mg total) by mouth at bedtime. 30 tablet 1   apixaban (ELIQUIS) 2.5 MG TABS tablet Take 1 tablet (2.5 mg total) by mouth every 12 (twelve) hours. (Patient not taking: Reported on 07/18/2021) 60 tablet 3   BINAXNOW COVID-19 AG HOME TEST KIT Use as Directed on the Package     brexpiprazole (REXULTI) 2 MG TABS tablet Take 1 tablet (2 mg total) by mouth daily. 30 tablet 1   celecoxib (CELEBREX) 200 MG capsule Take by mouth.  cyclobenzaprine (FLEXERIL) 5 MG tablet Take 1 tablet by mouth 3 (three) times daily as needed. (Patient not taking: Reported on 07/18/2021)     docusate sodium (COLACE) 100 MG capsule Take 1 capsule (100 mg total) by mouth 2 (two) times daily. (Patient not taking: Reported on 07/18/2021) 10 capsule 0   gabapentin (NEURONTIN) 800 MG tablet Take 1 tablet by mouth twice daily 60 tablet 2   methocarbamol (ROBAXIN) 500 MG tablet Take 1 tablet (500 mg total) by mouth every 6 (six) hours as needed for muscle spasms. (Patient not taking: Reported on 07/18/2021) 30 tablet 0   oxyCODONE (OXY IR/ROXICODONE) 5 MG immediate release tablet Take 1-2 tablets (5-10 mg total) by mouth every 4 (four) hours as needed for moderate pain (pain score 4-6). (Patient not taking: Reported on 07/18/2021) 30 tablet 0   predniSONE (DELTASONE) 10 MG tablet 31m x 3 days, 3109mx 3 days, 2064m 3 days, 64m46m3 days     predniSONE (DELTASONE) 10 MG tablet Take by mouth.     propranolol (INDERAL) 10 MG tablet Take 1 tablet (10 mg total) by mouth 2 (two) times daily as needed. For severe anxiety 60 tablet 1   traMADol (ULTRAM) 50 MG tablet Take 1  tablet (50 mg total) by mouth every 6 (six) hours as needed. (Patient not taking: Reported on 07/18/2021) 30 tablet 0   No current facility-administered medications for this visit.     Musculoskeletal: Strength & Muscle Tone:  UTA Gait & Station:  Seated Patient leans: N/A  Psychiatric Specialty Exam: Review of Systems  Constitutional:  Positive for fatigue.  Musculoskeletal:  Positive for back pain.  Psychiatric/Behavioral:  Positive for sleep disturbance. The patient is nervous/anxious.   All other systems reviewed and are negative.  Last menstrual period 02/18/2018.There is no height or weight on file to calculate BMI.  General Appearance: Casual  Eye Contact:  Fair  Speech:  Clear and Coherent  Volume:  Normal  Mood:  Anxious  Affect:  Congruent  Thought Process:  Goal Directed and Descriptions of Associations: Intact  Orientation:  Full (Time, Place, and Person)  Thought Content: Logical   Suicidal Thoughts:  No  Homicidal Thoughts:  No  Memory:  Immediate;   Fair Recent;   Fair Remote;   Fair  Judgement:  Fair  Insight:  Fair  Psychomotor Activity:  Normal  Concentration:  Concentration: Fair and Attention Span: Fair  Recall:  FairAES CorporationKnowledge: Fair  Language: Fair  Akathisia:  No  Handed:  Right  AIMS (if indicated): done, 0  Assets:  Communication Skills Desire for Improvement Housing Intimacy Social Support  ADL's:  Intact  Cognition: WNL  Sleep:   restless   Screenings: AIMS    Flowsheet Row Video Visit from 02/26/2022 in AlamChristiansburgal Score 0      GAD-7    Flowsheet Row Video Visit from 02/26/2022 in AlamOttawaeo Visit from 01/28/2022 in AlamRedlandseo Visit from 08/22/2021 in AlamBrandywineeo Visit from 07/19/2021 in AlamLone Oakeo Visit from 06/13/2021 in AlamCampbelltal GAD-7 Score 6 1 12 19 12       PHQ2-9    FlowSilver Springeo Visit from 02/26/2022 in AlamRedings Milleo Visit from 01/28/2022 in AlamPennm 12/03/2021 in AlamDogtowneo Visit from 08/22/2021 in [facility]amPowhatan  [facility] Video Visit from 07/19/2021 in [place]  PHQ-2 Total Score 2 0 2 2 5   PHQ-9 Total Score 6 -- 7 6 18       Flowsheet Row Video Visit from 02/26/2022 in [place] Video Visit from 01/28/2022 in [place] Counselor from 12/03/2021 in [place] No Risk No Risk No Risk        Assessment and Plan: TAFFY DELCONTE is a 56 year old Caucasian female who has a history of MDD, chronic pain, insomnia was evaluated by telemedicine today.  Patient although improving continues to have mood symptoms, as noted above and will benefit from psychotherapy as well as medication management.  Plan as noted below.  Plan MDD in remission Rexulti 2 mg p.o. nightly Elavil as prescribed Continue CBT  GAD-unstable Increase Elavil to 75 mg p.o. nightly Continue CBT  Insomnia-improving Continued sleep hygiene techniques as well as need sufficient pain management Continue Elavil at higher dosage as noted above  Follow-up in clinic in 4 weeks or sooner if needed.   Collaboration of Care: Collaboration of Care: Referral or follow-up with counselor/therapist AEB encouraged to follow up with therapist at our practice, has upcoming appointment.  Patient/Guardian was advised Release of Information must be obtained prior to any record release in order to collaborate their care with an outside provider. Patient/Guardian was advised if they have not already done so to contact the registration department to sign all  necessary forms in order for [place] to release information regarding their care.   Consent: Patient/Guardian gives verbal consent for treatment and assignment of benefits for services provided during this visit. Patient/Guardian expressed understanding and agreed to proceed.   This note was generated in part or whole with voice recognition software. Voice recognition is usually quite accurate but there are transcription errors that can and very often do occur. I apologize for any typographical errors that were not detected and corrected.      Ursula Alert, MD 02/26/2022, 12:57 PM

## 2022-03-11 ENCOUNTER — Other Ambulatory Visit: Payer: Self-pay | Admitting: Family Medicine

## 2022-03-11 DIAGNOSIS — M5442 Lumbago with sciatica, left side: Secondary | ICD-10-CM

## 2022-03-11 DIAGNOSIS — M21372 Foot drop, left foot: Secondary | ICD-10-CM

## 2022-03-19 ENCOUNTER — Other Ambulatory Visit: Payer: Self-pay

## 2022-03-19 ENCOUNTER — Ambulatory Visit (INDEPENDENT_AMBULATORY_CARE_PROVIDER_SITE_OTHER): Payer: BC Managed Care – PPO | Admitting: Licensed Clinical Social Worker

## 2022-03-19 DIAGNOSIS — F3342 Major depressive disorder, recurrent, in full remission: Secondary | ICD-10-CM

## 2022-03-19 DIAGNOSIS — F411 Generalized anxiety disorder: Secondary | ICD-10-CM

## 2022-03-19 NOTE — Progress Notes (Signed)
Virtual Visit via Video Note ? ?I connected with Jamie [name] on 03/19/22 at  8:00 AM EDT by a video enabled telemedicine application and verified that I am speaking with the correct person using two identifiers. ? ?Location: ?Patient: home ?Provider: remote office [place], [place]) ?  ?I discussed the limitations of evaluation and management by telemedicine and the availability of in person appointments. The patient expressed understanding and agreed to proceed. ?I discussed the assessment and treatment plan with the patient. The patient was provided an opportunity to ask questions and all were answered. The patient agreed with the plan and demonstrated an understanding of the instructions. ?  ?The patient was advised to call back or seek an in-person evaluation if the symptoms worsen or if the condition fails to improve as anticipated. ? ?I provided 30 minutes of non-face-to-face time during this encounter. ? ? ?Brooklin Rieger R Malone Vanblarcom, LCSW ? ? ?THERAPIST PROGRESS NOTE ? ?Session Time: 8-830a ? ?Participation Level: Active ? ?Behavioral Response: Neat and Well GroomedAlertDepressed ? ?Type of Therapy: Individual Therapy ? ?Treatment Goals addressed: Problem: Depression CCP Problem  1 Decrease depressive symptoms and improve levels of effective functioning  ?Goal: LTG: Reduce frequency, intensity, and duration of depression symptoms as evidenced by: Pt self report ?Outcome: Progressing ?Goal: STG: Jamie [name] WILL PARTICIPATE IN AT LEAST 80% OF SCHEDULED INDIVIDUAL PSYCHOTHERAPY SESSIONS ?Outcome: Progressing ? ?ProgressTowards Goals: Progressing ? ?Interventions: CBT ? ?Summary: Jamie [name] is a 56 y.o. female who presents with improving symptoms related to depression and anxiety.  Pt reports that overall mood has been stable and that she is managing stress and anxiety well. Pt reports good quality and quantity of sleep. ? ?Allowed pt to explore and express thoughts and feelings associated with recent life situations  and external stressors.Pt is continuing to have issues with work-related stress.  Pt feels her physical wellness is impacting her ability to perform at work. Pt actively seeking other positions unsuccessfully. Allowed pt to explore options and verbally weigh out pros and cons. Pt feels that she is doing a great job setting boundaries at work and saying "no" instead of pushing herself beyond her physical capabilities and regretting it.  ? ?Discussed importance of self care--pt admits that she needs to focus more on that. Pt states that she feels happy when she spends time with her family and grandchildren. Pt still trying to help her 58yo son as best as she can.  Pt reports positive family relationships. ? ?Continued recommendations are as follows: self care behaviors, positive social engagements, focusing on overall work/home/life balance, and focusing on positive physical and emotional wellness.  ? ? ?Suicidal/Homicidal: No ? ?Therapist Response: Pt is continuing to apply interventions learned in session into daily life situations. Pt is currently on track to meet goals utilizing interventions mentioned above. Personal growth and progress noted. Treatment to continue as indicated.  ? ?Plan: Return again in 4 weeks. ? ?Diagnosis: MDD (major depressive disorder), recurrent, in full remission ([place]) ? ?GAD (generalized anxiety disorder) ? ?Collaboration of Care: Other pt encouraged to continue care with psychiatrist of record, Dr. Shea Evans ? ?Patient/Guardian was advised Release of Information must be obtained prior to any record release in order to collaborate their care with an outside provider. Patient/Guardian was advised if they have not already done so to contact the registration department to sign all necessary forms in order for [place] to release information regarding their care.  ? ?Consent: Patient/Guardian gives verbal consent for treatment and assignment of benefits for  services provided during this visit.  Patient/Guardian expressed understanding and agreed to proceed.  ? ?Jaryan Chicoine R Oather Muilenburg, LCSW ?03/19/2022 ? ?

## 2022-03-19 NOTE — Plan of Care (Signed)
?  Problem: Depression CCP Problem  1 Decrease depressive symptoms and improve levels of effective functioning  ?Goal: LTG: Reduce frequency, intensity, and duration of depression symptoms as evidenced by: Pt self report ?Outcome: Progressing ?Goal: STG: [name] WILL PARTICIPATE IN AT LEAST 80% OF SCHEDULED INDIVIDUAL PSYCHOTHERAPY SESSIONS ?Outcome: Progressing ?  ?

## 2022-03-22 ENCOUNTER — Ambulatory Visit: Payer: BC Managed Care – PPO

## 2022-03-27 ENCOUNTER — Other Ambulatory Visit: Payer: Self-pay

## 2022-03-27 ENCOUNTER — Telehealth: Payer: BC Managed Care – PPO | Admitting: Psychiatry

## 2022-03-28 ENCOUNTER — Ambulatory Visit: Payer: BC Managed Care – PPO

## 2022-04-02 ENCOUNTER — Other Ambulatory Visit: Payer: Self-pay | Admitting: Psychiatry

## 2022-04-02 DIAGNOSIS — F411 Generalized anxiety disorder: Secondary | ICD-10-CM

## 2022-04-03 ENCOUNTER — Ambulatory Visit: Payer: BC Managed Care – PPO

## 2022-04-03 NOTE — Telephone Encounter (Signed)
Ordered refill of propranolol per request. Please contact the patient to make follow up appointment with Dr. Shea Evans.  ?

## 2022-04-10 ENCOUNTER — Other Ambulatory Visit: Payer: Self-pay | Admitting: Psychiatry

## 2022-04-10 DIAGNOSIS — F3342 Major depressive disorder, recurrent, in full remission: Secondary | ICD-10-CM

## 2022-05-01 ENCOUNTER — Other Ambulatory Visit: Payer: Self-pay | Admitting: Psychiatry

## 2022-05-01 DIAGNOSIS — F3342 Major depressive disorder, recurrent, in full remission: Secondary | ICD-10-CM

## 2022-05-01 DIAGNOSIS — F411 Generalized anxiety disorder: Secondary | ICD-10-CM

## 2022-05-01 DIAGNOSIS — G4701 Insomnia due to medical condition: Secondary | ICD-10-CM

## 2022-05-13 ENCOUNTER — Telehealth (INDEPENDENT_AMBULATORY_CARE_PROVIDER_SITE_OTHER): Payer: BC Managed Care – PPO | Admitting: Psychiatry

## 2022-05-13 ENCOUNTER — Encounter: Payer: Self-pay | Admitting: Psychiatry

## 2022-05-13 DIAGNOSIS — F411 Generalized anxiety disorder: Secondary | ICD-10-CM

## 2022-05-13 DIAGNOSIS — F3342 Major depressive disorder, recurrent, in full remission: Secondary | ICD-10-CM

## 2022-05-13 DIAGNOSIS — G4701 Insomnia due to medical condition: Secondary | ICD-10-CM

## 2022-05-13 MED ORDER — AMITRIPTYLINE HCL 75 MG PO TABS: 75.0000 mg | ORAL_TABLET | Freq: Every day | ORAL | 2 refills | Status: DC

## 2022-05-13 MED ORDER — GABAPENTIN 800 MG PO TABS: 800.0000 mg | ORAL_TABLET | Freq: Two times a day (BID) | ORAL | 2 refills | Status: DC

## 2022-05-13 MED ORDER — PROPRANOLOL HCL 10 MG PO TABS: ORAL_TABLET | ORAL | 2 refills | Status: DC

## 2022-05-13 MED ORDER — BREXPIPRAZOLE 2 MG PO TABS: 2.0000 mg | ORAL_TABLET | Freq: Every day | ORAL | 2 refills | Status: DC

## 2022-05-13 NOTE — Progress Notes (Signed)
Virtual Visit via Video Note ? ?I connected with Jamie [name] on 05/13/22 at  8:30 AM EDT by a video enabled telemedicine application and verified that I am speaking with the correct person using two identifiers. ? ?Location ?Provider Location : [facility] ?Patient Location : Home ? ?Participants: Patient , Provider ? ?  ?I discussed the limitations of evaluation and management by telemedicine and the availability of in person appointments. The patient expressed understanding and agreed to proceed. ?  ?I discussed the assessment and treatment plan with the patient. The patient was provided an opportunity to ask questions and all were answered. The patient agreed with the plan and demonstrated an understanding of the instructions. ?  ?The patient was advised to call back or seek an in-person evaluation if the symptoms worsen or if the condition fails to improve as anticipated. ? ? ?[place] MD OP Progress Note ? ?05/13/2022 1:57 PM ?Jamie [name]  ?MRN:  [id number] ? ?Chief Complaint:  ?Chief Complaint  ?Patient presents with  ? Follow-up: 56 year old Caucasian female with history of MDD, generalized anxiety disorder, insomnia was evaluated for medication management.  ? ?HPI: Jamie [name] is a 56 year old Caucasian female, married, lives in [place], has a history of MDD, insomnia, GAD, chronic pain was evaluated by telemedicine today. ? ?Patient today reports overall mood symptoms are stable.  She had a day or 2 couple of weeks ago when she felt depressed however was able to push herself and go to work and was able to function at work. ? ?Patient currently denies any sadness, crying spells.  Denies any anhedonia.  She does struggle with the energy level on and off mostly because of her physical problems as well as pain. ? ?Patient reports sleep is overall okay.  She sleeps around 6 to 6-1/2 hours at night.  Continues to take the amitriptyline at night.  That does help. ? ?Patient reports recently she had a procedure for  a skin lesion, currently follows up with dermatology. ? ?Patient denies any suicidality, homicidality or perceptual disturbances. ? ?Patient denies any other concerns today. ? ?Visit Diagnosis:  ?  ICD-10-CM   ?1. MDD (major depressive disorder), recurrent, in full remission ([place])  F33.42 brexpiprazole (REXULTI) 2 MG TABS tablet  ?  amitriptyline (ELAVIL) 75 MG tablet  ?  ?2. Insomnia due to medical condition  G47.01   ? pain  ?  ?3. GAD (generalized anxiety disorder)  F41.1 propranolol (INDERAL) 10 MG tablet  ?  amitriptyline (ELAVIL) 75 MG tablet  ?  gabapentin (NEURONTIN) 800 MG tablet  ?  ? ? ?Past Psychiatric History: Reviewed past psychiatric history from progress note on 03/08/2019. ? ?Past Medical History:  ?Past Medical History:  ?Diagnosis Date  ? Anxiety   ? Brachial neuritis   ? Cervical cancer ([place])   ? Chronic neck pain   ? Chronic pain not due to malignancy   ? Depressive disorder   ? followed by Dr. Alethia Berthold.  ? Disturbance, sleep   ? DVT (deep venous thrombosis) ([place])   ? left arm  ? Elevated blood pressure reading without diagnosis of hypertension   ? Embolism and thrombosis of splenic artery   ? H/O thrombophlebitis   ? Infarction of spleen   ? Lumbosacral neuritis   ? Obesity, Class II, BMI 35-39.9, with comorbidity   ?  ?Past Surgical History:  ?Procedure Laterality Date  ? CESAREAN SECTION  09/24/2010  ? IVC FILTER INSERTION N/A 05/24/2021  ? Procedure: IVC  FILTER INSERTION;  Surgeon: Algernon Huxley, MD;  Location: [facility];  Service: Cardiovascular;  Laterality: N/A;  ? IVC FILTER REMOVAL N/A 07/18/2021  ? Procedure: IVC FILTER REMOVAL;  Surgeon: Algernon Huxley, MD;  Location: [facility];  Service: Cardiovascular;  Laterality: N/A;  ? TONSILLECTOMY AND ADENOIDECTOMY  09/24/2010  ? TOTAL HIP ARTHROPLASTY Right 05/29/2021  ? Procedure: TOTAL HIP ARTHROPLASTY ANTERIOR APPROACH;  Surgeon: Hessie Knows, MD;  Location: [facility];  Service: Orthopedics;  Laterality: Right;  ? TUBAL  LIGATION  09/24/2010  ? ? ?Family Psychiatric History: Reviewed family psychiatric history from progress note on 03/08/2019. ? ?Family History:  ?Family History  ?Problem Relation Age of Onset  ? Anxiety disorder Mother   ? Depression Mother   ? Alcohol abuse Brother   ? ? ?Social History: Reviewed social history from progress note on 03/08/2019. ?Social History  ? ?Socioeconomic History  ? Marital status: Married  ?  Spouse name: rodney  ? Number of children: 3  ? Years of education: Not on file  ? Highest education level: Some college, no degree  ?Occupational History  ?  Comment: full time  ?Tobacco Use  ? Smoking status: Never  ? Smokeless tobacco: Never  ?Vaping Use  ? Vaping Use: Never used  ?Substance and Sexual Activity  ? Alcohol use: Yes  ?  Alcohol/week: 0.0 standard drinks  ?  Comment: social  ? Drug use: Not Currently  ?  Types: Marijuana  ? Sexual activity: Yes  ?  Partners: Male  ?  Birth control/protection: Surgical  ?  Comment: Tubal ligation   ?Other Topics Concern  ? Not on file  ?Social History Narrative  ? Not on file  ? ?Social Determinants of Health  ? ?Financial Resource Strain: Not on file  ?Food Insecurity: Not on file  ?Transportation Needs: Not on file  ?Physical Activity: Not on file  ?Stress: Not on file  ?Social Connections: Not on file  ? ? ?Allergies:  ?Allergies  ?Allergen Reactions  ? Hydrocodone-Acetaminophen Nausea Only  ? Neuromuscular Blocking Agents Other (See Comments)  ?  Don't work  ? ? ?Metabolic Disorder Labs: ?Lab Results  ?Component Value Date  ? HGBA1C 5.6 10/31/2021  ? MPG 114.02 10/31/2021  ? ?Lab Results  ?Component Value Date  ? PROLACTIN 13.5 10/31/2021  ? ?No results found for: CHOL, TRIG, HDL, CHOLHDL, VLDL, LDLCALC ?No results found for: TSH ? ?Therapeutic Level Labs: ?No results found for: LITHIUM ?No results found for: VALPROATE ?No components found for:  CBMZ ? ?Current Medications: ?Current Outpatient Medications  ?Medication Sig Dispense Refill  ?  amitriptyline (ELAVIL) 75 MG tablet Take 1 tablet (75 mg total) by mouth at bedtime. 30 tablet 2  ? apixaban (ELIQUIS) 2.5 MG TABS tablet Take 1 tablet (2.5 mg total) by mouth every 12 (twelve) hours. (Patient not taking: Reported on 07/18/2021) 60 tablet 3  ? BINAXNOW COVID-19 AG HOME TEST KIT Use as Directed on the Package    ? brexpiprazole (REXULTI) 2 MG TABS tablet Take 1 tablet (2 mg total) by mouth daily. 30 tablet 2  ? celecoxib (CELEBREX) 200 MG capsule Take by mouth.    ? cyclobenzaprine (FLEXERIL) 5 MG tablet Take 1 tablet by mouth 3 (three) times daily as needed. (Patient not taking: Reported on 07/18/2021)    ? docusate sodium (COLACE) 100 MG capsule Take 1 capsule (100 mg total) by mouth 2 (two) times daily. (Patient not taking: Reported on 07/18/2021)  10 capsule 0  ? doxycycline (VIBRAMYCIN) 100 MG capsule Take 100 mg by mouth 2 (two) times daily.    ? gabapentin (NEURONTIN) 800 MG tablet Take 1 tablet (800 mg total) by mouth 2 (two) times daily. 60 tablet 2  ? methocarbamol (ROBAXIN) 500 MG tablet Take 1 tablet (500 mg total) by mouth every 6 (six) hours as needed for muscle spasms. (Patient not taking: Reported on 07/18/2021) 30 tablet 0  ? oxyCODONE (OXY IR/ROXICODONE) 5 MG immediate release tablet Take 1-2 tablets (5-10 mg total) by mouth every 4 (four) hours as needed for moderate pain (pain score 4-6). (Patient not taking: Reported on 07/18/2021) 30 tablet 0  ? predniSONE (DELTASONE) 10 MG tablet 88m x 3 days, 312mx 3 days, 2078m 3 days, 90m76m3 days    ? predniSONE (DELTASONE) 10 MG tablet Take by mouth.    ? propranolol (INDERAL) 10 MG tablet TAKE 1 TABLET BY MOUTH TWICE DAILY AS NEEDED FOR  SEVERE ANXIETY 60 tablet 2  ? traMADol (ULTRAM) 50 MG tablet Take 1 tablet (50 mg total) by mouth every 6 (six) hours as needed. (Patient not taking: Reported on 07/18/2021) 30 tablet 0  ? ?No current facility-administered medications for this visit.  ? ? ? ?Musculoskeletal: ?Strength & Muscle Tone:   UTA ?Gait & Station:  Seated ?Patient leans: N/A ? ?Psychiatric Specialty Exam: ?Review of Systems  ?Musculoskeletal:   ?     Knee , right sided pain - chronic  ?Psychiatric/Behavioral:  The patient is nervous/anxious.

## 2022-05-28 ENCOUNTER — Telehealth: Payer: Self-pay

## 2022-05-28 NOTE — Telephone Encounter (Signed)
pt called left message that she wanted to find out about medications and also side effects.

## 2022-05-28 NOTE — Telephone Encounter (Signed)
left message to call office back and give more details.  Also advised pt that she could also call pharmacy and they can let her know what medications would intact with each other and also give her sideeffects also on medications . told to call office back

## 2022-05-28 NOTE — Telephone Encounter (Signed)
Thank you for reaching out to patient.  Will look forward to further clarification.

## 2022-07-23 ENCOUNTER — Other Ambulatory Visit: Payer: Self-pay | Admitting: Psychiatry

## 2022-07-23 DIAGNOSIS — F411 Generalized anxiety disorder: Secondary | ICD-10-CM

## 2022-08-01 ENCOUNTER — Ambulatory Visit: Payer: BC Managed Care – PPO | Admitting: Psychiatry

## 2022-08-06 ENCOUNTER — Telehealth: Payer: Self-pay

## 2022-08-06 DIAGNOSIS — F411 Generalized anxiety disorder: Secondary | ICD-10-CM

## 2022-08-06 MED ORDER — PROPRANOLOL HCL 10 MG PO TABS: ORAL_TABLET | ORAL | 0 refills | Status: DC

## 2022-08-06 NOTE — Telephone Encounter (Signed)
I have sent propranolol to pharmacy.  Patient with multiple no-shows, cancellations, if she does not keep her next appointment will be terminated from practice.

## 2022-08-06 NOTE — Telephone Encounter (Signed)
Pt called left message that she needs a refill on the propranolol.  Last seen on 05-13-22 next appt 09-11-22.   propranolol (INDERAL) 10 MG tablet Medication Date: 05/13/2022 Department: [facility] Ordering/Authorizing: Ursula Alert, MD   Order Providers  Prescribing Provider Encounter Provider  Ursula Alert, MD Ursula Alert, MD   Outpatient Medication Detail   Disp Refills Start End   propranolol (INDERAL) 10 MG tablet 60 tablet 2 05/13/2022    Sig: TAKE 1 TABLET BY MOUTH TWICE DAILY AS NEEDED FOR  SEVERE ANXIETY   Sent to pharmacy as: propranolol (INDERAL) 10 MG tablet   Notes to Pharmacy: For next refill   E-Prescribing Status: Receipt confirmed by pharmacy (05/13/2022  8:47 AM EDT)

## 2022-08-07 NOTE — Telephone Encounter (Signed)
pt was left a message. pt asked to call office back with any questions she may have.

## 2022-08-12 ENCOUNTER — Other Ambulatory Visit: Payer: Self-pay | Admitting: Psychiatry

## 2022-08-12 DIAGNOSIS — F3342 Major depressive disorder, recurrent, in full remission: Secondary | ICD-10-CM

## 2022-08-31 ENCOUNTER — Other Ambulatory Visit: Payer: Self-pay | Admitting: Psychiatry

## 2022-08-31 DIAGNOSIS — F3342 Major depressive disorder, recurrent, in full remission: Secondary | ICD-10-CM

## 2022-08-31 DIAGNOSIS — F411 Generalized anxiety disorder: Secondary | ICD-10-CM

## 2022-09-10 ENCOUNTER — Other Ambulatory Visit: Payer: Self-pay | Admitting: Psychiatry

## 2022-09-10 DIAGNOSIS — F3342 Major depressive disorder, recurrent, in full remission: Secondary | ICD-10-CM

## 2022-09-11 ENCOUNTER — Ambulatory Visit (INDEPENDENT_AMBULATORY_CARE_PROVIDER_SITE_OTHER): Payer: BC Managed Care – PPO | Admitting: Psychiatry

## 2022-09-11 ENCOUNTER — Encounter: Payer: Self-pay | Admitting: Psychiatry

## 2022-09-11 VITALS — BP 137/87 | HR 74 | Ht 62.5 in | Wt 168.0 lb

## 2022-09-11 DIAGNOSIS — F331 Major depressive disorder, recurrent, moderate: Secondary | ICD-10-CM

## 2022-09-11 DIAGNOSIS — G4701 Insomnia due to medical condition: Secondary | ICD-10-CM

## 2022-09-11 DIAGNOSIS — Z9189 Other specified personal risk factors, not elsewhere classified: Secondary | ICD-10-CM | POA: Diagnosis not present

## 2022-09-11 DIAGNOSIS — F411 Generalized anxiety disorder: Secondary | ICD-10-CM | POA: Diagnosis not present

## 2022-09-11 MED ORDER — REXULTI 3 MG PO TABS: 3.0000 mg | ORAL_TABLET | Freq: Every day | ORAL | 1 refills | Status: DC

## 2022-09-11 MED ORDER — GABAPENTIN 800 MG PO TABS: 800.0000 mg | ORAL_TABLET | Freq: Two times a day (BID) | ORAL | 1 refills | Status: DC

## 2022-09-11 MED ORDER — PROPRANOLOL HCL 10 MG PO TABS: ORAL_TABLET | ORAL | 0 refills | Status: DC

## 2022-09-11 MED ORDER — AMITRIPTYLINE HCL 75 MG PO TABS: 75.0000 mg | ORAL_TABLET | Freq: Every day | ORAL | 1 refills | Status: DC

## 2022-09-11 NOTE — Progress Notes (Signed)
[facility] MD OP Progress Note  09/11/2022 9:58 AM Jamie [name]  MRN:  [id number]  Chief Complaint:  Chief Complaint  Patient presents with   Follow-up: 56 year old Caucasian female with history of depression, anxiety, presented with worsening mood symptoms.   HPI: Jamie [name] is a 56 year old Caucasian female, married, lives in [place], has a history of MDD, insomnia, GAD, chronic pain was evaluated in office today.  Patient today returns reporting that she has been struggling with low motivation, sadness, crying spells.  She reports she is tired of pushing herself to do the things that she has to do every day since she does not feel any motivation to do anything.  She however continues to go to work and take care of her home and take care of her children although it has been hard.  This has been getting worse since the past several months.  Patient reports she currently has multiple psychosocial stressors including her daughter currently struggling with depression.  Her daughter is 25 years old.  Her son who is 5 years old having  behavioral problems.  Reports she worries about her children a lot.  Patient reports she has started psychotherapy with [facility], provided by her work, it is free of charge and she already has had 3 sessions.  She can have up to 10 sessions.  Patient reports she initially liked the effect of the amitriptyline since it helped her to sleep and she was waking up rested.  She continues to believe the amitriptyline is beneficial for her sleep.  Denies any current side effects.  Currently compliant on the [place].  Denies side effects.  Agreeable to dosage increase.  Continues to be compliant on gabapentin and uses propranolol as needed.  Reports her pain is currently more under control.  She is able to ambulate better and currently works as an Radio broadcast assistant.  That has helped a lot.  Denies any current suicidality, homicidality or perceptual  disturbances.  Denies any other concerns today.  Visit Diagnosis:    ICD-10-CM   1. MDD (major depressive disorder), recurrent episode, moderate (HCC)  F33.1 Brexpiprazole (REXULTI) 3 MG TABS    2. Insomnia due to medical condition  G47.01    pain    3. GAD (generalized anxiety disorder)  F41.1 Brexpiprazole (REXULTI) 3 MG TABS    propranolol (INDERAL) 10 MG tablet    amitriptyline (ELAVIL) 75 MG tablet    gabapentin (NEURONTIN) 800 MG tablet    4. At risk for prolonged QT interval syndrome  Z91.89       Past Psychiatric History: Reviewed past psychiatric history from progress note on 03/08/2019.  Past Medical History:  Past Medical History:  Diagnosis Date   Anxiety    Brachial neuritis    Cervical cancer (HCC)    Chronic neck pain    Chronic pain not due to malignancy    Depressive disorder    followed by Dr. Alethia Berthold.   Disturbance, sleep    DVT (deep venous thrombosis) (HCC)    left arm   Elevated blood pressure reading without diagnosis of hypertension    Embolism and thrombosis of splenic artery    H/O thrombophlebitis    Infarction of spleen    Lumbosacral neuritis    Obesity, Class II, BMI 35-39.9, with comorbidity     Past Surgical History:  Procedure Laterality Date   CESAREAN SECTION  09/24/2010   IVC FILTER INSERTION N/A 05/24/2021   Procedure: IVC FILTER INSERTION;  Surgeon:  Algernon Huxley, MD;  Location: [facility];  Service: Cardiovascular;  Laterality: N/A;   IVC FILTER REMOVAL N/A 07/18/2021   Procedure: IVC FILTER REMOVAL;  Surgeon: Algernon Huxley, MD;  Location: [facility];  Service: Cardiovascular;  Laterality: N/A;   TONSILLECTOMY AND ADENOIDECTOMY  09/24/2010   TOTAL HIP ARTHROPLASTY Right 05/29/2021   Procedure: TOTAL HIP ARTHROPLASTY ANTERIOR APPROACH;  Surgeon: Hessie Knows, MD;  Location: [facility];  Service: Orthopedics;  Laterality: Right;   TUBAL LIGATION  09/24/2010    Family Psychiatric History: Reviewed family  psychiatric history from progress note on 03/08/2019.  Family History:  Family History  Problem Relation Age of Onset   Anxiety disorder Mother    Depression Mother    Alcohol abuse Brother    Depression Daughter     Social History: Reviewed social history from progress note on 03/08/2019. Social History   Socioeconomic History   Marital status: Married    Spouse name: rodney   Number of children: 3   Years of education: Not on file   Highest education level: Some college, no degree  Occupational History    Comment: full time  Tobacco Use   Smoking status: Never   Smokeless tobacco: Never  Vaping Use   Vaping Use: Never used  Substance and Sexual Activity   Alcohol use: Yes    Alcohol/week: 0.0 standard drinks of alcohol    Comment: social   Drug use: Not Currently    Types: Marijuana   Sexual activity: Yes    Partners: Male    Birth control/protection: Surgical    Comment: Tubal ligation   Other Topics Concern   Not on file  Social History Narrative   Not on file   Social Determinants of Health   Financial Resource Strain: Medium Risk (02/03/2018)   Overall Financial Resource Strain (CARDIA)    Difficulty of Paying Living Expenses: Somewhat hard  Food Insecurity: Food Insecurity Present (02/03/2018)   Hunger Vital Sign    Worried About [place] in the Last Year: Sometimes true    Ran Out of Food in the Last Year: Sometimes true  Transportation Needs: No Transportation Needs (02/03/2018)   PRAPARE - Hydrologist (Medical): No    Lack of Transportation (Non-Medical): No  Physical Activity: Inactive (02/03/2018)   Exercise Vital Sign    Days of Exercise per Week: 0 days    Minutes of Exercise per Session: 0 min  Stress: Stress Concern Present (02/03/2018)   [place]    Feeling of Stress : Very much  Social Connections: Somewhat Isolated (02/03/2018)   Social  Connection and Isolation Panel [NHANES]    Frequency of Communication with Friends and Family: More than three times a week    Frequency of Social Gatherings with Friends and Family: Never    Attends Religious Services: Never    Marine scientist or Organizations: No    Attends Archivist Meetings: Never    Marital Status: Married    Allergies:  Allergies  Allergen Reactions   Hydrocodone-Acetaminophen Nausea Only   Neuromuscular Blocking Agents Other (See Comments)    Don't work    Metabolic Disorder Labs: Lab Results  Component Value Date   HGBA1C 5.6 10/31/2021   MPG 114.02 10/31/2021   Lab Results  Component Value Date   PROLACTIN 13.5 10/31/2021   No results found for: "CHOL", "TRIG", "  HDL", "CHOLHDL", "VLDL", "LDLCALC" No results found for: "TSH"  Therapeutic Level Labs: No results found for: "LITHIUM" No results found for: "VALPROATE" No results found for: "CBMZ"  Current Medications: Current Outpatient Medications  Medication Sig Dispense Refill   Brexpiprazole (REXULTI) 3 MG TABS Take 1 tablet (3 mg total) by mouth daily with supper. 30 tablet 1   amitriptyline (ELAVIL) 75 MG tablet Take 1 tablet (75 mg total) by mouth at bedtime. 30 tablet 1   BINAXNOW COVID-19 AG HOME TEST KIT Use as Directed on the Package (Patient not taking: Reported on 09/11/2022)     gabapentin (NEURONTIN) 800 MG tablet Take 1 tablet (800 mg total) by mouth 2 (two) times daily. 60 tablet 1   propranolol (INDERAL) 10 MG tablet TAKE 1 TABLET BY MOUTH TWICE DAILY AS NEEDED FOR  SEVERE ANXIETY 60 tablet 0   No current facility-administered medications for this visit.     Musculoskeletal: Strength & Muscle Tone: within normal limits Gait & Station: normal Patient leans: N/A  Psychiatric Specialty Exam: Review of Systems  Musculoskeletal:  Positive for back pain.  Psychiatric/Behavioral:  Positive for dysphoric mood. The patient is nervous/anxious.   All other systems  reviewed and are negative.   Blood pressure 137/87, pulse 74, height 5' 2.5" (1.588 m), weight 168 lb (76.2 kg), last menstrual period 02/18/2018.Body mass index is 30.24 kg/m.  General Appearance: Casual  Eye Contact:  Fair  Speech:  Clear and Coherent  Volume:  Normal  Mood:  Anxious and Depressed  Affect:  Tearful  Thought Process:  Goal Directed and Descriptions of Associations: Intact  Orientation:  Full (Time, Place, and Person)  Thought Content: Logical   Suicidal Thoughts:  No  Homicidal Thoughts:  No  Memory:  Immediate;   Fair Recent;   Fair Remote;   Fair  Judgement:  Fair  Insight:  Fair  Psychomotor Activity:  Normal  Concentration:  Concentration: Fair and Attention Span: Fair  Recall:  [employer] of Knowledge: Fair  Language: Fair  Akathisia:  No  Handed:  Right  AIMS (if indicated):  done  Assets:  Communication Skills Desire for [place] Talents/Skills Transportation  ADL's:  Intact  Cognition: WNL  Sleep:  Good   Screenings: [facility] Office Visit from 09/11/2022 in [place] Video Visit from 05/13/2022 in [place] Video Visit from 02/26/2022 in [place] Total Score 0 0 0      [place] Office Visit from 09/11/2022 in [place] Video Visit from 05/13/2022 in [place] Video Visit from 02/26/2022 in [place] Video Visit from 01/28/2022 in [place] Video Visit from 08/22/2021 in [place]  Total GAD-7 Score _0 PHQ2-9    [place] Visit from 09/11/2022 in [place] Video Visit from 05/13/2022 in [place] Video Visit from 02/26/2022 in [place] Video Visit from 01/28/2022 in [place] from 12/03/2021 in [place]  PHQ-2 Total Score 4 0 2 0 2  PHQ-9 Total Score _1 -- 7      [place] Visit from 09/11/2022 in [place] Video Visit from 05/13/2022 in [facility]22 [facility]  C-SSRS RISK CATEGORY No Risk No Risk No Risk        Assessment and Plan: TOTIANA EVERSON is a 56 year old Caucasian female who has a history of MDD, chronic pain, insomnia was evaluated in office today.  Patient with current psychosocial stressors struggles with depression and anxiety, will benefit from following plan.  Plan MDD-unstable Increase Rexulti to 3 mg p.o. nightly Elavil 75 mg p.o. nightly Continue CBT with [facility].  GAD-unstable Elavil 75 mg p.o. nightly Propranolol 10 mg p.o. twice daily as needed Patient advised to continue CBT.  If anxiety continues to get worse we will consider readjusting her other medication dosage.  Rexulti has been increased today.  Also discussed referral to IOP.  Insomnia-stable Elavil 75 mg p.o. nightly.  At risk for prolonged QT syndrome-patient advised to get the EKG done.  Provided phone #[phone].  Crisis plan discussed with patient.  Patient to call [phone] or go to the nearest emergency department.  Follow-up in clinic in 4 weeks or sooner if needed.   This note was generated in part or whole with voice recognition software. Voice recognition is usually quite accurate but there are transcription errors that can and very often do occur. I apologize for any typographical errors that were not detected and corrected.    Ursula Alert, MD 09/12/2022, 5:16 PM

## 2022-10-14 ENCOUNTER — Encounter: Payer: Self-pay | Admitting: Psychiatry

## 2022-10-14 ENCOUNTER — Telehealth (INDEPENDENT_AMBULATORY_CARE_PROVIDER_SITE_OTHER): Payer: BC Managed Care – PPO | Admitting: Psychiatry

## 2022-10-14 DIAGNOSIS — G4701 Insomnia due to medical condition: Secondary | ICD-10-CM

## 2022-10-14 DIAGNOSIS — Z9189 Other specified personal risk factors, not elsewhere classified: Secondary | ICD-10-CM

## 2022-10-14 DIAGNOSIS — F331 Major depressive disorder, recurrent, moderate: Secondary | ICD-10-CM | POA: Diagnosis not present

## 2022-10-14 DIAGNOSIS — F411 Generalized anxiety disorder: Secondary | ICD-10-CM

## 2022-10-14 MED ORDER — PROPRANOLOL HCL 10 MG PO TABS: ORAL_TABLET | ORAL | 1 refills | Status: DC

## 2022-10-14 NOTE — Progress Notes (Signed)
Virtual Visit via Video Note  I connected with Jamie [name] on 10/14/22 at  4:00 PM EDT by a video enabled telemedicine application and verified that I am speaking with the correct person using two identifiers.  Location Provider Location : [facility] Patient Location : Work  Participants: Patient , Provider    I discussed the limitations of evaluation and management by telemedicine and the availability of in person appointments. The patient expressed understanding and agreed to proceed.   I discussed the assessment and treatment plan with the patient. The patient was provided an opportunity to ask questions and all were answered. The patient agreed with the plan and demonstrated an understanding of the instructions.   The patient was advised to call back or seek an in-person evaluation if the symptoms worsen or if the condition fails to improve as anticipated.    [facility] MD OP Progress Note  10/14/2022 4:24 PM Jamie [name]  MRN:  [id number]  Chief Complaint:  Chief Complaint  Patient presents with   Follow-up   Anxiety   Depression   HPI: Jamie [name] is a 56 year old Caucasian female, married, lives in [place], has a history of MDD, insomnia, GAD, chronic pain was evaluated by telemedicine today.  Patient today reports she is currently struggling with sciatica which flared up last week, had to be out of work for 5 days.  Patient reports she has reached out to her provider and they are trying to give her other alternative treatments at this time for her pain.  Patient reports the pain does have an impact on her mood as well as energy level.  She hence has been struggling with that since the past few days.  Patient however reports the Rexulti higher dosage was helpful prior to her pain getting worse.  She hence would like to stay on this dosage.  Denies side effects.  Patient reports appetite is fair however she has been trying to cut back and eat healthy since her primary care  provider recommended that.  Patient reports sleep is overall okay.  Currently compliant on the gabapentin.  Denies side effects.  Currently compliant on propranolol as needed.  Denies side effects.  Patient denies any suicidality, homicidality or perceptual disturbances.  Was unable to get EKG completed however agrees to get it done.   Patient denies any other concerns today.  Visit Diagnosis:    ICD-10-CM   1. MDD (major depressive disorder), recurrent episode, moderate (HCC)  F33.1     2. Insomnia due to medical condition  G47.01    Pain    3. GAD (generalized anxiety disorder)  F41.1 propranolol (INDERAL) 10 MG tablet    4. At risk for prolonged QT interval syndrome  Z91.89 EKG 12-Lead      Past Psychiatric History: Reviewed past psychiatric history from progress note on 03/08/2019.  Past Medical History:  Past Medical History:  Diagnosis Date   Anxiety    Brachial neuritis    Cervical cancer (HCC)    Chronic neck pain    Chronic pain not due to malignancy    Depressive disorder    followed by Dr. Alethia Berthold.   Disturbance, sleep    DVT (deep venous thrombosis) (HCC)    left arm   Elevated blood pressure reading without diagnosis of hypertension    Embolism and thrombosis of splenic artery    H/O thrombophlebitis    Infarction of spleen    Lumbosacral neuritis    Obesity, Class II,  BMI 35-39.9, with comorbidity     Past Surgical History:  Procedure Laterality Date   CESAREAN SECTION  09/24/2010   IVC FILTER INSERTION N/A 05/24/2021   Procedure: IVC FILTER INSERTION;  Surgeon: Algernon Huxley, MD;  Location: [facility];  Service: Cardiovascular;  Laterality: N/A;   IVC FILTER REMOVAL N/A 07/18/2021   Procedure: IVC FILTER REMOVAL;  Surgeon: Algernon Huxley, MD;  Location: [facility];  Service: Cardiovascular;  Laterality: N/A;   TONSILLECTOMY AND ADENOIDECTOMY  09/24/2010   TOTAL HIP ARTHROPLASTY Right 05/29/2021   Procedure: TOTAL HIP  ARTHROPLASTY ANTERIOR APPROACH;  Surgeon: Hessie Knows, MD;  Location: [facility];  Service: Orthopedics;  Laterality: Right;   TUBAL LIGATION  09/24/2010    Family Psychiatric History: Reviewed family psychiatric history from progress note on 03/08/2019.  Family History:  Family History  Problem Relation Age of Onset   Anxiety disorder Mother    Depression Mother    Alcohol abuse Brother    Depression Daughter     Social History: Reviewed social history from progress note on 03/08/2019. Social History   Socioeconomic History   Marital status: Married    Spouse name: Jamie [name]   Number of children: 3   Years of education: Not on file   Highest education level: Some college, no degree  Occupational History    Comment: full time  Tobacco Use   Smoking status: Never   Smokeless tobacco: Never  Vaping Use   Vaping Use: Never used  Substance and Sexual Activity   Alcohol use: Yes    Alcohol/week: 0.0 standard drinks of alcohol    Comment: social   Drug use: Not Currently    Types: Marijuana   Sexual activity: Yes    Partners: Male    Birth control/protection: Surgical    Comment: Tubal ligation   Other Topics Concern   Not on file  Social History Narrative   Not on file   Social Determinants of Health   Financial Resource Strain: Medium Risk (02/03/2018)   Overall Financial Resource Strain (CARDIA)    Difficulty of Paying Living Expenses: Somewhat hard  Food Insecurity: Food Insecurity Present (02/03/2018)   Hunger Vital Sign    Worried About [place] in the Last Year: Sometimes true    Ran Out of Food in the Last Year: Sometimes true  Transportation Needs: No Transportation Needs (02/03/2018)   PRAPARE - Hydrologist (Medical): No    Lack of Transportation (Non-Medical): No  Physical Activity: Inactive (02/03/2018)   Exercise Vital Sign    Days of Exercise per Week: 0 days    Minutes of Exercise per Session: 0 min  Stress: Stress  Concern Present (02/03/2018)   [place]    Feeling of Stress : Very much  Social Connections: Somewhat Isolated (02/03/2018)   Social Connection and Isolation Panel [NHANES]    Frequency of Communication with Friends and Family: More than three times a week    Frequency of Social Gatherings with Friends and Family: Never    Attends Religious Services: Never    Marine scientist or Organizations: No    Attends Archivist Meetings: Never    Marital Status: Married    Allergies:  Allergies  Allergen Reactions   Hydrocodone-Acetaminophen Nausea Only   Neuromuscular Blocking Agents Other (See Comments)    Don't work    Metabolic Disorder Labs: Lab  Results  Component Value Date   HGBA1C 5.6 10/31/2021   MPG 114.02 10/31/2021   Lab Results  Component Value Date   PROLACTIN 13.5 10/31/2021   No results found for: "CHOL", "TRIG", "HDL", "CHOLHDL", "VLDL", "LDLCALC" No results found for: "TSH"  Therapeutic Level Labs: No results found for: "LITHIUM" No results found for: "VALPROATE" No results found for: "CBMZ"  Current Medications: Current Outpatient Medications  Medication Sig Dispense Refill   amitriptyline (ELAVIL) 75 MG tablet Take 1 tablet (75 mg total) by mouth at bedtime. 30 tablet 1   Brexpiprazole (REXULTI) 3 MG TABS Take 1 tablet (3 mg total) by mouth daily with supper. 30 tablet 1   cyclobenzaprine (FLEXERIL) 5 MG tablet Take 1 tablet by mouth 3 (three) times daily as needed.     gabapentin (NEURONTIN) 800 MG tablet Take 1 tablet (800 mg total) by mouth 2 (two) times daily. 60 tablet 1   predniSONE (DELTASONE) 20 MG tablet Take by mouth.     BINAXNOW COVID-19 AG HOME TEST KIT Use as Directed on the Package (Patient not taking: Reported on 09/11/2022)     propranolol (INDERAL) 10 MG tablet TAKE 1 TABLET BY MOUTH TWICE DAILY AS NEEDED FOR  SEVERE ANXIETY 60 tablet 1   No current  facility-administered medications for this visit.     Musculoskeletal: Strength & Muscle Tone:  UTA Gait & Station:  Seated Patient leans: N/A  Psychiatric Specialty Exam: Review of Systems  Constitutional:  Positive for fatigue.  Musculoskeletal:        Sciatica , low back pain   Psychiatric/Behavioral:  The patient is nervous/anxious.   All other systems reviewed and are negative.   Last menstrual period 02/18/2018.There is no height or weight on file to calculate BMI.  General Appearance: Casual  Eye Contact:  Fair  Speech:  Clear and Coherent  Volume:  Normal  Mood:  Anxious, mood swings due to pain  Affect:  Congruent  Thought Process:  Goal Directed and Descriptions of Associations: Intact  Orientation:  Full (Time, Place, and Person)  Thought Content: Logical   Suicidal Thoughts:  No  Homicidal Thoughts:  No  Memory:  Immediate;   Fair Recent;   Fair Remote;   Fair  Judgement:  Fair  Insight:  Fair  Psychomotor Activity:  Normal  Concentration:  Concentration: Fair and Attention Span: Fair  Recall:  [employer] of Knowledge: Fair  Language: Fair  Akathisia:  No  Handed:  Right  AIMS (if indicated): done  Assets:  Communication Skills Desire for [place] Talents/Skills  ADL's:  Intact  Cognition: WNL  Sleep:  Fair   Screenings: AIMS    [place] Video Visit from 10/14/2022 in [place] Office Visit from 09/11/2022 in [place] Video Visit from 05/13/2022 in [place] Video Visit from 02/26/2022 in [place] Total Score 0 0 0 [place] Office Visit from 09/11/2022 in [place] Video Visit from 05/13/2022 in [place] Video Visit from 02/26/2022 in [place] Video Visit  from 01/28/2022 in [place] Video Visit from 08/22/2021 in [place]  Total GAD-7 Score 9 3 6 1 12       PHQ2-9    Flowsheet Row Video Visit from 10/14/2022 in [place] Office Visit from 09/11/2022  in [place] Video Visit from 05/13/2022 in [place] Video Visit from 02/26/2022 in [place] Video Visit from 01/28/2022 in [place]  PHQ-2 Total Score 2 4 0 2 0  PHQ-9 Total Score 5 10 1 6  --      Flowsheet Row Video Visit from 10/14/2022 in [place] Office Visit from 09/11/2022 in [place] Video Visit from 05/13/2022 in [place] No Risk No Risk No Risk        Assessment and Plan: SHAWNETTE AUGELLO is a 56 year old Caucasian female who has a history of MDD, chronic pain, insomnia was evaluated by telemedicine today.  Patient with recent exacerbation of sciatica, currently struggles with anxiety, due to the same otherwise reports medications as beneficial, will benefit from the following plan.  Plan MDD-some progress Rexulti 3 mg p.o. nightly Elavil 75 mg p.o. nightly Continue CBT with her therapist- [facility] , virtually.  GAD-some progress Elavil 75 mg p.o. nightly Propranolol 10 mg p.o. twice daily as needed Continue CBT  Insomnia-stable Elavil 75 mg p.o. nightly  At risk for prolonged QT syndrome-patient has been noncompliant with EKG.  Encouraged compliance.  Provided phone #[phone] again.EKG order placed.  Follow-up in clinic in 6 weeks or sooner if needed.  In the meantime patient advised to have sufficient pain management.   Collaboration of Care: Collaboration of Care: Other encouraged to follow up with her pain management  provider.  Patient/Guardian was advised Release of Information must be obtained prior to any record release in order to collaborate their care with an outside provider. Patient/Guardian was advised if they have not already done so to contact the registration department to sign all necessary forms in order for [place] to release information regarding their care.   Consent: Patient/Guardian gives verbal consent for treatment and assignment of benefits for services provided during this visit. Patient/Guardian expressed understanding and agreed to proceed.   This note was generated in part or whole with voice recognition software. Voice recognition is usually quite accurate but there are transcription errors that can and very often do occur. I apologize for any typographical errors that were not detected and corrected.      Ursula Alert, MD 10/14/2022, 4:24 PM

## 2022-10-22 ENCOUNTER — Telehealth: Payer: Self-pay

## 2022-10-22 NOTE — Telephone Encounter (Signed)
pt called states she would like to speak with doctor about medication she states that dr. Shea Evans increase the rexulti but now she is having sciatic nerve pain and depression

## 2022-10-23 NOTE — Telephone Encounter (Signed)
Will defer the letter to Dr. Shea Evans.

## 2022-10-23 NOTE — Telephone Encounter (Signed)
Left voice message to contact the office. Please advise her the following.  - Although it is not common that Rexulti cause pain, she can go back on lower dose of Rexulti if she prefers that day. Another option is to wait to hear Dr. Charlcie Cradle advice, who will be back tomorrow.  -If any other concerns and/or if she needs refill of rexulti 2 mg tab, please let me know.

## 2022-10-23 NOTE — Telephone Encounter (Signed)
called patient and she states that she stopped the predisone and she feels alittle bit better that she going to stay on the '3mg'$  of the rexulti for now.  she also asked if she can have a work note from where she was out because of her mood. she states that that she had a break down at work on Saturday and she was off Monday and she been out this week so far.Marland KitchenMarland KitchenMarland Kitchen

## 2022-10-24 ENCOUNTER — Telehealth (INDEPENDENT_AMBULATORY_CARE_PROVIDER_SITE_OTHER): Payer: BC Managed Care – PPO | Admitting: Psychiatry

## 2022-10-24 ENCOUNTER — Encounter: Payer: Self-pay | Admitting: Psychiatry

## 2022-10-24 DIAGNOSIS — F411 Generalized anxiety disorder: Secondary | ICD-10-CM | POA: Diagnosis not present

## 2022-10-24 DIAGNOSIS — F331 Major depressive disorder, recurrent, moderate: Secondary | ICD-10-CM

## 2022-10-24 DIAGNOSIS — Z79899 Other long term (current) drug therapy: Secondary | ICD-10-CM | POA: Diagnosis not present

## 2022-10-24 DIAGNOSIS — G4701 Insomnia due to medical condition: Secondary | ICD-10-CM | POA: Diagnosis not present

## 2022-10-24 MED ORDER — CARBAMAZEPINE 200 MG PO TABS: 200.0000 mg | ORAL_TABLET | Freq: Two times a day (BID) | ORAL | 1 refills | Status: DC

## 2022-10-24 NOTE — Telephone Encounter (Signed)
left message that as of yet we have not received [facility] paperwork

## 2022-10-24 NOTE — Telephone Encounter (Signed)
Ok noted  

## 2022-10-24 NOTE — Progress Notes (Signed)
Virtual Visit via Video Note  I connected with Jamie [name] on 10/24/22 at  9:00 AM EDT by a video enabled telemedicine application and verified that I am speaking with the correct person using two identifiers.  Location Provider Location : [facility] Patient Location : Home  Participants: Patient , Provider   I discussed the limitations of evaluation and management by telemedicine and the availability of in person appointments. The patient expressed understanding and agreed to proceed.   I discussed the assessment and treatment plan with the patient. The patient was provided an opportunity to ask questions and all were answered. The patient agreed with the plan and demonstrated an understanding of the instructions.   The patient was advised to call back or seek an in-person evaluation if the symptoms worsen or if the condition fails to improve as anticipated.   [facility] [place] MD OP Progress Note  10/25/2022 8:31 AM DARCEE DEKKER  MRN:  [id number]  Chief Complaint:  Chief Complaint  Patient presents with   Follow-up   Anxiety   Depression   HPI: Jamie [name] is a 56 year old Caucasian female, married, lives in [place], has a history of MDD, insomnia, GAD, chronic pain was evaluated by telemedicine today.  Patient contacted the office reporting that she has been having worsening mood symptoms and this appointment was scheduled.  Patient today appeared to be in a lot of pain.  She reports she has significant pain radiating down her right sided hip down to her legs, she was hence prescribed prednisone by her primary care provider.  Patient reports she likely had a bad reaction to the prednisone and reports her mood symptoms started getting worse and she had significant racing thoughts attention and focus problems while on the prednisone.  Patient reports she stopped the prednisone on Sunday and her symptoms are currently getting better day by day.  She woke up this morning feeling better than  yesterday.  Patient however reports she continues to have a lot of anxiety, crying spells and is interested in medication management.  She is also trying to get into touch with a therapist to get established.  Patient was unable to get EKG completed discussed last visit, agrees to get it done.  Patient reports she has been out of work since Sunday and would like to take some time off since she is unable to function at work.  Patient also aware that she needs to contact her primary care provider for management of her pain which she rates currently at a 10 out of 10, 10 being the worst.  Patient denies any suicidality, homicidality or perceptual disturbances.  Patient denies any other concerns today.  Visit Diagnosis:    ICD-10-CM   1. MDD (major depressive disorder), recurrent episode, moderate (HCC)  F33.1 carbamazepine (TEGRETOL) 200 MG tablet    2. Insomnia due to medical condition  G47.01 carbamazepine (TEGRETOL) 200 MG tablet   pain    3. GAD (generalized anxiety disorder)  F41.1     4. High risk medication use  Z79.899 Carbamazepine level, total      Past Psychiatric History: Reviewed past psychiatric history from progress note on 03/08/2019.  Past Medical History:  Past Medical History:  Diagnosis Date   Anxiety    Brachial neuritis    Cervical cancer (HCC)    Chronic neck pain    Chronic pain not due to malignancy    Depressive disorder    followed by Dr. Alethia Berthold.   Disturbance, sleep  DVT (deep venous thrombosis) (HCC)    left arm   Elevated blood pressure reading without diagnosis of hypertension    Embolism and thrombosis of splenic artery    H/O thrombophlebitis    Infarction of spleen    Lumbosacral neuritis    Obesity, Class II, BMI 35-39.9, with comorbidity     Past Surgical History:  Procedure Laterality Date   CESAREAN SECTION  09/24/2010   IVC FILTER INSERTION N/A 05/24/2021   Procedure: IVC FILTER INSERTION;  Surgeon: Algernon Huxley, MD;   Location: [facility];  Service: Cardiovascular;  Laterality: N/A;   IVC FILTER REMOVAL N/A 07/18/2021   Procedure: IVC FILTER REMOVAL;  Surgeon: Algernon Huxley, MD;  Location: [facility];  Service: Cardiovascular;  Laterality: N/A;   TONSILLECTOMY AND ADENOIDECTOMY  09/24/2010   TOTAL HIP ARTHROPLASTY Right 05/29/2021   Procedure: TOTAL HIP ARTHROPLASTY ANTERIOR APPROACH;  Surgeon: Hessie Knows, MD;  Location: [facility];  Service: Orthopedics;  Laterality: Right;   TUBAL LIGATION  09/24/2010    Family Psychiatric History: Reviewed family psychiatric history from progress note on 03/08/2019.  Family History:  Family History  Problem Relation Age of Onset   Anxiety disorder Mother    Depression Mother    Alcohol abuse Brother    Depression Daughter     Social History: Reviewed social history from progress note on 03/08/2019. Social History   Socioeconomic History   Marital status: Married    Spouse name: rodney   Number of children: 3   Years of education: Not on file   Highest education level: Some college, no degree  Occupational History    Comment: full time  Tobacco Use   Smoking status: Never   Smokeless tobacco: Never  Vaping Use   Vaping Use: Never used  Substance and Sexual Activity   Alcohol use: Yes    Alcohol/week: 0.0 standard drinks of alcohol    Comment: social   Drug use: Not Currently    Types: Marijuana   Sexual activity: Yes    Partners: Male    Birth control/protection: Surgical    Comment: Tubal ligation   Other Topics Concern   Not on file  Social History Narrative   Not on file   Social Determinants of Health   Financial Resource Strain: Medium Risk (02/03/2018)   Overall Financial Resource Strain (CARDIA)    Difficulty of Paying Living Expenses: Somewhat hard  Food Insecurity: Food Insecurity Present (02/03/2018)   Hunger Vital Sign    Worried About [place] in the Last Year: Sometimes true    Ran Out of Food in the  Last Year: Sometimes true  Transportation Needs: No Transportation Needs (02/03/2018)   PRAPARE - Hydrologist (Medical): No    Lack of Transportation (Non-Medical): No  Physical Activity: Inactive (02/03/2018)   Exercise Vital Sign    Days of Exercise per Week: 0 days    Minutes of Exercise per Session: 0 min  Stress: Stress Concern Present (02/03/2018)   JAARS    Feeling of Stress : Very much  Social Connections: Somewhat Isolated (02/03/2018)   Social Connection and Isolation Panel [NHANES]    Frequency of Communication with Friends and Family: More than three times a week    Frequency of Social Gatherings with Friends and Family: Never    Attends Religious Services: Never    Marine scientist or  Organizations: No    Attends Archivist Meetings: Never    Marital Status: Married    Allergies:  Allergies  Allergen Reactions   Hydrocodone-Acetaminophen Nausea Only   Neuromuscular Blocking Agents Other (See Comments)    Don't work    Metabolic Disorder Labs: Lab Results  Component Value Date   HGBA1C 5.6 10/31/2021   MPG 114.02 10/31/2021   Lab Results  Component Value Date   PROLACTIN 13.5 10/31/2021   No results found for: "CHOL", "TRIG", "HDL", "CHOLHDL", "VLDL", "LDLCALC" No results found for: "TSH"  Therapeutic Level Labs: No results found for: "LITHIUM" No results found for: "VALPROATE" No results found for: "CBMZ"  Current Medications: Current Outpatient Medications  Medication Sig Dispense Refill   amitriptyline (ELAVIL) 75 MG tablet Take 1 tablet (75 mg total) by mouth at bedtime. 30 tablet 1   Brexpiprazole (REXULTI) 3 MG TABS Take 1 tablet (3 mg total) by mouth daily with supper. 30 tablet 1   carbamazepine (TEGRETOL) 200 MG tablet Take 1 tablet (200 mg total) by mouth in the morning and at bedtime. 60 tablet 1   cyclobenzaprine (FLEXERIL) 5 MG  tablet Take 1 tablet by mouth 3 (three) times daily as needed.     gabapentin (NEURONTIN) 800 MG tablet Take 1 tablet (800 mg total) by mouth 2 (two) times daily. 60 tablet 1   propranolol (INDERAL) 10 MG tablet TAKE 1 TABLET BY MOUTH TWICE DAILY AS NEEDED FOR  SEVERE ANXIETY 60 tablet 1   BINAXNOW COVID-19 AG HOME TEST KIT Use as Directed on the Package (Patient not taking: Reported on 09/11/2022)     No current facility-administered medications for this visit.     Musculoskeletal: Strength & Muscle Tone:  UTA Gait & Station:  Seated Patient leans: N/A  Psychiatric Specialty Exam: Review of Systems  Constitutional:  Positive for fatigue.  Musculoskeletal:  Positive for back pain.       Pain radiating down her leg - right sided  Psychiatric/Behavioral:  Positive for decreased concentration, dysphoric mood and sleep disturbance. The patient is nervous/anxious.   All other systems reviewed and are negative.   Last menstrual period 02/18/2018.There is no height or weight on file to calculate BMI.  General Appearance: Casual  Eye Contact:  Fair  Speech:  Clear and Coherent  Volume:  Normal  Mood:  Anxious and Depressed  Affect:  Congruent  Thought Process:  Goal Directed and Descriptions of Associations: Intact  Orientation:  Full (Time, Place, and Person)  Thought Content: Logical   Suicidal Thoughts:  No  Homicidal Thoughts:  No  Memory:  Immediate;   Fair Recent;   Fair Remote;   Fair  Judgement:  Fair  Insight:  Fair  Psychomotor Activity:  Normal  Concentration:  Concentration: Fair and Attention Span: Fair  Recall:  [employer] of Knowledge: Fair  Language: Fair  Akathisia:  No  Handed:  Right  AIMS (if indicated): not done  Assets:  Communication Skills Desire for Improvement Housing Intimacy Social Support  ADL's:  Intact  Cognition: WNL  Sleep:   restless due to pain   Screenings: AIMS    Flowsheet Row Video Visit from 10/14/2022 in [facility] Office Visit from 09/11/2022 in [facility] Video Visit from 05/13/2022 in [facility] Video Visit from 02/26/2022 in [facility] Total Score 0 0 0 0      GAD-7    Flowsheet Row  Office Visit from 09/11/2022 in [place] Video Visit from 05/13/2022 in [place] Video Visit from 02/26/2022 in [place] Video Visit from 01/28/2022 in [place] Video Visit from 08/22/2021 in [facility]  Total GAD-7 Score 9 3 6 1 12       PHQ2-9    Flowsheet Row Video Visit from 10/24/2022 in [facility] Video Visit from 10/14/2022 in [facility] Office Visit from 09/11/2022 in [facility] Video Visit from 05/13/2022 in [facility] Video Visit from 02/26/2022 in [facility]  PHQ-2 Total Score 5 2 4  0 2  PHQ-9 Total Score 11 5 10 1 6       Flowsheet Row Video Visit from 10/24/2022 in [facility] Video Visit from 10/14/2022 in [facility] Office Visit from 09/11/2022 in [facility] No Risk No Risk No Risk        Assessment and Plan: Jamie [name] is a 56 year old Caucasian female who has a history of MDD, chronic pain, insomnia was evaluated by telemedicine today.  Patient with significant pain, uncontrolled, worsening mood symptoms, likely also contributed by recent prednisone therapy which she has since stopped, will benefit from the following medication plan.  Plan MDD-unstable Rexulti 3 mg p.o. nightly Elavil 75 mg p.o. nightly Add Tegretol 200 mg p.o. twice daily. Continue CBT-patient advised to  reach out to therapist to schedule an appointment-Ms. Christina Hussami  GAD-unstable Elavil 75 mg p.o. nightly Propranolol 10 mg p.o. twice daily as needed Restart CBT  Insomnia-restless due to pain Elavil 75 mg p.o. nightly  At risk for prolonged QT syndrome-patient has been noncompliant with EKG-encourage compliance.  High risk medication use-will order carbamazepine level-patient to get labs done in 5 to 7 days after starting the medication.  Reviewed labs in EHR dated 10/08/2022-CBC with differential-platelet count-within normal limits, CMP-within normal limits.  Patient is currently out of work due to her worsening pain and mood symptoms, patient advised to contact her employer and fax FMLA form-currently patient to be out of work for a week-till 10/27/2022.  She has not been going to work since 10/19/2022.  Follow-up in clinic in 2 to 3 weeks or sooner in person.  Crisis plan discussed with patient.  Collaboration of Care: Collaboration of Care: Other patient will need sufficient pain management, patient to contact her provider, patient also to contact therapist to get established for more frequent sessions.  Patient/Guardian was advised Release of Information must be obtained prior to any record release in order to collaborate their care with an outside provider. Patient/Guardian was advised if they have not already done so to contact the registration department to sign all necessary forms in order for [place] to release information regarding their care.   Consent: Patient/Guardian gives verbal consent for treatment and assignment of benefits for services provided during this visit. Patient/Guardian expressed understanding and agreed to proceed.   This note was generated in part or whole with voice recognition software. Voice recognition is usually quite accurate but there are transcription errors that can and very often do occur. I apologize for any typographical errors that were not  detected and corrected.      Ursula Alert, MD 10/25/2022, 8:31 AM

## 2022-10-24 NOTE — Telephone Encounter (Signed)
Please contact patient and advise to schedule a sooner appointment for evaluation and treatment as well as to send [place] FMLA form if needs to be completed to be out of work.

## 2022-10-25 ENCOUNTER — Ambulatory Visit
Admission: RE | Admit: 2022-10-25 | Discharge: 2022-10-25 | Disposition: A | Discharge status: Home / Self Care | Payer: BC Managed Care – PPO | Source: Ambulatory Visit | Attending: Psychiatry | Admitting: Psychiatry

## 2022-10-25 DIAGNOSIS — Z9189 Other specified personal risk factors, not elsewhere classified: Secondary | ICD-10-CM | POA: Insufficient documentation

## 2022-10-25 DIAGNOSIS — I4510 Unspecified right bundle-branch block: Secondary | ICD-10-CM | POA: Diagnosis present

## 2022-10-25 NOTE — Telephone Encounter (Signed)
pt called states that she has called HR and the [employer] dept and they have not call her back yet. she will try again today.

## 2022-10-25 NOTE — Telephone Encounter (Signed)
Noted  

## 2022-10-28 ENCOUNTER — Encounter (INDEPENDENT_AMBULATORY_CARE_PROVIDER_SITE_OTHER): Payer: Self-pay

## 2022-10-30 ENCOUNTER — Telehealth: Payer: Self-pay | Admitting: *Deleted

## 2022-10-30 NOTE — Telephone Encounter (Signed)
Patient called requested a call to discuss the paper work that has been sent she has questions & would like to talk to you.

## 2022-10-30 NOTE — Telephone Encounter (Signed)
Return call to patient.  Patient reports she had a reaction to meloxicam and hence had to take another week off from work.  Discussed with patient that I did not prescribe for meloxicam and I will not be able to take her out of work for that.  For her inability to function due to her anxiety and depression-will take her out of work-FMLA from 10/19/2022 - 10/31/2022.  Patient to go back to work on 11/01/2022.  Discussed with patient if she has further problems or worsening symptoms she may need intensive treatment like referral to PHP or IOP.  Patient also would like to have intermittent FMLA-once we receive a form, will contact patient as needed.

## 2022-10-31 ENCOUNTER — Telehealth: Payer: Self-pay | Admitting: Psychiatry

## 2022-10-31 NOTE — Telephone Encounter (Signed)
Spoke to patient she stated that she needs for the FMLA  paperwork to have the dates 10/19/22-10/31/22 she also stated that Dr Crawford Givens wrote her out from today until 11/04/22. Made pt aware that Janett Billow is out this week and will return on Monday.

## 2022-10-31 NOTE — Telephone Encounter (Signed)
I have completed FMLA form for patient as requested-10/19/2022 - 10/31/2022.  Will have [place] faxed this to her company however will have patient sign an ROI prior to sending the FMLA form.

## 2022-10-31 NOTE — Telephone Encounter (Signed)
I have sent a phone note separate about this. Thank you.

## 2022-10-31 NOTE — Telephone Encounter (Signed)
Patient left a message with staff that she wanted to clarify something about the FMLA dates.  Will have Milo contact patient to verify.

## 2022-11-01 ENCOUNTER — Other Ambulatory Visit: Payer: Self-pay | Admitting: Psychiatry

## 2022-11-01 ENCOUNTER — Other Ambulatory Visit
Admission: RE | Admit: 2022-11-01 | Discharge: 2022-11-01 | Disposition: A | Discharge status: Home / Self Care | Payer: BC Managed Care – PPO | Attending: Psychiatry | Admitting: Psychiatry

## 2022-11-01 DIAGNOSIS — F3342 Major depressive disorder, recurrent, in full remission: Secondary | ICD-10-CM

## 2022-11-01 DIAGNOSIS — F411 Generalized anxiety disorder: Secondary | ICD-10-CM

## 2022-11-01 DIAGNOSIS — Z79899 Other long term (current) drug therapy: Secondary | ICD-10-CM | POA: Insufficient documentation

## 2022-11-01 DIAGNOSIS — F331 Major depressive disorder, recurrent, moderate: Secondary | ICD-10-CM

## 2022-11-01 LAB — CARBAMAZEPINE LEVEL, TOTAL: Carbamazepine Lvl: 5.4 ug/mL (ref 4.0–12.0)

## 2022-11-01 NOTE — Telephone Encounter (Signed)
Called pt to let her know that her FMLA forms were completed and that she needs to come to the office to sign a ROI before the forms could be faxed to her employer.

## 2022-11-05 ENCOUNTER — Telehealth: Payer: Self-pay | Admitting: Psychiatry

## 2022-11-05 NOTE — Telephone Encounter (Signed)
Called pt to make aware of results she voiced understanding

## 2022-11-05 NOTE — Telephone Encounter (Signed)
Reviewed EKG for this patient.  Okay to continue current medications. Normal sinus rhythm on EKG, right bundle branch block currently present.  This is a change from her previous EKG.  If she is symptomatic, has chest pain, heart palpitations or any other symptoms, could follow up with her primary care provider.

## 2022-11-05 NOTE — Telephone Encounter (Signed)
Completed and given to [facility]

## 2022-11-06 NOTE — Telephone Encounter (Signed)
This was faxed by Tillie Rung on Friday and a copy was given to patient.

## 2022-11-20 ENCOUNTER — Encounter: Payer: Self-pay | Admitting: Psychiatry

## 2022-11-20 ENCOUNTER — Ambulatory Visit (INDEPENDENT_AMBULATORY_CARE_PROVIDER_SITE_OTHER): Payer: BC Managed Care – PPO | Admitting: Psychiatry

## 2022-11-20 VITALS — BP 137/88 | HR 80 | Temp 98.2°F | Ht 62.5 in | Wt 168.6 lb

## 2022-11-20 DIAGNOSIS — F411 Generalized anxiety disorder: Secondary | ICD-10-CM

## 2022-11-20 DIAGNOSIS — G4701 Insomnia due to medical condition: Secondary | ICD-10-CM | POA: Diagnosis not present

## 2022-11-20 DIAGNOSIS — Z79899 Other long term (current) drug therapy: Secondary | ICD-10-CM

## 2022-11-20 DIAGNOSIS — F3341 Major depressive disorder, recurrent, in partial remission: Secondary | ICD-10-CM

## 2022-11-20 MED ORDER — REXULTI 3 MG PO TABS: 1.0000 | ORAL_TABLET | Freq: Every day | ORAL | 1 refills | Status: DC

## 2022-11-20 MED ORDER — AMITRIPTYLINE HCL 75 MG PO TABS: 75.0000 mg | ORAL_TABLET | Freq: Every day | ORAL | 1 refills | Status: DC

## 2022-11-20 NOTE — Progress Notes (Signed)
[facility] MD OP Progress Note  11/20/2022 9:10 AM Jamie [name]  MRN:  [id number]  Chief Complaint:  Chief Complaint  Patient presents with   Follow-up   Medication Refill   Anxiety   Depression   HPI: Jamie [name] is a 56 year old Caucasian female, married, lives in [place], has a history of MDD, insomnia, GAD, chronic pain was evaluated in office today.  Patient today appeared to be in pain, reports she is currently struggling with sciatica pain.  Patient reports she does have upcoming appointment with her provider at [facility] on Monday, looks forward to that.  Patient reports since being on the carbamazepine her mood symptoms as improved.  She continues to be compliant on the Rexulti and the amitriptyline.  She reports when she wakes up in the morning there has been days when she feels she has jerks of upper extremities however it does not bother her and it does not last too long.  Unknown if due to any of her medications including Rexulti.  Patient to monitor this.  Otherwise denies any side effects.  Reports she is currently back at work.  Reports work has been supportive and she has been doing what she can at work.  Currently looking for a job at  [employer].  She believes it is going to be a better paid job and it will be less stressful with regards to physical effects.  Patient reports sleep as restless due to pain however she has been sleeping better than before.  Denies any suicidality, homicidality or perceptual disturbances.  Patient denies any other concerns today.    Visit Diagnosis:    ICD-10-CM   1. MDD (major depressive disorder), recurrent, in partial remission (HCC)  F33.41 Brexpiprazole (REXULTI) 3 MG TABS    2. Insomnia due to medical condition  G47.01    pain    3. GAD (generalized anxiety disorder)  F41.1 amitriptyline (ELAVIL) 75 MG tablet    Brexpiprazole (REXULTI) 3 MG TABS    4. High risk medication use  Z79.899       Past Psychiatric History:  Reviewed past psychiatric history from progress note on 03/08/2019.  Past Medical History:  Past Medical History:  Diagnosis Date   Anxiety    Brachial neuritis    Cervical cancer (HCC)    Chronic neck pain    Chronic pain not due to malignancy    Depressive disorder    followed by Dr. Alethia Berthold.   Disturbance, sleep    DVT (deep venous thrombosis) (HCC)    left arm   Elevated blood pressure reading without diagnosis of hypertension    Embolism and thrombosis of splenic artery    H/O thrombophlebitis    Infarction of spleen    Lumbosacral neuritis    Obesity, Class II, BMI 35-39.9, with comorbidity     Past Surgical History:  Procedure Laterality Date   CESAREAN SECTION  09/24/2010   IVC FILTER INSERTION N/A 05/24/2021   Procedure: IVC FILTER INSERTION;  Surgeon: Algernon Huxley, MD;  Location: [facility];  Service: Cardiovascular;  Laterality: N/A;   IVC FILTER REMOVAL N/A 07/18/2021   Procedure: IVC FILTER REMOVAL;  Surgeon: Algernon Huxley, MD;  Location: [facility];  Service: Cardiovascular;  Laterality: N/A;   TONSILLECTOMY AND ADENOIDECTOMY  09/24/2010   TOTAL HIP ARTHROPLASTY Right 05/29/2021   Procedure: TOTAL HIP ARTHROPLASTY ANTERIOR APPROACH;  Surgeon: Hessie Knows, MD;  Location: [facility];  Service: Orthopedics;  Laterality:  Right;   TUBAL LIGATION  09/24/2010    Family Psychiatric History: Reviewed family psychiatric history from progress note on 03/08/2019.  Family History:  Family History  Problem Relation Age of Onset   Anxiety disorder Mother    Depression Mother    Alcohol abuse Brother    Depression Daughter     Social History: Reviewed social history from progress note on 03/08/2019.   Social History   Socioeconomic History   Marital status: Married    Spouse name: rodney   Number of children: 3   Years of education: Not on file   Highest education level: Some college, no degree  Occupational History    Comment: full time   Tobacco Use   Smoking status: Never   Smokeless tobacco: Never  Vaping Use   Vaping Use: Never used  Substance and Sexual Activity   Alcohol use: Yes    Alcohol/week: 0.0 standard drinks of alcohol    Comment: social   Drug use: Not Currently    Types: Marijuana   Sexual activity: Yes    Partners: Male    Birth control/protection: Surgical    Comment: Tubal ligation   Other Topics Concern   Not on file  Social History Narrative   Not on file   Social Determinants of Health   Financial Resource Strain: Medium Risk (02/03/2018)   Overall Financial Resource Strain (CARDIA)    Difficulty of Paying Living Expenses: Somewhat hard  Food Insecurity: Food Insecurity Present (02/03/2018)   Hunger Vital Sign    Worried About [place] in the Last Year: Sometimes true    Ran Out of Food in the Last Year: Sometimes true  Transportation Needs: No Transportation Needs (02/03/2018)   PRAPARE - Hydrologist (Medical): No    Lack of Transportation (Non-Medical): No  Physical Activity: Inactive (02/03/2018)   Exercise Vital Sign    Days of Exercise per Week: 0 days    Minutes of Exercise per Session: 0 min  Stress: Stress Concern Present (02/03/2018)   [place]    Feeling of Stress : Very much  Social Connections: Somewhat Isolated (02/03/2018)   Social Connection and Isolation Panel [NHANES]    Frequency of Communication with Friends and Family: More than three times a week    Frequency of Social Gatherings with Friends and Family: Never    Attends Religious Services: Never    Marine scientist or Organizations: No    Attends Archivist Meetings: Never    Marital Status: Married    Allergies:  Allergies  Allergen Reactions   Hydrocodone-Acetaminophen Nausea Only   Neuromuscular Blocking Agents Other (See Comments)    Don't work    Metabolic Disorder Labs: Lab Results   Component Value Date   HGBA1C 5.6 10/31/2021   MPG 114.02 10/31/2021   Lab Results  Component Value Date   PROLACTIN 13.5 10/31/2021   No results found for: "CHOL", "TRIG", "HDL", "CHOLHDL", "VLDL", "LDLCALC" No results found for: "TSH"  Therapeutic Level Labs: No results found for: "LITHIUM" No results found for: "VALPROATE" Lab Results  Component Value Date   CBMZ 5.4 11/01/2022    Current Medications: Current Outpatient Medications  Medication Sig Dispense Refill   BINAXNOW COVID-19 AG HOME TEST KIT      carbamazepine (TEGRETOL) 200 MG tablet Take 1 tablet (200 mg total) by mouth in the morning and at bedtime. 60 tablet  1   gabapentin (NEURONTIN) 800 MG tablet Take 1 tablet (800 mg total) by mouth 2 (two) times daily. 60 tablet 1   oxyCODONE-acetaminophen (PERCOCET/ROXICET) 5-325 MG tablet Take by mouth.     propranolol (INDERAL) 10 MG tablet TAKE 1 TABLET BY MOUTH TWICE DAILY AS NEEDED FOR  SEVERE ANXIETY 60 tablet 1   amitriptyline (ELAVIL) 75 MG tablet Take 1 tablet (75 mg total) by mouth at bedtime. 30 tablet 1   Brexpiprazole (REXULTI) 3 MG TABS Take 1 tablet (3 mg total) by mouth daily with supper. 30 tablet 1   cyclobenzaprine (FLEXERIL) 5 MG tablet Take 1 tablet by mouth 3 (three) times daily as needed. (Patient not taking: Reported on 11/20/2022)     meloxicam (MOBIC) 15 MG tablet Take by mouth. (Patient not taking: Reported on 11/20/2022)     No current facility-administered medications for this visit.     Musculoskeletal: Strength & Muscle Tone: within normal limits Gait & Station:  slow due to pain Patient leans: N/A  Psychiatric Specialty Exam: Review of Systems  Constitutional:  Positive for fatigue.  Musculoskeletal:  Positive for back pain.  Psychiatric/Behavioral:  Positive for sleep disturbance. The patient is nervous/anxious.   All other systems reviewed and are negative.   Blood pressure 137/88, pulse 80, temperature 98.2 F (36.8 C),  temperature source Oral, height 5' 2.5" (1.588 m), weight 168 lb 9.6 oz (76.5 kg), last menstrual period 02/18/2018.Body mass index is 30.35 kg/m.  General Appearance: Casual  Eye Contact:  Fair  Speech:  Clear and Coherent  Volume:  Normal  Mood:  Anxious  Affect:  Appropriate  Thought Process:  Goal Directed and Descriptions of Associations: Intact  Orientation:  Full (Time, Place, and Person)  Thought Content: Logical   Suicidal Thoughts:  No  Homicidal Thoughts:  No  Memory:  Immediate;   Fair Recent;   Fair Remote;   Fair  Judgement:  Fair  Insight:  Fair  Psychomotor Activity:  Decreased  Concentration:  Concentration: Fair and Attention Span: Fair  Recall:  [employer] of Knowledge: Fair  Language: Fair  Akathisia:  No  Handed:  Right  AIMS (if indicated): done  Assets:  Communication Skills Desire for Improvement Housing Social Support  ADL's:  Intact  Cognition: WNL  Sleep:   restless due to pain   Screenings: [place] Office Visit from 11/20/2022 in [place] Video Visit from 10/14/2022 in [place] Office Visit from 09/11/2022 in [place] Video Visit from 05/13/2022 in [place] Video Visit from 02/26/2022 in [place] Total Score 0 0 0 0 0      [place] Visit from 11/20/2022 in [place] Office Visit from 09/11/2022 in [place] Video Visit from 05/13/2022 in [place] Video Visit from 02/26/2022 in [place] Video Visit from 01/28/2022 in [place]  Total GAD-7 Score _0 PHQ2-9    [place] Visit from 11/20/2022 in [place] Video Visit from 10/24/2022 in [place] Video Visit from 10/14/2022 in [place] Office Visit from 09/11/2022 in [place] Video Visit from 05/13/2022 in [place]  PHQ-2 Total Score _1 0  PHQ-9 Total Score 5  _0 Flowsheet Row Office Visit from 11/20/2022 in [place] Video Visit from 10/24/2022 in [place] Video Visit from 10/14/2022 in [place] No Risk No Risk No Risk        Assessment and Plan: Jamie [name] is a 56 year old Caucasian female who has a history of MDD, chronic pain, insomnia was evaluated in office today.  Patient with significant back pain continues to struggle with sleep and fatigue due to the same otherwise doing better with regards to her mood.  Discussed plan as noted below.  Plan  MDD in partial remission Rexulti 3 mg p.o. nightly Elavil 75 mg p.o. nightly Tegretol 200 mg p.o. twice daily Patient advised to continue CBT  GAD-improving Elavil 75 mg p.o. nightly Propranolol 10 mg p.o. twice daily as needed Patient advised to continue CBT  Insomnia-restless due to pain She will need sufficient pain management.  Has upcoming appointment with her providers on Monday 27th November. Elavil 75 mg p.o. nightly  High risk medication use-reviewed and discussed carbamazepine level dated 11/01/2022-5.4-therapeutic EKG reviewed-10/25/2022-normal sinus rhythm-[id number].  Right bundle branch block.  Follow-up in clinic in 2 months or sooner if needed.   This note was generated in part or whole with voice recognition software. Voice recognition is usually quite accurate but there are transcription errors that can and very often do occur. I apologize for any typographical errors that were not detected and corrected.     Ursula Alert, MD 11/20/2022, 9:10  AM

## 2022-11-25 ENCOUNTER — Other Ambulatory Visit: Payer: Self-pay | Admitting: Physical Medicine & Rehabilitation

## 2022-11-25 DIAGNOSIS — M48062 Spinal stenosis, lumbar region with neurogenic claudication: Secondary | ICD-10-CM

## 2022-11-26 ENCOUNTER — Other Ambulatory Visit: Payer: BC Managed Care – PPO

## 2022-11-28 ENCOUNTER — Telehealth: Payer: BC Managed Care – PPO | Admitting: Psychiatry

## 2022-11-30 ENCOUNTER — Other Ambulatory Visit: Payer: Self-pay | Admitting: Psychiatry

## 2022-11-30 DIAGNOSIS — F411 Generalized anxiety disorder: Secondary | ICD-10-CM

## 2022-12-20 ENCOUNTER — Other Ambulatory Visit: Payer: BC Managed Care – PPO

## 2022-12-24 ENCOUNTER — Other Ambulatory Visit: Payer: Self-pay | Admitting: Psychiatry

## 2022-12-24 DIAGNOSIS — F331 Major depressive disorder, recurrent, moderate: Secondary | ICD-10-CM

## 2022-12-24 DIAGNOSIS — F411 Generalized anxiety disorder: Secondary | ICD-10-CM

## 2022-12-24 DIAGNOSIS — G4701 Insomnia due to medical condition: Secondary | ICD-10-CM

## 2023-01-01 ENCOUNTER — Other Ambulatory Visit: Payer: Self-pay | Admitting: Psychiatry

## 2023-01-01 DIAGNOSIS — F411 Generalized anxiety disorder: Secondary | ICD-10-CM

## 2023-01-07 ENCOUNTER — Telehealth: Payer: BC Managed Care – PPO | Admitting: Psychiatry

## 2023-01-22 ENCOUNTER — Telehealth (INDEPENDENT_AMBULATORY_CARE_PROVIDER_SITE_OTHER): Payer: BC Managed Care – PPO | Admitting: Psychiatry

## 2023-01-22 ENCOUNTER — Encounter: Payer: Self-pay | Admitting: Psychiatry

## 2023-01-22 DIAGNOSIS — G4701 Insomnia due to medical condition: Secondary | ICD-10-CM | POA: Diagnosis not present

## 2023-01-22 DIAGNOSIS — F411 Generalized anxiety disorder: Secondary | ICD-10-CM

## 2023-01-22 DIAGNOSIS — M5431 Sciatica, right side: Secondary | ICD-10-CM

## 2023-01-22 DIAGNOSIS — F3342 Major depressive disorder, recurrent, in full remission: Secondary | ICD-10-CM | POA: Diagnosis not present

## 2023-01-22 MED ORDER — AMITRIPTYLINE HCL 75 MG PO TABS: 75.0000 mg | ORAL_TABLET | Freq: Every day | ORAL | 1 refills | Status: DC

## 2023-01-22 MED ORDER — PROPRANOLOL HCL 10 MG PO TABS: 10.0000 mg | ORAL_TABLET | Freq: Two times a day (BID) | ORAL | 1 refills | Status: DC | PRN

## 2023-01-22 MED ORDER — REXULTI 3 MG PO TABS: 1.0000 | ORAL_TABLET | Freq: Every day | ORAL | 1 refills | Status: DC

## 2023-01-22 MED ORDER — CARBAMAZEPINE 200 MG PO TABS: ORAL_TABLET | ORAL | 1 refills | Status: DC

## 2023-01-22 NOTE — Progress Notes (Unsigned)
Virtual Visit via Video Note  I connected with Jamie [name] on 01/22/23 at  3:30 PM EST by a video enabled telemedicine application and verified that I am speaking with the correct person using two identifiers.  Location Provider Location : [facility] Patient Location : Car  Participants: Patient , Provider   I discussed the limitations of evaluation and management by telemedicine and the availability of in person appointments. The patient expressed understanding and agreed to proceed.   I discussed the assessment and treatment plan with the patient. The patient was provided an opportunity to ask questions and all were answered. The patient agreed with the plan and demonstrated an understanding of the instructions.   The patient was advised to call back or seek an in-person evaluation if the symptoms worsen or if the condition fails to improve as anticipated.   [facility] MD OP Progress Note  01/22/2023 4:00 PM Jamie [name]  MRN:  [id number]  Chief Complaint:  Chief Complaint  Patient presents with   Follow-up   Depression   Anxiety   HPI: Jamie [name] is a 57 year old Caucasian female, married, lives in [place], has a history of MDD, insomnia, GAD, chronic pain was evaluated by telemedicine today.  Patient today reports she currently works at [employer].  She reports she has a good job and a good schedule.  She works first shift, her work is done by [employer].  She has has a lot of time once she gets back home to spend with her children.  That has been beneficial.  Patient however reports she does struggle with her relationship with her spouse.  She recently caught him texting someone and she worries about that.  She reports she tried to have a conversation with him however that was not successful.  Agreeable to contact her therapist to have few sessions.  Open to starting family counseling.  Patient reports overall current medication regimen is beneficial.  She is tolerating the  carbamazepine well.  Denies side effects.  She reports she continues to have sciatica, right-sided lower extremity.  She reports she is currently working with her providers on the same and they have provided her with gabapentin.  Agreeable to reach out to them for gabapentin prescription as needed.  Patient denies any suicidality, homicidality or perceptual disturbances.  Sleep is currently better as long as pain is under control.  Denies any other concerns today.  Visit Diagnosis:    ICD-10-CM   1. MDD (major depressive disorder), recurrent, in partial remission (HCC)  F33.41 Brexpiprazole (REXULTI) 3 MG TABS    2. Insomnia due to medical condition  G47.01 carbamazepine (TEGRETOL) 200 MG tablet   pain    3. GAD (generalized anxiety disorder)  F41.1 Brexpiprazole (REXULTI) 3 MG TABS    amitriptyline (ELAVIL) 75 MG tablet    propranolol (INDERAL) 10 MG tablet      Past Psychiatric History: Reviewed past psychiatric history from progress note on 03/08/2019.  Past Medical History:  Past Medical History:  Diagnosis Date   Anxiety    Brachial neuritis    Cervical cancer (HCC)    Chronic neck pain    Chronic pain not due to malignancy    Depressive disorder    followed by Dr. Alethia Berthold.   Disturbance, sleep    DVT (deep venous thrombosis) (HCC)    left arm   Elevated blood pressure reading without diagnosis of hypertension    Embolism and thrombosis of splenic artery  H/O thrombophlebitis    Infarction of spleen    Lumbosacral neuritis    Obesity, Class II, BMI 35-39.9, with comorbidity     Past Surgical History:  Procedure Laterality Date   CESAREAN SECTION  09/24/2010   IVC FILTER INSERTION N/A 05/24/2021   Procedure: IVC FILTER INSERTION;  Surgeon: Algernon Huxley, MD;  Location: [facility];  Service: Cardiovascular;  Laterality: N/A;   IVC FILTER REMOVAL N/A 07/18/2021   Procedure: IVC FILTER REMOVAL;  Surgeon: Algernon Huxley, MD;  Location: [place] [facility];   Service: Cardiovascular;  Laterality: N/A;   TONSILLECTOMY AND ADENOIDECTOMY  09/24/2010   TOTAL HIP ARTHROPLASTY Right 05/29/2021   Procedure: TOTAL HIP ARTHROPLASTY ANTERIOR APPROACH;  Surgeon: Hessie Knows, MD;  Location: [facility];  Service: Orthopedics;  Laterality: Right;   TUBAL LIGATION  09/24/2010    Family Psychiatric History: Reviewed family psychiatric history from progress note on 03/08/2019.  Family History:  Family History  Problem Relation Age of Onset   Anxiety disorder Mother    Depression Mother    Alcohol abuse Brother    Depression Daughter     Social History: Reviewed social history from progress note on 03/08/2019. Social History   Socioeconomic History   Marital status: Married    Spouse name: rodney   Number of children: 3   Years of education: Not on file   Highest education level: Some college, no degree  Occupational History    Comment: full time  Tobacco Use   Smoking status: Never   Smokeless tobacco: Never  Vaping Use   Vaping Use: Never used  Substance and Sexual Activity   Alcohol use: Yes    Alcohol/week: 0.0 standard drinks of alcohol    Comment: social   Drug use: Not Currently    Types: Marijuana   Sexual activity: Yes    Partners: Male    Birth control/protection: Surgical    Comment: Tubal ligation   Other Topics Concern   Not on file  Social History Narrative   Not on file   Social Determinants of Health   Financial Resource Strain: Medium Risk (02/03/2018)   Overall Financial Resource Strain (CARDIA)    Difficulty of Paying Living Expenses: Somewhat hard  Food Insecurity: Food Insecurity Present (02/03/2018)   Hunger Vital Sign    Worried About [place] in the Last Year: Sometimes true    Ran Out of Food in the Last Year: Sometimes true  Transportation Needs: No Transportation Needs (02/03/2018)   PRAPARE - Hydrologist (Medical): No    Lack of Transportation (Non-Medical): No  Physical  Activity: Inactive (02/03/2018)   Exercise Vital Sign    Days of Exercise per Week: 0 days    Minutes of Exercise per Session: 0 min  Stress: Stress Concern Present (02/03/2018)   [place]    Feeling of Stress : Very much  Social Connections: Somewhat Isolated (02/03/2018)   Social Connection and Isolation Panel [NHANES]    Frequency of Communication with Friends and Family: More than three times a week    Frequency of Social Gatherings with Friends and Family: Never    Attends Religious Services: Never    Marine scientist or Organizations: No    Attends Archivist Meetings: Never    Marital Status: Married    Allergies:  Allergies  Allergen Reactions   Hydrocodone-Acetaminophen Nausea Only  Neuromuscular Blocking Agents Other (See Comments)    Don't work    Metabolic Disorder Labs: Lab Results  Component Value Date   HGBA1C 5.6 10/31/2021   MPG 114.02 10/31/2021   Lab Results  Component Value Date   PROLACTIN 13.5 10/31/2021   No results found for: "CHOL", "TRIG", "HDL", "CHOLHDL", "VLDL", "LDLCALC" No results found for: "TSH"  Therapeutic Level Labs: No results found for: "LITHIUM" No results found for: "VALPROATE" Lab Results  Component Value Date   CBMZ 5.4 11/01/2022    Current Medications: Current Outpatient Medications  Medication Sig Dispense Refill   amitriptyline (ELAVIL) 75 MG tablet Take 1 tablet (75 mg total) by mouth at bedtime. 30 tablet 1   BINAXNOW COVID-19 AG HOME TEST KIT  (Patient not taking: Reported on 01/22/2023)     Brexpiprazole (REXULTI) 3 MG TABS Take 1 tablet (3 mg total) by mouth daily with supper. 30 tablet 1   carbamazepine (TEGRETOL) 200 MG tablet TAKE 1 TABLET BY MOUTH IN THE MORNING AND AT BEDTIME 60 tablet 1   cyclobenzaprine (FLEXERIL) 5 MG tablet Take 1 tablet by mouth 3 (three) times daily as needed. (Patient not taking: Reported on 11/20/2022)      meloxicam (MOBIC) 15 MG tablet Take by mouth. (Patient not taking: Reported on 11/20/2022)     propranolol (INDERAL) 10 MG tablet Take 1 tablet (10 mg total) by mouth 2 (two) times daily as needed. For anxiety 60 tablet 1   No current facility-administered medications for this visit.     Musculoskeletal: Strength & Muscle Tone:  UTA Gait & Station:  Seated Patient leans: N/A  Psychiatric Specialty Exam: Review of Systems  Musculoskeletal:        Rt.sided leg pain- chronic  Psychiatric/Behavioral:  The patient is nervous/anxious.   All other systems reviewed and are negative.   Last menstrual period 02/18/2018.There is no height or weight on file to calculate BMI.  General Appearance: Casual  Eye Contact:  Fair  Speech:  Clear and Coherent  Volume:  Normal  Mood:  Anxious coping ok  Affect:  Congruent  Thought Process:  Goal Directed and Descriptions of Associations: Intact  Orientation:  Full (Time, Place, and Person)  Thought Content: Logical   Suicidal Thoughts:  No  Homicidal Thoughts:  No  Memory:  Immediate;   Fair Recent;   Fair Remote;   Fair  Judgement:  Fair  Insight:  Fair  Psychomotor Activity:  Normal  Concentration:  Concentration: Fair and Attention Span: Fair  Recall:  [employer] of Knowledge: Fair  Language: Fair  Akathisia:  No  Handed:  Right  AIMS (if indicated): not done  Assets:  Communication Skills Desire for Improvement Housing Intimacy Social Support  ADL's:  Intact  Cognition: WNL  Sleep:  Fair   Screenings: Administrator, Civil [employer] Office Visit from 11/20/2022 in [place] Video Visit from 10/14/2022 in [place] Office Visit from 09/11/2022 in [place] Video Visit from 05/13/2022 in [place] Video Visit from 02/26/2022 in [place] Total Score 0 0 0 0 0      [place] Office Visit from 11/20/2022 in [place] Office Visit from 09/11/2022 in [place] Video Visit from 05/13/2022 in [place] Video Visit  from 02/26/2022 in [place] Video Visit from 01/28/2022 in [facility]  Total GAD-7 Score '4 9 3 6 1      '$ PHQ2-9    [place] Office Visit from 11/20/2022 in [place] Video Visit from 10/24/2022 in [place] Video Visit from 10/14/2022 in [place] Office Visit from 09/11/2022 in [place] Video Visit from 05/13/2022 in [place]  PHQ-2 Total Score '2 5 2 4 '$ 0  PHQ-9 Total Score '5 11 5 10 1      '$ Flowsheet Row Video Visit from 01/22/2023 in [place] Office Visit from 11/20/2022 in [place] Video Visit from 10/24/2022 in [place] No Risk No Risk No Risk        Assessment and Plan: Jamie [name] is a 57 year old Caucasian female who has a history of MDD, chronic pain, insomnia was evaluated by telemedicine today.  Patient is currently improving although she does struggle with relationship struggles with spouse and will benefit from psychotherapy sessions.  Continue medications as noted below.  Plan MDD in remission Rexulti 3 mg p.o. nightly Amitriptyline 75 mg p.o. nightly Tegretol 200 mg p.o. twice daily Discontinued gabapentin.  However if she wants to continue gabapentin for pain, patient to contact her  provider. Patient advised to continue CBT  GAD-improving Amitriptyline 75 mg p.o. nightly Propranolol 10 mg p.o. twice daily as needed Continue CBT  Insomnia-improving She will need sufficient pain management Amitriptyline 75 mg p.o. nightly  Patient with relationship struggles advised to follow up with a family counselor.  She will have a discussion with her individual therapist regarding this.  Follow-up in clinic in 2 months or sooner if needed.  Collaboration of Care: Collaboration of Care: Referral or follow-up with counselor/therapist AEB encouraged to follow up with therapist and family counselor.  Patient/Guardian was advised Release of Information must be obtained prior to any record release in order to collaborate their care with an outside provider. Patient/Guardian was advised if they have not already done so to contact the registration department to sign all necessary forms in order for [place] to release information regarding their care.   Consent: Patient/Guardian gives verbal consent for treatment and assignment of benefits for services provided during this visit. Patient/Guardian expressed understanding and agreed to proceed.   This note was generated in part or whole with voice recognition software. Voice recognition is usually quite accurate but there are transcription errors that can and very often do occur. I apologize for any typographical errors that were not detected and corrected.      Ursula Alert, MD 01/22/2023, 4:00 PM

## 2023-02-10 ENCOUNTER — Telehealth: Payer: Self-pay

## 2023-02-10 NOTE — Telephone Encounter (Signed)
Please look at samples available for Rexulti, we may have some here to assist this patient.

## 2023-02-10 NOTE — Telephone Encounter (Signed)
pt called states that she does not have any insurance now and that she can not afford the rexulti that even with card it will still be over $1000

## 2023-02-11 NOTE — Telephone Encounter (Signed)
As discussed labs tried to get some samples.  Please let patient be aware.

## 2023-02-11 NOTE — Telephone Encounter (Signed)
Checked closet there are no Rexulti samples

## 2023-02-11 NOTE — Telephone Encounter (Signed)
Correction-as discussed, please try to get samples.

## 2023-02-11 NOTE — Telephone Encounter (Signed)
I checked the closet and we do not have any samples Rexulti.

## 2023-02-14 ENCOUNTER — Telehealth: Payer: Self-pay

## 2023-02-14 NOTE — Telephone Encounter (Signed)
Called Rexulti drug rep was able to get patient a month supply of the Rexulti 3 mg she pick up a two week supply of the 2 and I mg drug rep sending another two week supply of 3 mg called to offer patient assistance application she stated that she would pick the application up on Monday 02/17/23 left at front desk for patient.

## 2023-02-21 NOTE — Telephone Encounter (Signed)
pt was notified that we had the rexulti '3mg'$  samples for her.

## 2023-03-04 ENCOUNTER — Telehealth: Payer: Self-pay

## 2023-03-04 NOTE — Telephone Encounter (Signed)
Patient returned [place] application in for assistance for the [place] 3 mg  faxed all the information back to [facility]  Approved the patient will receive Rexulti at no cost through 03/02/24. Patient made aware

## 2023-03-07 ENCOUNTER — Ambulatory Visit: Payer: Self-pay

## 2023-03-10 ENCOUNTER — Ambulatory Visit: Payer: Self-pay

## 2023-03-20 ENCOUNTER — Telehealth (INDEPENDENT_AMBULATORY_CARE_PROVIDER_SITE_OTHER): Payer: Self-pay | Admitting: Psychiatry

## 2023-03-20 ENCOUNTER — Encounter: Payer: Self-pay | Admitting: Psychiatry

## 2023-03-20 DIAGNOSIS — F411 Generalized anxiety disorder: Secondary | ICD-10-CM

## 2023-03-20 DIAGNOSIS — F3342 Major depressive disorder, recurrent, in full remission: Secondary | ICD-10-CM

## 2023-03-20 DIAGNOSIS — G4701 Insomnia due to medical condition: Secondary | ICD-10-CM

## 2023-03-20 MED ORDER — CARBAMAZEPINE 200 MG PO TABS: ORAL_TABLET | ORAL | 1 refills | Status: DC

## 2023-03-20 MED ORDER — PROPRANOLOL HCL 10 MG PO TABS: 10.0000 mg | ORAL_TABLET | Freq: Two times a day (BID) | ORAL | 1 refills | Status: DC | PRN

## 2023-03-20 MED ORDER — AMITRIPTYLINE HCL 75 MG PO TABS: 75.0000 mg | ORAL_TABLET | Freq: Every day | ORAL | 1 refills | Status: DC
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
## 2023-03-20 NOTE — Patient Instructions (Signed)
  [email or web address]  www.psychologytoday  [facility] [email or web address] [address], [place], [place] [address]   [phone]  [facility] [email or web address] [address], [place], [place] [address]  [phone]   [facility] - [phone]  [facility] - [phone]  [facility] - [phone]   [facility] - [phone]  [facility] - [phone]     

## 2023-03-24 ENCOUNTER — Ambulatory Visit: Payer: Self-pay

## 2023-04-02 ENCOUNTER — Ambulatory Visit (HOSPITAL_COMMUNITY): Payer: BC Managed Care – PPO | Admitting: Licensed Clinical Social Worker

## 2023-04-02 DIAGNOSIS — F411 Generalized anxiety disorder: Secondary | ICD-10-CM

## 2023-04-02 DIAGNOSIS — F3341 Major depressive disorder, recurrent, in partial remission: Secondary | ICD-10-CM
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
## 2023-04-24 ENCOUNTER — Telehealth: Payer: Self-pay | Admitting: Psychiatry

## 2023-04-30 ENCOUNTER — Ambulatory Visit (INDEPENDENT_AMBULATORY_CARE_PROVIDER_SITE_OTHER): Payer: Self-pay | Admitting: Licensed Clinical Social Worker

## 2023-04-30 ENCOUNTER — Encounter (HOSPITAL_COMMUNITY): Payer: Self-pay | Admitting: Licensed Clinical Social Worker

## 2023-04-30 DIAGNOSIS — F3341 Major depressive disorder, recurrent, in partial remission: Secondary | ICD-10-CM

## 2023-04-30 DIAGNOSIS — Z630 Problems in relationship with spouse or partner: Secondary | ICD-10-CM

## 2023-04-30 DIAGNOSIS — F411 Generalized anxiety disorder: Secondary | ICD-10-CM
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
## 2023-05-21 ENCOUNTER — Other Ambulatory Visit: Payer: Self-pay | Admitting: Psychiatry

## 2023-05-21 DIAGNOSIS — G4701 Insomnia due to medical condition: Secondary | ICD-10-CM

## 2023-05-29 ENCOUNTER — Ambulatory Visit (INDEPENDENT_AMBULATORY_CARE_PROVIDER_SITE_OTHER): Payer: Self-pay | Admitting: Licensed Clinical Social Worker

## 2023-05-29 DIAGNOSIS — F3341 Major depressive disorder, recurrent, in partial remission: Secondary | ICD-10-CM

## 2023-05-29 DIAGNOSIS — F411 Generalized anxiety disorder: Secondary | ICD-10-CM

## 2023-05-29 DIAGNOSIS — Z630 Problems in relationship with spouse or partner: Secondary | ICD-10-CM
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
## 2023-05-30 ENCOUNTER — Encounter: Payer: Self-pay | Admitting: Psychiatry

## 2023-05-30 ENCOUNTER — Telehealth (INDEPENDENT_AMBULATORY_CARE_PROVIDER_SITE_OTHER): Payer: Self-pay | Admitting: Psychiatry

## 2023-05-30 DIAGNOSIS — F411 Generalized anxiety disorder: Secondary | ICD-10-CM

## 2023-05-30 DIAGNOSIS — F331 Major depressive disorder, recurrent, moderate: Secondary | ICD-10-CM

## 2023-05-30 DIAGNOSIS — G4701 Insomnia due to medical condition: Secondary | ICD-10-CM

## 2023-05-30 MED ORDER — AMITRIPTYLINE HCL 100 MG PO TABS: 100.0000 mg | ORAL_TABLET | Freq: Every day | ORAL | 1 refills | Status: DC
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
## 2023-06-03 ENCOUNTER — Other Ambulatory Visit: Payer: Self-pay | Admitting: Psychiatry

## 2023-06-03 DIAGNOSIS — F411 Generalized anxiety disorder: Secondary | ICD-10-CM

## 2023-06-11 ENCOUNTER — Other Ambulatory Visit: Payer: Self-pay | Admitting: Psychiatry

## 2023-06-11 DIAGNOSIS — F411 Generalized anxiety disorder: Secondary | ICD-10-CM

## 2023-06-19 ENCOUNTER — Ambulatory Visit: Payer: Self-pay

## 2023-06-20 ENCOUNTER — Other Ambulatory Visit: Payer: Self-pay | Admitting: Psychiatry

## 2023-06-20 DIAGNOSIS — G4701 Insomnia due to medical condition: Secondary | ICD-10-CM

## 2023-06-23 ENCOUNTER — Ambulatory Visit: Payer: Self-pay | Admitting: Family Medicine

## 2023-06-23 DIAGNOSIS — Z708 Other sex counseling: Secondary | ICD-10-CM

## 2023-06-23 DIAGNOSIS — Z113 Encounter for screening for infections with a predominantly sexual mode of transmission: Secondary | ICD-10-CM

## 2023-06-23 DIAGNOSIS — Z114 Encounter for screening for human immunodeficiency virus [HIV]: Secondary | ICD-10-CM

## 2023-06-23 LAB — HM HIV SCREENING LAB: HM HIV Screening: NEGATIVE

## 2023-06-23 NOTE — Progress Notes (Signed)
STI clinic/screening visit [address] [place] [address] [phone]  Subjective:  Jamie [name] is a 57 y.o. female being seen today for [facility]. The patient reports they do not have symptoms.    Patient's last menstrual period was 02/18/2018 (approximate).  Patient has the following medical conditions:   Patient Active Problem List   Diagnosis Date Noted   MDD (major depressive disorder), recurrent, in full remission (HCC) 01/28/2022   MDD (major depressive disorder), recurrent, in partial remission (HCC) 10/04/2021   Severe episode of recurrent major depressive disorder, without psychotic features (HCC) 08/22/2021   S/P hip replacement 05/29/2021   Osteoarthritis of right hip 05/18/2021   Insomnia due to medical condition 02/23/2021   Noncompliance with treatment plan 10/20/2020   At risk for prolonged QT interval syndrome 08/01/2020   MDD (major depressive disorder), recurrent episode, moderate (HCC) 08/25/2019   GAD (generalized anxiety disorder) 08/25/2019   Insomnia due to mental condition 08/25/2019   Controlled substance agreement broken 10/12/2016   Neuropathy, arm, left 10/11/2016   Medication monitoring encounter 07/11/2016   Deep vein thrombosis (DVT) of left upper extremity (HCC) 06/26/2016   Pain in left arm 06/26/2016   Splenic infarct 06/26/2016   High risk medication use 06/26/2016   Abdominal pain, chronic, left upper quadrant 09/26/2015   Leg pain, anterior 08/18/2015   Chronic neck and back pain 06/02/2015   Hypertension goal BP (blood pressure) < 140/90 06/02/2015   Major depression in remission (HCC) 06/02/2015   Obesity, Class I, BMI 30-34.9 06/02/2015   Embolism of splenic artery (HCC) 09/24/2010   Chronic pain not due to malignancy 09/24/2010   History of spleen injury 09/24/2010    No chief complaint on file.   Does the patient using douching products? No  Last HIV test per patient/review of record was No  results found for: "HMHIVSCREEN"  Lab Results  Component Value Date   HIV Non Reactive 04/24/2018   Patient reports last pap was No results found for: "DIAGPAP"  Lab Results  Component Value Date   SPECADGYN Comment 03/27/2018    Screening for MPX risk: Does the patient have an unexplained rash? No Is the patient MSM? No Does the patient endorse multiple sex partners or anonymous sex partners? No Did the patient have close or sexual contact with a person diagnosed with MPX? No Has the patient traveled outside the [place] where MPX is endemic? No Is there a high clinical suspicion for MPX-- evidenced by one of the following No  -Unlikely to be chickenpox  -Lymphadenopathy  -Rash that present in same phase of evolution on any given body part See flowsheet for further details and programmatic requirements.   Immunization history:  Immunization History  Administered Date(s) Administered   Influenza,inj,Quad PF,6+ Mos 09/26/2015   Influenza-Unspecified 09/10/2013   MMR 08/07/2021, 09/17/2021   PPD Test 08/07/2021   Pneumococcal Polysaccharide-23 09/24/2010   Tdap 12/30/2009   Varicella 08/07/2021, 09/17/2021     The following portions of the patient's history were reviewed and updated as appropriate: allergies, current medications, past medical history, past social history, past surgical history and problem list.  Objective:  There were no vitals filed for this visit.  Patient seen by RN only. Self collected swabs.    Assessment and Plan:  Jamie [name] is a 57 y.o. female presenting to the [facility] for [facility]I screening in [facility]  There are no diagnoses linked to this encounter.  Patient accepted all screenings including vaginal CT/GC and bloodwork for HIV/RPR. Patient meets criteria for HepB screening? No. Ordered? not applicable Patient meets criteria for HepC screening? No. Ordered? no  Treat positive results per standing order.   Discussed time line for State Lab results and that patient will be called with positive results and encouraged patient to call if she had not heard in 2 weeks.  Recommended repeat testing in 3 months with positive results. Recommended condom use with all sex  Patient is currently using  post menopause  to prevent pregnancy.    No follow-ups on file.  Future Appointments  Date Time Provider Department Center  06/23/2023  3:15 PM Lenice Llamas, FNP [facility] None  06/25/2023  3:00 PM Hussami, Ernest Haber, LCSW [facility] None  07/17/2023  1:40 PM Jomarie Longs, MD [facility] None    Gaspar Garbe, RN

## 2023-06-25 ENCOUNTER — Ambulatory Visit (HOSPITAL_COMMUNITY): Payer: Self-pay | Admitting: Licensed Clinical Social Worker

## 2023-06-25 NOTE — Progress Notes (Signed)
Attestation of [facility]: Evaluation and management procedures were performed by the Registered Nurse under  [facility] standing orders.  RN independently saw the patient and provided screenings for them without medical exam. Patient self collected specimens. I have reviewed the RN's note and chart, and I agree with screening ordered.   Andrea Ferrer, FNP  

## 2023-06-30 ENCOUNTER — Telehealth: Payer: Self-pay | Admitting: Psychiatry

## 2023-07-01 ENCOUNTER — Ambulatory Visit (HOSPITAL_COMMUNITY): Payer: Self-pay | Admitting: Licensed Clinical Social Worker

## 2023-07-02 ENCOUNTER — Telehealth: Payer: Self-pay | Admitting: Family Medicine

## 2023-07-02 ENCOUNTER — Ambulatory Visit: Payer: Self-pay | Admitting: Psychiatry

## 2023-07-02 NOTE — Telephone Encounter (Signed)
Pt wants someone from the clinic to call her back to discuss the results of her last appt. Thanks

## 2023-07-08 ENCOUNTER — Encounter (HOSPITAL_COMMUNITY): Payer: Self-pay

## 2023-07-08 ENCOUNTER — Ambulatory Visit (HOSPITAL_COMMUNITY): Payer: Self-pay | Admitting: Licensed Clinical Social Worker

## 2023-07-14 ENCOUNTER — Telehealth (HOSPITAL_COMMUNITY): Payer: Self-pay | Admitting: Licensed Clinical Social Worker

## 2023-07-14 NOTE — Telephone Encounter (Signed)
Pt called the office and requested that clinician call her.   Clinician attempted to reach pt by phone unsuccessfully.  Left message voice mail for pt to return call.

## 2023-07-14 NOTE — Telephone Encounter (Signed)
Clinician attempted to call pt back again unsuccessfully.  Left message on pt voice mail

## 2023-07-15 ENCOUNTER — Telehealth: Payer: Self-pay | Admitting: Psychiatry

## 2023-07-15 NOTE — Telephone Encounter (Signed)
Trying to reschedule patient appointment.  7/11 & 7/16 message left answering to reschedule & mychart message sent

## 2023-07-17 ENCOUNTER — Telehealth: Payer: Self-pay | Admitting: Psychiatry

## 2023-07-30 ENCOUNTER — Ambulatory Visit (INDEPENDENT_AMBULATORY_CARE_PROVIDER_SITE_OTHER): Payer: Self-pay | Admitting: Licensed Clinical Social Worker

## 2023-07-30 ENCOUNTER — Telehealth (HOSPITAL_COMMUNITY): Payer: Self-pay | Admitting: Licensed Clinical Social Worker

## 2023-07-30 DIAGNOSIS — Z630 Problems in relationship with spouse or partner: Secondary | ICD-10-CM

## 2023-07-30 DIAGNOSIS — F331 Major depressive disorder, recurrent, moderate: Secondary | ICD-10-CM

## 2023-07-30 NOTE — Telephone Encounter (Signed)
Returned pt call--offered pt abbreviated counseling session. Pt accepted and appointment scheduled.

## 2023-07-30 NOTE — Telephone Encounter (Signed)
Patient called into the office to see if you had any cancellations for today. She stated that something happened last night and she is really needing to speak with someone and asked me to send you this message.

## 2023-07-30 NOTE — Progress Notes (Unsigned)
Virtual Visit via Video Note  I connected with Jamie [name] on 07/30/23 at  2:00 PM EDT by a video enabled telemedicine application and verified that I am speaking with the correct person using two identifiers.  Location: Patient: home Provider: remote office [place], [place])   I discussed the limitations of evaluation and management by telemedicine and the availability of in person appointments. The patient expressed understanding and agreed to proceed.   I discussed the assessment and treatment plan with the patient. The patient was provided an opportunity to ask questions and all were answered. The patient agreed with the plan and demonstrated an understanding of the instructions.   The patient was advised to call back or seek an in-person evaluation if the symptoms worsen or if the condition fails to improve as anticipated.  I provided 20 minutes of non-face-to-face time during this encounter.   Zaydah Nawabi R Imanol Bihl, LCSW   THERAPIST PROGRESS NOTE  Session Time: 1:40-2p  Participation Level: Active  Behavioral Response: Neat and Well GroomedAlertAnxious and Depressed  Type of Therapy: Individual Therapy  Treatment Goals addressed: Develop healthy thinking patterns and beliefs about self, others, and the world that lead to the alleviation and help prevent the relapse of depression per self report 3 out of 5 sessions documented.  ProgressTowards Goals: Progressing  Interventions: DBT, Solution Focused, and Supportive  Summary: Jamie [name] is a 57 y.o. female who presents with continuing symptoms related to depression and anxiety diagnoses.  Patient reports improving quality and quantity of sleep recently.  Husband texting more women  Continued recommendations are as follows: self care behaviors, positive social engagements, focusing on overall work/home/life balance, and focusing on positive physical and emotional wellness.   Suicidal/Homicidal: No  Therapist  Response: Allow patient to identify current stressors, and allow patient to assist in development of ongoing treatment plan for management of anger, management of anxiety, and management of depression.  Plan: Return again in 4 weeks.    Diagnosis:  No diagnosis found.   Collaboration of Care: Other pt to continue with   Patient/Guardian was advised Release of Information must be obtained prior to any record release in order to collaborate their care with an outside provider. Patient/Guardian was advised if they have not already done so to contact the registration department to sign all necessary forms in order for [place] to release information regarding their care.   Consent: Patient/Guardian gives verbal consent for treatment and assignment of benefits for services provided during this visit. Patient/Guardian expressed understanding and agreed to proceed.   Ernest Haber Katrine Radich, LCSW 07/30/2023

## 2023-08-03 ENCOUNTER — Other Ambulatory Visit: Payer: Self-pay | Admitting: Child and Adolescent Psychiatry

## 2023-08-03 DIAGNOSIS — G4701 Insomnia due to medical condition: Secondary | ICD-10-CM

## 2023-08-05 NOTE — Telephone Encounter (Signed)
Dr. Eappen's pt

## 2023-08-11 ENCOUNTER — Encounter: Payer: Self-pay | Admitting: Psychiatry

## 2023-08-11 ENCOUNTER — Telehealth (INDEPENDENT_AMBULATORY_CARE_PROVIDER_SITE_OTHER): Payer: Self-pay | Admitting: Psychiatry

## 2023-08-11 DIAGNOSIS — G4701 Insomnia due to medical condition: Secondary | ICD-10-CM

## 2023-08-11 DIAGNOSIS — Z79899 Other long term (current) drug therapy: Secondary | ICD-10-CM

## 2023-08-11 DIAGNOSIS — F411 Generalized anxiety disorder: Secondary | ICD-10-CM

## 2023-08-11 DIAGNOSIS — F331 Major depressive disorder, recurrent, moderate: Secondary | ICD-10-CM

## 2023-08-11 NOTE — Progress Notes (Unsigned)
Virtual Visit via Video Note  I connected with Jamie [name] on 08/11/23 at  4:30 PM EDT by a video enabled telemedicine application and verified that I am speaking with the correct person using two identifiers.  Location Provider Location : [facility] Patient Location : Work  Participants: Patient , Provider    I discussed the limitations of evaluation and management by telemedicine and the availability of in person appointments. The patient expressed understanding and agreed to proceed.    I discussed the assessment and treatment plan with the patient. The patient was provided an opportunity to ask questions and all were answered. The patient agreed with the plan and demonstrated an understanding of the instructions.   The patient was advised to call back or seek an in-person evaluation if the symptoms worsen or if the condition fails to improve as anticipated.   BH MD OP Progress Note  08/11/2023 5:15 PM Jamie [name]  MRN:  [id number]  Chief Complaint:  Chief Complaint  Patient presents with   Follow-up   Anxiety   Depression   Medication Refill   HPI: Jamie [name] is a 57 year old Caucasian female, married, lives in [place], has a history of MDD, insomnia, chronic pain was evaluated by telemedicine today.  Patient today reports she is currently living with a friend, separated from her husband.  She reports she was tired of her husband not being honest in the relationship and she felt the ' trust was broken.'  She hence decided to separate herself.  She reports she had a complete meltdown this past weekend when she had negative thoughts, sadness, crying spells, self blame, feeling guilty.  She however denies any suicidality.    Patient reports sleep is better now.  Her pain is better managed.  She currently denies any suicidality, homicidality or perceptual disturbances.  Patient reports she is currently compliant on her medications as prescribed.  Denies side  effects.  Patient currently does not have a therapist since her therapist left the practice recently.  Patient receptive to starting therapy especially family counseling given her recent relationship struggles.  She has agreed to reach out to Ms. Felecia Jan to establish care.  Offered referral to IOP however patient would like to start individual therapy first.  Patient denies any other concerns today.  Visit Diagnosis:    ICD-10-CM   1. MDD (major depressive disorder), recurrent episode, moderate (HCC)  F33.1 Sodium    2. Insomnia due to medical condition  G47.01    pain    3. GAD (generalized anxiety disorder)  F41.1     4. High risk medication use  Z79.899 Carbamazepine level, total    Platelet count    Hepatic function panel    Sodium      Past Psychiatric History: I have reviewed past psychiatric history from progress note on 03/08/2019.  Past Medical History:  Past Medical History:  Diagnosis Date   Anxiety    Brachial neuritis    Cervical cancer (HCC)    Chronic neck pain    Chronic pain not due to malignancy    Depressive disorder    followed by Dr. Mordecai Rasmussen.   Disturbance, sleep    DVT (deep venous thrombosis) (HCC)    left arm   Elevated blood pressure reading without diagnosis of hypertension    Embolism and thrombosis of splenic artery    H/O thrombophlebitis    Infarction of spleen    Lumbosacral neuritis    Obesity, Class  II, BMI 35-39.9, with comorbidity     Past Surgical History:  Procedure Laterality Date   CESAREAN SECTION  09/24/2010   IVC FILTER INSERTION N/A 05/24/2021   Procedure: IVC FILTER INSERTION;  Surgeon: Annice Needy, MD;  Location: [facility] INVASIVE CV LAB;  Service: Cardiovascular;  Laterality: N/A;   IVC FILTER REMOVAL N/A 07/18/2021   Procedure: IVC FILTER REMOVAL;  Surgeon: Annice Needy, MD;  Location: [facility] INVASIVE CV LAB;  Service: Cardiovascular;  Laterality: N/A;   TONSILLECTOMY AND ADENOIDECTOMY  09/24/2010   TOTAL HIP  ARTHROPLASTY Right 05/29/2021   Procedure: TOTAL HIP ARTHROPLASTY ANTERIOR APPROACH;  Surgeon: Kennedy Bucker, MD;  Location: [facility] ORS;  Service: Orthopedics;  Laterality: Right;   TUBAL LIGATION  09/24/2010    Family Psychiatric History: I have reviewed family psychiatric history from progress note on 03/08/2019.  Family History:  Family History  Problem Relation Age of Onset   Anxiety disorder Mother    Depression Mother    Alcohol abuse Brother    Depression Daughter     Social History: I have reviewed social history from progress note on 03/08/2019. Social History   Socioeconomic History   Marital status: Married    Spouse name: rodney   Number of children: 3   Years of education: Not on file   Highest education level: Some college, no degree  Occupational History    Comment: full time  Tobacco Use   Smoking status: Never   Smokeless tobacco: Never  Vaping Use   Vaping status: Never Used  Substance and Sexual Activity   Alcohol use: Yes    Alcohol/week: 0.0 standard drinks of alcohol    Comment: social   Drug use: Not Currently    Types: Marijuana   Sexual activity: Yes    Partners: Male    Birth control/protection: Surgical    Comment: Tubal ligation   Other Topics Concern   Not on file  Social History Narrative   Not on file   Social Determinants of Health   Financial Resource Strain: Medium Risk (02/03/2018)   Overall Financial Resource Strain (CARDIA)    Difficulty of Paying Living Expenses: Somewhat hard  Food Insecurity: Food Insecurity Present (02/03/2018)   Hunger Vital Sign    Worried About Running Out of Food in the Last Year: Sometimes true    Ran Out of Food in the Last Year: Sometimes true  Transportation Needs: No Transportation Needs (02/03/2018)   PRAPARE - Administrator, Civil Service (Medical): No    Lack of Transportation (Non-Medical): No  Physical Activity: Inactive (02/03/2018)   Exercise Vital Sign    Days of Exercise per Week: 0  days    Minutes of Exercise per Session: 0 min  Stress: Stress Concern Present (02/03/2018)   [employer] of Occupational Health - Occupational Stress Questionnaire    Feeling of Stress : Very much  Social Connections: Somewhat Isolated (02/03/2018)   Social Connection and Isolation Panel [NHANES]    Frequency of Communication with Friends and Family: More than three times a week    Frequency of Social Gatherings with Friends and Family: Never    Attends Religious Services: Never    Database administrator or Organizations: No    Attends Banker Meetings: Never    Marital Status: Married    Allergies:  Allergies  Allergen Reactions   Hydrocodone-Acetaminophen Nausea Only   Neuromuscular Blocking Agents Other (See Comments)    Don't work  Metabolic Disorder Labs: Lab Results  Component Value Date   HGBA1C 5.6 10/31/2021   MPG 114.02 10/31/2021   Lab Results  Component Value Date   PROLACTIN 13.5 10/31/2021   No results found for: "CHOL", "TRIG", "HDL", "CHOLHDL", "VLDL", "LDLCALC" No results found for: "TSH"  Therapeutic Level Labs: No results found for: "LITHIUM" No results found for: "VALPROATE" Lab Results  Component Value Date   CBMZ 5.4 11/01/2022    Current Medications: Current Outpatient Medications  Medication Sig Dispense Refill   amitriptyline (ELAVIL) 100 MG tablet Take 1 tablet (100 mg total) by mouth at bedtime. 30 tablet 1   BINAXNOW COVID-19 AG HOME TEST KIT  (Patient not taking: Reported on 01/22/2023)     Brexpiprazole (REXULTI) 3 MG TABS Take 1 tablet (3 mg total) by mouth daily with supper. 30 tablet 1   carbamazepine (TEGRETOL) 200 MG tablet TAKE 1 TABLET BY MOUTH IN THE MORNING AND 1 AT BEDTIME 60 tablet 0   gabapentin (NEURONTIN) 300 MG capsule Take 300 mg by mouth daily.     gabapentin (NEURONTIN) 400 MG capsule Take 800 mg by mouth 2 (two) times daily.     meloxicam (MOBIC) 15 MG tablet Take by mouth. (Patient not taking:  Reported on 05/30/2023)     propranolol (INDERAL) 10 MG tablet Take 1 tablet (10 mg total) by mouth 2 (two) times daily as needed. For anxiety (Patient not taking: Reported on 06/23/2023) 60 tablet 1   No current facility-administered medications for this visit.     Musculoskeletal: Strength & Muscle Tone:  UTA Gait & Station:  Seated Patient leans: N/A  Psychiatric Specialty Exam: Review of Systems  Psychiatric/Behavioral:  Positive for dysphoric mood. The patient is nervous/anxious.     Last menstrual period 02/18/2018.There is no height or weight on file to calculate BMI.  General Appearance: Fairly Groomed  Eye Contact:  Fair  Speech:  Clear and Coherent  Volume:  Normal  Mood:  Anxious and Depressed  Affect:  Congruent  Thought Process:  Goal Directed and Descriptions of Associations: Intact  Orientation:  Full (Time, Place, and Person)  Thought Content: Logical   Suicidal Thoughts:  No  Homicidal Thoughts:  No  Memory:  Immediate;   Fair Recent;   Fair Remote;   Fair  Judgement:  Fair  Insight:  Fair  Psychomotor Activity:  Normal  Concentration:  Concentration: Fair and Attention Span: Fair  Recall:  [employer] of Knowledge: Fair  Language: Fair  Akathisia:  No  Handed:  Right  AIMS (if indicated): not done  Assets:  Communication Skills Desire for Improvement Housing Social Support  ADL's:  Intact  Cognition: WNL  Sleep:  Fair   Screenings: Geneticist, molecular Office Visit from 11/20/2022 in [facility] Video Visit from 10/14/2022 in [facility] Office Visit from 09/11/2022 in [facility] Video Visit from 05/13/2022 in [facility] Video Visit from 02/26/2022 in [facility]  AIMS Total Score 0 0 0 0 0      GAD-7    Flowsheet Row Office Visit from  11/20/2022 in [facility] Office Visit from 09/11/2022 in [facility] Video Visit from 05/13/2022 in [facility] Video Visit from 02/26/2022 in [facility] Video Visit from 01/28/2022 in [facility]  Total GAD-7 Score 4 9 3 6 1       PHQ2-9    Flowsheet Row Counselor from 04/02/2023 in [facility] at [facility] Visit from 11/20/2022 in [facility] Video Visit from 10/24/2022 in [facility] Video Visit from 10/14/2022 in [facility] Office Visit from 09/11/2022 in [facility]  PHQ-2 Total Score 2 2 5 2 4   PHQ-9 Total Score 17 5 11 5 10       Flowsheet Row Video Visit from 08/11/2023 in [facility] Video Visit from 05/30/2023 in [facility] Counselor from 05/29/2023 in [facility] at [facility] RISK CATEGORY No Risk Low Risk No Risk        Assessment and Plan: Jamie [name] is a 57 year old Caucasian female who has a history of MDD, chronic pain, insomnia was evaluated by telemedicine today.  Patient with interpersonal relationship struggles with her spouse, will benefit from psychotherapy sessions, continue medication management as noted below.  Plan MDD-unstable Recently triggered by situational/relationship struggles Patient reports up until this recent episode/event she was responding to medications well. She will benefit from psychotherapy sessions-I have referred her to Ms. Felecia Jan. Continue amitriptyline 100 mg p.o. nightly Tegretol 200 mg p.o. twice  daily  GAD-unstable Referral for CBT-provided information for Ms. Felecia Jan.  Patient to reach out Continue CBT   Insomnia-improving Amitriptyline 100 mg p.o. nightly  High risk medication use-patient is currently on Tegretol, will order labs including Tegretol level, platelet count, LFT, sodium level.  [facility] lab-order placed.   I have printed out a letter for this patient to excuse her from work due to recent flareup of her symptoms.   Crisis plan discussed with patient.  Will consider referral to IOP however patient currently declines.  Follow-up in clinic in 3 to 4 weeks or sooner if needed.  Collaboration of Care: Collaboration of Care: Referral or follow-up with counselor/therapist AEB patient encouraged to establish care with therapist.  Patient/Guardian was advised Release of Information must be obtained prior to any record release in order to collaborate their care with an outside provider. Patient/Guardian was advised if they have not already done so to contact the registration department to sign all necessary forms in order for [place] to release information regarding their care.   Consent: Patient/Guardian gives verbal consent for treatment and assignment of benefits for services provided during this visit. Patient/Guardian expressed understanding and agreed to proceed.   I have spent atleast 40 minutes face to face with patient today which includes the time spent for preparing to see the patient ( e.g., review of test, records ), writing a letter for work, communicating with therapist, referring patient to a new therapist, ordering medications and test ,psychoeducation and supportive psychotherapy and care coordination,as well as documenting clinical information in electronic health record,interpreting and communication of test results   This note was generated in part or whole with voice recognition software. Voice recognition is usually quite accurate but there are  transcription errors that can and very often do occur. I apologize for any typographical errors that were not detected and corrected.    Jomarie Longs, MD 08/11/2023, 5:15 PM

## 2023-08-13 ENCOUNTER — Other Ambulatory Visit: Payer: Self-pay | Admitting: Psychiatry

## 2023-08-13 DIAGNOSIS — F411 Generalized anxiety disorder: Secondary | ICD-10-CM

## 2023-08-13 DIAGNOSIS — F331 Major depressive disorder, recurrent, moderate: Secondary | ICD-10-CM

## 2023-08-13 DIAGNOSIS — G4701 Insomnia due to medical condition: Secondary | ICD-10-CM

## 2023-08-24 ENCOUNTER — Other Ambulatory Visit: Payer: Self-pay | Admitting: Psychiatry

## 2023-08-24 DIAGNOSIS — F411 Generalized anxiety disorder: Secondary | ICD-10-CM

## 2023-09-08 ENCOUNTER — Other Ambulatory Visit: Payer: Self-pay | Admitting: Psychiatry

## 2023-09-08 DIAGNOSIS — G4701 Insomnia due to medical condition: Secondary | ICD-10-CM

## 2023-09-15 ENCOUNTER — Telehealth: Payer: Self-pay

## 2023-09-15 DIAGNOSIS — G4701 Insomnia due to medical condition: Secondary | ICD-10-CM

## 2023-09-15 MED ORDER — CARBAMAZEPINE 200 MG PO TABS
ORAL_TABLET | ORAL | 1 refills | Status: DC
Start: 2023-09-15 — End: 2023-10-22

## 2023-09-15 NOTE — Telephone Encounter (Signed)
I have sent carbamazepine to pharmacy.

## 2023-09-15 NOTE — Telephone Encounter (Signed)
pt needs refill on the carbamazepine. pt was last seen on 8-12 next appt 9-18

## 2023-09-17 ENCOUNTER — Ambulatory Visit: Payer: Self-pay | Admitting: Psychiatry

## 2023-10-10 ENCOUNTER — Other Ambulatory Visit: Payer: Self-pay | Admitting: Psychiatry

## 2023-10-10 DIAGNOSIS — F411 Generalized anxiety disorder: Secondary | ICD-10-CM

## 2023-10-17 ENCOUNTER — Encounter: Payer: Self-pay | Admitting: Emergency Medicine

## 2023-10-17 ENCOUNTER — Emergency Department: Payer: 59

## 2023-10-17 ENCOUNTER — Inpatient Hospital Stay
Admission: EM | Admit: 2023-10-17 | Discharge: 2023-10-21 | DRG: 444 | Disposition: A | Discharge status: Home / Self Care | Payer: 59 | Attending: Internal Medicine | Admitting: Internal Medicine

## 2023-10-17 ENCOUNTER — Inpatient Hospital Stay (HOSPITAL_COMMUNITY)
Admit: 2023-10-17 | Discharge: 2023-10-17 | Disposition: A | Discharge status: Home / Self Care | Payer: 59 | Attending: Internal Medicine

## 2023-10-17 ENCOUNTER — Other Ambulatory Visit: Payer: Self-pay

## 2023-10-17 DIAGNOSIS — Z86718 Personal history of other venous thrombosis and embolism: Secondary | ICD-10-CM | POA: Diagnosis not present

## 2023-10-17 DIAGNOSIS — Z811 Family history of alcohol abuse and dependence: Secondary | ICD-10-CM | POA: Diagnosis not present

## 2023-10-17 DIAGNOSIS — Z79899 Other long term (current) drug therapy: Secondary | ICD-10-CM | POA: Diagnosis not present

## 2023-10-17 DIAGNOSIS — R079 Chest pain, unspecified: Secondary | ICD-10-CM

## 2023-10-17 DIAGNOSIS — M8580 Other specified disorders of bone density and structure, unspecified site: Secondary | ICD-10-CM | POA: Diagnosis present

## 2023-10-17 DIAGNOSIS — Z885 Allergy status to narcotic agent status: Secondary | ICD-10-CM

## 2023-10-17 DIAGNOSIS — K810 Acute cholecystitis: Principal | ICD-10-CM | POA: Insufficient documentation

## 2023-10-17 DIAGNOSIS — K760 Fatty (change of) liver, not elsewhere classified: Secondary | ICD-10-CM | POA: Diagnosis present

## 2023-10-17 DIAGNOSIS — Z8541 Personal history of malignant neoplasm of cervix uteri: Secondary | ICD-10-CM | POA: Diagnosis not present

## 2023-10-17 DIAGNOSIS — Z888 Allergy status to other drugs, medicaments and biological substances status: Secondary | ICD-10-CM | POA: Diagnosis not present

## 2023-10-17 DIAGNOSIS — Z96641 Presence of right artificial hip joint: Secondary | ICD-10-CM | POA: Diagnosis present

## 2023-10-17 DIAGNOSIS — Z5941 Food insecurity: Secondary | ICD-10-CM | POA: Diagnosis not present

## 2023-10-17 DIAGNOSIS — I2694 Multiple subsegmental pulmonary emboli without acute cor pulmonale: Secondary | ICD-10-CM

## 2023-10-17 DIAGNOSIS — F419 Anxiety disorder, unspecified: Secondary | ICD-10-CM | POA: Diagnosis present

## 2023-10-17 DIAGNOSIS — I2699 Other pulmonary embolism without acute cor pulmonale: Secondary | ICD-10-CM | POA: Diagnosis present

## 2023-10-17 DIAGNOSIS — K8000 Calculus of gallbladder with acute cholecystitis without obstruction: Principal | ICD-10-CM | POA: Diagnosis present

## 2023-10-17 DIAGNOSIS — Z7901 Long term (current) use of anticoagulants: Secondary | ICD-10-CM

## 2023-10-17 DIAGNOSIS — Z5986 Financial insecurity: Secondary | ICD-10-CM

## 2023-10-17 DIAGNOSIS — Z818 Family history of other mental and behavioral disorders: Secondary | ICD-10-CM | POA: Diagnosis not present

## 2023-10-17 DIAGNOSIS — G43909 Migraine, unspecified, not intractable, without status migrainosus: Secondary | ICD-10-CM | POA: Diagnosis present

## 2023-10-17 DIAGNOSIS — F32A Depression, unspecified: Secondary | ICD-10-CM | POA: Diagnosis present

## 2023-10-17 LAB — HEPARIN LEVEL (UNFRACTIONATED)
Heparin Unfractionated: 0.71 [IU]/mL — ABNORMAL HIGH (ref 0.30–0.70)
Heparin Unfractionated: 0.45 [IU]/mL (ref 0.30–0.70)

## 2023-10-17 LAB — ECHOCARDIOGRAM COMPLETE
AR max vel: 2.75 cm2
AV Area VTI: 2.69 cm2
AV Area mean vel: 2.5 cm2
AV Mean grad: 3 mm[Hg]
AV Peak grad: 6.9 mm[Hg]
Ao pk vel: 1.31 m/s
Area-P 1/2: 3.34 cm2
Height: 62 in
MV VTI: 3.15 cm2
S' Lateral: 2.5 cm
Weight: 2464 [oz_av]

## 2023-10-17 LAB — COMPREHENSIVE METABOLIC PANEL
ALT: 42 U/L (ref 0–44)
AST: 98 U/L — ABNORMAL HIGH (ref 15–41)
Albumin: 3.7 g/dL (ref 3.5–5.0)
Alkaline Phosphatase: 100 U/L (ref 38–126)
Anion gap: 8 (ref 5–15)
BUN: 19 mg/dL (ref 6–20)
CO2: 29 mmol/L (ref 22–32)
Calcium: 8.7 mg/dL — ABNORMAL LOW (ref 8.9–10.3)
Chloride: 106 mmol/L (ref 98–111)
Creatinine, Ser: 0.87 mg/dL (ref 0.44–1.00)
GFR, Estimated: >60 mL/min (ref >60)
Glucose, Bld: 101 mg/dL — ABNORMAL HIGH (ref 70–99)
Potassium: 4 mmol/L (ref 3.5–5.1)
Sodium: 143 mmol/L (ref 135–145)
Total Bilirubin: 0.4 mg/dL (ref 0.3–1.2)
Total Protein: 7 g/dL (ref 6.5–8.1)

## 2023-10-17 LAB — CBC
HCT: 38.6 % (ref 36.0–46.0)
Hemoglobin: 12.6 g/dL (ref 12.0–15.0)
MCH: 30.5 pg (ref 26.0–34.0)
MCHC: 32.6 g/dL (ref 30.0–36.0)
MCV: 93.5 fL (ref 80.0–100.0)
Platelets: 331 10*3/uL (ref 150–400)
RBC: 4.13 MIL/uL (ref 3.87–5.11)
RDW: 12.8 % (ref 11.5–15.5)
WBC: 7.6 10*3/uL (ref 4.0–10.5)
nRBC: 0 % (ref 0.0–0.2)

## 2023-10-17 LAB — TROPONIN I (HIGH SENSITIVITY)
Troponin I (High Sensitivity): 4 ng/L (ref <18)
Troponin I (High Sensitivity): 4 ng/L (ref <18)

## 2023-10-17 LAB — PROTIME-INR
INR: 0.9 (ref 0.8–1.2)
Prothrombin Time: 12.1 s (ref 11.4–15.2)

## 2023-10-17 LAB — APTT: aPTT: 27 s (ref 24–36)

## 2023-10-17 LAB — D-DIMER, QUANTITATIVE: D-Dimer, Quant: 0.72 ug{FEU}/mL — ABNORMAL HIGH (ref 0.00–0.50)

## 2023-10-17 LAB — LIPASE, BLOOD: Lipase: 62 U/L — ABNORMAL HIGH (ref 11–51)

## 2023-10-17 LAB — HIV ANTIBODY (ROUTINE TESTING W REFLEX): HIV Screen 4th Generation wRfx: NONREACTIVE

## 2023-10-17 MED ORDER — AMITRIPTYLINE HCL 50 MG PO TABS
100.0000 mg | ORAL_TABLET | Freq: Every day | ORAL | Status: DC
  Administered 2023-10-17 – 2023-10-20 (×4): 100 mg via ORAL
  Filled 2023-10-17 (×5): qty 2

## 2023-10-17 MED ORDER — HEPARIN SODIUM (PORCINE) 5000 UNIT/ML IJ SOLN: 4000.0000 [IU] | Freq: Once | INTRAMUSCULAR | Status: DC

## 2023-10-17 MED ORDER — IOHEXOL 350 MG/ML SOLN
75.0000 mL | Freq: Once | INTRAVENOUS | Status: AC | PRN
  Administered 2023-10-17: 75 mL via INTRAVENOUS

## 2023-10-17 MED ORDER — GABAPENTIN 300 MG PO CAPS: 800.0000 mg | ORAL_CAPSULE | Freq: Two times a day (BID) | ORAL | Status: DC

## 2023-10-17 MED ORDER — ONDANSETRON HCL 4 MG/2ML IJ SOLN
4.0000 mg | Freq: Four times a day (QID) | INTRAMUSCULAR | Status: DC | PRN
  Administered 2023-10-17 – 2023-10-19 (×4): 4 mg via INTRAVENOUS
  Filled 2023-10-17 (×5): qty 2

## 2023-10-17 MED ORDER — AMITRIPTYLINE HCL 100 MG PO TABS
100.0000 mg | ORAL_TABLET | Freq: Every day | ORAL | Status: DC
  Filled 2023-10-17: qty 1

## 2023-10-17 MED ORDER — HEPARIN (PORCINE) 25000 UT/250ML-% IV SOLN
1000.0000 [IU]/h | INTRAVENOUS | Status: DC
  Administered 2023-10-17: 1050 [IU]/h via INTRAVENOUS
  Administered 2023-10-18: 1000 [IU]/h via INTRAVENOUS
  Filled 2023-10-17 (×2): qty 250

## 2023-10-17 MED ORDER — HYDROMORPHONE HCL 1 MG/ML IJ SOLN
0.5000 mg | Freq: Once | INTRAMUSCULAR | Status: AC
  Administered 2023-10-17: 0.5 mg via INTRAVENOUS
  Filled 2023-10-17: qty 0.5

## 2023-10-17 MED ORDER — FENTANYL CITRATE PF 50 MCG/ML IJ SOSY
50.0000 ug | PREFILLED_SYRINGE | Freq: Once | INTRAMUSCULAR | Status: AC
  Administered 2023-10-17: 50 ug via INTRAVENOUS
  Filled 2023-10-17: qty 1

## 2023-10-17 MED ORDER — ONDANSETRON HCL 4 MG PO TABS
4.0000 mg | ORAL_TABLET | Freq: Four times a day (QID) | ORAL | Status: DC | PRN
  Filled 2023-10-17: qty 1

## 2023-10-17 MED ORDER — BREXPIPRAZOLE 1 MG PO TABS
3.0000 mg | ORAL_TABLET | Freq: Every day | ORAL | Status: DC
  Administered 2023-10-17 – 2023-10-21 (×5): 3 mg via ORAL
  Filled 2023-10-17 (×7): qty 3

## 2023-10-17 MED ORDER — CARBAMAZEPINE 200 MG PO TABS
200.0000 mg | ORAL_TABLET | Freq: Two times a day (BID) | ORAL | Status: DC
  Administered 2023-10-17 – 2023-10-21 (×8): 200 mg via ORAL
  Filled 2023-10-17 (×10): qty 1

## 2023-10-17 MED ORDER — HEPARIN BOLUS VIA INFUSION
4000.0000 [IU] | Freq: Once | INTRAVENOUS | Status: AC
  Administered 2023-10-17: 4000 [IU] via INTRAVENOUS
  Filled 2023-10-17: qty 4000

## 2023-10-17 MED ORDER — GABAPENTIN 300 MG PO CAPS
300.0000 mg | ORAL_CAPSULE | Freq: Every day | ORAL | Status: DC
  Administered 2023-10-17 – 2023-10-20 (×4): 300 mg via ORAL
  Filled 2023-10-17 (×4): qty 1

## 2023-10-17 MED ORDER — SODIUM CHLORIDE 0.9 % IV SOLN: INTRAVENOUS | Status: AC

## 2023-10-17 MED ORDER — PROPRANOLOL HCL 10 MG PO TABS
10.0000 mg | ORAL_TABLET | Freq: Two times a day (BID) | ORAL | Status: DC | PRN
  Administered 2023-10-18: 10 mg via ORAL
  Filled 2023-10-17: qty 1

## 2023-10-17 MED ORDER — ACETAMINOPHEN 325 MG PO TABS
650.0000 mg | ORAL_TABLET | Freq: Four times a day (QID) | ORAL | Status: DC | PRN
  Administered 2023-10-17 – 2023-10-19 (×4): 650 mg via ORAL
  Filled 2023-10-17 (×5): qty 2

## 2023-10-17 MED ORDER — ACETAMINOPHEN 650 MG RE SUPP: 650.0000 mg | Freq: Four times a day (QID) | RECTAL | Status: DC | PRN

## 2023-10-17 MED ORDER — HYDROMORPHONE HCL 1 MG/ML IJ SOLN
0.5000 mg | INTRAMUSCULAR | Status: DC | PRN
  Administered 2023-10-17 – 2023-10-20 (×15): 1 mg via INTRAVENOUS
  Filled 2023-10-17 (×16): qty 1

## 2023-10-17 MED ORDER — PIPERACILLIN-TAZOBACTAM 3.375 G IVPB
3.3750 g | Freq: Three times a day (TID) | INTRAVENOUS | Status: DC
  Administered 2023-10-17 – 2023-10-21 (×13): 3.375 g via INTRAVENOUS
  Filled 2023-10-17 (×13): qty 50

## 2023-10-17 MED ORDER — PIPERACILLIN-TAZOBACTAM 3.375 G IVPB 30 MIN
3.3750 g | Freq: Once | INTRAVENOUS | Status: AC
  Administered 2023-10-17: 3.375 g via INTRAVENOUS
  Filled 2023-10-17: qty 50

## 2023-10-17 NOTE — Consult Note (Signed)
PHARMACY - ANTICOAGULATION CONSULT NOTE  Pharmacy Consult for heparin infusion Indication: pulmonary embolus  Allergies  Allergen Reactions   Hydrocodone-Acetaminophen Nausea Only   Neuromuscular Blocking Agents Other (See Comments)    Don't work    Patient Measurements: Height: 5\' 2"  (157.5 cm) Weight: 69.9 kg (154 lb) IBW/kg (Calculated) : 50.1 Heparin Dosing Weight: 64.8 kg  Vital Signs: Temp: 98.2 F (36.8 C) (10/18 1641) Temp Source: Oral (10/18 1641) BP: 138/85 (10/18 1641) Pulse Rate: 69 (10/18 1641)  Labs: Recent Labs    10/17/23 0545 10/17/23 0546 10/17/23 0547 10/17/23 1437  HGB  --   --  12.6  --   HCT  --   --  38.6  --   PLT  --   --  331  --   APTT  --  27  --   --   LABPROT  --  12.1  --   --   INR  --  0.9  --   --   HEPARINUNFRC  --   --   --  0.71*  CREATININE 0.87  --   --   --   TROPONINIHS  --  4 4  --     Estimated Creatinine Clearance: 66.1 mL/min (by C-G formula based on SCr of 0.87 mg/dL).   Medical History: Past Medical History:  Diagnosis Date   Anxiety    Brachial neuritis    Cervical cancer (HCC)    Chronic neck pain    Chronic pain not due to malignancy    Depressive disorder    followed by Dr. Mordecai Rasmussen.   Disturbance, sleep    DVT (deep venous thrombosis) (HCC)    left arm   Elevated blood pressure reading without diagnosis of hypertension    Embolism and thrombosis of splenic artery    H/O thrombophlebitis    Infarction of spleen    Lumbosacral neuritis    Obesity, Class II, BMI 35-39.9, with comorbidity     Medications:  No home anticoagulants per pharmacist review  Assessment: 57 yo female presented to ED with chest pain.  Patient has history of DVT not currently on anticoagulation.  Imaging reveal acute PE. Pharmacy consulted to start heparin infusion.  10/18 1437 HL 0.71  Goal of Therapy:  Heparin level 0.3-0.7 units/ml Monitor platelets by anticoagulation protocol: Yes   Plan:  -Decrease Heparin  to 1000 units/hr -Recheck HL in 6 hours -Daily CBC while on Heparin drip  Clovia Cuff, PharmD, BCPS 10/17/2023 4:47 PM

## 2023-10-17 NOTE — ED Notes (Signed)
Informed rn bed assigned 

## 2023-10-17 NOTE — Plan of Care (Signed)

## 2023-10-17 NOTE — Progress Notes (Signed)
Pharmacy Antibiotic Note  Jamie [name] is a 57 y.o. female admitted on 10/17/2023 with  intra-abdominal infection .  Pharmacy has been consulted for Zosyn dosing.  Plan: Zosyn 3.375g IV q8h (4 hour infusion).  Height: 5\' 2"  (157.5 cm) Weight: 69.9 kg (154 lb) IBW/kg (Calculated) : 50.1  Temp (24hrs), Avg:97.6 F (36.4 C), Min:97.6 F (36.4 C), Max:97.6 F (36.4 C)  Recent Labs  Lab 10/17/23 0545 10/17/23 0547  WBC  --  7.6  CREATININE 0.87  --     Estimated Creatinine Clearance: 66.1 mL/min (by C-G formula based on SCr of 0.87 mg/dL).    Allergies  Allergen Reactions   Hydrocodone-Acetaminophen Nausea Only   Neuromuscular Blocking Agents Other (See Comments)    Don't work    Antimicrobials this admission: Zosyn 10/18 >>     Dose adjustments this admission:  Microbiology results:  Thank you for allowing pharmacy to be a part of this patient's care.  Clovia Cuff, PharmD, BCPS 10/17/2023 10:02 AM
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
## 2023-10-17 NOTE — Consult Note (Signed)
PHARMACY -  BRIEF ANTIBIOTIC NOTE   Pharmacy has received consult(s) for  Zosyn dosing from an ED provider.  The patient's profile has been reviewed for ht/wt/allergies/indication/available labs.    One time order(s) placed for Zosyn 3.375 grams IV x 1  Further antibiotics/pharmacy consults should be ordered by admitting physician if indicated.                       Thank you, Barrie Folk, PharmD 10/17/2023  7:34 AM

## 2023-10-17 NOTE — Consult Note (Signed)
PHARMACY - ANTICOAGULATION CONSULT NOTE  Pharmacy Consult for heparin infusion Indication: pulmonary embolus  Allergies  Allergen Reactions   Hydrocodone-Acetaminophen Nausea Only   Neuromuscular Blocking Agents Other (See Comments)    Don't work    Patient Measurements: Height: 5\' 2"  (157.5 cm) Weight: 69.9 kg (154 lb) IBW/kg (Calculated) : 50.1 Heparin Dosing Weight: 64.8 kg  Vital Signs: Temp: 97.6 F (36.4 C) (10/18 0547) Temp Source: Axillary (10/18 0547) BP: 142/85 (10/18 0630) Pulse Rate: 76 (10/18 0630)  Labs: Recent Labs    10/17/23 0545 10/17/23 0547  HGB  --  12.6  HCT  --  38.6  PLT  --  331  CREATININE 0.87  --   TROPONINIHS  --  4    Estimated Creatinine Clearance: 66.1 mL/min (by C-G formula based on SCr of 0.87 mg/dL).   Medical History: Past Medical History:  Diagnosis Date   Anxiety    Brachial neuritis    Cervical cancer (HCC)    Chronic neck pain    Chronic pain not due to malignancy    Depressive disorder    followed by Dr. Mordecai Rasmussen.   Disturbance, sleep    DVT (deep venous thrombosis) (HCC)    left arm   Elevated blood pressure reading without diagnosis of hypertension    Embolism and thrombosis of splenic artery    H/O thrombophlebitis    Infarction of spleen    Lumbosacral neuritis    Obesity, Class II, BMI 35-39.9, with comorbidity     Medications:  No home anticoagulants per pharmacist review  Assessment: 57 yo female presented to ED with chest pain.  Patient has history of DVT not currently on anticoagulation.  Imaging reveal acute PE. Pharmacy consulted to start heparin infusion.  Goal of Therapy:  Heparin level 0.3-0.7 units/ml Monitor platelets by anticoagulation protocol: Yes   Plan:  Give 4000 units bolus x 1 Start heparin infusion at 1050 units/hr Check anti-Xa level in 6 hours and daily while on heparin Continue to monitor H&H and platelets  Barrie Folk 10/17/2023,7:33 AM

## 2023-10-17 NOTE — ED Provider Notes (Addendum)
[facility] Provider Note    Event Date/Time   First MD Initiated Contact with Patient 10/17/23 [phone]     (approximate)   History   Chest Pain   HPI  Jamie [name] is a 57 y.o. female who presents to the ED for evaluation of Chest Pain   Review of video psychiatric visit from 2 months ago.  History of depression and anxiety.  DVT, previously had an IVC filter that was removed in 2022. No abdominal surgical history  She presents to the ED for evaluation of lower chest pain.  She reports lower sternal pain, wrapping around beneath her right breast.  Symptoms just started tonight and seem to wake her from sleep.  She was "normal" when she went to bed last night without pain or discomfort.  No postprandial symptoms last night.  Alongside this pain this morning, she has no accompanying symptoms such as shortness of breath, nausea, emesis, dizziness or syncope.  Physical Exam   Triage Vital Signs: ED Triage Vitals  Encounter Vitals Group     BP --      Systolic BP Percentile --      Diastolic BP Percentile --      Pulse --      Resp --      Temp --      Temp src --      SpO2 --      Weight 10/17/23 0543 154 lb (69.9 kg)     Height 10/17/23 0543 5\' 2"  (1.575 m)     Head Circumference --      Peak Flow --      Pain Score 10/17/23 0545 8     Pain Loc --      Pain Education --      Exclude from Growth Chart --     Most recent vital signs: Vitals:   10/17/23 0547 10/17/23 0630  BP: (!) 147/89 (!) 142/85  Pulse: 70 76  Resp: 20 18  Temp: 97.6 F (36.4 C)   SpO2: 100% 97%    General: Awake, no distress.  Seems uncomfortable CV:  Good peripheral perfusion.  S1 murmur is noted Resp:  Normal effort.  Abd:  No distention.  RUQ tenderness is present, benign lower abdomen MSK:  No deformity noted.  Neuro:  No focal deficits appreciated. Other:     ED Results / Procedures / Treatments   Labs (all labs ordered are listed, but only abnormal  results are displayed) Labs Reviewed  LIPASE, BLOOD - Abnormal; Notable for the following components:      Result Value   Lipase 62 (*)    All other components within normal limits  COMPREHENSIVE METABOLIC PANEL - Abnormal; Notable for the following components:   Glucose, Bld 101 (*)    Calcium 8.7 (*)    AST 98 (*)    All other components within normal limits  D-DIMER, QUANTITATIVE - Abnormal; Notable for the following components:   D-Dimer, Quant 0.72 (*)    All other components within normal limits  CBC  APTT  PROTIME-INR  TYPE AND SCREEN  TROPONIN I (HIGH SENSITIVITY)  TROPONIN I (HIGH SENSITIVITY)    EKG Sinus rhythm with a rate of 76 bpm.  Normal axis.  Right bundle.  No STEMI.  RADIOLOGY RUQ ultrasound interpreted by me with signs of cholecystitis CXR interpreted by me without evidence of acute cardiopulmonary pathology.  Official radiology report(s): CT Angio Chest PE W and/or Wo Contrast  Result Date: 10/17/2023 CLINICAL DATA:  Chest pain with positive D-dimer. Patient states pain is 8/10. EXAM: CT ANGIOGRAPHY CHEST WITH CONTRAST TECHNIQUE: Multidetector CT imaging of the chest was performed using the standard protocol during bolus administration of intravenous contrast. Multiplanar CT image reconstructions and MIPs were obtained to evaluate the vascular anatomy. RADIATION DOSE REDUCTION: This exam was performed according to the departmental dose-optimization program which includes automated exposure control, adjustment of the mA and/or kV according to patient size and/or use of iterative reconstruction technique. CONTRAST:  75mL OMNIPAQUE IOHEXOL 350 MG/ML SOLN COMPARISON:  Portable chest today, chest CT with contrast 09/19/2009. FINDINGS: Cardiovascular: There is acute near occlusive thrombus in the right upper lobe apical segmental artery and with extension into the 2 of its branch arteries, with additional subsegmental thrombus in the posterior segment of the right upper  lobe (series 4 images 45-50; and images 43-45). This represents a small clot burden, without findings of right heart strain, without other visible emboli bilaterally. The pulmonary trunk and main arteries are normal caliber. There is mild cardiomegaly, with a left chamber predominance. There is no pericardial effusion. The pulmonary veins are mildly prominent. There is scattered calcific plaque in the LAD coronary artery only. The aorta and great vessels are unremarkable apart from mild descending aortic tortuosity. No visible plaques. Mediastinum/Nodes: No enlarged mediastinal, hilar, or axillary lymph nodes. Thyroid gland, trachea, and esophagus demonstrate no significant findings. Both main bronchi are patent. Lungs/Pleura: No pleural effusion, thickening or pneumothorax. The bronchi are mildly thickened. Again noted is a mildly elevated right hemidiaphragm. There is scattered linear scarring or atelectasis in the left lower lobe. The lungs are clear of infiltrates and nodules. Upper Abdomen: Single small stone in the posterior gallbladder. Mild hepatic steatosis. Gallbladder is not fully seen. Musculoskeletal: Osteopenia, thoracic kyphosis and multilevel degenerative discs with multilevel bridging enthesopathy. No acute or other significant osseous findings. The ribcage is intact. Review of the MIP images confirms the above findings. IMPRESSION: 1. Acute near occlusive thrombus in the right upper lobe apical segmental artery with extension into the 2 of its branch arteries, and additional subsegmental thrombus in the posterior segment of the right upper lobe. 2. Small overall clot burden with no findings of acute right heart strain. 3. Mild cardiomegaly with left chamber predominance and mildly prominent pulmonary veins. No pulmonary edema or pleural effusion. 4. Bronchitis without evidence of pneumonia. 5. Cholelithiasis. 6. Hepatic steatosis. 7. Osteopenia, kyphosis and degenerative change. 8. Critical  Value/emergent results were called by telephone at the time of interpretation on 10/17/2023 at 7:20 am to provider DR. ISAACS, who verbally acknowledged these results. Electronically Signed   By: Almira Bar M.D.   On: 10/17/2023 07:20   US ABDOMEN LIMITED RUQ (LIVER/GB)  Result Date: 10/17/2023 CLINICAL DATA:  Chest and abdomen pain. [id number] with right upper quadrant abdomen pain. EXAM: ULTRASOUND ABDOMEN LIMITED RIGHT UPPER QUADRANT COMPARISON:  CT with IV contrast 10/12/2009. FINDINGS: Gallbladder: There is sludge and a few tiny layering stones in the gallbladder. The gallbladder is dilated to 12 cm in length with thickening over portions up to 7.9 mm, positive sonographic Murphy's sign, and pericholecystic fluid. Findings are worrisome for acute cholecystitis. Common bile duct: Diameter: 5.1 mm, within normal limits. No intrahepatic biliary prominence. Liver: No focal lesion identified. There is mild increased parenchymal echogenicity consistent with steatosis. Portal vein is patent on color Doppler imaging with normal direction of blood flow towards the liver. Other: None. IMPRESSION: 1. Findings worrisome for acute cholecystitis.  There is sludge and a few tiny stones in the gallbladder. Surgical consult recommended. 2. Hepatic steatosis. Electronically Signed   By: Almira Bar M.D.   On: 10/17/2023 07:03   DG Chest Port 1 View  Result Date: 10/17/2023 CLINICAL DATA:  56 year old female with history of chest pain. EXAM: PORTABLE CHEST 1 VIEW COMPARISON:  No priors. FINDINGS: Lung volumes are normal. No consolidative airspace disease. No pleural effusions. No pneumothorax. No pulmonary nodule or mass noted. Pulmonary vasculature and the cardiomediastinal silhouette are within normal limits. IMPRESSION: No radiographic evidence of acute cardiopulmonary disease. Electronically Signed   By: Trudie Reed M.D.   On: 10/17/2023 06:24    PROCEDURES and INTERVENTIONS:  .1-3 Lead EKG  Interpretation  Performed by: Delton Prairie, MD Authorized by: Delton Prairie, MD     Interpretation: normal     ECG rate:  70   ECG rate assessment: normal     Rhythm: sinus rhythm     Ectopy: none     Conduction: normal   .Critical Care  Performed by: Delton Prairie, MD Authorized by: Delton Prairie, MD   Critical care provider statement:    Critical care time (minutes):  30   Critical care was necessary to treat or prevent imminent or life-threatening deterioration of the following conditions:  Cardiac failure and circulatory failure   Critical care was time spent personally by me on the following activities:  Development of treatment plan with patient or surrogate, discussions with consultants, evaluation of patient's response to treatment, examination of patient, ordering and review of laboratory studies, ordering and review of radiographic studies, ordering and performing treatments and interventions, pulse oximetry, re-evaluation of patient's condition and review of old charts   Medications  piperacillin-tazobactam (ZOSYN) IVPB 3.375 g (has no administration in time range)  HYDROmorphone (DILAUDID) injection 0.5 mg (has no administration in time range)  heparin injection 4,000 Units (has no administration in time range)  fentaNYL (SUBLIMAZE) injection 50 mcg (50 mcg Intravenous Given 10/17/23 0602)  iohexol (OMNIPAQUE) 350 MG/ML injection 75 mL (75 mLs Intravenous Contrast Given 10/17/23 0645)     IMPRESSION / MDM / ASSESSMENT AND PLAN / ED COURSE  I reviewed the triage vital signs and the nursing notes.  Differential diagnosis includes, but is not limited to, ACS, PTX, PNA, muscle strain/spasm, PE, dissection, anxiety, pleural effusion, biliary colic  {Patient presents with symptoms of an acute illness or injury that is potentially life-threatening.  Patient presents to the ED with chest discomfort that I suspect is more epigastric discomfort from cholecystitis.  Rather sudden  onset of pain without any preceding postprandial symptoms or biliary colic prior to the past couple hours with persistent pain.  Generally reassuring LFTs but AST is noted to be marginally elevated.  No leukocytosis or stigmata of sepsis.  Troponin is negative.  Due to her history of DVT, dimer is sent and is returns positive and CTA chest is pending.  Due to her RUQ tenderness on exam, ultrasound obtained and is worrisome for cholecystitis.  Will consult surgery.  Zosyn provided.  Clinical Course as of 10/17/23 0724  Fri Oct 17, 2023  0631 Reassessed. Pain controlled waiting on imaging. Still tender to RUQ. U/s starting. Discussed CTA chest, she's agreeable [DS]  [phone] Updated patient of u/s result and recommendation for surgical eval. She's agreeable [DS]  [id number] Call from radiology regarding CTA chest with a PE.  Update patient of this, starting heparin and antibiotics, consultation with surgery [DS]    Clinical  Course User Index [DS] Delton Prairie, MD     FINAL CLINICAL IMPRESSION(S) / ED DIAGNOSES   Final diagnoses:  Acute cholecystitis     Rx / DC Orders   ED Discharge Orders     None        Note:  This document was prepared using Dragon voice recognition software and may include unintentional dictation errors.   Delton Prairie, MD 10/17/23 Loralie Champagne    Delton Prairie, MD 10/17/23 [phone]

## 2023-10-17 NOTE — ED Notes (Addendum)
Pt transported to CT ?

## 2023-10-17 NOTE — Plan of Care (Signed)
[facility] Tonsillectomy/Adenoidectomy, Postoperative PEDS care plan entered in error.

## 2023-10-17 NOTE — Consult Note (Signed)
SURGICAL CONSULTATION NOTE   HISTORY OF PRESENT ILLNESS (HPI):  57 y.o. female presented to [facility] ED for evaluation of epigastric and chest pain. Patient reports she woke up this morning with epigastric that radiated to the upper chest this morning.  She also endorses that the pain radiates to both upper abdominal quadrants.  Due to intensity of the pain she came to the ED for evaluation.  Patient cannot identify any alleviating or aggravating factors.  She denies any fever.  At the ED she was found with tenderness on palpation in the epigastric area.  Patient with stable vital signs.  No fever, tachycardia.  There was no leukocytosis.  No significant electrolyte disturbance.  Normal bilirubin.  She had an ultrasound of the abdomen that shows cholelithiasis with borderline gallbladder wall thickening.  It was concerning for cholecystitis.  I personally evaluated the images.  She also had a CT angio of the chest finding pulmonary embolism.  Lower extremity ultrasound negative for DVTs.  Surgery is consulted by Dr. Erma Heritage in this context for evaluation and management of cholecystitis.  PAST MEDICAL HISTORY (PMH):  Past Medical History:  Diagnosis Date   Anxiety    Brachial neuritis    Cervical cancer (HCC)    Chronic neck pain    Chronic pain not due to malignancy    Depressive disorder    followed by Dr. Mordecai Rasmussen.   Disturbance, sleep    DVT (deep venous thrombosis) (HCC)    left arm   Elevated blood pressure reading without diagnosis of hypertension    Embolism and thrombosis of splenic artery    H/O thrombophlebitis    Infarction of spleen    Lumbosacral neuritis    Obesity, Class II, BMI 35-39.9, with comorbidity      PAST SURGICAL HISTORY (PSH):  Past Surgical History:  Procedure Laterality Date   CESAREAN SECTION  09/24/2010   IVC FILTER INSERTION N/A 05/24/2021   Procedure: IVC FILTER INSERTION;  Surgeon: Annice Needy, MD;  Location: [facility] INVASIVE CV LAB;  Service:  Cardiovascular;  Laterality: N/A;   IVC FILTER REMOVAL N/A 07/18/2021   Procedure: IVC FILTER REMOVAL;  Surgeon: Annice Needy, MD;  Location: [facility] INVASIVE CV LAB;  Service: Cardiovascular;  Laterality: N/A;   TONSILLECTOMY AND ADENOIDECTOMY  09/24/2010   TOTAL HIP ARTHROPLASTY Right 05/29/2021   Procedure: TOTAL HIP ARTHROPLASTY ANTERIOR APPROACH;  Surgeon: Kennedy Bucker, MD;  Location: [facility] ORS;  Service: Orthopedics;  Laterality: Right;   TUBAL LIGATION  09/24/2010     MEDICATIONS:  Prior to Admission medications   Medication Sig Start Date End Date Taking? Authorizing Provider  amitriptyline (ELAVIL) 100 MG tablet TAKE 1 TABLET BY MOUTH AT BEDTIME 08/14/23  Yes Eappen, Saramma, MD  Brexpiprazole (REXULTI) 3 MG TABS Take 1 tablet (3 mg total) by mouth daily with supper. 01/22/23  Yes Eappen, Levin Bacon, MD  carbamazepine (TEGRETOL) 200 MG tablet TAKE 1 TABLET BY MOUTH IN THE MORNING AND 1 AT BEDTIME 09/15/23  Yes Eappen, Saramma, MD  gabapentin (NEURONTIN) 300 MG capsule Take 300 mg by mouth daily.   Yes [provider]  ibuprofen (ADVIL) 200 MG tablet Take 400 mg by mouth every 6 (six) hours as needed for fever, cramping or mild pain (pain score 1-3).   Yes [provider]  propranolol (INDERAL) 10 MG tablet Take 1 tablet by mouth twice daily as needed for anxiety 10/10/23  Yes Jomarie Longs, MD  BINAXNOW COVID-19 AG HOME TEST KIT  07/31/21  [provider]  meloxicam (MOBIC) 15 MG tablet Take by mouth. Patient not taking: Reported on 05/30/2023 10/24/22 10/24/23  [provider]     ALLERGIES:  Allergies  Allergen Reactions   Hydrocodone-Acetaminophen Nausea Only   Neuromuscular Blocking Agents Other (See Comments)    Don't work     SOCIAL HISTORY:  Social History   Socioeconomic History   Marital status: Married    Spouse name: rodney   Number of children: 3   Years of education: Not on file   Highest education level: Some college, no degree   Occupational History    Comment: full time  Tobacco Use   Smoking status: Never   Smokeless tobacco: Never  Vaping Use   Vaping status: Never Used  Substance and Sexual Activity   Alcohol use: Yes    Alcohol/week: 0.0 standard drinks of alcohol    Comment: social   Drug use: Not Currently    Types: Marijuana   Sexual activity: Yes    Partners: Male    Birth control/protection: Surgical    Comment: Tubal ligation   Other Topics Concern   Not on file  Social History Narrative   Not on file   Social Determinants of Health   Financial Resource Strain: Medium Risk (02/03/2018)   Overall Financial Resource Strain (CARDIA)    Difficulty of Paying Living Expenses: Somewhat hard  Food Insecurity: Food Insecurity Present (02/03/2018)   Hunger Vital Sign    Worried About Running Out of Food in the Last Year: Sometimes true    Ran Out of Food in the Last Year: Sometimes true  Transportation Needs: No Transportation Needs (02/03/2018)   PRAPARE - Administrator, Civil Service (Medical): No    Lack of Transportation (Non-Medical): No  Physical Activity: Inactive (02/03/2018)   Exercise Vital Sign    Days of Exercise per Week: 0 days    Minutes of Exercise per Session: 0 min  Stress: Stress Concern Present (02/03/2018)   [employer] of Occupational Health - Occupational Stress Questionnaire    Feeling of Stress : Very much  Social Connections: Somewhat Isolated (02/03/2018)   Social Connection and Isolation Panel [NHANES]    Frequency of Communication with Friends and Family: More than three times a week    Frequency of Social Gatherings with Friends and Family: Never    Attends Religious Services: Never    Database administrator or Organizations: No    Attends Banker Meetings: Never    Marital Status: Married  Catering manager Violence: Not At Risk (02/03/2018)   Humiliation, Afraid, Rape, and Kick questionnaire    Fear of Current or Ex-Partner: No     Emotionally Abused: No    Physically Abused: No    Sexually Abused: No      FAMILY HISTORY:  Family History  Problem Relation Age of Onset   Anxiety disorder Mother    Depression Mother    Alcohol abuse Brother    Depression Daughter      REVIEW OF SYSTEMS:  Constitutional: denies weight loss, fever, chills, or sweats  Eyes: denies any other vision changes, history of eye injury  ENT: denies sore throat, hearing problems  Respiratory: denies shortness of breath, wheezing  Cardiovascular: Positive chest pain Gastrointestinal: Positive abdominal pain, nausea and vomiting Genitourinary: denies burning with urination or urinary frequency Musculoskeletal: denies any other joint pains or cramps  Skin: denies any other rashes or skin discolorations  Neurological: denies any  other headache, dizziness, weakness  Psychiatric: denies any other depression, anxiety   All other review of systems were negative   VITAL SIGNS:  Temp:  [97.6 F (36.4 C)] 97.6 F (36.4 C) (10/18 0547) Pulse Rate:  [70-80] 78 (10/18 1200) Resp:  [11-20] 11 (10/18 1200) BP: (123-147)/(73-90) 137/87 (10/18 1200) SpO2:  [97 %-100 %] 97 % (10/18 1200) Weight:  [69.9 kg] 69.9 kg (10/18 0543)     Height: 5\' 2"  (157.5 cm) Weight: 69.9 kg BMI (Calculated): 28.16   INTAKE/OUTPUT:  This shift: No intake/output data recorded.  Last 2 shifts: @IOLAST2SHIFTS @   PHYSICAL EXAM:  Constitutional:  -- Normal body habitus  -- Awake, alert, and oriented x3  Eyes:  -- Pupils equally round and reactive to light  -- No scleral icterus  Ear, nose, and throat:  -- No jugular venous distension  Pulmonary:  -- No crackles  -- Equal breath sounds bilaterally -- Breathing non-labored at rest Cardiovascular:  -- S1, S2 present  -- No pericardial rubs Gastrointestinal:  -- Abdomen soft, tender in the right upper quadrant, non-distended, no guarding or rebound tenderness -- No abdominal masses appreciated, pulsatile or  otherwise  Musculoskeletal and Integumentary:  -- Wounds: None appreciated -- Extremities: B/L UE and LE FROM, hands and feet warm, no edema  Neurologic:  -- Motor function: intact and symmetric -- Sensation: intact and symmetric   Labs:     Latest Ref Rng & Units 10/17/2023    5:47 AM 05/30/2021    4:36 AM 05/21/2021   10:21 AM  CBC  WBC 4.0 - 10.5 K/uL 7.6  15.3  6.1   Hemoglobin 12.0 - 15.0 g/dL 78.2  95.6  21.3   Hematocrit 36.0 - 46.0 % 38.6  33.7  41.9   Platelets 150 - 400 K/uL 331  265  309       Latest Ref Rng & Units 10/17/2023    5:45 AM 05/30/2021    4:36 AM 05/21/2021   10:21 AM  CMP  Glucose 70 - 99 mg/dL 086  578  74   BUN 6 - 20 mg/dL 19  16  19    Creatinine 0.44 - 1.00 mg/dL 4.69  6.29  5.28   Sodium 135 - 145 mmol/L 143  137  139   Potassium 3.5 - 5.1 mmol/L 4.0  3.6  3.5   Chloride 98 - 111 mmol/L 106  105  102   CO2 22 - 32 mmol/L 29  24  28    Calcium 8.9 - 10.3 mg/dL 8.7  8.6  9.0   Total Protein 6.5 - 8.1 g/dL 7.0   7.5   Total Bilirubin 0.3 - 1.2 mg/dL 0.4   0.7   Alkaline Phos 38 - 126 U/L 100   78   AST 15 - 41 U/L 98   22   ALT 0 - 44 U/L 42   15     Imaging studies:  EXAM: CT ANGIOGRAPHY CHEST WITH CONTRAST   TECHNIQUE: Multidetector CT imaging of the chest was performed using the standard protocol during bolus administration of intravenous contrast. Multiplanar CT image reconstructions and MIPs were obtained to evaluate the vascular anatomy.   RADIATION DOSE REDUCTION: This exam was performed according to the departmental dose-optimization program which includes automated exposure control, adjustment of the mA and/or kV according to patient size and/or use of iterative reconstruction technique.   CONTRAST:  75mL OMNIPAQUE IOHEXOL 350 MG/ML SOLN   COMPARISON:  Portable chest today, chest CT  with contrast 09/19/2009.   FINDINGS: Cardiovascular: There is acute near occlusive thrombus in the right upper lobe apical segmental artery and  with extension into the 2 of its branch arteries, with additional subsegmental thrombus in the posterior segment of the right upper lobe (series 4 images 45-50; and images 43-45).   This represents a small clot burden, without findings of right heart strain, without other visible emboli bilaterally. The pulmonary trunk and main arteries are normal caliber.   There is mild cardiomegaly, with a left chamber predominance. There is no pericardial effusion.   The pulmonary veins are mildly prominent. There is scattered calcific plaque in the LAD coronary artery only.   The aorta and great vessels are unremarkable apart from mild descending aortic tortuosity. No visible plaques.   Mediastinum/Nodes: No enlarged mediastinal, hilar, or axillary lymph nodes. Thyroid gland, trachea, and esophagus demonstrate no significant findings. Both main bronchi are patent.   Lungs/Pleura: No pleural effusion, thickening or pneumothorax. The bronchi are mildly thickened. Again noted is a mildly elevated right hemidiaphragm.   There is scattered linear scarring or atelectasis in the left lower lobe. The lungs are clear of infiltrates and nodules.   Upper Abdomen: Single small stone in the posterior gallbladder. Mild hepatic steatosis. Gallbladder is not fully seen.   Musculoskeletal: Osteopenia, thoracic kyphosis and multilevel degenerative discs with multilevel bridging enthesopathy. No acute or other significant osseous findings. The ribcage is intact.   Review of the MIP images confirms the above findings.   IMPRESSION: 1. Acute near occlusive thrombus in the right upper lobe apical segmental artery with extension into the 2 of its branch arteries, and additional subsegmental thrombus in the posterior segment of the right upper lobe. 2. Small overall clot burden with no findings of acute right heart strain. 3. Mild cardiomegaly with left chamber predominance and mildly prominent pulmonary  veins. No pulmonary edema or pleural effusion. 4. Bronchitis without evidence of pneumonia. 5. Cholelithiasis. 6. Hepatic steatosis. 7. Osteopenia, kyphosis and degenerative change. 8. Critical Value/emergent results were called by telephone at the time of interpretation on 10/17/2023 at 7:20 am to provider DR. ISAACS, who verbally acknowledged these results.     Electronically Signed   By: Almira Bar M.D.   On: 10/17/2023 07:20  EXAM: ULTRASOUND ABDOMEN LIMITED RIGHT UPPER QUADRANT   COMPARISON:  CT with IV contrast 10/12/2009.   FINDINGS: Gallbladder:   There is sludge and a few tiny layering stones in the gallbladder. The gallbladder is dilated to 12 cm in length with thickening over portions up to 7.9 mm, positive sonographic Murphy's sign, and pericholecystic fluid. Findings are worrisome for acute cholecystitis.   Common bile duct:   Diameter: 5.1 mm, within normal limits. No intrahepatic biliary prominence.   Liver:   No focal lesion identified. There is mild increased parenchymal echogenicity consistent with steatosis. Portal vein is patent on color Doppler imaging with normal direction of blood flow towards the liver.   Other: None.   IMPRESSION: 1. Findings worrisome for acute cholecystitis. There is sludge and a few tiny stones in the gallbladder. Surgical consult recommended. 2. Hepatic steatosis.     Electronically Signed   By: Almira Bar M.D.   On: 10/17/2023 07:03  Assessment/Plan:  57 y.o. female with acute cholecystitis, complicated by pertinent comorbidities including acute PE.  Acute cholecystitis -History, physical exam and imaging consistent with acute cholecystitis.  Due to the new finding of acute PE of unknown etiology patient is high risk to proceed  with cholecystectomy at this time.  Agree with treatment with bowel rest, antibiotic therapy, pain management.  I discussed with patient that if she does not respond to medical  therapy she will need to proceed with the percutaneous cholecystostomy. -Will continue to follow  Acute recurrent PE Patient with history of splenic infarct in the past, previous DVT.  Upper notices a recurrent PE.  Most likely patient has hypercoagulable state unrecognized -Will defer any sort of intervention until she has complete workup and stabilized from the PE standpoint. -Continue management as per primary team  Gae Gallop, MD

## 2023-10-17 NOTE — Progress Notes (Signed)
*  PRELIMINARY RESULTS* Echocardiogram 2D Echocardiogram has been performed.  Jamie [name] 10/17/2023, 3:38 PM

## 2023-10-17 NOTE — ED Triage Notes (Signed)
Pt presents ambulatory to triage via POV with complaints of mid-sternal CP that started this AM. Rates the pain 8/10 - tried pepto without improvement. A&Ox4 at this time. Denies SOB.

## 2023-10-17 NOTE — ED Notes (Signed)
Pt reported increased chest pain after ambulating to the bathroom, EKG uploaded and Dr. Chipper Herb notified. No new orders at this time.

## 2023-10-18 ENCOUNTER — Inpatient Hospital Stay: Payer: 59

## 2023-10-18 DIAGNOSIS — K810 Acute cholecystitis: Secondary | ICD-10-CM

## 2023-10-18 DIAGNOSIS — I2699 Other pulmonary embolism without acute cor pulmonale: Secondary | ICD-10-CM

## 2023-10-18 LAB — CBC
HCT: 37.7 % (ref 36.0–46.0)
Hemoglobin: 12.2 g/dL (ref 12.0–15.0)
MCH: 30.3 pg (ref 26.0–34.0)
MCHC: 32.4 g/dL (ref 30.0–36.0)
MCV: 93.8 fL (ref 80.0–100.0)
Platelets: 244 10*3/uL (ref 150–400)
RBC: 4.02 MIL/uL (ref 3.87–5.11)
RDW: 12.9 % (ref 11.5–15.5)
WBC: 6.5 10*3/uL (ref 4.0–10.5)
nRBC: 0 % (ref 0.0–0.2)

## 2023-10-18 LAB — COMPREHENSIVE METABOLIC PANEL
ALT: 111 U/L — ABNORMAL HIGH (ref 0–44)
AST: 116 U/L — ABNORMAL HIGH (ref 15–41)
Albumin: 3.3 g/dL — ABNORMAL LOW (ref 3.5–5.0)
Alkaline Phosphatase: 117 U/L (ref 38–126)
Anion gap: 7 (ref 5–15)
BUN: 12 mg/dL (ref 6–20)
CO2: 25 mmol/L (ref 22–32)
Calcium: 8 mg/dL — ABNORMAL LOW (ref 8.9–10.3)
Chloride: 106 mmol/L (ref 98–111)
Creatinine, Ser: 0.56 mg/dL (ref 0.44–1.00)
GFR, Estimated: >60 mL/min (ref >60)
Glucose, Bld: 91 mg/dL (ref 70–99)
Potassium: 3.7 mmol/L (ref 3.5–5.1)
Sodium: 138 mmol/L (ref 135–145)
Total Bilirubin: 0.5 mg/dL (ref 0.3–1.2)
Total Protein: 6.2 g/dL — ABNORMAL LOW (ref 6.5–8.1)

## 2023-10-18 LAB — HEPARIN LEVEL (UNFRACTIONATED): Heparin Unfractionated: 0.39 [IU]/mL (ref 0.30–0.70)

## 2023-10-18 MED ORDER — PROCHLORPERAZINE EDISYLATE 10 MG/2ML IJ SOLN
10.0000 mg | Freq: Four times a day (QID) | INTRAMUSCULAR | Status: DC | PRN
  Administered 2023-10-18 (×2): 10 mg via INTRAVENOUS
  Filled 2023-10-18 (×2): qty 2

## 2023-10-18 MED ORDER — PANTOPRAZOLE SODIUM 40 MG IV SOLR
40.0000 mg | INTRAVENOUS | Status: DC
  Administered 2023-10-18: 40 mg via INTRAVENOUS
  Filled 2023-10-18: qty 10

## 2023-10-18 NOTE — Progress Notes (Signed)
  Progress Note   Patient: Jamie [name] NFA:[id number] DOB: [birth year] DOA: 10/17/2023     1 DOS: the patient was seen and examined on 10/18/2023   Brief hospital course: HPI on admission 10/17/23: Jamie [name] is a 57 y.o. female with medical history significant of remote history of splenic infarction during pregnancy, left arm DVT, anxiety/depression, presented with new onset of chest pain and abdominal pain.  See H&P for full detailed HPI on admission and ED course.  Patient was found to have multiple Pe's and acute cholecystitic.  She was admitted for further evaluation and management.  Started on IV heparin   Consults: General surgery    Assessment and Plan:  Acute recurrent PE Highly suspect unprovoked etiology --Recommend follow-up with [facility] hematology for hypercoagulable state workup. --Continue IV heparin for now in setting of acute cholecystitis.  Transition to PO when appropriate, await definitive plan for any procedures or surgery  --DVT study negative --Echo pending   Acute cholecystitis --General surgery following --Due to acute Pe's conservative management recommended --NPO for bowel rest, sip w meds --IV fluids until taking PO's --Follow pending MRCP   Anxiety/depression - stable -- continue SSRI      Subjective: Pt seen holding emesis bag today and actively vomiting.   Did not improve with Zofran.  Added Compazine.  Has ongoing RUQ pain.    Physical Exam: Vitals:   10/17/23 2123 10/18/23 0243 10/18/23 0754 10/18/23 1740  BP: 129/75 102/70 117/77 129/76  Pulse: 73 76 76 84  Resp: 16 17 16 16   Temp: 98.1 F (36.7 C) 98.1 F (36.7 C) 98 F (36.7 C) 98.6 F (37 C)  TempSrc:  Oral Oral Oral  SpO2: 95% 95% 95% 97%  Weight:      Height:       General exam: awake, alert, no acute distress, ill appearing HEENT: anicteric sclera, moist mucus membranes, hearing grossly normal  Respiratory system: CTAB, no wheezes, rales or rhonchi, normal  respiratory effort. Cardiovascular system: normal S1/S2, RRR, no pedal edema.   Gastrointestinal system: soft, non-distended, RUQ tenderness Central nervous system: A&O x 3. no gross focal neurologic deficits, normal speech Extremities: moves all, no edema, normal tone Skin: dry, intact, normal temperature Psychiatry: normal mood, congruent affect, judgement and insight appear normal   Data Reviewed:  Notable labs --  Ca 8.0, albumin 3.3, AST 116, ALT 111, Tprotein 6.2.  Normal CBC  Echo --- pending  MRCP -- pending  Family Communication: None  Disposition: Status is: Inpatient Remains inpatient appropriate because: severity of illness as above    Planned Discharge Destination: Home    Time spent: 45 minutes  Author: Pennie Banter, DO 10/18/2023 7:44 PM  For on call review [email or web address].

## 2023-10-18 NOTE — Progress Notes (Signed)
Patient ID: Chrystie Nose, female   DOB: 1966/05/13, 57 y.o.   MRN: [id number]     SURGICAL PROGRESS NOTE   Hospital Day(s): 1.   Interval History: Patient seen and examined, no acute events or new complaints overnight. Patient reports she has significant abdominal pain overnight.  Some improving of the abdominal pain this morning.  But still 8 out of 10.  She has increased liver enzyme this morning.  Vital signs in last 24 hours: [min-max] current  Temp:  [97.8 F (36.6 C)-98.2 F (36.8 C)] 98 F (36.7 C) (10/19 0754) Pulse Rate:  [67-80] 76 (10/19 0754) Resp:  [11-17] 16 (10/19 0754) BP: (102-141)/(70-90) 117/77 (10/19 0754) SpO2:  [95 %-99 %] 95 % (10/19 0754)     Height: 5\' 2"  (157.5 cm) Weight: 69.9 kg BMI (Calculated): 28.16   Physical Exam:  Constitutional: alert, cooperative and no distress  Respiratory: breathing non-labored at rest  Cardiovascular: regular rate and sinus rhythm  Gastrointestinal: soft, tender, and non-distended  Labs:     Latest Ref Rng & Units 10/18/2023    4:11 AM 10/17/2023    5:47 AM 05/30/2021    4:36 AM  CBC  WBC 4.0 - 10.5 K/uL 6.5  7.6  15.3   Hemoglobin 12.0 - 15.0 g/dL 08.6  57.8  46.9   Hematocrit 36.0 - 46.0 % 37.7  38.6  33.7   Platelets 150 - 400 K/uL 244  331  265       Latest Ref Rng & Units 10/18/2023    4:11 AM 10/17/2023    5:45 AM 05/30/2021    4:36 AM  CMP  Glucose 70 - 99 mg/dL 91  629  528   BUN 6 - 20 mg/dL 12  19  16    Creatinine 0.44 - 1.00 mg/dL 4.13  2.44  0.10   Sodium 135 - 145 mmol/L 138  143  137   Potassium 3.5 - 5.1 mmol/L 3.7  4.0  3.6   Chloride 98 - 111 mmol/L 106  106  105   CO2 22 - 32 mmol/L 25  29  24    Calcium 8.9 - 10.3 mg/dL 8.0  8.7  8.6   Total Protein 6.5 - 8.1 g/dL 6.2  7.0    Total Bilirubin 0.3 - 1.2 mg/dL 0.5  0.4    Alkaline Phos 38 - 126 U/L 117  100    AST 15 - 41 U/L 116  98    ALT 0 - 44 U/L 111  42      Imaging studies: No new pertinent imaging studies   Assessment/Plan:  57  y.o. female with cholecystitis, complicated by pertinent comorbidities including acute PE.  -There was worsening abdominal pain overnight.  Mild improvement this morning. -Worsening liver enzymes -Will proceed with MRCP to rule out choledocholithiasis -Will continue with IV antibiotic therapy and bowel rest -If no improvement of abdominal pain in the next 24 hours we will need to consider cholecystostomy for treatment of cholecystitis -Continue heparin drip for anticoagulation in case patient needs any procedure -Discussed with patient that I will not recommend surgical intervention during this admission due to acute PE.  Patient reports she understand  Gae Gallop, MD

## 2023-10-18 NOTE — Consult Note (Signed)
PHARMACY - ANTICOAGULATION CONSULT NOTE  Pharmacy Consult for heparin infusion Indication: pulmonary embolus  Allergies  Allergen Reactions   Hydrocodone-Acetaminophen Nausea Only   Neuromuscular Blocking Agents Other (See Comments)    Don't work    Patient Measurements: Height: 5\' 2"  (157.5 cm) Weight: 69.9 kg (154 lb) IBW/kg (Calculated) : 50.1 Heparin Dosing Weight: 64.8 kg  Vital Signs: Temp: 98.1 F (36.7 C) (10/18 2123) Temp Source: Oral (10/18 1941) BP: 129/75 (10/18 2123) Pulse Rate: 73 (10/18 2123)  Labs: Recent Labs    10/17/23 0545 10/17/23 0546 10/17/23 0547 10/17/23 1437 10/17/23 2250  HGB  --   --  12.6  --   --   HCT  --   --  38.6  --   --   PLT  --   --  331  --   --   APTT  --  27  --   --   --   LABPROT  --  12.1  --   --   --   INR  --  0.9  --   --   --   HEPARINUNFRC  --   --   --  0.71* 0.45  CREATININE 0.87  --   --   --   --   TROPONINIHS  --  4 4  --   --     Estimated Creatinine Clearance: 66.1 mL/min (by C-G formula based on SCr of 0.87 mg/dL).   Medical History: Past Medical History:  Diagnosis Date   Anxiety    Brachial neuritis    Cervical cancer (HCC)    Chronic neck pain    Chronic pain not due to malignancy    Depressive disorder    followed by Dr. Mordecai Rasmussen.   Disturbance, sleep    DVT (deep venous thrombosis) (HCC)    left arm   Elevated blood pressure reading without diagnosis of hypertension    Embolism and thrombosis of splenic artery    H/O thrombophlebitis    Infarction of spleen    Lumbosacral neuritis    Obesity, Class II, BMI 35-39.9, with comorbidity     Medications:  No home anticoagulants per pharmacist review  Assessment: 57 yo female presented to ED with chest pain.  Patient has history of DVT not currently on anticoagulation.  Imaging reveal acute PE. Pharmacy consulted to start heparin infusion.  10/18 1437 HL 0.71  Goal of Therapy:  Heparin level 0.3-0.7 units/ml Monitor platelets by  anticoagulation protocol: Yes   Plan:  -Continue heparin infusion at 1000 units/hr -Recheck HL w/ AM labs to confirm -Daily CBC while on Heparin drip  Otelia Sergeant, PharmD, [facility] 10/18/2023 12:03 AM

## 2023-10-18 NOTE — Consult Note (Signed)
PHARMACY - ANTICOAGULATION CONSULT NOTE  Pharmacy Consult for heparin infusion Indication: pulmonary embolus  Allergies  Allergen Reactions   Hydrocodone-Acetaminophen Nausea Only   Neuromuscular Blocking Agents Other (See Comments)    Don't work    Patient Measurements: Height: 5\' 2"  (157.5 cm) Weight: 69.9 kg (154 lb) IBW/kg (Calculated) : 50.1 Heparin Dosing Weight: 64.8 kg  Vital Signs: Temp: 98.1 F (36.7 C) (10/19 0243) Temp Source: Oral (10/19 0243) BP: 102/70 (10/19 0243) Pulse Rate: 76 (10/19 0243)  Labs: Recent Labs    10/17/23 0545 10/17/23 0546 10/17/23 0547 10/17/23 1437 10/17/23 2250 10/18/23 0411  HGB  --   --  12.6  --   --  12.2  HCT  --   --  38.6  --   --  37.7  PLT  --   --  331  --   --  244  APTT  --  27  --   --   --   --   LABPROT  --  12.1  --   --   --   --   INR  --  0.9  --   --   --   --   HEPARINUNFRC  --   --   --  0.71* 0.45 0.39  CREATININE 0.87  --   --   --   --  0.56  TROPONINIHS  --  4 4  --   --   --     Estimated Creatinine Clearance: 71.9 mL/min (by C-G formula based on SCr of 0.56 mg/dL).   Medical History: Past Medical History:  Diagnosis Date   Anxiety    Brachial neuritis    Cervical cancer (HCC)    Chronic neck pain    Chronic pain not due to malignancy    Depressive disorder    followed by Dr. Mordecai Rasmussen.   Disturbance, sleep    DVT (deep venous thrombosis) (HCC)    left arm   Elevated blood pressure reading without diagnosis of hypertension    Embolism and thrombosis of splenic artery    H/O thrombophlebitis    Infarction of spleen    Lumbosacral neuritis    Obesity, Class II, BMI 35-39.9, with comorbidity     Medications:  No home anticoagulants per pharmacist review  Assessment: 57 yo female presented to ED with chest pain.  Patient has history of DVT not currently on anticoagulation.  Imaging reveal acute PE. Pharmacy consulted to start heparin infusion.  10/18 1437 HL 0.71 1018 2250 HL  0.45 1019 0411 HL 0.39.   Goal of Therapy:  Heparin level 0.3-0.7 units/ml Monitor platelets by anticoagulation protocol: Yes   Plan:  Heparin level is therapeutic. Will continue heparin at 1000 units/hr. Recheck heparin level and CBC with AM labs.   Paschal Dopp, PharmD, 10/18/2023 5:23 AM

## 2023-10-19 DIAGNOSIS — K810 Acute cholecystitis: Secondary | ICD-10-CM | POA: Diagnosis not present

## 2023-10-19 DIAGNOSIS — I2699 Other pulmonary embolism without acute cor pulmonale: Secondary | ICD-10-CM | POA: Diagnosis not present

## 2023-10-19 LAB — CBC
HCT: 37.3 % (ref 36.0–46.0)
Hemoglobin: 12.2 g/dL (ref 12.0–15.0)
MCH: 30.4 pg (ref 26.0–34.0)
MCHC: 32.7 g/dL (ref 30.0–36.0)
MCV: 93 fL (ref 80.0–100.0)
Platelets: 270 10*3/uL (ref 150–400)
RBC: 4.01 MIL/uL (ref 3.87–5.11)
RDW: 12.2 % (ref 11.5–15.5)
WBC: 6.7 10*3/uL (ref 4.0–10.5)
nRBC: 0 % (ref 0.0–0.2)

## 2023-10-19 LAB — COMPREHENSIVE METABOLIC PANEL
ALT: 75 U/L — ABNORMAL HIGH (ref 0–44)
AST: 60 U/L — ABNORMAL HIGH (ref 15–41)
Albumin: 3.3 g/dL — ABNORMAL LOW (ref 3.5–5.0)
Alkaline Phosphatase: 109 U/L (ref 38–126)
Anion gap: 11 (ref 5–15)
BUN: 19 mg/dL (ref 6–20)
CO2: 23 mmol/L (ref 22–32)
Calcium: 8.2 mg/dL — ABNORMAL LOW (ref 8.9–10.3)
Chloride: 102 mmol/L (ref 98–111)
Creatinine, Ser: 0.67 mg/dL (ref 0.44–1.00)
GFR, Estimated: >60 mL/min (ref >60)
Glucose, Bld: 64 mg/dL — ABNORMAL LOW (ref 70–99)
Potassium: 3.4 mmol/L — ABNORMAL LOW (ref 3.5–5.1)
Sodium: 136 mmol/L (ref 135–145)
Total Bilirubin: 1 mg/dL (ref 0.3–1.2)
Total Protein: 6.3 g/dL — ABNORMAL LOW (ref 6.5–8.1)

## 2023-10-19 LAB — HEPARIN LEVEL (UNFRACTIONATED): Heparin Unfractionated: 0.49 [IU]/mL (ref 0.30–0.70)

## 2023-10-19 MED ORDER — ENOXAPARIN (LOVENOX) PATIENT EDUCATION KIT
PACK | Freq: Once | Status: DC
  Filled 2023-10-19: qty 1

## 2023-10-19 MED ORDER — ENOXAPARIN SODIUM 80 MG/0.8ML IJ SOSY
1.0000 mg/kg | PREFILLED_SYRINGE | Freq: Two times a day (BID) | INTRAMUSCULAR | Status: DC
  Administered 2023-10-19 – 2023-10-20 (×3): 70 mg via SUBCUTANEOUS
  Filled 2023-10-19 (×3): qty 0.7

## 2023-10-19 MED ORDER — PANTOPRAZOLE SODIUM 40 MG PO TBEC
40.0000 mg | DELAYED_RELEASE_TABLET | Freq: Every day | ORAL | Status: DC
  Administered 2023-10-19 – 2023-10-21 (×3): 40 mg via ORAL
  Filled 2023-10-19 (×3): qty 1

## 2023-10-19 NOTE — Progress Notes (Signed)
PHARMACIST - PHYSICIAN COMMUNICATION  CONCERNING: IV to Oral Route Change Policy  RECOMMENDATION: This patient is receiving pantoprazole by the intravenous route.  Based on criteria approved by the Pharmacy and Therapeutics Committee, the intravenous medication(s) is/are being converted to the equivalent oral dose form(s).  DESCRIPTION: These criteria include: The patient is eating (either orally or via tube) and/or has been taking other orally administered medications for a least 24 hours The patient has no evidence of active gastrointestinal bleeding or impaired GI absorption (gastrectomy, short bowel, patient on TNA or NPO).  If you have questions about this conversion, please contact the Pharmacy Department   Tressie Ellis, [facility] 10/19/2023 11:33 AM

## 2023-10-19 NOTE — Consult Note (Signed)
PHARMACY - ANTICOAGULATION CONSULT NOTE  Pharmacy Consult for heparin infusion Indication: pulmonary embolus  Allergies  Allergen Reactions   Hydrocodone-Acetaminophen Nausea Only   Neuromuscular Blocking Agents Other (See Comments)    Don't work    Patient Measurements: Height: 5\' 2"  (157.5 cm) Weight: 69.9 kg (154 lb) IBW/kg (Calculated) : 50.1 Heparin Dosing Weight: 64.8 kg  Vital Signs: Temp: 97.5 F (36.4 C) (10/20 0355) Temp Source: Oral (10/19 2026) BP: 113/77 (10/20 0355) Pulse Rate: 72 (10/20 0355)  Labs: Recent Labs     0000 10/17/23 0545 10/17/23 0546 10/17/23 0547 10/17/23 1437 10/17/23 2250 10/18/23 0411 10/19/23 0411  HGB   < >  --   --  12.6  --   --  12.2 12.2  HCT  --   --   --  38.6  --   --  37.7 37.3  PLT  --   --   --  331  --   --  244 270  APTT  --   --  27  --   --   --   --   --   LABPROT  --   --  12.1  --   --   --   --   --   INR  --   --  0.9  --   --   --   --   --   HEPARINUNFRC  --   --   --   --    < > 0.45 0.39 0.49  CREATININE  --  0.87  --   --   --   --  0.56  --   TROPONINIHS  --   --  4 4  --   --   --   --    < > = values in this interval not displayed.    Estimated Creatinine Clearance: 71.9 mL/min (by C-G formula based on SCr of 0.56 mg/dL).   Medical History: Past Medical History:  Diagnosis Date   Anxiety    Brachial neuritis    Cervical cancer (HCC)    Chronic neck pain    Chronic pain not due to malignancy    Depressive disorder    followed by Dr. Mordecai Rasmussen.   Disturbance, sleep    DVT (deep venous thrombosis) (HCC)    left arm   Elevated blood pressure reading without diagnosis of hypertension    Embolism and thrombosis of splenic artery    H/O thrombophlebitis    Infarction of spleen    Lumbosacral neuritis    Obesity, Class II, BMI 35-39.9, with comorbidity     Medications:  No home anticoagulants per pharmacist review  Assessment: 57 yo female presented to ED with chest pain.  Patient has  history of DVT not currently on anticoagulation.  Imaging reveal acute PE. Pharmacy consulted to start heparin infusion.  10/18  1437 HL 0.71 10/18 2250 HL 0.45 10/19 0411 HL 0.39 10/20 0411 HL 0.49  Goal of Therapy:  Heparin level 0.3-0.7 units/ml Monitor platelets by anticoagulation protocol: Yes   Plan:  Heparin level is therapeutic. Will continue heparin at 1000 units/hr. Recheck heparin level and CBC with AM labs.   Bettey Costa, PharmD Clinical Pharmacist 10/19/2023 5:19 AM

## 2023-10-19 NOTE — Progress Notes (Signed)
  Progress Note   Patient: Jamie [name] ZOX:[id number] DOB: 1966/10/02 DOA: 10/17/2023     2 DOS: the patient was seen and examined on 10/19/2023   Brief hospital course: HPI on admission 10/17/23: Jamie [name] is a 57 y.o. female with medical history significant of remote history of splenic infarction during pregnancy, left arm DVT, anxiety/depression, presented with new onset of chest pain and abdominal pain.  See H&P for full detailed HPI on admission and ED course.  Patient was found to have multiple Pe's and acute cholecystitis.  She was admitted for further evaluation and management.  Started on IV heparin, IV antibiotics and IV fluids.  Kept NPO for further evaluation.  Further hospital course and management as outlined below.  Consults: General surgery    Assessment and Plan:  Acute recurrent PE Highly suspect unprovoked etiology --Recommend follow-up with [facility] hematology for hypercoagulable state workup. --Continue IV heparin for now in setting of acute cholecystitis.  Transition to PO when appropriate, await definitive plan for any procedures or surgery  --DVT study negative --Echo findings as below, no right heart strain  --Outpatient hematology referral for hypercoagulable work up  Acute cholecystitis --General surgery following --Due to acute Pe's conservative management recommended --Now on clear liquids. Diet per surgery. --Off IV fluids, used while NPO --MRCP findings as below   Anxiety/depression - stable -- continue SSRI      Subjective: Pt feeling much better today.  N/V resolved. Tolerating clear liquids.  Pain much improved as well.  No SOB or CP.    Physical Exam: Vitals:   10/18/23 1740 10/18/23 2026 10/19/23 0355 10/19/23 0915  BP: 129/76 137/76 113/77 123/77  Pulse: 84 82 72 82  Resp: 16 16 20 16   Temp: 98.6 F (37 C) 97.8 F (36.6 C) (!) 97.5 F (36.4 C) 98.1 F (36.7 C)  TempSrc: Oral Oral  Oral  SpO2: 97% 96% 98% 97%  Weight:       Height:       General exam: awake, alert, no acute distress HEENT: anicteric sclera, moist mucus membranes, hearing grossly normal  Respiratory system: CTAB, normal respiratory effort. On room air Cardiovascular system: normal S1/S2, RRR, no pedal edema.   Gastrointestinal system: soft, non-distended, non-tender Central nervous system: A&O x 3. no gross focal neurologic deficits, normal speech Extremities: moves all, no edema, normal tone Skin: dry, intact, normal temperature Psychiatry: normal mood, congruent affect, judgement and insight appear normal   Data Reviewed:  Notable labs --  Ca 8.0, albumin 3.3, AST 116, ALT 111, Tprotein 6.2.  Normal CBC  Echo 10/17/2023 --- EF 60-65%, mild MR, normal RV size & function  MRCP 10/18/2023 -- small amount biliary sludge and/or tiny gallstones, no overt signs of acute cholecystitis, no choledocholithiasis or biliary tract obstruction.  Family Communication: None  Disposition: Status is: Inpatient Remains inpatient appropriate because: severity of illness as above    Planned Discharge Destination: Home    Time spent: 42 minutes  Author: Pennie Banter, DO 10/19/2023 6:55 PM  For on call review [email or web address].

## 2023-10-19 NOTE — Progress Notes (Signed)
Patient ID: Jamie [name], female   DOB: 08-23-1966, 57 y.o.   MRN: [id number]     SURGICAL PROGRESS NOTE   Hospital Day(s): 2.   Interval History: Patient seen and examined, no acute events or new complaints overnight. Patient reports feeling better today.  She endorses significant improvement of the abdominal pain.  She denies any nausea or vomiting.  No paralyzation.  No alleviating or aggravating factors.  MRCP yesterday shows no choledocholithiasis.  Minimal inflammation of the gallbladder.  I personally evaluated the images.  Vital signs in last 24 hours: [min-max] current  Temp:  [97.5 F (36.4 C)-98.6 F (37 C)] 97.5 F (36.4 C) (10/20 0355) Pulse Rate:  [72-84] 72 (10/20 0355) Resp:  [16-20] 20 (10/20 0355) BP: (113-137)/(76-77) 113/77 (10/20 0355) SpO2:  [96 %-98 %] 98 % (10/20 0355)     Height: 5\' 2"  (157.5 cm) Weight: 69.9 kg BMI (Calculated): 28.16   Physical Exam:  Constitutional: alert, cooperative and no distress  Respiratory: breathing non-labored at rest  Cardiovascular: regular rate and sinus rhythm  Gastrointestinal: soft, non-tender, and non-distended  Labs:     Latest Ref Rng & Units 10/19/2023    4:11 AM 10/18/2023    4:11 AM 10/17/2023    5:47 AM  CBC  WBC 4.0 - 10.5 K/uL 6.7  6.5  7.6   Hemoglobin 12.0 - 15.0 g/dL 40.9  81.1  91.4   Hematocrit 36.0 - 46.0 % 37.3  37.7  38.6   Platelets 150 - 400 K/uL 270  244  331       Latest Ref Rng & Units 10/19/2023    4:11 AM 10/18/2023    4:11 AM 10/17/2023    5:45 AM  CMP  Glucose 70 - 99 mg/dL 64  91  782   BUN 6 - 20 mg/dL 19  12  19    Creatinine 0.44 - 1.00 mg/dL 9.56  2.13  0.86   Sodium 135 - 145 mmol/L 136  138  143   Potassium 3.5 - 5.1 mmol/L 3.4  3.7  4.0   Chloride 98 - 111 mmol/L 102  106  106   CO2 22 - 32 mmol/L 23  25  29    Calcium 8.9 - 10.3 mg/dL 8.2  8.0  8.7   Total Protein 6.5 - 8.1 g/dL 6.3  6.2  7.0   Total Bilirubin 0.3 - 1.2 mg/dL 1.0  0.5  0.4   Alkaline Phos 38 - 126 U/L 109   117  100   AST 15 - 41 U/L 60  116  98   ALT 0 - 44 U/L 75  111  42    Assessment/Plan:  57 y.o. female with cholecystitis, complicated by pertinent comorbidities including acute PE.   -Patient doing much better today.  Abdominal pain has improved. -Will start clear liquid diet -Continue antibiotic therapy -May consider transition to Lovenox therapeutic dose, which may be easier to manage down the heparin drip.  If patient continue progressing as she is doing today, we can consider transition to oral anticoagulation in the next 24 to 48 hours. -Encourage patient to get out of bed and ambulate  Gae Gallop, MD

## 2023-10-20 ENCOUNTER — Other Ambulatory Visit (HOSPITAL_COMMUNITY): Payer: Self-pay

## 2023-10-20 DIAGNOSIS — I2699 Other pulmonary embolism without acute cor pulmonale: Secondary | ICD-10-CM | POA: Diagnosis not present

## 2023-10-20 DIAGNOSIS — K810 Acute cholecystitis: Secondary | ICD-10-CM | POA: Diagnosis not present

## 2023-10-20 LAB — COMPREHENSIVE METABOLIC PANEL
ALT: 57 U/L — ABNORMAL HIGH (ref 0–44)
AST: 42 U/L — ABNORMAL HIGH (ref 15–41)
Albumin: 3.2 g/dL — ABNORMAL LOW (ref 3.5–5.0)
Alkaline Phosphatase: 98 U/L (ref 38–126)
Anion gap: 9 (ref 5–15)
BUN: 13 mg/dL (ref 6–20)
CO2: 26 mmol/L (ref 22–32)
Calcium: 8.4 mg/dL — ABNORMAL LOW (ref 8.9–10.3)
Chloride: 101 mmol/L (ref 98–111)
Creatinine, Ser: 0.72 mg/dL (ref 0.44–1.00)
GFR, Estimated: >60 mL/min (ref >60)
Glucose, Bld: 103 mg/dL — ABNORMAL HIGH (ref 70–99)
Potassium: 3.4 mmol/L — ABNORMAL LOW (ref 3.5–5.1)
Sodium: 136 mmol/L (ref 135–145)
Total Bilirubin: 0.6 mg/dL (ref 0.3–1.2)
Total Protein: 6 g/dL — ABNORMAL LOW (ref 6.5–8.1)

## 2023-10-20 LAB — MAGNESIUM: Magnesium: 1.8 mg/dL (ref 1.7–2.4)

## 2023-10-20 LAB — CBC
HCT: 34.3 % — ABNORMAL LOW (ref 36.0–46.0)
Hemoglobin: 11.4 g/dL — ABNORMAL LOW (ref 12.0–15.0)
MCH: 30.4 pg (ref 26.0–34.0)
MCHC: 33.2 g/dL (ref 30.0–36.0)
MCV: 91.5 fL (ref 80.0–100.0)
Platelets: 282 10*3/uL (ref 150–400)
RBC: 3.75 MIL/uL — ABNORMAL LOW (ref 3.87–5.11)
RDW: 12.4 % (ref 11.5–15.5)
WBC: 6.2 10*3/uL (ref 4.0–10.5)
nRBC: 0 % (ref 0.0–0.2)

## 2023-10-20 MED ORDER — POLYETHYLENE GLYCOL 3350 17 G PO PACK
17.0000 g | PACK | Freq: Every day | ORAL | Status: DC
  Administered 2023-10-20 – 2023-10-21 (×2): 17 g via ORAL
  Filled 2023-10-20 (×2): qty 1

## 2023-10-20 MED ORDER — ENOXAPARIN SODIUM 80 MG/0.8ML IJ SOSY
1.0000 mg/kg | PREFILLED_SYRINGE | Freq: Two times a day (BID) | INTRAMUSCULAR | Status: DC
Start: 2023-10-20 — End: 2023-10-21
  Administered 2023-10-20 – 2023-10-21 (×2): 70 mg via SUBCUTANEOUS
  Filled 2023-10-20 (×3): qty 0.7

## 2023-10-20 MED ORDER — SENNOSIDES-DOCUSATE SODIUM 8.6-50 MG PO TABS
1.0000 | ORAL_TABLET | Freq: Two times a day (BID) | ORAL | Status: DC
  Administered 2023-10-20 – 2023-10-21 (×3): 1 via ORAL
  Filled 2023-10-20 (×3): qty 1

## 2023-10-20 MED ORDER — BUTALBITAL-APAP-CAFFEINE 50-325-40 MG PO TABS
1.0000 | ORAL_TABLET | Freq: Four times a day (QID) | ORAL | Status: DC | PRN
  Administered 2023-10-20 – 2023-10-21 (×3): 1 via ORAL
  Filled 2023-10-20 (×3): qty 1

## 2023-10-20 MED ORDER — APIXABAN 5 MG PO TABS: 10.0000 mg | ORAL_TABLET | Freq: Two times a day (BID) | ORAL | Status: DC

## 2023-10-20 MED ORDER — QUETIAPINE FUMARATE 25 MG PO TABS: 25.0000 mg | ORAL_TABLET | Freq: Every day | ORAL | Status: DC

## 2023-10-20 MED ORDER — OXYCODONE HCL 5 MG PO TABS
5.0000 mg | ORAL_TABLET | ORAL | Status: DC | PRN
  Administered 2023-10-20 (×2): 10 mg via ORAL
  Administered 2023-10-21: 5 mg via ORAL
  Administered 2023-10-21 (×2): 10 mg via ORAL
  Filled 2023-10-20 (×5): qty 2

## 2023-10-20 MED ORDER — OXYCODONE HCL 5 MG PO TABS
5.0000 mg | ORAL_TABLET | Freq: Four times a day (QID) | ORAL | Status: DC | PRN
  Administered 2023-10-20: 10 mg via ORAL
  Filled 2023-10-20: qty 2

## 2023-10-20 MED ORDER — APIXABAN 5 MG PO TABS: 5.0000 mg | ORAL_TABLET | Freq: Two times a day (BID) | ORAL | Status: DC

## 2023-10-20 MED ORDER — MAGNESIUM HYDROXIDE 400 MG/5ML PO SUSP: 15.0000 mL | Freq: Every day | ORAL | Status: DC | PRN

## 2023-10-20 MED ORDER — BISACODYL 5 MG PO TBEC
5.0000 mg | DELAYED_RELEASE_TABLET | Freq: Every day | ORAL | Status: DC | PRN
  Administered 2023-10-21: 5 mg via ORAL
  Filled 2023-10-20: qty 1

## 2023-10-20 NOTE — TOC Benefit Eligibility Note (Addendum)
Patient Product/process development scientist completed.    The patient is insured through [employer]. Patient has [employer], may use a copay card, and/or apply for patient assistance if available.    Ran test claim for Eliquis 5 mg and the current 30 day co-pay is $150.00 due to a $100.00 deductible.  Will be $50.00 once deductible is met.   This test claim was processed through [employer]- copay amounts may vary at other pharmacies due to [employer], or as the patient moves through the different stages of their insurance plan.     Roland Earl, CPHT Pharmacy Technician III Certified Patient Advocate [facility] Patient Advocate Team Direct Number: [phone]  Fax: [phone]

## 2023-10-20 NOTE — Progress Notes (Signed)
Patient ID: Jamie [name], female   DOB: September 01, 1966, 57 y.o.   MRN: [id number]     SURGICAL PROGRESS NOTE   Hospital Day(s): 3.   Interval History: Patient seen and examined, no acute events or new complaints overnight. Patient reports feeling better today.  She denies any specific abdominal pain.  She was having headache.  She think that is related to not eating well and stress.  History of migraines.  She was able to tolerate clear liquid diet without abdominal pain.  No pain radiation.  No alleviating or aggravating factors.  Denies any fever.  Vital signs in last 24 hours: [min-max] current  Temp:  [97.9 F (36.6 C)-98.4 F (36.9 C)] 98.4 F (36.9 C) (10/21 1100) Pulse Rate:  [68-81] 81 (10/21 1100) Resp:  [17-20] 18 (10/21 1100) BP: (106-118)/(59-74) 118/72 (10/21 1100) SpO2:  [96 %-97 %] 97 % (10/21 0800)     Height: 5\' 2"  (157.5 cm) Weight: 69.9 kg BMI (Calculated): 28.16   Physical Exam:  Constitutional: alert, cooperative and no distress  Respiratory: breathing non-labored at rest  Cardiovascular: regular rate and sinus rhythm  Gastrointestinal: soft, non-tender, and non-distended  Labs:     Latest Ref Rng & Units 10/20/2023    4:07 AM 10/19/2023    4:11 AM 10/18/2023    4:11 AM  CBC  WBC 4.0 - 10.5 K/uL 6.2  6.7  6.5   Hemoglobin 12.0 - 15.0 g/dL 86.5  78.4  69.6   Hematocrit 36.0 - 46.0 % 34.3  37.3  37.7   Platelets 150 - 400 K/uL 282  270  244       Latest Ref Rng & Units 10/20/2023    4:07 AM 10/19/2023    4:11 AM 10/18/2023    4:11 AM  CMP  Glucose 70 - 99 mg/dL 295  64  91   BUN 6 - 20 mg/dL 13  19  12    Creatinine 0.44 - 1.00 mg/dL 2.84  1.32  4.40   Sodium 135 - 145 mmol/L 136  136  138   Potassium 3.5 - 5.1 mmol/L 3.4  3.4  3.7   Chloride 98 - 111 mmol/L 101  102  106   CO2 22 - 32 mmol/L 26  23  25    Calcium 8.9 - 10.3 mg/dL 8.4  8.2  8.0   Total Protein 6.5 - 8.1 g/dL 6.0  6.3  6.2   Total Bilirubin 0.3 - 1.2 mg/dL 0.6  1.0  0.5   Alkaline Phos  38 - 126 U/L 98  109  117   AST 15 - 41 U/L 42  60  116   ALT 0 - 44 U/L 57  75  111     Imaging studies: No new pertinent imaging studies   Assessment/Plan:  57 y.o. female with cholecystitis, complicated by pertinent comorbidities including acute PE.   -Patient continue to respond adequately to medical therapy for cholecystitis -She responding well to antibiotic therapy -Continue with abdominal pain. -She tolerated a clear liquid diet.  Will advance to full liquids. -Diet may be advanced as tolerated. -If patient tolerates full liquid diet today and soft diet tomorrow she can be transition to oral antibiotic therapy and oral anticoagulation -Possible discharge within the next 24 to 48 hours -Patient will need outpatient follow-up with hematology for further workup of hypercoagulable state  Gae Gallop, MD

## 2023-10-20 NOTE — Plan of Care (Signed)

## 2023-10-20 NOTE — Consult Note (Signed)
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 and MIPs were obtained to evaluate the vascular anatomy. RADIATION DOSE REDUCTION: This exam was performed according to the departmental dose-optimization program which includes automated exposure control, adjustment of the mA and/or kV according to patient size and/or use of iterative reconstruction technique. CONTRAST:  75mL OMNIPAQUE IOHEXOL 350 MG/ML SOLN COMPARISON:  Portable chest today, chest CT with contrast 09/19/2009. FINDINGS: Cardiovascular: There is acute near occlusive thrombus in the right upper lobe apical segmental artery and with extension into the 2 of its branch arteries, with additional subsegmental thrombus in the posterior segment of the right upper lobe (series 4 images 45-50; and images 43-45). This represents a small clot burden, without findings of right heart strain, without other visible emboli bilaterally. The pulmonary trunk and main arteries are normal caliber. There is mild cardiomegaly, with a left chamber predominance. There is no pericardial  effusion. The pulmonary veins are mildly prominent. There is scattered calcific plaque in the LAD coronary artery only. The aorta and great vessels are unremarkable apart from mild descending aortic tortuosity. No visible plaques. Mediastinum/Nodes: No enlarged mediastinal, hilar, or axillary lymph nodes. Thyroid gland, trachea, and esophagus demonstrate no significant findings. Both main bronchi are patent. Lungs/Pleura: No pleural effusion, thickening or pneumothorax. The bronchi are mildly thickened. Again noted is a mildly elevated right hemidiaphragm. There is scattered linear scarring or atelectasis in the left lower lobe. The lungs are clear of infiltrates and nodules. Upper Abdomen: Single small stone in the posterior gallbladder. Mild hepatic steatosis. Gallbladder is not fully seen. Musculoskeletal: Osteopenia, thoracic kyphosis and multilevel degenerative discs with multilevel bridging enthesopathy. No acute or other significant osseous findings. The ribcage is intact. Review of the MIP images confirms the above findings. IMPRESSION: 1. Acute near occlusive thrombus in the right upper lobe apical segmental artery with extension into the 2 of its branch arteries, and additional subsegmental thrombus in the posterior segment of the right upper lobe. 2. Small overall clot burden with no findings of acute right heart strain. 3. Mild cardiomegaly with left chamber predominance and mildly prominent pulmonary veins. No pulmonary edema or pleural effusion. 4. Bronchitis without evidence of pneumonia. 5. Cholelithiasis. 6. Hepatic steatosis. 7. Osteopenia, kyphosis and degenerative change. 8. Critical Value/emergent results were called by telephone at the time of interpretation on 10/17/2023 at 7:20 am to provider DR. ISAACS, who verbally acknowledged these results. Electronically Signed   By: Almira Bar M.D.   On: 10/17/2023 07:20   US ABDOMEN LIMITED RUQ (LIVER/GB)  Result Date: 10/17/2023 CLINICAL  DATA:  Chest and abdomen pain. 284132 with right upper quadrant abdomen pain. EXAM: ULTRASOUND ABDOMEN LIMITED RIGHT UPPER QUADRANT COMPARISON:  CT with IV contrast 10/12/2009. FINDINGS: Gallbladder: There is sludge and a  few tiny layering stones in the gallbladder. The gallbladder is dilated to 12 cm in length with thickening over portions up to 7.9 mm, positive sonographic Murphy's sign, and pericholecystic fluid. Findings are worrisome for acute cholecystitis. Common bile duct: Diameter: 5.1 mm, within normal limits. No intrahepatic biliary prominence. Liver: No focal lesion identified. There is mild increased parenchymal echogenicity consistent with steatosis. Portal vein is patent on color Doppler imaging with normal direction of blood flow towards the liver. Other: None. IMPRESSION: 1. Findings worrisome for acute cholecystitis. There is sludge and a few tiny stones in the gallbladder. Surgical consult recommended. 2. Hepatic steatosis. Electronically Signed   By: Almira Bar M.D.   On: 10/17/2023 07:03   DG Chest Port 1 View  Result Date: 10/17/2023 CLINICAL DATA:  57 year old female with history of chest pain. EXAM: PORTABLE CHEST 1 VIEW COMPARISON:  No priors. FINDINGS: Lung volumes are normal. No consolidative airspace disease. No pleural effusions. No pneumothorax. No pulmonary nodule or mass noted. Pulmonary vasculature and the cardiomediastinal silhouette are within normal limits. IMPRESSION: No radiographic evidence of acute cardiopulmonary disease. Electronically Signed   By: Trudie Reed M.D.   On: 10/17/2023 06:24     Assessment and plan-   # Acute pulmonary embolism, likely unprovoked given that she has chest pain/shortness of breath first prior to abdominal pain.  No other immobility triggers. Agree with heparin drip. Given that carbamazepine interacts with all DOAC's, I recommend patient to be switched to Lovenox bridging to Coumadin with INR goal of 2-3. I will hold off  hypercoagulable workup during acute settings.  Will consider outpatient workup. Recommend long-term anticoagulation given recurrent episodes. Recommend patient to discuss with psychiatry outpatient to see if she may be able to switch to alternative antidepressants so that she maybe a candidate for NOACs.  # Acute cholecystitis, surgery recommendation was reviewed.  Continue IV antibiotics/conservative management.  Follow-up with surgery.  Thank you for allowing me to participate in the care of this patient.   Rickard Patience, MD, PhD Hematology Oncology 10/20/2023

## 2023-10-20 NOTE — TOC CM/SW Note (Signed)
Transition of Care [facility]) - Inpatient Brief Assessment   Patient Details  Name: Jamie [name] MRN: [id number] Date of Birth: May 29, 1966  Transition of Care [facility]) CM/SW Contact:    Chapman Fitch, RN Phone Number: 10/20/2023, 11:44 AM   Clinical Narrative:   Transition of Care [facility]) Screening Note   Patient Details  Name: Jamie [name] Date of Birth: 01-Apr-1966   Transition of Care [facility]) CM/SW Contact:    Chapman Fitch, RN Phone Number: 10/20/2023, 11:44 AM    Transition of Care Department [facility]) has reviewed patient and no TOC needs have been identified at this time.  If new patient transition needs arise, please place a TOC consult.    Transition of Care Asessment: Insurance and Status: Insurance coverage has been reviewed Patient has primary care physician: Yes     Prior/Current Home Services: No current home services Social Determinants of Health Reivew: SDOH reviewed no interventions necessary Readmission risk has been reviewed: Yes Transition of care needs: no transition of care needs at this time

## 2023-10-20 NOTE — Progress Notes (Addendum)
  Progress Note   Patient: Jamie [name] RKY:[id number] DOB: Feb 11, 1966 DOA: 10/17/2023     3 DOS: the patient was seen and examined on 10/20/2023   Brief hospital course: HPI on admission 10/17/23: Jamie [name] is a 57 y.o. female with medical history significant of remote history of splenic infarction during pregnancy, left arm DVT, anxiety/depression, presented with new onset of chest pain and abdominal pain.  See H&P for full detailed HPI on admission and ED course.  Patient was found to have multiple Pe's and acute cholecystitis.  She was admitted for further evaluation and management.  Started on IV heparin, IV antibiotics and IV fluids.  Kept NPO for further evaluation.  Further hospital course and management as outlined below.  Consults: General surgery Hematology    Assessment and Plan:  Acute recurrent PE Highly suspect unprovoked etiology --Recommend follow-up with [facility] hematology for hypercoagulable state workup. --Initially on IV heparin  >> full dose Lovenox --DOAC's contraindicated with Tegretol, continue Lovenox for now --DVT study negative --Echo findings as below, no right heart strain  --Hematology consulted for anticoagulation given recurrent PE and drug interaction  Acute cholecystitis --General surgery following --Due to acute Pe's conservative management recommended --Diet advanced clear >> full liquids. Diet per surgery. --Off IV fluids, used while NPO --On Zosyn, discharge on Augmenin --MRCP findings as below --Pain control PRN --Bowel regimen --Mobilize frequently   Anxiety/depression - stable -- continue home meds: Rexulti, Elavil, Tegretol -- home propranolol PRN      Subjective: Pt seen with husband visiting today.  She reports feeling better.  No N/V. Diet advanced to full liquids.  Intermitted RUQ pain but controlled with medications.  No other acute complaints.   Physical Exam: Vitals:   10/20/23 0323 10/20/23 0728 10/20/23 0800  10/20/23 1100  BP: (!) 108/59 106/70 114/74 118/72  Pulse: 68 73 71 81  Resp: 20 18 17 18   Temp: 98.2 F (36.8 C) 98.3 F (36.8 C) 97.9 F (36.6 C) 98.4 F (36.9 C)  TempSrc: Oral Oral Oral Oral  SpO2: 97% 96% 97%   Weight:      Height:       General exam: awake, alert, no acute distress HEENT: anicteric sclera, moist mucus membranes, hearing grossly normal  Respiratory system: Normal respiratory effort. On room air Cardiovascular system: normal S1/S2, RRR, no pedal edema.   Gastrointestinal system: soft, non-distended, non-tender Central nervous system: A&O x 3. no gross focal neurologic deficits, normal speech Extremities: moves all, no edema, normal tone Skin: dry, intact, normal temperature Psychiatry: normal mood, congruent affect, judgement and insight appear normal   Data Reviewed:  Notable labs --  K 3.4, glucose 103, Ca 8.4, albumin 3.2,  AST 116 >> 60 >> 42 ALT 111 >> 75 >> 57,  Hbg 11.4   Echo 10/17/2023 --- EF 60-65%, mild MR, normal RV size & function  MRCP 10/18/2023 -- small amount biliary sludge and/or tiny gallstones, no overt signs of acute cholecystitis, no choledocholithiasis or biliary tract obstruction.  Family Communication: husband at bedtime on rounds  Disposition: Status is: Inpatient Remains inpatient appropriate because: severity of illness as above    Planned Discharge Destination: Home    Time spent: 42 minutes  Author: Pennie Banter, DO 10/20/2023 2:27 PM  For on call review [email or web address].

## 2023-10-21 ENCOUNTER — Other Ambulatory Visit (HOSPITAL_COMMUNITY): Payer: Self-pay

## 2023-10-21 ENCOUNTER — Telehealth: Payer: Self-pay

## 2023-10-21 DIAGNOSIS — I2699 Other pulmonary embolism without acute cor pulmonale: Secondary | ICD-10-CM | POA: Diagnosis not present

## 2023-10-21 DIAGNOSIS — K810 Acute cholecystitis: Secondary | ICD-10-CM | POA: Diagnosis not present

## 2023-10-21 LAB — BPAM RBC
Blood Product Expiration Date: 202411042359
Blood Product Expiration Date: 202411042359
Unit Type and Rh: 600
Unit Type and Rh: 600

## 2023-10-21 LAB — BASIC METABOLIC PANEL
Anion gap: 8 (ref 5–15)
BUN: 10 mg/dL (ref 6–20)
CO2: 29 mmol/L (ref 22–32)
Calcium: 8.6 mg/dL — ABNORMAL LOW (ref 8.9–10.3)
Chloride: 102 mmol/L (ref 98–111)
Creatinine, Ser: 0.65 mg/dL (ref 0.44–1.00)
GFR, Estimated: >60 mL/min (ref >60)
Glucose, Bld: 82 mg/dL (ref 70–99)
Potassium: 3.4 mmol/L — ABNORMAL LOW (ref 3.5–5.1)
Sodium: 139 mmol/L (ref 135–145)

## 2023-10-21 LAB — TYPE AND SCREEN
ABO/RH(D): A NEG
Antibody Screen: NEGATIVE
Unit division: 0
Unit division: 0

## 2023-10-21 LAB — PROTIME-INR
INR: 1 (ref 0.8–1.2)
Prothrombin Time: 13.4 s (ref 11.4–15.2)

## 2023-10-21 MED ORDER — BUTALBITAL-APAP-CAFFEINE 50-325-40 MG PO TABS: 1.0000 | ORAL_TABLET | Freq: Four times a day (QID) | ORAL | 0 refills | Status: AC | PRN

## 2023-10-21 MED ORDER — WARFARIN SODIUM 7.5 MG PO TABS
7.5000 mg | ORAL_TABLET | Freq: Once | ORAL | Status: AC
  Administered 2023-10-21: 7.5 mg via ORAL
  Filled 2023-10-21: qty 1

## 2023-10-21 MED ORDER — WARFARIN SODIUM 7.5 MG PO TABS
7.5000 mg | ORAL_TABLET | Freq: Once | ORAL | Status: DC
  Filled 2023-10-21: qty 1

## 2023-10-21 MED ORDER — ACETAMINOPHEN 325 MG PO TABS: 650.0000 mg | ORAL_TABLET | Freq: Four times a day (QID) | ORAL | Status: AC | PRN

## 2023-10-21 MED ORDER — POTASSIUM CHLORIDE CRYS ER 20 MEQ PO TBCR
40.0000 meq | EXTENDED_RELEASE_TABLET | ORAL | Status: AC
Start: 2023-10-21 — End: 2023-10-21
  Administered 2023-10-21 (×2): 40 meq via ORAL
  Filled 2023-10-21: qty 2

## 2023-10-21 MED ORDER — ONDANSETRON HCL 4 MG PO TABS: 4.0000 mg | ORAL_TABLET | Freq: Four times a day (QID) | ORAL | 0 refills | Status: AC | PRN

## 2023-10-21 MED ORDER — ENOXAPARIN SODIUM 80 MG/0.8ML IJ SOSY: 1.0000 mg/kg | PREFILLED_SYRINGE | Freq: Two times a day (BID) | INTRAMUSCULAR | 0 refills | Status: DC

## 2023-10-21 MED ORDER — AMOXICILLIN-POT CLAVULANATE 875-125 MG PO TABS: 1.0000 | ORAL_TABLET | Freq: Two times a day (BID) | ORAL | 0 refills | Status: DC

## 2023-10-21 MED ORDER — POLYETHYLENE GLYCOL 3350 17 G PO PACK: 17.0000 g | PACK | Freq: Every day | ORAL | 0 refills | Status: AC

## 2023-10-21 MED ORDER — WARFARIN SODIUM 5 MG PO TABS: 5.0000 mg | ORAL_TABLET | Freq: Every day | ORAL | 0 refills | Status: DC

## 2023-10-21 MED ORDER — OXYCODONE HCL 5 MG PO TABS: 5.0000 mg | ORAL_TABLET | ORAL | 0 refills | Status: DC | PRN

## 2023-10-21 MED ORDER — WARFARIN SODIUM 2.5 MG PO TABS: 2.5000 mg | ORAL_TABLET | Freq: Once | ORAL | 0 refills | Status: DC

## 2023-10-21 MED ORDER — WARFARIN - PHARMACIST DOSING INPATIENT: Freq: Every day | Status: DC

## 2023-10-21 NOTE — TOC Benefit Eligibility Note (Signed)
Patient Product/process development scientist completed.    The patient is insured through [employer]. Patient has [employer], may use a copay card, and/or apply for patient assistance if available.    Ran test claim for enoxaparin (Lovenox) 80 mg/0.8 ml inj and the current 7 day co-pay is $15.00.   This test claim was processed through [employer]- copay amounts may vary at other pharmacies due to [employer], or as the patient moves through the different stages of their insurance plan.     Roland Earl, CPHT Pharmacy Technician III Certified Patient Advocate [facility] Patient Advocate Team Direct Number: [phone]  Fax: [phone]

## 2023-10-21 NOTE — Discharge Summary (Signed)
Physician Discharge Summary   Patient: Jamie [name] MRN: [id number] DOB: 06-22-1966  Admit date:     10/17/2023  Discharge date: 10/21/2023  Discharge Physician: Pennie Banter   PCP: Mick Sell, MD   Recommendations at discharge:    Follow up with Hematology Frequent INR monitoring while bridging Lovenox >> Coumadin Follow up with General Surgery for cholecystitis Follow up with Primary Care Monitor CBC, BMP at follow ups  Discharge Diagnoses: Principal Problem:   Pulmonary emboli (HCC) Active Problems:   Acute pulmonary embolism (HCC)   Acute cholecystitis  Resolved Problems:   * No resolved hospital problems. [facility] Course:  HPI on admission 10/17/23: Jamie [name] is a 57 y.o. female with medical history significant of remote history of splenic infarction during pregnancy, left arm DVT, anxiety/depression, presented with new onset of chest pain and abdominal pain.  See H&P for full detailed HPI on admission and ED course.   Patient was found to have multiple Pe's and acute cholecystitis.  She was admitted for further evaluation and management.  Started on IV heparin, IV antibiotics and IV fluids.  Kept NPO for further evaluation.   Further hospital course and management as outlined below.   Consults: General surgery Hematology   10/22 --- pt doing well this AM, tolerating advancement of diet and some intermittent RUQ pain controlled on oral medication.  Pt is medically stable and agreeable for d/c home today. She reports being comfortable with Lovenox injections to bridge to warfarin, with close hematology follow up.     Assessment and Plan:  Acute recurrent PE Highly suspect unprovoked etiology --Recommend follow-up with [facility] hematology for hypercoagulable state workup. --Initially on IV heparin  >> full dose Lovenox --DOAC's contraindicated with Tegretol, continue Lovenox for now --DVT study negative --Echo findings as below, no right  heart strain  --Hematology consulted for anticoagulation given recurrent PE and drug interaction -- recommend full dose Lovenox - bridge to warfarin.  --Hematology office to arrange close follow up   Acute cholecystitis --General surgery following -- outpatient follow up --Due to acute Pe's conservative management recommended --Diet advanced clear >> full liquids >> soft diet.  --Off IV fluids, given while NPO --Treated with IV Zosyn >> discharge on Augmenin --MRCP findings as below --Pain control PRN --Bowel regimen --Mobilize frequently   Anxiety/depression - stable -- continue home meds: Rexulti, Elavil, Tegretol -- home propranolol PRN       Consultants: Surgery, Hematology  Procedures performed: none   Disposition: Home  Diet recommendation:  Soft / low fat diet  DISCHARGE MEDICATION: Allergies as of 10/21/2023       Reactions   Hydrocodone-acetaminophen Nausea Only   Neuromuscular Blocking Agents Other (See Comments)   Don't work        Medication List     TAKE these medications    acetaminophen 325 MG tablet Commonly known as: TYLENOL Take 2 tablets (650 mg total) by mouth every 6 (six) hours as needed for mild pain (pain score 1-3), headache or fever.   amitriptyline 100 MG tablet Commonly known as: ELAVIL TAKE 1 TABLET BY MOUTH AT BEDTIME   butalbital-acetaminophen-caffeine 50-325-40 MG tablet Commonly known as: FIORICET Take 1 tablet by mouth every 6 (six) hours as needed (headache refractory to tylenol).   enoxaparin 80 MG/0.8ML injection Commonly known as: LOVENOX Inject 0.7 mLs (70 mg total) into the skin every 12 (twelve) hours for 14 days.   gabapentin 300 MG capsule Commonly known as: NEURONTIN  Take 300 mg by mouth daily.   ondansetron 4 MG tablet Commonly known as: ZOFRAN Take 1 tablet (4 mg total) by mouth every 6 (six) hours as needed for nausea.   oxyCODONE 5 MG immediate release tablet Commonly known as: Oxy  IR/ROXICODONE Take 1-2 tablets (5-10 mg total) by mouth every 4 (four) hours as needed for moderate pain (pain score 4-6) or severe pain (pain score 7-10).   polyethylene glycol 17 g packet Commonly known as: MIRALAX / GLYCOLAX Take 17 g by mouth daily.   Rexulti 3 MG Tabs Generic drug: Brexpiprazole Take 1 tablet (3 mg total) by mouth daily with supper.   warfarin 5 MG tablet Commonly known as: COUMADIN Take 1 tablet (5 mg total) by mouth daily for 20 days.        Follow-up Information     Mick Sell, MD. Schedule an appointment as soon as possible for a visit.   Specialty: Infectious Diseases Why: Hospital follow up in 1-2 weeks Contact information: [address] [place] [address] [phone]         Rickard Patience, MD. Schedule an appointment as soon as possible for a visit.   Specialty: Oncology Why: Needs INR check on Thursday or Friday, and follow up with Dr. Cathie Hoops. Can to make your follow up appointment. Contact information: [address] [place] [address] [phone]         Carolan Shiver, MD. Call.   Specialty: General Surgery Why: Call to schedule follow up for cholecystitis if having ongoing symptoms after antibiotics are completed Contact information: [address] [place] [address] [phone]                Discharge Exam: Filed Weights   10/17/23 0543  Weight: 69.9 kg   General exam: awake, alert, no acute distress HEENT: atraumatic, clear conjunctiva, anicteric sclera, moist mucus membranes, hearing grossly normal  Respiratory system: CTAB, no wheezes, rales or rhonchi, normal respiratory effort. Cardiovascular system: normal S1/S2, RRR, no JVD, murmurs, rubs, gallops, no pedal edema.   Gastrointestinal system: soft, NT, ND, no HSM felt, +bowel sounds. Central nervous system: A&O x 3. no gross focal neurologic deficits, normal speech Extremities: moves all, no edema, normal tone Skin:  dry, intact, normal temperature, normal color, No rashes, lesions or ulcers Psychiatry: normal mood, congruent affect, judgement and insight appear normal   Condition at discharge: stable  The results of significant diagnostics from this hospitalization (including imaging, microbiology, ancillary and laboratory) are listed below for reference.   Imaging Studies: NM Hepatobiliary Liver Func  Result Date: 10/27/2023 CLINICAL DATA:  Right upper quadrant pain EXAM: NUCLEAR MEDICINE HEPATOBILIARY IMAGING TECHNIQUE: Sequential images of the abdomen were obtained out to 60 minutes following intravenous administration of radiopharmaceutical. RADIOPHARMACEUTICALS:  4.84 mCi Tc-43m  Choletec IV COMPARISON:  None Available. FINDINGS: Prompt uptake and biliary excretion of activity by the liver is seen. Gallbladder activity is visualized, consistent with patency of cystic duct. Biliary activity passes into small bowel, consistent with patent common bile duct. IMPRESSION: No common duct or cystic duct obstruction Electronically Signed   By: Karen Kays M.D.   On: 10/27/2023 15:18   CT ABDOMEN PELVIS W CONTRAST  Result Date: 10/26/2023 CLINICAL DATA:  Abdominal pain, nonlocalized. Recent admission for pulmonary embolism. Complains of persistent right upper quadrant abdominal pain with associated nausea. EXAM: CT ABDOMEN AND PELVIS WITH CONTRAST TECHNIQUE: Multidetector CT imaging of the abdomen and pelvis was performed using the standard protocol  following bolus administration of intravenous contrast. RADIATION DOSE REDUCTION: This exam was performed according to the departmental dose-optimization program which includes automated exposure control, adjustment of the mA and/or kV according to patient size and/or use of iterative reconstruction technique. CONTRAST:  OMNIPAQUE IOHEXOL 300 MG/ML  SOLN COMPARISON:  MRI abdomen 10/18/2023 FINDINGS: Lower chest: Scarring noted within the left lower lobe. No  pleural effusion or consolidative change. Hepatobiliary: Several small, subcentimeter low-attenuation liver lesions identified within both lobes of liver. Technically too small to characterize but favored to represent small cysts. These appear similar to the previous MRI. No suspicious focal liver abnormality. Gallbladder appears normal. No bile duct dilatation. Pancreas: Unremarkable. No pancreatic ductal dilatation or surrounding inflammatory changes. Spleen: Unchanged appearance of the normal size spleen with adjacent accessory spleen. Adrenals/Urinary Tract: Adrenal glands appear normal. Bilateral, subcentimeter kidney cysts are technically too small to reliably characterize but appear unchanged from previous exam. These are compatible with Bosniak class 2 cyst. No follow-up imaging recommended. No nephrolithiasis or obstructive uropathy. The bladder is normal. Stomach/Bowel: Stomach is normal. The appendix is visualized and appears normal. No pathologic dilatation of the large or small bowel loops. No bowel wall thickening or inflammatory changes identified. Vascular/Lymphatic: Normal appearance of the abdominal aorta. The upper abdominal vascularity is patent. No signs of abdominopelvic adenopathy. Reproductive: Uterus and the adnexal structures are unremarkable. Other: No ascites or focal fluid collection. No signs of pneumoperitoneum. Musculoskeletal: Right hip arthroplasty. No acute or suspicious osseous findings. IMPRESSION: 1. No acute findings within the abdomen or pelvis. No change from previous imaging. Electronically Signed   By: Signa Kell M.D.   On: 10/26/2023 17:06   DG Chest 2 View  Result Date: 10/26/2023 CLINICAL DATA:  Chest pain and tightness with pain under rib cage. Recent new diagnosis of acute pulmonary embolism. EXAM: CHEST - 2 VIEW COMPARISON:  None FINDINGS: Heart size and mediastinal contours appear normal. There is no pleural fluid, interstitial edema, or airspace disease.  The visualized osseous structures appear unremarkable. IMPRESSION: No acute cardiopulmonary disease. Electronically Signed   By: Signa Kell M.D.   On: 10/26/2023 16:33   US ABDOMEN LIMITED RUQ (LIVER/GB)  Result Date: 10/26/2023 CLINICAL DATA:  Right upper quadrant pain for 2 weeks EXAM: ULTRASOUND ABDOMEN LIMITED RIGHT UPPER QUADRANT COMPARISON:  MRCP 10/18/2023 FINDINGS: Gallbladder: Mo bile gallbladder sludge noted. No stones. No pericholecystic fluid or gallbladder wall thickening. Positive sonographic Murphy's sign reported. Common bile duct: Diameter: 3.9 mm Liver: No focal lesion identified. Within normal limits in parenchymal echogenicity. Portal vein is patent on color Doppler imaging with normal direction of blood flow towards the liver. Other: None. IMPRESSION: 1. Gallbladder sludge. No gallstones or gallbladder wall thickening. 2. Positive sonographic Murphy's sign reported. Electronically Signed   By: Signa Kell M.D.   On: 10/26/2023 15:32   MR ABDOMEN MRCP WO CONTRAST  Result Date: 10/18/2023 CLINICAL DATA:  57 year old female with history of right upper quadrant abdominal pain. EXAM: MRI ABDOMEN WITHOUT CONTRAST  (INCLUDING MRCP) TECHNIQUE: Multiplanar multisequence MR imaging of the abdomen was performed. Heavily T2-weighted images of the biliary and pancreatic ducts were obtained, and three-dimensional MRCP images were rendered by post processing. COMPARISON:  No prior abdominal MRI. Right upper quadrant abdominal ultrasound 10/17/2023. FINDINGS: Comment: Today's study is limited for detection and characterization of visceral and/or vascular lesions by lack of IV gadolinium. Lower chest: Unremarkable. Hepatobiliary: Subcentimeter T1 hypointense, T2 hyperintense lesions in the liver are incompletely characterized on today's noncontrast examination, but  statistically likely to represent tiny cysts or biliary hamartomas (no imaging follow-up recommended). Gallbladder is moderately  distended. Gallbladder wall thickness appears normal. No substantial volume of pericholecystic fluid noted. Tiny filling defects lying dependently in the gallbladder may represent biliary sludge and/or gallstones. No intra or extrahepatic biliary ductal dilatation. Common bile duct measures 5 mm in the porta hepatis. No filling defects in the common bile duct on MRCP images to suggest choledocholithiasis. Pancreas: No definite pancreatic mass on today's noncontrast examination. No pancreatic ductal dilatation. No pancreatic or peripancreatic fluid collections or inflammatory changes. Spleen:  Unremarkable. Adrenals/Urinary Tract: Several subcentimeter T1 hypointense, T2 hyperintense lesions in the kidneys are incompletely characterized on today's noncontrast examination, but statistically likely small cysts (no imaging follow-up recommended). No hydroureteronephrosis in the visualized portions of the abdomen. Bilateral adrenal glands are normal in appearance. Stomach/Bowel: Visualized portions are unremarkable. Vascular/Lymphatic: No aneurysm identified in the visualized abdominal vasculature. No lymphadenopathy noted in the abdomen. Other: No significant volume of ascites noted in the visualized portions of the peritoneal cavity. Musculoskeletal: No aggressive appearing osseous lesions are noted in the visualized portions of the skeleton. IMPRESSION: 1. Study demonstrates a small amount of biliary sludge and/or tiny gallstones lying dependently in the gallbladder. No overt imaging findings suggestive of acute cholecystitis on today's MRI examination. 2. No choledocholithiasis or findings of biliary tract obstruction. 3. Additional incidental findings, as above. Electronically Signed   By: Trudie Reed M.D.   On: 10/18/2023 10:47   MR 3D Recon At Scanner  Result Date: 10/18/2023 CLINICAL DATA:  57 year old female with history of right upper quadrant abdominal pain. EXAM: MRI ABDOMEN WITHOUT CONTRAST   (INCLUDING MRCP) TECHNIQUE: Multiplanar multisequence MR imaging of the abdomen was performed. Heavily T2-weighted images of the biliary and pancreatic ducts were obtained, and three-dimensional MRCP images were rendered by post processing. COMPARISON:  No prior abdominal MRI. Right upper quadrant abdominal ultrasound 10/17/2023. FINDINGS: Comment: Today's study is limited for detection and characterization of visceral and/or vascular lesions by lack of IV gadolinium. Lower chest: Unremarkable. Hepatobiliary: Subcentimeter T1 hypointense, T2 hyperintense lesions in the liver are incompletely characterized on today's noncontrast examination, but statistically likely to represent tiny cysts or biliary hamartomas (no imaging follow-up recommended). Gallbladder is moderately distended. Gallbladder wall thickness appears normal. No substantial volume of pericholecystic fluid noted. Tiny filling defects lying dependently in the gallbladder may represent biliary sludge and/or gallstones. No intra or extrahepatic biliary ductal dilatation. Common bile duct measures 5 mm in the porta hepatis. No filling defects in the common bile duct on MRCP images to suggest choledocholithiasis. Pancreas: No definite pancreatic mass on today's noncontrast examination. No pancreatic ductal dilatation. No pancreatic or peripancreatic fluid collections or inflammatory changes. Spleen:  Unremarkable. Adrenals/Urinary Tract: Several subcentimeter T1 hypointense, T2 hyperintense lesions in the kidneys are incompletely characterized on today's noncontrast examination, but statistically likely small cysts (no imaging follow-up recommended). No hydroureteronephrosis in the visualized portions of the abdomen. Bilateral adrenal glands are normal in appearance. Stomach/Bowel: Visualized portions are unremarkable. Vascular/Lymphatic: No aneurysm identified in the visualized abdominal vasculature. No lymphadenopathy noted in the abdomen. Other: No  significant volume of ascites noted in the visualized portions of the peritoneal cavity. Musculoskeletal: No aggressive appearing osseous lesions are noted in the visualized portions of the skeleton. IMPRESSION: 1. Study demonstrates a small amount of biliary sludge and/or tiny gallstones lying dependently in the gallbladder. No overt imaging findings suggestive of acute cholecystitis on today's MRI examination. 2. No choledocholithiasis or findings of biliary  tract obstruction. 3. Additional incidental findings, as above. Electronically Signed   By: Trudie Reed M.D.   On: 10/18/2023 10:47   ECHOCARDIOGRAM COMPLETE  Result Date: 10/17/2023    ECHOCARDIOGRAM REPORT   Patient Name:   GEORGINIA MEDEARIS Date of Exam: 10/17/2023 Medical Rec #:  [id number]       Height:       62.0 in Accession #:    [id number]      Weight:       154.0 lb Date of Birth:  02-Aug-1966       BSA:          1.711 m Patient Age:    57 years        BP:           134/88 mmHg Patient Gender: F               HR:           64 bpm. Exam Location:  [facility] Procedure: 2D Echo, Cardiac Doppler and Color Doppler Indications:     Pulmonary embouls  History:         Patient has no prior history of Echocardiogram examinations.                  Risk Factors:Hypertension.  Sonographer:     Mikki Harbor Referring Phys:  [id number] Emeline General Diagnosing Phys: Debbe Odea MD IMPRESSIONS  1. Left ventricular ejection fraction, by estimation, is 60 to 65%. The left ventricle has normal function. The left ventricle has no regional wall motion abnormalities. Left ventricular diastolic parameters were normal.  2. Right ventricular systolic function is normal. The right ventricular size is normal.  3. The mitral valve is normal in structure. Mild mitral valve regurgitation.  4. The aortic valve is tricuspid. Aortic valve regurgitation is not visualized. Aortic valve sclerosis is present, with no evidence of aortic valve stenosis.  5. The inferior vena cava  is normal in size with greater than 50% respiratory variability, suggesting right atrial pressure of 3 mmHg. FINDINGS  Left Ventricle: Left ventricular ejection fraction, by estimation, is 60 to 65%. The left ventricle has normal function. The left ventricle has no regional wall motion abnormalities. The left ventricular internal cavity size was normal in size. There is  no left ventricular hypertrophy. Left ventricular diastolic parameters were normal. Right Ventricle: The right ventricular size is normal. No increase in right ventricular wall thickness. Right ventricular systolic function is normal. Left Atrium: Left atrial size was normal in size. Right Atrium: Right atrial size was normal in size. Pericardium: There is no evidence of pericardial effusion. Mitral Valve: The mitral valve is normal in structure. Mild mitral valve regurgitation. MV peak gradient, 2.4 mmHg. The mean mitral valve gradient is 1.0 mmHg. Tricuspid Valve: The tricuspid valve is normal in structure. Tricuspid valve regurgitation is trivial. Aortic Valve: The aortic valve is tricuspid. Aortic valve regurgitation is not visualized. Aortic valve sclerosis is present, with no evidence of aortic valve stenosis. Aortic valve mean gradient measures 3.0 mmHg. Aortic valve peak gradient measures 6.9  mmHg. Aortic valve area, by VTI measures 2.69 cm. Pulmonic Valve: The pulmonic valve was normal in structure. Pulmonic valve regurgitation is trivial. Aorta: The aortic root is normal in size and structure. Venous: The inferior vena cava is normal in size with greater than 50% respiratory variability, suggesting right atrial pressure of 3 mmHg. IAS/Shunts: No atrial level shunt detected by color flow Doppler.  LEFT VENTRICLE  PLAX 2D LVIDd:         5.00 cm   Diastology LVIDs:         2.50 cm   LV e' medial:    7.62 cm/s LV PW:         0.90 cm   LV E/e' medial:  8.0 LV IVS:        0.90 cm   LV e' lateral:   12.70 cm/s LVOT diam:     2.10 cm   LV E/e'  lateral: 4.8 LV SV:         78 LV SV Index:   45 LVOT Area:     3.46 cm  RIGHT VENTRICLE RV Basal diam:  3.00 cm RV Mid diam:    2.90 cm RV S prime:     14.70 cm/s TAPSE (M-mode): 2.5 cm LEFT ATRIUM           Index        RIGHT ATRIUM           Index LA diam:      3.80 cm 2.22 cm/m   RA Area:     14.20 cm LA Vol (A2C): 52.2 ml 30.51 ml/m  RA Volume:   30.90 ml  18.06 ml/m LA Vol (A4C): 40.8 ml 23.85 ml/m  AORTIC VALVE                    PULMONIC VALVE AV Area (Vmax):    2.75 cm     PV Vmax:       0.83 m/s AV Area (Vmean):   2.50 cm     PV Peak grad:  2.8 mmHg AV Area (VTI):     2.69 cm AV Vmax:           131.00 cm/s AV Vmean:          84.500 cm/s AV VTI:            0.288 m AV Peak Grad:      6.9 mmHg AV Mean Grad:      3.0 mmHg LVOT Vmax:         104.00 cm/s LVOT Vmean:        60.900 cm/s LVOT VTI:          0.224 m LVOT/AV VTI ratio: 0.78  AORTA Ao Root diam: 3.10 cm MITRAL VALVE MV Area (PHT): 3.34 cm    SHUNTS MV Area VTI:   3.15 cm    Systemic VTI:  0.22 m MV Peak grad:  2.4 mmHg    Systemic Diam: 2.10 cm MV Mean grad:  1.0 mmHg MV Vmax:       0.77 m/s MV Vmean:      46.1 cm/s MV Decel Time: 227 msec MV E velocity: 60.80 cm/s MV A velocity: 64.70 cm/s MV E/A ratio:  0.94 Debbe Odea MD Electronically signed by Debbe Odea MD Signature Date/Time: 10/17/2023/4:17:28 PM    Final    US Venous Img Lower Bilateral (DVT)  Result Date: 10/17/2023 CLINICAL DATA:  Pulmonary embolism. History of previous DVT. Evaluate for acute or chronic DVT. EXAM: BILATERAL LOWER EXTREMITY VENOUS DOPPLER ULTRASOUND TECHNIQUE: Gray-scale sonography with graded compression, as well as color Doppler and duplex ultrasound were performed to evaluate the lower extremity deep venous systems from the level of the common femoral vein and including the common femoral, femoral, profunda femoral, popliteal and calf veins including the posterior tibial, peroneal and gastrocnemius veins when visible. The superficial great  saphenous  vein was also interrogated. Spectral Doppler was utilized to evaluate flow at rest and with distal augmentation maneuvers in the common femoral, femoral and popliteal veins. COMPARISON:  Chest CT- 10/17/2023 FINDINGS: RIGHT LOWER EXTREMITY Common Femoral Vein: No evidence of thrombus. Normal compressibility, respiratory phasicity and response to augmentation. Saphenofemoral Junction: No evidence of thrombus. Normal compressibility and flow on color Doppler imaging. Profunda Femoral Vein: No evidence of thrombus. Normal compressibility and flow on color Doppler imaging. Femoral Vein: No evidence of thrombus. Normal compressibility, respiratory phasicity and response to augmentation. Popliteal Vein: No evidence of thrombus. Normal compressibility, respiratory phasicity and response to augmentation. Calf Veins: No evidence of thrombus. Normal compressibility and flow on color Doppler imaging. Superficial Great Saphenous Vein: No evidence of thrombus. Normal compressibility. Other Findings:  None. LEFT LOWER EXTREMITY Common Femoral Vein: No evidence of thrombus. Normal compressibility, respiratory phasicity and response to augmentation. Saphenofemoral Junction: No evidence of thrombus. Normal compressibility and flow on color Doppler imaging. Profunda Femoral Vein: No evidence of thrombus. Normal compressibility and flow on color Doppler imaging. Femoral Vein: No evidence of thrombus. Normal compressibility, respiratory phasicity and response to augmentation. Popliteal Vein: No evidence of thrombus. Normal compressibility, respiratory phasicity and response to augmentation. Calf Veins: No evidence of thrombus. Normal compressibility and flow on color Doppler imaging. Superficial Great Saphenous Vein: No evidence of thrombus. Normal compressibility. Other Findings:  None. IMPRESSION: No evidence of acute or chronic DVT within either lower extremity. Electronically Signed   By: Simonne Come M.D.   On: 10/17/2023  10:43   CT Angio Chest PE W and/or Wo Contrast  Result Date: 10/17/2023 CLINICAL DATA:  Chest pain with positive D-dimer. Patient states pain is 8/10. EXAM: CT ANGIOGRAPHY CHEST WITH CONTRAST TECHNIQUE: Multidetector CT imaging of the chest was performed using the standard protocol during bolus administration of intravenous contrast. Multiplanar CT image reconstructions and MIPs were obtained to evaluate the vascular anatomy. RADIATION DOSE REDUCTION: This exam was performed according to the departmental dose-optimization program which includes automated exposure control, adjustment of the mA and/or kV according to patient size and/or use of iterative reconstruction technique. CONTRAST:  75mL OMNIPAQUE IOHEXOL 350 MG/ML SOLN COMPARISON:  Portable chest today, chest CT with contrast 09/19/2009. FINDINGS: Cardiovascular: There is acute near occlusive thrombus in the right upper lobe apical segmental artery and with extension into the 2 of its branch arteries, with additional subsegmental thrombus in the posterior segment of the right upper lobe (series 4 images 45-50; and images 43-45). This represents a small clot burden, without findings of right heart strain, without other visible emboli bilaterally. The pulmonary trunk and main arteries are normal caliber. There is mild cardiomegaly, with a left chamber predominance. There is no pericardial effusion. The pulmonary veins are mildly prominent. There is scattered calcific plaque in the LAD coronary artery only. The aorta and great vessels are unremarkable apart from mild descending aortic tortuosity. No visible plaques. Mediastinum/Nodes: No enlarged mediastinal, hilar, or axillary lymph nodes. Thyroid gland, trachea, and esophagus demonstrate no significant findings. Both main bronchi are patent. Lungs/Pleura: No pleural effusion, thickening or pneumothorax. The bronchi are mildly thickened. Again noted is a mildly elevated right hemidiaphragm. There is  scattered linear scarring or atelectasis in the left lower lobe. The lungs are clear of infiltrates and nodules. Upper Abdomen: Single small stone in the posterior gallbladder. Mild hepatic steatosis. Gallbladder is not fully seen. Musculoskeletal: Osteopenia, thoracic kyphosis and multilevel degenerative discs with multilevel bridging enthesopathy. No acute or other significant osseous  findings. The ribcage is intact. Review of the MIP images confirms the above findings. IMPRESSION: 1. Acute near occlusive thrombus in the right upper lobe apical segmental artery with extension into the 2 of its branch arteries, and additional subsegmental thrombus in the posterior segment of the right upper lobe. 2. Small overall clot burden with no findings of acute right heart strain. 3. Mild cardiomegaly with left chamber predominance and mildly prominent pulmonary veins. No pulmonary edema or pleural effusion. 4. Bronchitis without evidence of pneumonia. 5. Cholelithiasis. 6. Hepatic steatosis. 7. Osteopenia, kyphosis and degenerative change. 8. Critical Value/emergent results were called by telephone at the time of interpretation on 10/17/2023 at 7:20 am to provider DR. ISAACS, who verbally acknowledged these results. Electronically Signed   By: Almira Bar M.D.   On: 10/17/2023 07:20   US ABDOMEN LIMITED RUQ (LIVER/GB)  Result Date: 10/17/2023 CLINICAL DATA:  Chest and abdomen pain. [id number] with right upper quadrant abdomen pain. EXAM: ULTRASOUND ABDOMEN LIMITED RIGHT UPPER QUADRANT COMPARISON:  CT with IV contrast 10/12/2009. FINDINGS: Gallbladder: There is sludge and a few tiny layering stones in the gallbladder. The gallbladder is dilated to 12 cm in length with thickening over portions up to 7.9 mm, positive sonographic Murphy's sign, and pericholecystic fluid. Findings are worrisome for acute cholecystitis. Common bile duct: Diameter: 5.1 mm, within normal limits. No intrahepatic biliary prominence. Liver: No  focal lesion identified. There is mild increased parenchymal echogenicity consistent with steatosis. Portal vein is patent on color Doppler imaging with normal direction of blood flow towards the liver. Other: None. IMPRESSION: 1. Findings worrisome for acute cholecystitis. There is sludge and a few tiny stones in the gallbladder. Surgical consult recommended. 2. Hepatic steatosis. Electronically Signed   By: Almira Bar M.D.   On: 10/17/2023 07:03   DG Chest Port 1 View  Result Date: 10/17/2023 CLINICAL DATA:  57 year old female with history of chest pain. EXAM: PORTABLE CHEST 1 VIEW COMPARISON:  No priors. FINDINGS: Lung volumes are normal. No consolidative airspace disease. No pleural effusions. No pneumothorax. No pulmonary nodule or mass noted. Pulmonary vasculature and the cardiomediastinal silhouette are within normal limits. IMPRESSION: No radiographic evidence of acute cardiopulmonary disease. Electronically Signed   By: Trudie Reed M.D.   On: 10/17/2023 06:24    Microbiology: Results for orders placed or performed during the hospital encounter of 05/25/21  SARS CORONAVIRUS 2 (TAT 6-24 HRS) Nasopharyngeal Nasopharyngeal Swab     Status: None   Collection Time: 05/25/21  9:59 AM   Specimen: Nasopharyngeal Swab  Result Value Ref Range Status   SARS Coronavirus 2 NEGATIVE NEGATIVE Final    Comment: (NOTE) SARS-CoV-2 target nucleic acids are NOT DETECTED.  The SARS-CoV-2 RNA is generally detectable in upper and lower respiratory specimens during the acute phase of infection. Negative results do not preclude SARS-CoV-2 infection, do not rule out co-infections with other pathogens, and should not be used as the sole basis for treatment or other patient management decisions. Negative results must be combined with clinical observations, patient history, and epidemiological information. The expected result is Negative.  Fact Sheet for  Patients: HairSlick.no  Fact Sheet for Healthcare Providers: quierodirigir.com  This test is not yet approved or cleared by the [place] FDA and  has been authorized for detection and/or diagnosis of SARS-CoV-2 by FDA under an Emergency Use Authorization (EUA). This EUA will remain  in effect (meaning this test can be used) for the duration of the COVID-19 declaration under Se ction 564(b)(1) of  the Act, 21 U.S.C. section 360bbb-3(b)(1), unless the authorization is terminated or revoked sooner.  Performed at [facility], [address], [place], [place] [address]     Labs: CBC: Recent Labs  Lab 10/26/23 1239 10/27/23 0459  WBC 8.4 7.1  HGB 13.3 12.0  HCT 40.7 35.8*  MCV 92.5 91.3  PLT 373 325   Basic Metabolic Panel: Recent Labs  Lab 10/26/23 1239 10/27/23 0459  NA 142 141  K 4.1 3.8  CL 110 104  CO2 24 26  GLUCOSE 104* 89  BUN 12 14  CREATININE 0.57 0.71  CALCIUM 9.0 8.8*   Liver Function Tests: Recent Labs  Lab 10/26/23 1352 10/27/23 0459  AST 78* 72*  ALT 89* 81*  ALKPHOS 91 76  BILITOT 0.4 0.5  PROT 7.3 6.3*  ALBUMIN 3.8 3.3*   CBG: Recent Labs  Lab 10/27/23 0744  GLUCAP 88    Discharge time spent: less than 30 minutes.  Signed: Pennie Banter, DO [facility] 10/30/2023

## 2023-10-21 NOTE — Progress Notes (Signed)
Patient ID: Jamie [name], female   DOB: July 06, 1966, 57 y.o.   MRN: [id number]     SURGICAL PROGRESS NOTE   Hospital Day(s): 4.   Interval History: Patient seen and examined, no acute events or new complaints overnight. Patient reports feeling okay.  Her main complaint this morning is that she is still continue having headaches.  She denies any significant abdominal pain.  She endorses she tolerated full liquids without abdominal pain.  Denies any nausea or vomiting.  Vital signs in last 24 hours: [min-max] current  Temp:  [97.2 F (36.2 C)-98.5 F (36.9 C)] 98.4 F (36.9 C) (10/22 0741) Pulse Rate:  [62-81] 77 (10/22 0741) Resp:  [16-19] 16 (10/22 0741) BP: (97-141)/(67-93) 97/70 (10/22 0741) SpO2:  [94 %-98 %] 94 % (10/22 0741)     Height: 5\' 2"  (157.5 cm) Weight: 69.9 kg BMI (Calculated): 28.16   Physical Exam:  Constitutional: alert, cooperative and no distress  Respiratory: breathing non-labored at rest  Cardiovascular: regular rate and sinus rhythm  Gastrointestinal: soft, non-tender, and non-distended  Labs:     Latest Ref Rng & Units 10/20/2023    4:07 AM 10/19/2023    4:11 AM 10/18/2023    4:11 AM  CBC  WBC 4.0 - 10.5 K/uL 6.2  6.7  6.5   Hemoglobin 12.0 - 15.0 g/dL 40.9  81.1  91.4   Hematocrit 36.0 - 46.0 % 34.3  37.3  37.7   Platelets 150 - 400 K/uL 282  270  244       Latest Ref Rng & Units 10/21/2023    4:25 AM 10/20/2023    4:07 AM 10/19/2023    4:11 AM  CMP  Glucose 70 - 99 mg/dL 82  782  64   BUN 6 - 20 mg/dL 10  13  19    Creatinine 0.44 - 1.00 mg/dL 9.56  2.13  0.86   Sodium 135 - 145 mmol/L 139  136  136   Potassium 3.5 - 5.1 mmol/L 3.4  3.4  3.4   Chloride 98 - 111 mmol/L 102  101  102   CO2 22 - 32 mmol/L 29  26  23    Calcium 8.9 - 10.3 mg/dL 8.6  8.4  8.2   Total Protein 6.5 - 8.1 g/dL  6.0  6.3   Total Bilirubin 0.3 - 1.2 mg/dL  0.6  1.0   Alkaline Phos 38 - 126 U/L  98  109   AST 15 - 41 U/L  42  60   ALT 0 - 44 U/L  57  75     Imaging  studies: No new pertinent imaging studies   Assessment/Plan:  57 y.o. female with cholecystitis, complicated by pertinent comorbidities including acute PE.   -Patient without any clinical deterioration.  No significant abdominal pain -Patient tolerated full liquid diet -Will advance diet to soft diet -If patient tolerated soft diet without worsening abdominal pain, no contraindication to discharge with oral antibiotic therapy for 14 days from surgical standpoint -Continue management of PE, may transition to oral anticoagulation at discharge -Headache/migraine management as per primary team  Gae Gallop, MD

## 2023-10-21 NOTE — Telephone Encounter (Signed)
Please schedule this patient at 4:40 PM tomorrow , I see its available. Please schedule in person if possible due to needing changes .

## 2023-10-21 NOTE — Plan of Care (Signed)

## 2023-10-21 NOTE — Progress Notes (Signed)
PHARMACY - ANTICOAGULATION CONSULT NOTE  Pharmacy Consult for warfarin Indication: pulmonary embolus  Allergies  Allergen Reactions   Hydrocodone-Acetaminophen Nausea Only   Neuromuscular Blocking Agents Other (See Comments)    Don't work    Patient Measurements: Height: 5\' 2"  (157.5 cm) Weight: 69.9 kg (154 lb) IBW/kg (Calculated) : 50.1  Vital Signs: Temp: 98.4 F (36.9 C) (10/22 0741) Temp Source: Oral (10/22 0741) BP: 97/70 (10/22 0741) Pulse Rate: 77 (10/22 0741)  Labs: Recent Labs    10/19/23 0411 10/20/23 0407 10/21/23 0425  HGB 12.2 11.4*  --   HCT 37.3 34.3*  --   PLT 270 282  --   HEPARINUNFRC 0.49  --   --   CREATININE 0.67 0.72 0.65    Estimated Creatinine Clearance: 71.9 mL/min (by C-G formula based on SCr of 0.65 mg/dL).   Medical History: Past Medical History:  Diagnosis Date   Anxiety    Brachial neuritis    Cervical cancer (HCC)    Chronic neck pain    Chronic pain not due to malignancy    Depressive disorder    followed by Dr. Mordecai Rasmussen.   Disturbance, sleep    DVT (deep venous thrombosis) (HCC)    left arm   Elevated blood pressure reading without diagnosis of hypertension    Embolism and thrombosis of splenic artery    H/O thrombophlebitis    Infarction of spleen    Lumbosacral neuritis    Obesity, Class II, BMI 35-39.9, with comorbidity     Medications:  Scheduled:   amitriptyline  100 mg Oral QHS   brexpiprazole  3 mg Oral Q supper   carbamazepine  200 mg Oral BID   enoxaparin (LOVENOX) injection  1 mg/kg Subcutaneous Q12H   gabapentin  300 mg Oral QHS   pantoprazole  40 mg Oral Daily   polyethylene glycol  17 g Oral Daily   potassium chloride  40 mEq Oral Q4H   senna-docusate  1 tablet Oral BID    Assessment: 57 yo female presented to ED with chest pain. Patient has history of DVT not currently on anticoagulation. Imaging reveal acute PE.   DDI: carbamazepine and warfarin: may result in reduced warfarin exposure,  reduced efficacy of warfarin and reduced INR  Goal of Therapy:  INR 2-3 Monitor platelets by anticoagulation protocol: Yes   Plan:   1) continue enoxaparin 1 mg/kg subcutaneously every 12 hours ---follow renal function for needed dose adjustments ---CBC at least every 72 hours  2) start warfarin 7.5 mg po x 1 ---patient has 5 predictor points according to [facility] warfarin protocol and baseline INR < 1.0 ---daily INR to guide therapy  Jamie [name] 10/21/2023,10:08 AM

## 2023-10-21 NOTE — Telephone Encounter (Signed)
pt called states that she was in the hospital and they told her that she had blood clot and she was put on coumadin and she states that she needed to call dr. Elna Breslow office because the mood stablizer she on interacts with the coumadin.  pt was last seen on 8-12 last seen on 11-12

## 2023-10-22 ENCOUNTER — Encounter: Payer: Self-pay | Admitting: Psychiatry

## 2023-10-22 ENCOUNTER — Ambulatory Visit: Payer: Self-pay | Admitting: Psychiatry

## 2023-10-22 ENCOUNTER — Telehealth: Payer: Self-pay

## 2023-10-22 ENCOUNTER — Other Ambulatory Visit: Payer: 59

## 2023-10-22 VITALS — BP 133/85 | HR 103 | Temp 97.7°F | Ht 62.0 in | Wt 160.2 lb

## 2023-10-22 DIAGNOSIS — Z5181 Encounter for therapeutic drug level monitoring: Secondary | ICD-10-CM | POA: Diagnosis not present

## 2023-10-22 DIAGNOSIS — F411 Generalized anxiety disorder: Secondary | ICD-10-CM | POA: Diagnosis not present

## 2023-10-22 DIAGNOSIS — G4701 Insomnia due to medical condition: Secondary | ICD-10-CM

## 2023-10-22 DIAGNOSIS — F331 Major depressive disorder, recurrent, moderate: Secondary | ICD-10-CM | POA: Diagnosis not present

## 2023-10-22 MED ORDER — HYDROXYZINE PAMOATE 25 MG PO CAPS: 25.0000 mg | ORAL_CAPSULE | Freq: Three times a day (TID) | ORAL | 1 refills | Status: DC | PRN

## 2023-10-22 NOTE — Telephone Encounter (Signed)
Left VM with [facility] to see if Dr. Sampson Goon can take over INR/ coumadin managment. Received call back from [facility] stating that they have an INR clinic there and will refer pt for further management. Dr. Sampson Goon will see pt today.

## 2023-10-22 NOTE — Progress Notes (Signed)
BH MD OP Progress Note  10/22/2023 5:07 PM Jamie [name]  MRN:  [id number]  Chief Complaint:  Chief Complaint  Patient presents with   Follow-up   Anxiety   Depression   Medication Refill   HPI: Jamie [name] is a 57 year old Caucasian female, married, lives in [place], has a history of MDD, insomnia, chronic pain was evaluated in office today.  Patient with recent medical floor admission for pulmonary embolism, acute cholecystitis, I have reviewed notes per Dr. Denton Lank dated 10/17/2023 - 10/21/2023.  Patient discharged on warfarin.'  Patient contacted this office with concerns about her medications having interaction with warfarin.  Hence this appointment was scheduled.  Patient today appeared to be in severe pain of her right upper abdomen.  She currently rates her pain at an 8 out of 10.  She does have oxycodone available and has been using it.  Patient reports she has scheduled appointments with her providers for follow-up.  Patient worries about the fact that she is unable to go back to work and her provider asked her to take FMLA.  Patient reports she is currently on a 90-day probation at her work since this is a new job and worries that she may lose it.  Patient also reports sadness, lack of motivation due to her current medical problems.  She is unable to do much because of her pain.  She also reports sleep issues, gets around 4 to 5 hours of sleep only.  Currently restless.  Patient denies any suicidality, homicidality or perceptual disturbances.  Patient currently is compliant on medications including carbamazepine, Rexulti, amitriptyline, propranolol as needed, denies side effects.  Patient agreeable to discontinuation of carbamazepine and propranolol due to drug to drug interaction with her current warfarin therapy.  Agreeable to trial of hydroxyzine as needed for anxiety as well as agrees to establish care with therapist.    Visit Diagnosis:    ICD-10-CM   1. MDD  (major depressive disorder), recurrent episode, moderate (HCC)  F33.1 hydrOXYzine (VISTARIL) 25 MG capsule    2. Insomnia due to medical condition  G47.01 hydrOXYzine (VISTARIL) 25 MG capsule   Multifactorial including pain.    3. GAD (generalized anxiety disorder)  F41.1 hydrOXYzine (VISTARIL) 25 MG capsule    4. Medication monitoring encounter  Z51.81       Past Psychiatric History: I have reviewed past psychiatric history from progress note on 03/08/2019.  Past Medical History:  Past Medical History:  Diagnosis Date   Anxiety    Brachial neuritis    Cervical cancer (HCC)    Chronic neck pain    Chronic pain not due to malignancy    Depressive disorder    followed by Dr. Mordecai Rasmussen.   Disturbance, sleep    DVT (deep venous thrombosis) (HCC)    left arm   Elevated blood pressure reading without diagnosis of hypertension    Embolism and thrombosis of splenic artery    H/O thrombophlebitis    Infarction of spleen    Lumbosacral neuritis    Obesity, Class II, BMI 35-39.9, with comorbidity     Past Surgical History:  Procedure Laterality Date   CESAREAN SECTION  09/24/2010   IVC FILTER INSERTION N/A 05/24/2021   Procedure: IVC FILTER INSERTION;  Surgeon: Annice Needy, MD;  Location: [facility] INVASIVE CV LAB;  Service: Cardiovascular;  Laterality: N/A;   IVC FILTER REMOVAL N/A 07/18/2021   Procedure: IVC FILTER REMOVAL;  Surgeon: Annice Needy, MD;  Location: [facility] INVASIVE  CV LAB;  Service: Cardiovascular;  Laterality: N/A;   TONSILLECTOMY AND ADENOIDECTOMY  09/24/2010   TOTAL HIP ARTHROPLASTY Right 05/29/2021   Procedure: TOTAL HIP ARTHROPLASTY ANTERIOR APPROACH;  Surgeon: Kennedy Bucker, MD;  Location: [facility] ORS;  Service: Orthopedics;  Laterality: Right;   TUBAL LIGATION  09/24/2010    Family Psychiatric History: I have reviewed family psychiatric history from progress note on 03/08/2019.  Family History:  Family History  Problem Relation Age of Onset   Anxiety disorder Mother     Depression Mother    Alcohol abuse Brother    Depression Daughter     Social History: I have reviewed social history from progress note on 03/08/2019. Social History   Socioeconomic History   Marital status: Married    Spouse name: rodney   Number of children: 3   Years of education: Not on file   Highest education level: Some college, no degree  Occupational History    Comment: full time  Tobacco Use   Smoking status: Never   Smokeless tobacco: Never  Vaping Use   Vaping status: Never Used  Substance and Sexual Activity   Alcohol use: Yes    Alcohol/week: 0.0 standard drinks of alcohol    Comment: social   Drug use: Not Currently    Types: Marijuana   Sexual activity: Yes    Partners: Male    Birth control/protection: Surgical    Comment: Tubal ligation   Other Topics Concern   Not on file  Social History Narrative   Not on file   Social Determinants of Health   Financial Resource Strain: Medium Risk (02/03/2018)   Overall Financial Resource Strain (CARDIA)    Difficulty of Paying Living Expenses: Somewhat hard  Food Insecurity: No Food Insecurity (10/17/2023)   Hunger Vital Sign    Worried About Running Out of Food in the Last Year: Never true    Ran Out of Food in the Last Year: Never true  Transportation Needs: Patient Declined (10/17/2023)   PRAPARE - Transportation    Lack of Transportation (Medical): Patient declined    Lack of Transportation (Non-Medical): Patient declined  Physical Activity: Inactive (02/03/2018)   Exercise Vital Sign    Days of Exercise per Week: 0 days    Minutes of Exercise per Session: 0 min  Stress: Stress Concern Present (02/03/2018)   [employer] of Occupational Health - Occupational Stress Questionnaire    Feeling of Stress : Very much  Social Connections: Somewhat Isolated (02/03/2018)   Social Connection and Isolation Panel [NHANES]    Frequency of Communication with Friends and Family: More than three times a week     Frequency of Social Gatherings with Friends and Family: Never    Attends Religious Services: Never    Database administrator or Organizations: No    Attends Banker Meetings: Never    Marital Status: Married    Allergies:  Allergies  Allergen Reactions   Hydrocodone-Acetaminophen Nausea Only   Neuromuscular Blocking Agents Other (See Comments)    Don't work    Metabolic Disorder Labs: Lab Results  Component Value Date   HGBA1C 5.6 10/31/2021   MPG 114.02 10/31/2021   Lab Results  Component Value Date   PROLACTIN 13.5 10/31/2021   No results found for: "CHOL", "TRIG", "HDL", "CHOLHDL", "VLDL", "LDLCALC" No results found for: "TSH"  Therapeutic Level Labs: No results found for: "LITHIUM" No results found for: "VALPROATE" Lab Results  Component Value Date   CBMZ 5.4 11/01/2022  Current Medications: Current Outpatient Medications  Medication Sig Dispense Refill   acetaminophen (TYLENOL) 325 MG tablet Take 2 tablets (650 mg total) by mouth every 6 (six) hours as needed for mild pain (pain score 1-3), headache or fever.     amitriptyline (ELAVIL) 100 MG tablet TAKE 1 TABLET BY MOUTH AT BEDTIME 30 tablet 2   amoxicillin-clavulanate (AUGMENTIN) 875-125 MG tablet Take 1 tablet by mouth 2 (two) times daily for 6 days. 12 tablet 0   Brexpiprazole (REXULTI) 3 MG TABS Take 1 tablet (3 mg total) by mouth daily with supper. 30 tablet 1   butalbital-acetaminophen-caffeine (FIORICET) 50-325-40 MG tablet Take 1 tablet by mouth every 6 (six) hours as needed (headache refractory to tylenol). 14 tablet 0   enoxaparin (LOVENOX) 80 MG/0.8ML injection Inject 0.7 mLs (70 mg total) into the skin every 12 (twelve) hours for 14 days. 19.6 mL 0   gabapentin (NEURONTIN) 300 MG capsule Take 300 mg by mouth daily.     hydrOXYzine (VISTARIL) 25 MG capsule Take 1 capsule (25 mg total) by mouth 3 (three) times daily as needed for anxiety. And sleep 90 capsule 1   naloxone (NARCAN) nasal  spray 4 mg/0.1 mL as needed.     ondansetron (ZOFRAN) 4 MG tablet Take 1 tablet (4 mg total) by mouth every 6 (six) hours as needed for nausea. 20 tablet 0   oxyCODONE (OXY IR/ROXICODONE) 5 MG immediate release tablet Take 1-2 tablets (5-10 mg total) by mouth every 4 (four) hours as needed for moderate pain (pain score 4-6) or severe pain (pain score 7-10). 30 tablet 0   polyethylene glycol (MIRALAX / GLYCOLAX) 17 g packet Take 17 g by mouth daily. 30 each 0   warfarin (COUMADIN) 2.5 MG tablet Take 1 tablet (2.5 mg total) by mouth one time only at 4 PM for 1 dose. On 10/23 1 tablet 0   warfarin (COUMADIN) 5 MG tablet Take 1 tablet (5 mg total) by mouth daily for 20 days. 20 tablet 0   ibuprofen (ADVIL) 200 MG tablet Take 400 mg by mouth every 6 (six) hours as needed for fever, cramping or mild pain (pain score 1-3). (Patient not taking: Reported on 10/22/2023)     No current facility-administered medications for this visit.     Musculoskeletal: Strength & Muscle Tone: within normal limits Gait & Station: normal Patient leans: N/A  Psychiatric Specialty Exam: Review of Systems  Psychiatric/Behavioral:  Positive for sleep disturbance. The patient is nervous/anxious.     Blood pressure 133/85, pulse (!) 103, temperature 97.7 F (36.5 C), temperature source Skin, height 5\' 2"  (1.575 m), weight 160 lb 3.2 oz (72.7 kg), last menstrual period 02/18/2018.Body mass index is 29.3 kg/m.  General Appearance: Fairly Groomed  Eye Contact:  Fair  Speech:  Clear and Coherent  Volume:  Normal  Mood:  Anxious  Affect:  Congruent  Thought Process:  Goal Directed and Descriptions of Associations: Intact  Orientation:  Full (Time, Place, and Person)  Thought Content: Logical   Suicidal Thoughts:  No  Homicidal Thoughts:  No  Memory:  Immediate;   Fair Recent;   Fair Remote;   Fair  Judgement:  Fair  Insight:  Fair  Psychomotor Activity:  Normal  Concentration:  Concentration: Fair and Attention  Span: Fair  Recall:  [employer] of Knowledge: Fair  Language: Fair  Akathisia:  No  Handed:  Right  AIMS (if indicated):  denies tremors, muscle spasms  Assets:  Communication Skills Desire  for Improvement Housing Social Support  ADL's:  Intact  Cognition: WNL  Sleep:   restless   Screenings: Geneticist, molecular Office Visit from 11/20/2022 in [facility] Video Visit from 10/14/2022 in [facility] Office Visit from 09/11/2022 in [facility] Video Visit from 05/13/2022 in [facility] Video Visit from 02/26/2022 in [facility]  AIMS Total Score 0 0 0 0 0      GAD-7    Flowsheet Row Office Visit from 10/22/2023 in [facility] Office Visit from 11/20/2022 in [facility] Office Visit from 09/11/2022 in [facility] Video Visit from 05/13/2022 in [facility] Video Visit from 02/26/2022 in [facility]  Total GAD-7 Score 14 4 9 3 6       PHQ2-9    Flowsheet Row Office Visit from 10/22/2023 in [facility] Counselor from 04/02/2023 in [facility] Visit from 11/20/2022 in [facility] Video Visit from 10/24/2022 in [facility] Video Visit from 10/14/2022 in [facility]  PHQ-2 Total Score 2 2 2 5 2   PHQ-9 Total Score 8 17 5 11 5       Flowsheet Row Office Visit from 10/22/2023 in [facility] ED to Hosp-Admission (Discharged) from 10/17/2023 in  [facility] Video Visit from 08/11/2023 in [facility]  C-SSRS RISK CATEGORY No Risk No Risk No Risk        Assessment and Plan: SHYLIA GUIDO is a 57 year old Caucasian female who has a history of MDD, chronic pain, insomnia was evaluated in office today.  Patient with medical service admission for pulmonary embolism as well as acute cholecystitis diagnosis, currently on warfarin therapy, presents for follow-up appointment for medication management as well as concerns for warfarin therapy interaction with current psychotropics.  Patient with current anxiety and depression symptoms mostly related to her current medical problems and limitations from the same, will benefit from the following plan.  Plan MDD-unstable Continue amitriptyline 100 mg p.o. nightly Continue Rexulti 3 mg p.o. daily. Discontinue Tegretol due to drug to drug interaction with warfarin.   GAD-unstable Start hydroxyzine 25 mg twice a day as needed. Will consider low dose Benzodiazepine therapy , however patient currently on opioids for management of pain,will await until she completes her course. Discontinue propranolol for drug-drug interaction with warfarin. Patient advised to establish care with therapist, communicated with staff to schedule this patient with a new therapist.  Insomnia-restless-multifactorial Patient with multiple problems including pain which does have an impact on her sleep Patient will need management of the same. Amitriptyline 100 mg p.o. nightly  I have reviewed labs - 10/21/23 -[facility] INR - wnl,BMP - potassium and ca low , otherwise wnl - patient currently followed by primary,AST/ALT - elevated (1021/24) - will consider recheck.   Follow-up in clinic in 3 weeks or sooner if needed.  Collaboration of Care: Collaboration of Care: Referral or follow-up with counselor/therapist AEB patient encouraged to schedule an  appointment with therapist, communicated with staff.  I have reviewed notes per Dr. Elmarie Shiley medical service admission as noted above-discharge date 10/21/2023.  Patient/Guardian was advised Release of  Information must be obtained prior to any record release in order to collaborate their care with an outside provider. Patient/Guardian was advised if they have not already done so to contact the registration department to sign all necessary forms in order for [place] to release information regarding their care.   Consent: Patient/Guardian gives verbal consent for treatment and assignment of benefits for services provided during this visit. Patient/Guardian expressed understanding and agreed to proceed.   I have spent atleast 30 minutes face to face with patient today which includes the time spent for preparing to see the patient ( e.g., review of test, records ), obtaining and to review and separately obtained history , ordering medications and test ,psychoeducation and supportive psychotherapy and care coordination,as well as documenting clinical information in electronic health record,interpreting and communication of test results  This note was generated in part or whole with voice recognition software. Voice recognition is usually quite accurate but there are transcription errors that can and very often do occur. I apologize for any typographical errors that were not detected and corrected.    Jomarie Longs, MD 10/24/2023, 8:49 AM

## 2023-10-22 NOTE — Patient Instructions (Signed)
Please stop taking carbamazepine and propranolol.  These 2 medications does have an interaction with the warfarin. Please establish care with therapist. Please start taking hydroxyzine as needed for anxiety.

## 2023-10-26 ENCOUNTER — Emergency Department: Payer: 59

## 2023-10-26 ENCOUNTER — Observation Stay
Admission: EM | Admit: 2023-10-26 | Discharge: 2023-10-27 | Disposition: A | Discharge status: Home / Self Care | Payer: 59 | Attending: Hospitalist | Admitting: Hospitalist

## 2023-10-26 ENCOUNTER — Encounter: Payer: Self-pay | Admitting: Family Medicine

## 2023-10-26 ENCOUNTER — Other Ambulatory Visit: Payer: Self-pay

## 2023-10-26 DIAGNOSIS — R1011 Right upper quadrant pain: Principal | ICD-10-CM | POA: Diagnosis present

## 2023-10-26 DIAGNOSIS — G4701 Insomnia due to medical condition: Secondary | ICD-10-CM

## 2023-10-26 DIAGNOSIS — F331 Major depressive disorder, recurrent, moderate: Secondary | ICD-10-CM | POA: Diagnosis not present

## 2023-10-26 DIAGNOSIS — Z7901 Long term (current) use of anticoagulants: Secondary | ICD-10-CM | POA: Diagnosis not present

## 2023-10-26 DIAGNOSIS — F411 Generalized anxiety disorder: Secondary | ICD-10-CM | POA: Diagnosis not present

## 2023-10-26 DIAGNOSIS — I2694 Multiple subsegmental pulmonary emboli without acute cor pulmonale: Secondary | ICD-10-CM

## 2023-10-26 DIAGNOSIS — I2699 Other pulmonary embolism without acute cor pulmonale: Secondary | ICD-10-CM | POA: Diagnosis present

## 2023-10-26 DIAGNOSIS — G4700 Insomnia, unspecified: Secondary | ICD-10-CM | POA: Insufficient documentation

## 2023-10-26 DIAGNOSIS — Z8541 Personal history of malignant neoplasm of cervix uteri: Secondary | ICD-10-CM | POA: Diagnosis not present

## 2023-10-26 DIAGNOSIS — F321 Major depressive disorder, single episode, moderate: Secondary | ICD-10-CM | POA: Diagnosis not present

## 2023-10-26 DIAGNOSIS — Z96641 Presence of right artificial hip joint: Secondary | ICD-10-CM | POA: Insufficient documentation

## 2023-10-26 DIAGNOSIS — R519 Headache, unspecified: Secondary | ICD-10-CM | POA: Diagnosis not present

## 2023-10-26 DIAGNOSIS — Z86718 Personal history of other venous thrombosis and embolism: Secondary | ICD-10-CM | POA: Diagnosis not present

## 2023-10-26 LAB — HEPATIC FUNCTION PANEL
ALT: 89 U/L — ABNORMAL HIGH (ref 0–44)
AST: 78 U/L — ABNORMAL HIGH (ref 15–41)
Albumin: 3.8 g/dL (ref 3.5–5.0)
Alkaline Phosphatase: 91 U/L (ref 38–126)
Bilirubin, Direct: <0.1 mg/dL (ref 0.0–0.2)
Total Bilirubin: 0.4 mg/dL (ref 0.3–1.2)
Total Protein: 7.3 g/dL (ref 6.5–8.1)

## 2023-10-26 LAB — BASIC METABOLIC PANEL
Anion gap: 8 (ref 5–15)
BUN: 12 mg/dL (ref 6–20)
CO2: 24 mmol/L (ref 22–32)
Calcium: 9 mg/dL (ref 8.9–10.3)
Chloride: 110 mmol/L (ref 98–111)
Creatinine, Ser: 0.57 mg/dL (ref 0.44–1.00)
GFR, Estimated: >60 mL/min (ref >60)
Glucose, Bld: 104 mg/dL — ABNORMAL HIGH (ref 70–99)
Potassium: 4.1 mmol/L (ref 3.5–5.1)
Sodium: 142 mmol/L (ref 135–145)

## 2023-10-26 LAB — CBC
HCT: 40.7 % (ref 36.0–46.0)
Hemoglobin: 13.3 g/dL (ref 12.0–15.0)
MCH: 30.2 pg (ref 26.0–34.0)
MCHC: 32.7 g/dL (ref 30.0–36.0)
MCV: 92.5 fL (ref 80.0–100.0)
Platelets: 373 10*3/uL (ref 150–400)
RBC: 4.4 MIL/uL (ref 3.87–5.11)
RDW: 12.7 % (ref 11.5–15.5)
WBC: 8.4 10*3/uL (ref 4.0–10.5)
nRBC: 0 % (ref 0.0–0.2)

## 2023-10-26 LAB — TROPONIN I (HIGH SENSITIVITY)
Troponin I (High Sensitivity): 3 ng/L (ref <18)
Troponin I (High Sensitivity): 4 ng/L (ref <18)

## 2023-10-26 LAB — PROTIME-INR
INR: 1.5 — ABNORMAL HIGH (ref 0.8–1.2)
Prothrombin Time: 18.3 s — ABNORMAL HIGH (ref 11.4–15.2)

## 2023-10-26 MED ORDER — ONDANSETRON HCL 4 MG/2ML IJ SOLN
4.0000 mg | Freq: Four times a day (QID) | INTRAMUSCULAR | Status: DC | PRN
  Administered 2023-10-27: 4 mg via INTRAVENOUS
  Filled 2023-10-26: qty 2

## 2023-10-26 MED ORDER — HYDROXYZINE HCL 25 MG PO TABS
25.0000 mg | ORAL_TABLET | Freq: Three times a day (TID) | ORAL | Status: DC | PRN
  Administered 2023-10-27: 25 mg via ORAL
  Filled 2023-10-26: qty 1

## 2023-10-26 MED ORDER — OXYCODONE HCL 5 MG PO TABS
10.0000 mg | ORAL_TABLET | Freq: Four times a day (QID) | ORAL | Status: DC | PRN
  Administered 2023-10-26: 10 mg via ORAL
  Filled 2023-10-26: qty 2

## 2023-10-26 MED ORDER — IOHEXOL 300 MG/ML  SOLN
100.0000 mL | Freq: Once | INTRAMUSCULAR | Status: AC | PRN
  Administered 2023-10-26: 100 mL via INTRAVENOUS

## 2023-10-26 MED ORDER — AMITRIPTYLINE HCL 50 MG PO TABS
100.0000 mg | ORAL_TABLET | Freq: Every day | ORAL | Status: DC
  Administered 2023-10-26: 100 mg via ORAL
  Filled 2023-10-26 (×3): qty 2
  Filled 2023-10-26: qty 1

## 2023-10-26 MED ORDER — PIPERACILLIN-TAZOBACTAM 3.375 G IVPB
3.3750 g | Freq: Three times a day (TID) | INTRAVENOUS | Status: DC
  Administered 2023-10-26 – 2023-10-27 (×3): 3.375 g via INTRAVENOUS
  Filled 2023-10-26 (×3): qty 50

## 2023-10-26 MED ORDER — ENOXAPARIN SODIUM 80 MG/0.8ML IJ SOSY
1.0000 mg/kg | PREFILLED_SYRINGE | Freq: Two times a day (BID) | INTRAMUSCULAR | Status: DC
  Administered 2023-10-26 – 2023-10-27 (×2): 70 mg via SUBCUTANEOUS
  Filled 2023-10-26 (×3): qty 0.7

## 2023-10-26 MED ORDER — HYDROMORPHONE HCL 1 MG/ML IJ SOLN
1.0000 mg | Freq: Once | INTRAMUSCULAR | Status: AC
  Administered 2023-10-26: 1 mg via INTRAVENOUS
  Filled 2023-10-26: qty 1

## 2023-10-26 MED ORDER — WARFARIN SODIUM 5 MG PO TABS
5.0000 mg | ORAL_TABLET | Freq: Every day | ORAL | Status: DC
  Filled 2023-10-26: qty 1

## 2023-10-26 MED ORDER — BREXPIPRAZOLE 1 MG PO TABS
3.0000 mg | ORAL_TABLET | Freq: Every day | ORAL | Status: DC
  Administered 2023-10-26: 3 mg via ORAL
  Filled 2023-10-26 (×2): qty 3

## 2023-10-26 MED ORDER — PIPERACILLIN-TAZOBACTAM 3.375 G IVPB 30 MIN
3.3750 g | Freq: Once | INTRAVENOUS | Status: AC
  Administered 2023-10-26: 3.375 g via INTRAVENOUS
  Filled 2023-10-26: qty 50

## 2023-10-26 MED ORDER — HYDROMORPHONE HCL 1 MG/ML IJ SOLN
1.0000 mg | INTRAMUSCULAR | Status: DC | PRN
  Administered 2023-10-26 – 2023-10-27 (×3): 1 mg via INTRAVENOUS
  Filled 2023-10-26 (×3): qty 1

## 2023-10-26 MED ORDER — WARFARIN - PHARMACIST DOSING INPATIENT
Freq: Every day | Status: DC
Start: 2023-10-27 — End: 2023-10-27

## 2023-10-26 MED ORDER — GABAPENTIN 300 MG PO CAPS
300.0000 mg | ORAL_CAPSULE | Freq: Every day | ORAL | Status: DC
  Administered 2023-10-27: 300 mg via ORAL
  Filled 2023-10-26 (×2): qty 1

## 2023-10-26 MED ORDER — ONDANSETRON HCL 4 MG/2ML IJ SOLN
4.0000 mg | Freq: Once | INTRAMUSCULAR | Status: AC
  Administered 2023-10-26: 4 mg via INTRAVENOUS
  Filled 2023-10-26: qty 2

## 2023-10-26 MED ORDER — ACETAMINOPHEN 325 MG PO TABS
650.0000 mg | ORAL_TABLET | Freq: Four times a day (QID) | ORAL | Status: DC | PRN
  Administered 2023-10-27: 650 mg via ORAL
  Filled 2023-10-26: qty 2

## 2023-10-26 MED ORDER — POLYETHYLENE GLYCOL 3350 17 G PO PACK: 17.0000 g | PACK | Freq: Every day | ORAL | Status: DC

## 2023-10-26 MED ORDER — HYDROMORPHONE HCL 1 MG/ML IJ SOLN
1.0000 mg | Freq: Once | INTRAMUSCULAR | Status: AC
Start: 2023-10-26 — End: 2023-10-26
  Administered 2023-10-26: 1 mg via INTRAVENOUS
  Filled 2023-10-26: qty 1

## 2023-10-26 NOTE — Progress Notes (Signed)
Case d/w Dr Fuller Plan. 57 yo recently admitted  10 days ago for RUQ pain w clinical findings suspicious for cholecystitis, in addition pt w hx thrombotic events to include splenic infarction and PE. Started on anticoagulation but not fully therapeutic. Comes back with exacerbation of pain. U/S and  MRCP, CT scan pers reviewed w/o clear cut signs of acute cholecystitis or other acute intra-abdominal pathology. At this time given acuity of PE  will start medical therapy and may need cholecystostomy tube, I will d/w Dr Maia Plan as he already had developed a plan for her. For now  Recommend antibiotics,  fluids, and admission by hospitalist for now.  No need for emergent surgical intervention. Full consult to follow

## 2023-10-26 NOTE — ED Notes (Signed)
Patient ambulatory to toilet independently. 

## 2023-10-26 NOTE — H&P (Signed)
History and Physical    Patient: Jamie [name] QMV:[id number] DOB: Jan 15, 1966 DOA: 10/26/2023 DOS: the patient was seen and examined on 10/26/2023 PCP: Mick Sell, MD  Patient coming from: Home  Chief Complaint:  Chief Complaint  Patient presents with   Abdominal Pain   HPI: Jamie [name] is a 57 y.o. female with medical history significant of left arm DVT, recent PE, remote splenic infarction during pregnancy, anxiety/depression presents for substernal chest tightness that radiates under her right breast.  Denies any recent shortness of breath.  Was admitted to the hospital for PE diagnosed on 10/17/23.  Has been taking her Lovenox shots and taking her warfarin. Notes that she has been more anxious recently and it unsettles her to have to stop her mood stabilizer as she was having good results with it.    ED initial vitals: Afebrile, tachycardic (HR 101), normotensive and satting well on room air.   ED course: Given Dilaudid 1 mg IV x 2, Zofran 4 mg IV, Zosyn ordered.  BMP and CBC unremarkable.  Troponins were 3 and 4.  Hepatic function panel showing elevated AST and ALT with normal total bilirubin and albumin.  Right upper quadrant ultrasound showed gallbladder sludge with positive Murphy sonographic sign.  Chest x-ray ordered and personally reviewed by me showing no focal pneumonia, pleural effusions or significant cardiomegaly.  Radiologist impression will be reviewed once available.  ED provider consulted hospitalist service to evaluate for admission.   Review of Systems: As mentioned in the history of present illness. All other systems reviewed and are negative. Past Medical History:  Diagnosis Date   Anxiety    Brachial neuritis    Cervical cancer (HCC)    Chronic neck pain    Chronic pain not due to malignancy    Depressive disorder    followed by Dr. Mordecai Rasmussen.   Disturbance, sleep    DVT (deep venous thrombosis) (HCC)    left arm   Elevated blood pressure  reading without diagnosis of hypertension    Embolism and thrombosis of splenic artery    H/O thrombophlebitis    Infarction of spleen    Lumbosacral neuritis    Obesity, Class II, BMI 35-39.9, with comorbidity    Past Surgical History:  Procedure Laterality Date   CESAREAN SECTION  09/24/2010   IVC FILTER INSERTION N/A 05/24/2021   Procedure: IVC FILTER INSERTION;  Surgeon: Annice Needy, MD;  Location: [facility] INVASIVE CV LAB;  Service: Cardiovascular;  Laterality: N/A;   IVC FILTER REMOVAL N/A 07/18/2021   Procedure: IVC FILTER REMOVAL;  Surgeon: Annice Needy, MD;  Location: [facility] INVASIVE CV LAB;  Service: Cardiovascular;  Laterality: N/A;   TONSILLECTOMY AND ADENOIDECTOMY  09/24/2010   TOTAL HIP ARTHROPLASTY Right 05/29/2021   Procedure: TOTAL HIP ARTHROPLASTY ANTERIOR APPROACH;  Surgeon: Kennedy Bucker, MD;  Location: [facility] ORS;  Service: Orthopedics;  Laterality: Right;   TUBAL LIGATION  09/24/2010   Social History:  reports that she has never smoked. She has never used smokeless tobacco. She reports current alcohol use. She reports that she does not currently use drugs after having used the following drugs: Marijuana.  Allergies  Allergen Reactions   Hydrocodone-Acetaminophen Nausea Only   Neuromuscular Blocking Agents Other (See Comments)    Don't work    Family History  Problem Relation Age of Onset   Anxiety disorder Mother    Depression Mother    Alcohol abuse Brother    Depression Daughter  Prior to Admission medications   Medication Sig Start Date End Date Taking? Authorizing Provider  acetaminophen (TYLENOL) 325 MG tablet Take 2 tablets (650 mg total) by mouth every 6 (six) hours as needed for mild pain (pain score 1-3), headache or fever. 10/21/23   Pennie Banter, DO  amitriptyline (ELAVIL) 100 MG tablet TAKE 1 TABLET BY MOUTH AT BEDTIME 08/14/23   Jomarie Longs, MD  amoxicillin-clavulanate (AUGMENTIN) 875-125 MG tablet Take 1 tablet by mouth 2 (two) times daily  for 6 days. 10/21/23 10/27/23  Pennie Banter, DO  Brexpiprazole (REXULTI) 3 MG TABS Take 1 tablet (3 mg total) by mouth daily with supper. 01/22/23   Jomarie Longs, MD  butalbital-acetaminophen-caffeine (FIORICET) 50-325-40 MG tablet Take 1 tablet by mouth every 6 (six) hours as needed (headache refractory to tylenol). 10/21/23   Esaw Grandchild A, DO  enoxaparin (LOVENOX) 80 MG/0.8ML injection Inject 0.7 mLs (70 mg total) into the skin every 12 (twelve) hours for 14 days. 10/21/23 11/04/23  Esaw Grandchild A, DO  gabapentin (NEURONTIN) 300 MG capsule Take 300 mg by mouth daily.    [provider]  hydrOXYzine (VISTARIL) 25 MG capsule Take 1 capsule (25 mg total) by mouth 3 (three) times daily as needed for anxiety. And sleep 10/22/23   Jomarie Longs, MD  ibuprofen (ADVIL) 200 MG tablet Take 400 mg by mouth every 6 (six) hours as needed for fever, cramping or mild pain (pain score 1-3). Patient not taking: Reported on 10/22/2023    [provider]  naloxone [facility]) nasal spray 4 mg/0.1 mL as needed. 10/21/23   [provider]  ondansetron (ZOFRAN) 4 MG tablet Take 1 tablet (4 mg total) by mouth every 6 (six) hours as needed for nausea. 10/21/23   Esaw Grandchild A, DO  oxyCODONE (OXY IR/ROXICODONE) 5 MG immediate release tablet Take 1-2 tablets (5-10 mg total) by mouth every 4 (four) hours as needed for moderate pain (pain score 4-6) or severe pain (pain score 7-10). 10/21/23   Pennie Banter, DO  polyethylene glycol (MIRALAX / GLYCOLAX) 17 g packet Take 17 g by mouth daily. 10/22/23   Esaw Grandchild A, DO  warfarin (COUMADIN) 2.5 MG tablet Take 1 tablet (2.5 mg total) by mouth one time only at 4 PM for 1 dose. On 10/23 10/22/23 10/23/23  Esaw Grandchild A, DO  warfarin (COUMADIN) 5 MG tablet Take 1 tablet (5 mg total) by mouth daily for 20 days. 10/22/23 11/11/23  Pennie Banter, DO    Physical Exam: Vitals:   10/26/23 1237 10/26/23 1330 10/26/23 1430  10/26/23 1531  BP:  125/85 117/80 (!) 152/78  Pulse:  86 78 62  Resp:  15 15 15   Temp:    98.7 F (37.1 C)  TempSrc:    Oral  SpO2:  98% 97% 98%  Weight: 69.9 kg     Height: 5\' 2"  (1.575 m)      GEN:     alert, tearful female  HENT:  mucus membranes moist, no nasal discharge  EYES:   pupils equal and reactive,  no scleral icterus NECK:  supple, good ROM  RESP:  clear to auscultation bilaterally, no increased work of breathing  CVS:   regular rate and rhythm, distal pulses intact   ABD:  soft, RUQ tenderness; bowel sounds present; no palpable masses EXT:   normal ROM, no edema  NEURO:  normal without focal findings,  speech normal, alert and oriented   Skin:   warm and  dry, no rash, normal skin turgor Psych:  Cognition and judgment appear intact. Alert, communicative  and cooperative with normal attention span and concentration. No apparent delusions, illusions, hallucinations  Data Reviewed:  Relevant notes from primary care and specialist visits, past discharge summaries as available in EHR, including Care Everywhere. Prior diagnostic testing as pertinent to current admission diagnoses Updated medications and problem lists for reconciliation ED course, including vitals, labs, imaging, treatment and response to treatment Triage notes, nursing and pharmacy notes and ED provider's notes Notable results as noted in HPI   Assessment and Plan: Principal Problem:   Right upper quadrant abdominal pain Active Problems:   MDD (major depressive disorder), recurrent episode, moderate (HCC)   GAD (generalized anxiety disorder)   Insomnia due to medical condition   Pulmonary emboli (HCC)   Right upper quadrant abdominal pain concern for acute cholecystitis ED provider discussed with Dr. Everlene Farrier, general surgery who recommended heparin for now and CT ABD and Zosyn  No surgery planned for today.  ED provider.  CT abdomen pelvis with contrast showing normal-appearing gallbladder without  bile duct dilation.  She had an MRCP on 10/18/2023 that showed biliary sludge with possibly obtaining gallstones in the gallbladder.  There was no acute cholecystitis on that MRI either. - Admit to inpatient on med-surg - General surgery consulted by ED physician, Dr Everlene Farrier to see  - Zofran IV PRN - Continue Zosyn  - Dilaudid and oxycodone for pain    Pulmonary emboli Satting well on room air. Hgb normal. Patient discharged on warfarin to Lovenox bridge until 11/04/2023 per discharge medications. -Lovenox 0.7 mL twice daily until 11/04/2023 -Warfarin 5 mg daily   Anxiety and depression with insomnia Uncontrolled. Discontinued Tegretol due to drug to drug interaction with warfarin. Pt reports uncontrolled anxiety since stopping Tegretol.   -Continue home Elavil, Rexulti and gabapentin - Start Vistaril as recommended by behavorial health provider - Follow up outpatient with behavioral health  - Chaplin consult, per pt request  Headache/migraine Stable.  - Fioricet every 6 hours as needed    Advance Care Planning:   Code Status: Full Code   Consults: General Surgery, Dr. Everlene Farrier  Family Communication: Non   Severity of Illness: The appropriate patient status for this patient is OBSERVATION. Observation status is judged to be reasonable and necessary in order to provide the required intensity of service to ensure the patient's safety. The patient's presenting symptoms, physical exam findings, and initial radiographic and laboratory data in the context of their medical condition is felt to place them at decreased risk for further clinical deterioration. Furthermore, it is anticipated that the patient will be medically stable for discharge from the hospital within 2 midnights of admission.   Author: Katha Cabal, DO 10/26/2023 6:19 PM  For on call review [email or web address].

## 2023-10-26 NOTE — ED Provider Notes (Signed)
[facility] Provider Note   Event Date/Time   First MD Initiated Contact with Patient 10/26/23 1242     (approximate) History  Abdominal Pain  HPI Jamie [name] is a 57 y.o. female who presents after recent admission for PE and cholecystitis who presents complaining of continued right upper abdominal pain.  Patient states that she is having associated nausea without vomiting.  Patient describes 10/10 aching pain that radiates through to her back on the right side. ROS: Patient currently denies any vision changes, tinnitus, difficulty speaking, facial droop, sore throat, chest pain, shortness of breath, vomiting/diarrhea, dysuria, or weakness/numbness/paresthesias in any extremity   Physical Exam  Triage Vital Signs: ED Triage Vitals  Encounter Vitals Group     BP 10/26/23 1236 127/86     Systolic BP Percentile --      Diastolic BP Percentile --      Pulse Rate 10/26/23 1236 (!) 101     Resp 10/26/23 1236 17     Temp 10/26/23 1236 98.2 F (36.8 C)     Temp Source 10/26/23 1236 Oral     SpO2 10/26/23 1236 99 %     Weight 10/26/23 1237 154 lb (69.9 kg)     Height 10/26/23 1237 5\' 2"  (1.575 m)     Head Circumference --      Peak Flow --      Pain Score 10/26/23 1237 10     Pain Loc --      Pain Education --      Exclude from Growth Chart --    Most recent vital signs: Vitals:   10/26/23 1330 10/26/23 1430  BP: 125/85 117/80  Pulse: 86 78  Resp: 15 15  Temp:    SpO2: 98% 97%   General: Awake, oriented x4. CV:  Good peripheral perfusion.  Resp:  Normal effort.  Abd:  No distention.  Right upper quadrant tenderness to palpation Other:  Middle-aged overweight Caucasian female resting comfortably in no acute distress ED Results / Procedures / Treatments  Labs (all labs ordered are listed, but only abnormal results are displayed) Labs Reviewed  BASIC METABOLIC PANEL - Abnormal; Notable for the following components:      Result Value   Glucose,  Bld 104 (*)    All other components within normal limits  HEPATIC FUNCTION PANEL - Abnormal; Notable for the following components:   AST 78 (*)    ALT 89 (*)    All other components within normal limits  CBC  TROPONIN I (HIGH SENSITIVITY)  TROPONIN I (HIGH SENSITIVITY)   EKG ED ECG REPORT I, Merwyn Katos, the attending physician, personally viewed and interpreted this ECG. Date: 10/26/2023 EKG Time: 1243 Rate: 93 Rhythm: normal sinus rhythm QRS Axis: normal Intervals: RBBB ST/T Wave abnormalities: normal Narrative Interpretation: RBBB. no evidence of acute ischemia RADIOLOGY ED MD interpretation: Pending -Agree with radiology assessment Official radiology report(s): No results found. PROCEDURES: Critical Care performed: No .1-3 Lead EKG Interpretation  Performed by: Merwyn Katos, MD Authorized by: Merwyn Katos, MD     Interpretation: normal     ECG rate:  71   ECG rate assessment: normal     Rhythm: sinus rhythm     Ectopy: none     Conduction: normal    MEDICATIONS ORDERED IN ED: Medications  HYDROmorphone (DILAUDID) injection 1 mg (1 mg Intravenous Given 10/26/23 1309)  ondansetron (ZOFRAN) injection 4 mg (4 mg Intravenous Given 10/26/23 1307)  HYDROmorphone (  DILAUDID) injection 1 mg (1 mg Intravenous Given 10/26/23 1430)   IMPRESSION / MDM / ASSESSMENT AND PLAN / ED COURSE  I reviewed the triage vital signs and the nursing notes.                             The patient is on the cardiac monitor to evaluate for evidence of arrhythmia and/or significant heart rate changes. Patient's presentation is most consistent with acute presentation with potential threat to life or bodily function. Patient's symptoms not typical for emergent causes of abdominal pain such as, but not limited to, appendicitis, abdominal aortic aneurysm, surgical biliary disease, pancreatitis, SBO, mesenteric ischemia, serious intra-abdominal bacterial illness. Presentation also not  typical of gynecologic emergencies such as TOA, Ovarian Torsion, PID. Not Ectopic. Doubt atypical ACS. Care of this patient will be signed out to the oncoming physician at the end of my shift.  All pertinent patient information conveyed and all questions answered.  All further care and disposition decisions will be made by the oncoming physician.   FINAL CLINICAL IMPRESSION(S) / ED DIAGNOSES   Final diagnoses:  Right upper quadrant abdominal pain   Rx / DC Orders   ED Discharge Orders     None      Note:  This document was prepared using Dragon voice recognition software and may include unintentional dictation errors.   Merwyn Katos, MD 10/26/23 [phone]

## 2023-10-26 NOTE — ED Notes (Signed)
EDP, Funke to bedside; update provided to patient.

## 2023-10-26 NOTE — ED Provider Notes (Signed)
3:54 PM Assumed care for off going team.   Blood pressure 117/80, pulse 78, temperature 98.2 F (36.8 C), temperature source Oral, resp. rate 15, height 5\' 2"  (1.575 m), weight 69.9 kg, last menstrual period 02/18/2018, SpO2 97%.  See their HPI for full report but in brief pending [place]  10/19:  IMPRESSION: 1. Study demonstrates a small amount of biliary sludge and/or tiny gallstones lying dependently in the gallbladder. No overt imaging findings suggestive of acute cholecystitis on today's MRI examination. 2. No choledocholithiasis or findings of biliary tract obstruction. 3. Additional incidental findings, as above.     4:11 PM Discussed the case with DR pabon-he recommended getting a CT scan to ensure that were not missing anything  else is going on starting on antibiotics and converting patient back to heparin in case she needs to have surgery to the hospitalist given he would not plan to do surgery today anyways.   CT scan ordered and is pending at time of hospitalist admission      Concha Se, MD 10/26/23 [phone]

## 2023-10-26 NOTE — ED Notes (Signed)
US at bedside

## 2023-10-26 NOTE — ED Notes (Signed)
Advised nurse that patient has ready bed 

## 2023-10-26 NOTE — ED Triage Notes (Signed)
Pt states coming in with chest tightness and pain under her rib cage. Pt states she has had since being in the hospital with a PE several days ago.

## 2023-10-26 NOTE — ED Notes (Signed)
Patient transported to CT 

## 2023-10-26 NOTE — Consult Note (Signed)
PHARMACY - ANTICOAGULATION CONSULT NOTE  Pharmacy Consult for Warfarin Indication: pulmonary embolus / Hx VTE  Patient Measurements: Height: 5\' 2"  (157.5 cm) Weight: 69.9 kg (154 lb) IBW/kg (Calculated) : 50.1  Labs: Recent Labs    10/26/23 1239 10/26/23 1435 10/26/23 1612  HGB 13.3  --   --   HCT 40.7  --   --   PLT 373  --   --   LABPROT  --   --  18.3*  INR  --   --  1.5*  CREATININE 0.57  --   --   TROPONINIHS 3 4  --     Estimated Creatinine Clearance: 71.9 mL/min (by C-G formula based on SCr of 0.57 mg/dL).   Medical History: Past Medical History:  Diagnosis Date   Anxiety    Brachial neuritis    Cervical cancer (HCC)    Chronic neck pain    Chronic pain not due to malignancy    Depressive disorder    followed by Dr. Mordecai Rasmussen.   Disturbance, sleep    DVT (deep venous thrombosis) (HCC)    left arm   Elevated blood pressure reading without diagnosis of hypertension    Embolism and thrombosis of splenic artery    H/O thrombophlebitis    Infarction of spleen    Lumbosacral neuritis    Obesity, Class II, BMI 35-39.9, with comorbidity     Prior to admission medications:  Warfarin 5 mg daily, appears patient has been taking this in the AM. Last patient reported dose 10/27 AM  Lovenox 70 mg (1 mg/kg) q12h, last patient reported dose was 10/26 PM  Assessment: 57 y/o F with medical history as above and recent admission for recurrent PE and acute cholecystitis. Patient was discharged on Augmentin and warfarin with Lovenox bridge.  Goal of Therapy:  INR 2-3 Monitor platelets by anticoagulation protocol: Yes   Plan:  --Continue Lovenox 70 mg (1 mg/kg) q12h given subtherapeutic INR --Continue warfarin 5 mg daily for now. INR is subtherapeutic and dose likely needs to be adjusted if patient has been compliant since discharge --Patient may need cholecystostomy tube so may consider holding warfarin depending on what surgery decides --Daily INR per  protocol  Tressie Ellis 10/26/2023,6:19 PM

## 2023-10-26 NOTE — Consult Note (Signed)
Pharmacy Antibiotic Note  Jamie [name] is a 57 y.o. female with medical history including splenic artery embolism, DVT, recently diagnosed PE admitted on 10/26/2023 with  abdominal pain and concern for cholecystitis .  Pharmacy has been consulted for Zosyn dosing.  Plan:  Zosyn 3.375g IV q8h (4 hour infusion).  Height: 5\' 2"  (157.5 cm) Weight: 69.9 kg (154 lb) IBW/kg (Calculated) : 50.1  Temp (24hrs), Avg:98.5 F (36.9 C), Min:98.2 F (36.8 C), Max:98.7 F (37.1 C)  Recent Labs  Lab 10/20/23 0407 10/21/23 0425 10/26/23 1239  WBC 6.2  --  8.4  CREATININE 0.72 0.65 0.57    Estimated Creatinine Clearance: 71.9 mL/min (by C-G formula based on SCr of 0.57 mg/dL).    Allergies  Allergen Reactions   Hydrocodone-Acetaminophen Nausea Only   Neuromuscular Blocking Agents Other (See Comments)    Don't work    Antimicrobials this admission: Zosyn 10/27 >>   Dose adjustments this admission: N/A  Microbiology results: N/A  Thank you for allowing pharmacy to be a part of this patient's care.  Tressie Ellis 10/26/2023 6:05 PM

## 2023-10-27 ENCOUNTER — Observation Stay
Admit: 2023-10-27 | Discharge: 2023-10-27 | Disposition: A | Discharge status: Home / Self Care | Payer: 59 | Attending: General Surgery | Admitting: General Surgery

## 2023-10-27 DIAGNOSIS — R1011 Right upper quadrant pain: Secondary | ICD-10-CM | POA: Diagnosis not present

## 2023-10-27 LAB — COMPREHENSIVE METABOLIC PANEL
ALT: 81 U/L — ABNORMAL HIGH (ref 0–44)
AST: 72 U/L — ABNORMAL HIGH (ref 15–41)
Albumin: 3.3 g/dL — ABNORMAL LOW (ref 3.5–5.0)
Alkaline Phosphatase: 76 U/L (ref 38–126)
Anion gap: 11 (ref 5–15)
BUN: 14 mg/dL (ref 6–20)
CO2: 26 mmol/L (ref 22–32)
Calcium: 8.8 mg/dL — ABNORMAL LOW (ref 8.9–10.3)
Chloride: 104 mmol/L (ref 98–111)
Creatinine, Ser: 0.71 mg/dL (ref 0.44–1.00)
GFR, Estimated: >60 mL/min (ref >60)
Glucose, Bld: 89 mg/dL (ref 70–99)
Potassium: 3.8 mmol/L (ref 3.5–5.1)
Sodium: 141 mmol/L (ref 135–145)
Total Bilirubin: 0.5 mg/dL (ref 0.3–1.2)
Total Protein: 6.3 g/dL — ABNORMAL LOW (ref 6.5–8.1)

## 2023-10-27 LAB — GLUCOSE, CAPILLARY: Glucose-Capillary: 88 mg/dL (ref 70–99)

## 2023-10-27 LAB — CBC
HCT: 35.8 % — ABNORMAL LOW (ref 36.0–46.0)
Hemoglobin: 12 g/dL (ref 12.0–15.0)
MCH: 30.6 pg (ref 26.0–34.0)
MCHC: 33.5 g/dL (ref 30.0–36.0)
MCV: 91.3 fL (ref 80.0–100.0)
Platelets: 325 10*3/uL (ref 150–400)
RBC: 3.92 MIL/uL (ref 3.87–5.11)
RDW: 12.9 % (ref 11.5–15.5)
WBC: 7.1 10*3/uL (ref 4.0–10.5)
nRBC: 0 % (ref 0.0–0.2)

## 2023-10-27 LAB — PROTIME-INR
INR: 1.8 — ABNORMAL HIGH (ref 0.8–1.2)
Prothrombin Time: 21.1 s — ABNORMAL HIGH (ref 11.4–15.2)

## 2023-10-27 MED ORDER — PROCHLORPERAZINE EDISYLATE 10 MG/2ML IJ SOLN
5.0000 mg | Freq: Four times a day (QID) | INTRAMUSCULAR | Status: DC | PRN
  Administered 2023-10-27: 5 mg via INTRAVENOUS
  Filled 2023-10-27: qty 2

## 2023-10-27 MED ORDER — OXYCODONE HCL 5 MG PO TABS: 5.0000 mg | ORAL_TABLET | ORAL | Status: DC | PRN

## 2023-10-27 MED ORDER — TECHNETIUM TC 99M MEBROFENIN IV KIT
5.0000 | PACK | Freq: Once | INTRAVENOUS | Status: AC | PRN
  Administered 2023-10-27: 4.84 via INTRAVENOUS

## 2023-10-27 MED ORDER — KETOROLAC TROMETHAMINE 15 MG/ML IJ SOLN: 15.0000 mg | Freq: Three times a day (TID) | INTRAMUSCULAR | Status: DC | PRN

## 2023-10-27 NOTE — Progress Notes (Signed)
Discharge instructions reviewed with patient including followup visits and medication changes.  Understanding was verbalized and all questions were answered.  IV removed without complication; patient tolerated well.  Patient currently awaiting ride.

## 2023-10-27 NOTE — Plan of Care (Signed)

## 2023-10-27 NOTE — Consult Note (Signed)
PHARMACY - ANTICOAGULATION CONSULT NOTE  Pharmacy Consult for Warfarin Indication: pulmonary embolus / Hx VTE  Patient Measurements: Height: 5\' 2"  (157.5 cm) Weight: 69.9 kg (154 lb) IBW/kg (Calculated) : 50.1  Labs: Recent Labs    10/26/23 1239 10/26/23 1435 10/26/23 1612 10/27/23 0459  HGB 13.3  --   --  12.0  HCT 40.7  --   --  35.8*  PLT 373  --   --  325  LABPROT  --   --  18.3* 21.1*  INR  --   --  1.5* 1.8*  CREATININE 0.57  --   --  0.71  TROPONINIHS 3 4  --   --     Estimated Creatinine Clearance: 71.9 mL/min (by C-G formula based on SCr of 0.71 mg/dL).   Medical History: Past Medical History:  Diagnosis Date   Anxiety    Brachial neuritis    Cervical cancer (HCC)    Chronic neck pain    Chronic pain not due to malignancy    Depressive disorder    followed by Dr. Mordecai Rasmussen.   Disturbance, sleep    DVT (deep venous thrombosis) (HCC)    left arm   Elevated blood pressure reading without diagnosis of hypertension    Embolism and thrombosis of splenic artery    H/O thrombophlebitis    Infarction of spleen    Lumbosacral neuritis    Obesity, Class II, BMI 35-39.9, with comorbidity     Prior to admission medications:  Warfarin 5 mg daily, appears patient has been taking this in the AM. Last patient reported dose 10/27 AM  Lovenox 70 mg (1 mg/kg) q12h, last patient reported dose was 10/26 PM  Assessment: 57 y/o F with medical history as above and recent admission for recurrent PE and acute cholecystitis. Patient was discharged on Augmentin and warfarin with Lovenox bridge.  Goal of Therapy:  INR 2-3 Monitor platelets by anticoagulation protocol: Yes   Plan:  --Continue Lovenox 70 mg (1 mg/kg) q12h given subtherapeutic INR --Continue warfarin 5 mg daily for now. INR is subtherapeutic but improving, dose may need to be adjusted if patient has been compliant since discharge. Will wait to see if INR continues to increase before making any  changes --Patient may need cholecystostomy tube so may consider holding warfarin depending on what surgery decides --Daily INR per protocol  Merryl Hacker, PharmD Clinical Pharmacist 10/27/2023,7:24 AM

## 2023-10-27 NOTE — TOC CM/SW Note (Signed)
Transition of Care [facility]) - Inpatient Brief Assessment   Patient Details  Name: KYNZLI CHOW MRN: [id number] Date of Birth: 1966/12/04  Transition of Care [facility]) CM/SW Contact:    Chapman Fitch, RN Phone Number: 10/27/2023, 9:46 AM   Clinical Narrative:   Transition of Care [facility]) Screening Note   Patient Details  Name: RANDYL LINWOOD Date of Birth: 1966-05-29   [facility]) CM/SW Contact:    Chapman Fitch, RN Phone Number: 10/27/2023, 9:46 AM    Transition of Care Department [facility]) has reviewed patient and no TOC needs have been identified at this time.  If new patient transition needs arise, please place a TOC consult.    Transition of Care Asessment: Insurance and Status: Insurance coverage has been reviewed Patient has primary care physician: Yes     Prior/Current Home Services: No current home services Social Determinants of Health Reivew: SDOH reviewed no interventions necessary Readmission risk has been reviewed: Yes Transition of care needs: no transition of care needs at this time

## 2023-10-27 NOTE — Discharge Summary (Signed)
Physician Discharge Summary   FLONNIE FELT  female DOB: [birth year]  WUJ:[id number]  PCP: Mick Sell, MD  Admit date: 10/26/2023 Discharge date: 10/27/2023  Admitted From: home Disposition:  home CODE STATUS: Full code   Hospital Course:  For full details, please see H&P, progress notes, consult notes and ancillary notes.  Briefly,  TARASA STECK is a 57 y.o. female with medical history significant of left arm DVT, recent PE, remote splenic infarction during pregnancy, anxiety/depression presented for substernal chest tightness that radiates under her right breast.    Was admitted to the hospital for PE diagnosed on 10/17/23.  Has been taking her Lovenox shots and taking her warfarin. Notes that she has been more anxious recently and it unsettles her to have to stop her mood stabilizer as she was having good results with it.    Right upper quadrant abdominal pain  acute cholecystitis ruled out CT abdomen pelvis with contrast showing No acute findings within the abdomen or pelvis, normal-appearing gallbladder without bile duct dilation.  US abdomen RUQ showed "Gallbladder sludge. No gallstones or gallbladder wall thickening.  Positive sonographic Murphy's sign reported."   --GenSurg consulted, rec HIDA scan, which showed No common duct or cystic duct obstruction.  Pt cleared for discharge. --The pain was palpable under right breast and over the rib cage, so possible MSK in etiology.   Recent Pulmonary emboli Satting well on room air. Hgb normal. Patient discharged on Lovenox bridge to warfarin until 11/04/2023 per discharge medications. -Lovenox 0.7 mL twice daily until 11/04/2023 -Warfarin 5 mg daily    Anxiety and depression with insomnia Uncontrolled. Discontinued Tegretol due to drug to drug interaction with warfarin. Pt reports uncontrolled anxiety since stopping Tegretol.   -Continue home Elavil, Rexulti and gabapentin - Start Vistaril as recommended by  behavorial health provider - Follow up outpatient with behavioral health    Hx of Headache/migraine Stable.  - cont home Fioricet    Discharge Diagnoses:  Principal Problem:   Right upper quadrant abdominal pain Active Problems:   MDD (major depressive disorder), recurrent episode, moderate (HCC)   GAD (generalized anxiety disorder)   Insomnia due to medical condition   Pulmonary emboli (HCC)   30 Day Unplanned Readmission Risk Score    Flowsheet Row ED to Hosp-Admission (Discharged) from 10/17/2023 in [facility]  30 Day Unplanned Readmission Risk Score (%) 10.93 Filed at 10/21/2023 1600       This score is the patient's risk of an unplanned readmission within 30 days of being discharged (0 -100%). The score is based on dignosis, age, lab data, medications, orders, and past utilization.   Low:  0-14.9   Medium: 15-21.9   High: 22-29.9   Extreme: 30 and above         Discharge Instructions:  Allergies as of 10/27/2023       Reactions   Hydrocodone-acetaminophen Nausea Only   Neuromuscular Blocking Agents Other (See Comments)   Don't work        Medication List     STOP taking these medications    amoxicillin-clavulanate 875-125 MG tablet Commonly known as: AUGMENTIN   ibuprofen 200 MG tablet Commonly known as: ADVIL       TAKE these medications    acetaminophen 325 MG tablet Commonly known as: TYLENOL Take 2 tablets (650 mg total) by mouth every 6 (six) hours as needed for mild pain (pain score 1-3), headache or fever.   amitriptyline 100 MG  tablet Commonly known as: ELAVIL TAKE 1 TABLET BY MOUTH AT BEDTIME   butalbital-acetaminophen-caffeine 50-325-40 MG tablet Commonly known as: FIORICET Take 1 tablet by mouth every 6 (six) hours as needed (headache refractory to tylenol).   enoxaparin 80 MG/0.8ML injection Commonly known as: LOVENOX Inject 0.7 mLs (70 mg total) into the skin every 12 (twelve) hours for  14 days.   gabapentin 300 MG capsule Commonly known as: NEURONTIN Take 300 mg by mouth daily.   hydrOXYzine 25 MG capsule Commonly known as: Vistaril Take 1 capsule (25 mg total) by mouth 3 (three) times daily as needed for anxiety. And sleep   naloxone 4 MG/0.1ML Liqd nasal spray kit Commonly known as: NARCAN as needed.   ondansetron 4 MG tablet Commonly known as: ZOFRAN Take 1 tablet (4 mg total) by mouth every 6 (six) hours as needed for nausea.   oxyCODONE 5 MG immediate release tablet Commonly known as: Oxy IR/ROXICODONE Take 1-2 tablets (5-10 mg total) by mouth every 4 (four) hours as needed for moderate pain (pain score 4-6) or severe pain (pain score 7-10).   polyethylene glycol 17 g packet Commonly known as: MIRALAX / GLYCOLAX Take 17 g by mouth daily.   Rexulti 3 MG Tabs Generic drug: Brexpiprazole Take 1 tablet (3 mg total) by mouth daily with supper.   warfarin 5 MG tablet Commonly known as: COUMADIN Take 1 tablet (5 mg total) by mouth daily for 20 days. What changed: Another medication with the same name was removed. Continue taking this medication, and follow the directions you see here.         Follow-up Information     Mick Sell, MD Follow up in 1 week(s).   Specialty: Infectious Diseases Contact information: [address] [place] [address] [phone]                 Allergies  Allergen Reactions   Hydrocodone-Acetaminophen Nausea Only   Neuromuscular Blocking Agents Other (See Comments)    Don't work     The results of significant diagnostics from this hospitalization (including imaging, microbiology, ancillary and laboratory) are listed below for reference.   Consultations:   Procedures/Studies: NM Hepatobiliary Liver Func  Result Date: 10/27/2023 CLINICAL DATA:  Right upper quadrant pain EXAM: NUCLEAR MEDICINE HEPATOBILIARY IMAGING TECHNIQUE: Sequential images of the abdomen were obtained out to 60  minutes following intravenous administration of radiopharmaceutical. RADIOPHARMACEUTICALS:  4.84 mCi Tc-41m  Choletec IV COMPARISON:  None Available. FINDINGS: Prompt uptake and biliary excretion of activity by the liver is seen. Gallbladder activity is visualized, consistent with patency of cystic duct. Biliary activity passes into small bowel, consistent with patent common bile duct. IMPRESSION: No common duct or cystic duct obstruction Electronically Signed   By: Karen Kays M.D.   On: 10/27/2023 15:18   CT ABDOMEN PELVIS W CONTRAST  Result Date: 10/26/2023 CLINICAL DATA:  Abdominal pain, nonlocalized. Recent admission for pulmonary embolism. Complains of persistent right upper quadrant abdominal pain with associated nausea. EXAM: CT ABDOMEN AND PELVIS WITH CONTRAST TECHNIQUE: Multidetector CT imaging of the abdomen and pelvis was performed using the standard protocol following bolus administration of intravenous contrast. RADIATION DOSE REDUCTION: This exam was performed according to the departmental dose-optimization program which includes automated exposure control, adjustment of the mA and/or kV according to patient size and/or use of iterative reconstruction technique. CONTRAST:  OMNIPAQUE IOHEXOL 300 MG/ML  SOLN COMPARISON:  MRI abdomen 10/18/2023 FINDINGS: Lower chest: Scarring noted within the left lower  lobe. No pleural effusion or consolidative change. Hepatobiliary: Several small, subcentimeter low-attenuation liver lesions identified within both lobes of liver. Technically too small to characterize but favored to represent small cysts. These appear similar to the previous MRI. No suspicious focal liver abnormality. Gallbladder appears normal. No bile duct dilatation. Pancreas: Unremarkable. No pancreatic ductal dilatation or surrounding inflammatory changes. Spleen: Unchanged appearance of the normal size spleen with adjacent accessory spleen. Adrenals/Urinary Tract: Adrenal glands appear  normal. Bilateral, subcentimeter kidney cysts are technically too small to reliably characterize but appear unchanged from previous exam. These are compatible with Bosniak class 2 cyst. No follow-up imaging recommended. No nephrolithiasis or obstructive uropathy. The bladder is normal. Stomach/Bowel: Stomach is normal. The appendix is visualized and appears normal. No pathologic dilatation of the large or small bowel loops. No bowel wall thickening or inflammatory changes identified. Vascular/Lymphatic: Normal appearance of the abdominal aorta. The upper abdominal vascularity is patent. No signs of abdominopelvic adenopathy. Reproductive: Uterus and the adnexal structures are unremarkable. Other: No ascites or focal fluid collection. No signs of pneumoperitoneum. Musculoskeletal: Right hip arthroplasty. No acute or suspicious osseous findings. IMPRESSION: 1. No acute findings within the abdomen or pelvis. No change from previous imaging. Electronically Signed   By: Signa Kell M.D.   On: 10/26/2023 17:06   DG Chest 2 View  Result Date: 10/26/2023 CLINICAL DATA:  Chest pain and tightness with pain under rib cage. Recent new diagnosis of acute pulmonary embolism. EXAM: CHEST - 2 VIEW COMPARISON:  None FINDINGS: Heart size and mediastinal contours appear normal. There is no pleural fluid, interstitial edema, or airspace disease. The visualized osseous structures appear unremarkable. IMPRESSION: No acute cardiopulmonary disease. Electronically Signed   By: Signa Kell M.D.   On: 10/26/2023 16:33   US ABDOMEN LIMITED RUQ (LIVER/GB)  Result Date: 10/26/2023 CLINICAL DATA:  Right upper quadrant pain for 2 weeks EXAM: ULTRASOUND ABDOMEN LIMITED RIGHT UPPER QUADRANT COMPARISON:  MRCP 10/18/2023 FINDINGS: Gallbladder: Mo bile gallbladder sludge noted. No stones. No pericholecystic fluid or gallbladder wall thickening. Positive sonographic Murphy's sign reported. Common bile duct: Diameter: 3.9 mm Liver: No  focal lesion identified. Within normal limits in parenchymal echogenicity. Portal vein is patent on color Doppler imaging with normal direction of blood flow towards the liver. Other: None. IMPRESSION: 1. Gallbladder sludge. No gallstones or gallbladder wall thickening. 2. Positive sonographic Murphy's sign reported. Electronically Signed   By: Signa Kell M.D.   On: 10/26/2023 15:32   MR ABDOMEN MRCP WO CONTRAST  Result Date: 10/18/2023 CLINICAL DATA:  57 year old female with history of right upper quadrant abdominal pain. EXAM: MRI ABDOMEN WITHOUT CONTRAST  (INCLUDING MRCP) TECHNIQUE: Multiplanar multisequence MR imaging of the abdomen was performed. Heavily T2-weighted images of the biliary and pancreatic ducts were obtained, and three-dimensional MRCP images were rendered by post processing. COMPARISON:  No prior abdominal MRI. Right upper quadrant abdominal ultrasound 10/17/2023. FINDINGS: Comment: Today's study is limited for detection and characterization of visceral and/or vascular lesions by lack of IV gadolinium. Lower chest: Unremarkable. Hepatobiliary: Subcentimeter T1 hypointense, T2 hyperintense lesions in the liver are incompletely characterized on today's noncontrast examination, but statistically likely to represent tiny cysts or biliary hamartomas (no imaging follow-up recommended). Gallbladder is moderately distended. Gallbladder wall thickness appears normal. No substantial volume of pericholecystic fluid noted. Tiny filling defects lying dependently in the gallbladder may represent biliary sludge and/or gallstones. No intra or extrahepatic biliary ductal dilatation. Common bile duct measures 5 mm in the porta hepatis. No filling defects  in the common bile duct on MRCP images to suggest choledocholithiasis. Pancreas: No definite pancreatic mass on today's noncontrast examination. No pancreatic ductal dilatation. No pancreatic or peripancreatic fluid collections or inflammatory changes.  Spleen:  Unremarkable. Adrenals/Urinary Tract: Several subcentimeter T1 hypointense, T2 hyperintense lesions in the kidneys are incompletely characterized on today's noncontrast examination, but statistically likely small cysts (no imaging follow-up recommended). No hydroureteronephrosis in the visualized portions of the abdomen. Bilateral adrenal glands are normal in appearance. Stomach/Bowel: Visualized portions are unremarkable. Vascular/Lymphatic: No aneurysm identified in the visualized abdominal vasculature. No lymphadenopathy noted in the abdomen. Other: No significant volume of ascites noted in the visualized portions of the peritoneal cavity. Musculoskeletal: No aggressive appearing osseous lesions are noted in the visualized portions of the skeleton. IMPRESSION: 1. Study demonstrates a small amount of biliary sludge and/or tiny gallstones lying dependently in the gallbladder. No overt imaging findings suggestive of acute cholecystitis on today's MRI examination. 2. No choledocholithiasis or findings of biliary tract obstruction. 3. Additional incidental findings, as above. Electronically Signed   By: Trudie Reed M.D.   On: 10/18/2023 10:47   MR 3D Recon At Scanner  Result Date: 10/18/2023 CLINICAL DATA:  57 year old female with history of right upper quadrant abdominal pain. EXAM: MRI ABDOMEN WITHOUT CONTRAST  (INCLUDING MRCP) TECHNIQUE: Multiplanar multisequence MR imaging of the abdomen was performed. Heavily T2-weighted images of the biliary and pancreatic ducts were obtained, and three-dimensional MRCP images were rendered by post processing. COMPARISON:  No prior abdominal MRI. Right upper quadrant abdominal ultrasound 10/17/2023. FINDINGS: Comment: Today's study is limited for detection and characterization of visceral and/or vascular lesions by lack of IV gadolinium. Lower chest: Unremarkable. Hepatobiliary: Subcentimeter T1 hypointense, T2 hyperintense lesions in the liver are  incompletely characterized on today's noncontrast examination, but statistically likely to represent tiny cysts or biliary hamartomas (no imaging follow-up recommended). Gallbladder is moderately distended. Gallbladder wall thickness appears normal. No substantial volume of pericholecystic fluid noted. Tiny filling defects lying dependently in the gallbladder may represent biliary sludge and/or gallstones. No intra or extrahepatic biliary ductal dilatation. Common bile duct measures 5 mm in the porta hepatis. No filling defects in the common bile duct on MRCP images to suggest choledocholithiasis. Pancreas: No definite pancreatic mass on today's noncontrast examination. No pancreatic ductal dilatation. No pancreatic or peripancreatic fluid collections or inflammatory changes. Spleen:  Unremarkable. Adrenals/Urinary Tract: Several subcentimeter T1 hypointense, T2 hyperintense lesions in the kidneys are incompletely characterized on today's noncontrast examination, but statistically likely small cysts (no imaging follow-up recommended). No hydroureteronephrosis in the visualized portions of the abdomen. Bilateral adrenal glands are normal in appearance. Stomach/Bowel: Visualized portions are unremarkable. Vascular/Lymphatic: No aneurysm identified in the visualized abdominal vasculature. No lymphadenopathy noted in the abdomen. Other: No significant volume of ascites noted in the visualized portions of the peritoneal cavity. Musculoskeletal: No aggressive appearing osseous lesions are noted in the visualized portions of the skeleton. IMPRESSION: 1. Study demonstrates a small amount of biliary sludge and/or tiny gallstones lying dependently in the gallbladder. No overt imaging findings suggestive of acute cholecystitis on today's MRI examination. 2. No choledocholithiasis or findings of biliary tract obstruction. 3. Additional incidental findings, as above. Electronically Signed   By: Trudie Reed M.D.   On:  10/18/2023 10:47   ECHOCARDIOGRAM COMPLETE  Result Date: 10/17/2023    ECHOCARDIOGRAM REPORT   Patient Name:   DEILYN RAGATZ Date of Exam: 10/17/2023 Medical Rec #:  [id number]       Height:  62.0 in Accession #:    [id number]      Weight:       154.0 lb Date of Birth:  09-24-1966       BSA:          1.711 m Patient Age:    57 years        BP:           134/88 mmHg Patient Gender: F               HR:           64 bpm. Exam Location:  [facility] Procedure: 2D Echo, Cardiac Doppler and Color Doppler Indications:     Pulmonary embouls  History:         Patient has no prior history of Echocardiogram examinations.                  Risk Factors:Hypertension.  Sonographer:     Mikki Harbor Referring Phys:  [id number] Emeline General Diagnosing Phys: Debbe Odea MD IMPRESSIONS  1. Left ventricular ejection fraction, by estimation, is 60 to 65%. The left ventricle has normal function. The left ventricle has no regional wall motion abnormalities. Left ventricular diastolic parameters were normal.  2. Right ventricular systolic function is normal. The right ventricular size is normal.  3. The mitral valve is normal in structure. Mild mitral valve regurgitation.  4. The aortic valve is tricuspid. Aortic valve regurgitation is not visualized. Aortic valve sclerosis is present, with no evidence of aortic valve stenosis.  5. The inferior vena cava is normal in size with greater than 50% respiratory variability, suggesting right atrial pressure of 3 mmHg. FINDINGS  Left Ventricle: Left ventricular ejection fraction, by estimation, is 60 to 65%. The left ventricle has normal function. The left ventricle has no regional wall motion abnormalities. The left ventricular internal cavity size was normal in size. There is  no left ventricular hypertrophy. Left ventricular diastolic parameters were normal. Right Ventricle: The right ventricular size is normal. No increase in right ventricular wall thickness. Right ventricular  systolic function is normal. Left Atrium: Left atrial size was normal in size. Right Atrium: Right atrial size was normal in size. Pericardium: There is no evidence of pericardial effusion. Mitral Valve: The mitral valve is normal in structure. Mild mitral valve regurgitation. MV peak gradient, 2.4 mmHg. The mean mitral valve gradient is 1.0 mmHg. Tricuspid Valve: The tricuspid valve is normal in structure. Tricuspid valve regurgitation is trivial. Aortic Valve: The aortic valve is tricuspid. Aortic valve regurgitation is not visualized. Aortic valve sclerosis is present, with no evidence of aortic valve stenosis. Aortic valve mean gradient measures 3.0 mmHg. Aortic valve peak gradient measures 6.9  mmHg. Aortic valve area, by VTI measures 2.69 cm. Pulmonic Valve: The pulmonic valve was normal in structure. Pulmonic valve regurgitation is trivial. Aorta: The aortic root is normal in size and structure. Venous: The inferior vena cava is normal in size with greater than 50% respiratory variability, suggesting right atrial pressure of 3 mmHg. IAS/Shunts: No atrial level shunt detected by color flow Doppler.  LEFT VENTRICLE PLAX 2D LVIDd:         5.00 cm   Diastology LVIDs:         2.50 cm   LV e' medial:    7.62 cm/s LV PW:         0.90 cm   LV E/e' medial:  8.0 LV IVS:        0.90  cm   LV e' lateral:   12.70 cm/s LVOT diam:     2.10 cm   LV E/e' lateral: 4.8 LV SV:         78 LV SV Index:   45 LVOT Area:     3.46 cm  RIGHT VENTRICLE RV Basal diam:  3.00 cm RV Mid diam:    2.90 cm RV S prime:     14.70 cm/s TAPSE (M-mode): 2.5 cm LEFT ATRIUM           Index        RIGHT ATRIUM           Index LA diam:      3.80 cm 2.22 cm/m   RA Area:     14.20 cm LA Vol (A2C): 52.2 ml 30.51 ml/m  RA Volume:   30.90 ml  18.06 ml/m LA Vol (A4C): 40.8 ml 23.85 ml/m  AORTIC VALVE                    PULMONIC VALVE AV Area (Vmax):    2.75 cm     PV Vmax:       0.83 m/s AV Area (Vmean):   2.50 cm     PV Peak grad:  2.8 mmHg AV Area  (VTI):     2.69 cm AV Vmax:           131.00 cm/s AV Vmean:          84.500 cm/s AV VTI:            0.288 m AV Peak Grad:      6.9 mmHg AV Mean Grad:      3.0 mmHg LVOT Vmax:         104.00 cm/s LVOT Vmean:        60.900 cm/s LVOT VTI:          0.224 m LVOT/AV VTI ratio: 0.78  AORTA Ao Root diam: 3.10 cm MITRAL VALVE MV Area (PHT): 3.34 cm    SHUNTS MV Area VTI:   3.15 cm    Systemic VTI:  0.22 m MV Peak grad:  2.4 mmHg    Systemic Diam: 2.10 cm MV Mean grad:  1.0 mmHg MV Vmax:       0.77 m/s MV Vmean:      46.1 cm/s MV Decel Time: 227 msec MV E velocity: 60.80 cm/s MV A velocity: 64.70 cm/s MV E/A ratio:  0.94 Debbe Odea MD Electronically signed by Debbe Odea MD Signature Date/Time: 10/17/2023/4:17:28 PM    Final    US Venous Img Lower Bilateral (DVT)  Result Date: 10/17/2023 CLINICAL DATA:  Pulmonary embolism. History of previous DVT. Evaluate for acute or chronic DVT. EXAM: BILATERAL LOWER EXTREMITY VENOUS DOPPLER ULTRASOUND TECHNIQUE: Gray-scale sonography with graded compression, as well as color Doppler and duplex ultrasound were performed to evaluate the lower extremity deep venous systems from the level of the common femoral vein and including the common femoral, femoral, profunda femoral, popliteal and calf veins including the posterior tibial, peroneal and gastrocnemius veins when visible. The superficial great saphenous vein was also interrogated. Spectral Doppler was utilized to evaluate flow at rest and with distal augmentation maneuvers in the common femoral, femoral and popliteal veins. COMPARISON:  Chest CT- 10/17/2023 FINDINGS: RIGHT LOWER EXTREMITY Common Femoral Vein: No evidence of thrombus. Normal compressibility, respiratory phasicity and response to augmentation. Saphenofemoral Junction: No evidence of thrombus. Normal compressibility and flow on color Doppler imaging. Profunda Femoral Vein:  No evidence of thrombus. Normal compressibility and flow on color Doppler  imaging. Femoral Vein: No evidence of thrombus. Normal compressibility, respiratory phasicity and response to augmentation. Popliteal Vein: No evidence of thrombus. Normal compressibility, respiratory phasicity and response to augmentation. Calf Veins: No evidence of thrombus. Normal compressibility and flow on color Doppler imaging. Superficial Great Saphenous Vein: No evidence of thrombus. Normal compressibility. Other Findings:  None. LEFT LOWER EXTREMITY Common Femoral Vein: No evidence of thrombus. Normal compressibility, respiratory phasicity and response to augmentation. Saphenofemoral Junction: No evidence of thrombus. Normal compressibility and flow on color Doppler imaging. Profunda Femoral Vein: No evidence of thrombus. Normal compressibility and flow on color Doppler imaging. Femoral Vein: No evidence of thrombus. Normal compressibility, respiratory phasicity and response to augmentation. Popliteal Vein: No evidence of thrombus. Normal compressibility, respiratory phasicity and response to augmentation. Calf Veins: No evidence of thrombus. Normal compressibility and flow on color Doppler imaging. Superficial Great Saphenous Vein: No evidence of thrombus. Normal compressibility. Other Findings:  None. IMPRESSION: No evidence of acute or chronic DVT within either lower extremity. Electronically Signed   By: Simonne Come M.D.   On: 10/17/2023 10:43   CT Angio Chest PE W and/or Wo Contrast  Result Date: 10/17/2023 CLINICAL DATA:  Chest pain with positive D-dimer. Patient states pain is 8/10. EXAM: CT ANGIOGRAPHY CHEST WITH CONTRAST TECHNIQUE: Multidetector CT imaging of the chest was performed using the standard protocol during bolus administration of intravenous contrast. Multiplanar CT image reconstructions and MIPs were obtained to evaluate the vascular anatomy. RADIATION DOSE REDUCTION: This exam was performed according to the departmental dose-optimization program which includes automated exposure  control, adjustment of the mA and/or kV according to patient size and/or use of iterative reconstruction technique. CONTRAST:  75mL OMNIPAQUE IOHEXOL 350 MG/ML SOLN COMPARISON:  Portable chest today, chest CT with contrast 09/19/2009. FINDINGS: Cardiovascular: There is acute near occlusive thrombus in the right upper lobe apical segmental artery and with extension into the 2 of its branch arteries, with additional subsegmental thrombus in the posterior segment of the right upper lobe (series 4 images 45-50; and images 43-45). This represents a small clot burden, without findings of right heart strain, without other visible emboli bilaterally. The pulmonary trunk and main arteries are normal caliber. There is mild cardiomegaly, with a left chamber predominance. There is no pericardial effusion. The pulmonary veins are mildly prominent. There is scattered calcific plaque in the LAD coronary artery only. The aorta and great vessels are unremarkable apart from mild descending aortic tortuosity. No visible plaques. Mediastinum/Nodes: No enlarged mediastinal, hilar, or axillary lymph nodes. Thyroid gland, trachea, and esophagus demonstrate no significant findings. Both main bronchi are patent. Lungs/Pleura: No pleural effusion, thickening or pneumothorax. The bronchi are mildly thickened. Again noted is a mildly elevated right hemidiaphragm. There is scattered linear scarring or atelectasis in the left lower lobe. The lungs are clear of infiltrates and nodules. Upper Abdomen: Single small stone in the posterior gallbladder. Mild hepatic steatosis. Gallbladder is not fully seen. Musculoskeletal: Osteopenia, thoracic kyphosis and multilevel degenerative discs with multilevel bridging enthesopathy. No acute or other significant osseous findings. The ribcage is intact. Review of the MIP images confirms the above findings. IMPRESSION: 1. Acute near occlusive thrombus in the right upper lobe apical segmental artery with  extension into the 2 of its branch arteries, and additional subsegmental thrombus in the posterior segment of the right upper lobe. 2. Small overall clot burden with no findings of acute right heart strain. 3. Mild cardiomegaly  with left chamber predominance and mildly prominent pulmonary veins. No pulmonary edema or pleural effusion. 4. Bronchitis without evidence of pneumonia. 5. Cholelithiasis. 6. Hepatic steatosis. 7. Osteopenia, kyphosis and degenerative change. 8. Critical Value/emergent results were called by telephone at the time of interpretation on 10/17/2023 at 7:20 am to provider DR. ISAACS, who verbally acknowledged these results. Electronically Signed   By: Almira Bar M.D.   On: 10/17/2023 07:20   US ABDOMEN LIMITED RUQ (LIVER/GB)  Result Date: 10/17/2023 CLINICAL DATA:  Chest and abdomen pain. [id number] with right upper quadrant abdomen pain. EXAM: ULTRASOUND ABDOMEN LIMITED RIGHT UPPER QUADRANT COMPARISON:  CT with IV contrast 10/12/2009. FINDINGS: Gallbladder: There is sludge and a few tiny layering stones in the gallbladder. The gallbladder is dilated to 12 cm in length with thickening over portions up to 7.9 mm, positive sonographic Murphy's sign, and pericholecystic fluid. Findings are worrisome for acute cholecystitis. Common bile duct: Diameter: 5.1 mm, within normal limits. No intrahepatic biliary prominence. Liver: No focal lesion identified. There is mild increased parenchymal echogenicity consistent with steatosis. Portal vein is patent on color Doppler imaging with normal direction of blood flow towards the liver. Other: None. IMPRESSION: 1. Findings worrisome for acute cholecystitis. There is sludge and a few tiny stones in the gallbladder. Surgical consult recommended. 2. Hepatic steatosis. Electronically Signed   By: Almira Bar M.D.   On: 10/17/2023 07:03   DG Chest Port 1 View  Result Date: 10/17/2023 CLINICAL DATA:  57 year old female with history of chest pain. EXAM:  PORTABLE CHEST 1 VIEW COMPARISON:  No priors. FINDINGS: Lung volumes are normal. No consolidative airspace disease. No pleural effusions. No pneumothorax. No pulmonary nodule or mass noted. Pulmonary vasculature and the cardiomediastinal silhouette are within normal limits. IMPRESSION: No radiographic evidence of acute cardiopulmonary disease. Electronically Signed   By: Trudie Reed M.D.   On: 10/17/2023 06:24      Labs: BNP (last 3 results) No results for input(s): "BNP" in the last 8760 hours. Basic Metabolic Panel: Recent Labs  Lab 10/21/23 0425 10/26/23 1239 10/27/23 0459  NA 139 142 141  K 3.4* 4.1 3.8  CL 102 110 104  CO2 29 24 26   GLUCOSE 82 104* 89  BUN 10 12 14   CREATININE 0.65 0.57 0.71  CALCIUM 8.6* 9.0 8.8*   Liver Function Tests: Recent Labs  Lab 10/26/23 1352 10/27/23 0459  AST 78* 72*  ALT 89* 81*  ALKPHOS 91 76  BILITOT 0.4 0.5  PROT 7.3 6.3*  ALBUMIN 3.8 3.3*   No results for input(s): "LIPASE", "AMYLASE" in the last 168 hours. No results for input(s): "AMMONIA" in the last 168 hours. CBC: Recent Labs  Lab 10/26/23 1239 10/27/23 0459  WBC 8.4 7.1  HGB 13.3 12.0  HCT 40.7 35.8*  MCV 92.5 91.3  PLT 373 325   Cardiac Enzymes: No results for input(s): "CKTOTAL", "CKMB", "CKMBINDEX", "TROPONINI" in the last 168 hours. BNP: Invalid input(s): "POCBNP" CBG: Recent Labs  Lab 10/27/23 0744  GLUCAP 88   D-Dimer No results for input(s): "DDIMER" in the last 72 hours. Hgb A1c No results for input(s): "HGBA1C" in the last 72 hours. Lipid Profile No results for input(s): "CHOL", "HDL", "LDLCALC", "TRIG", "CHOLHDL", "LDLDIRECT" in the last 72 hours. Thyroid function studies No results for input(s): "TSH", "T4TOTAL", "T3FREE", "THYROIDAB" in the last 72 hours.  Invalid input(s): "FREET3" Anemia work up No results for input(s): "VITAMINB12", "FOLATE", "FERRITIN", "TIBC", "IRON", "RETICCTPCT" in the last 72 hours. Urinalysis  Component Value  Date/Time   COLORURINE YELLOW (A) 05/21/2021 1021   APPEARANCEUR HAZY (A) 05/21/2021 1021   LABSPEC 1.018 05/21/2021 1021   PHURINE 7.0 05/21/2021 1021   GLUCOSEU NEGATIVE 05/21/2021 1021   HGBUR NEGATIVE 05/21/2021 1021   BILIRUBINUR NEGATIVE 05/21/2021 1021   KETONESUR NEGATIVE 05/21/2021 1021   PROTEINUR NEGATIVE 05/21/2021 1021   NITRITE NEGATIVE 05/21/2021 1021   LEUKOCYTESUR NEGATIVE 05/21/2021 1021   Sepsis Labs Recent Labs  Lab 10/26/23 1239 10/27/23 0459  WBC 8.4 7.1   Microbiology No results found for this or any previous visit (from the past 240 hour(s)).   Total time spend on discharging this patient, including the last patient exam, discussing the hospital stay, instructions for ongoing care as it relates to all pertinent caregivers, as well as preparing the medical discharge records, prescriptions, and/or referrals as applicable, is 40 minutes.    Darlin Priestly, MD  [facility] 10/27/2023, 4:26 PM

## 2023-10-27 NOTE — Progress Notes (Signed)
Off floor to Nuc Med for procedure

## 2023-10-27 NOTE — Progress Notes (Signed)
   10/27/23 1000  Spiritual Encounters  Type of Visit Initial;Attempt (pt unavailable)  Referral source Patient request  OnCall Visit No   Chaplain attempted to visit with the patient but she was sleeping when the chaplain arrived. Chaplain services remain available for support. Chaplain will follow up later.

## 2023-10-27 NOTE — Consult Note (Signed)
SURGICAL CONSULTATION NOTE   HISTORY OF PRESENT ILLNESS (HPI):  57 y.o. female presented to [facility] ED for evaluation of chest pain and right upper quadrant pain since she was admitted recently for cholecystitis. Patient reports she has been having on and off chest pain and right upper quadrant pain.  She endorses that the pain go up to her chest.  Pain is exacerbated by taking deep breath.  Alleviating factors with medications.  Patient endorses that the pain is not associated with eating.  In the ED she was found with normal white blood cell count, normal hemoglobin.  Normal bilirubin and alkaline phosphatase.  There was tenderness to palpation in the right upper quadrant.  She had an ultrasound of the abdomen that shows gallbladder sludge without gallbladder wall thickening or pericholecystic fluid.  Ultrasound technologist reported positive Murphy sign.  She had a CT scan of the abdomen pelvis without any acute findings.  I personally evaluated the images of the ultrasound and the CT scan.  Surgery is consulted by Dr. Fran Lowes in this context for evaluation and management of suspected cholecystitis.  PAST MEDICAL HISTORY (PMH):  Past Medical History:  Diagnosis Date   Anxiety    Brachial neuritis    Cervical cancer (HCC)    Chronic neck pain    Chronic pain not due to malignancy    Depressive disorder    followed by Dr. Mordecai Rasmussen.   Disturbance, sleep    DVT (deep venous thrombosis) (HCC)    left arm   Elevated blood pressure reading without diagnosis of hypertension    Embolism and thrombosis of splenic artery    H/O thrombophlebitis    Infarction of spleen    Lumbosacral neuritis    Obesity, Class II, BMI 35-39.9, with comorbidity      PAST SURGICAL HISTORY (PSH):  Past Surgical History:  Procedure Laterality Date   CESAREAN SECTION  09/24/2010   IVC FILTER INSERTION N/A 05/24/2021   Procedure: IVC FILTER INSERTION;  Surgeon: Annice Needy, MD;  Location: [facility] INVASIVE CV LAB;   Service: Cardiovascular;  Laterality: N/A;   IVC FILTER REMOVAL N/A 07/18/2021   Procedure: IVC FILTER REMOVAL;  Surgeon: Annice Needy, MD;  Location: [facility] INVASIVE CV LAB;  Service: Cardiovascular;  Laterality: N/A;   TONSILLECTOMY AND ADENOIDECTOMY  09/24/2010   TOTAL HIP ARTHROPLASTY Right 05/29/2021   Procedure: TOTAL HIP ARTHROPLASTY ANTERIOR APPROACH;  Surgeon: Kennedy Bucker, MD;  Location: [facility] ORS;  Service: Orthopedics;  Laterality: Right;   TUBAL LIGATION  09/24/2010     MEDICATIONS:  Prior to Admission medications   Medication Sig Start Date End Date Taking? Authorizing Provider  amitriptyline (ELAVIL) 100 MG tablet TAKE 1 TABLET BY MOUTH AT BEDTIME 08/14/23  Yes Eappen, Levin Bacon, MD  amoxicillin-clavulanate (AUGMENTIN) 875-125 MG tablet Take 1 tablet by mouth 2 (two) times daily for 6 days. 10/21/23 10/27/23 Yes Esaw Grandchild A, DO  Brexpiprazole (REXULTI) 3 MG TABS Take 1 tablet (3 mg total) by mouth daily with supper. 01/22/23  Yes Jomarie Longs, MD  butalbital-acetaminophen-caffeine (FIORICET) 50-325-40 MG tablet Take 1 tablet by mouth every 6 (six) hours as needed (headache refractory to tylenol). 10/21/23  Yes Esaw Grandchild A, DO  enoxaparin (LOVENOX) 80 MG/0.8ML injection Inject 0.7 mLs (70 mg total) into the skin every 12 (twelve) hours for 14 days. 10/21/23 11/04/23 Yes Esaw Grandchild A, DO  gabapentin (NEURONTIN) 300 MG capsule Take 300 mg by mouth daily.   Yes [provider]  naloxone [facility]) nasal  spray 4 mg/0.1 mL as needed. 10/21/23  Yes [provider]  ondansetron (ZOFRAN) 4 MG tablet Take 1 tablet (4 mg total) by mouth every 6 (six) hours as needed for nausea. 10/21/23  Yes Esaw Grandchild A, DO  oxyCODONE (OXY IR/ROXICODONE) 5 MG immediate release tablet Take 1-2 tablets (5-10 mg total) by mouth every 4 (four) hours as needed for moderate pain (pain score 4-6) or severe pain (pain score 7-10). 10/21/23  Yes Esaw Grandchild A, DO  polyethylene  glycol (MIRALAX / GLYCOLAX) 17 g packet Take 17 g by mouth daily. 10/22/23  Yes Esaw Grandchild A, DO  warfarin (COUMADIN) 5 MG tablet Take 1 tablet (5 mg total) by mouth daily for 20 days. 10/22/23 11/11/23 Yes Pennie Banter, DO  acetaminophen (TYLENOL) 325 MG tablet Take 2 tablets (650 mg total) by mouth every 6 (six) hours as needed for mild pain (pain score 1-3), headache or fever. 10/21/23   Pennie Banter, DO  hydrOXYzine (VISTARIL) 25 MG capsule Take 1 capsule (25 mg total) by mouth 3 (three) times daily as needed for anxiety. And sleep 10/22/23   Jomarie Longs, MD  ibuprofen (ADVIL) 200 MG tablet Take 400 mg by mouth every 6 (six) hours as needed for fever, cramping or mild pain (pain score 1-3). Patient not taking: Reported on 10/22/2023    [provider]  warfarin (COUMADIN) 2.5 MG tablet Take 1 tablet (2.5 mg total) by mouth one time only at 4 PM for 1 dose. On 10/23 10/22/23 10/23/23  Pennie Banter, DO     ALLERGIES:  Allergies  Allergen Reactions   Hydrocodone-Acetaminophen Nausea Only   Neuromuscular Blocking Agents Other (See Comments)    Don't work     SOCIAL HISTORY:  Social History   Socioeconomic History   Marital status: Married    Spouse name: rodney   Number of children: 3   Years of education: Not on file   Highest education level: Some college, no degree  Occupational History    Comment: full time  Tobacco Use   Smoking status: Never   Smokeless tobacco: Never  Vaping Use   Vaping status: Never Used  Substance and Sexual Activity   Alcohol use: Yes    Alcohol/week: 0.0 standard drinks of alcohol    Comment: social   Drug use: Not Currently    Types: Marijuana   Sexual activity: Yes    Partners: Male    Birth control/protection: Surgical    Comment: Tubal ligation   Other Topics Concern   Not on file  Social History Narrative   Not on file   Social Determinants of Health   Financial Resource Strain: Medium Risk (02/03/2018)    Overall Financial Resource Strain (CARDIA)    Difficulty of Paying Living Expenses: Somewhat hard  Food Insecurity: No Food Insecurity (10/26/2023)   Hunger Vital Sign    Worried About Running Out of Food in the Last Year: Never true    Ran Out of Food in the Last Year: Never true  Transportation Needs: No Transportation Needs (10/27/2023)   PRAPARE - Administrator, Civil Service (Medical): No    Lack of Transportation (Non-Medical): No  Physical Activity: Inactive (02/03/2018)   Exercise Vital Sign    Days of Exercise per Week: 0 days    Minutes of Exercise per Session: 0 min  Stress: Stress Concern Present (02/03/2018)   [employer] of Occupational Health - Occupational Stress Questionnaire    Feeling  of Stress : Very much  Social Connections: Somewhat Isolated (02/03/2018)   Social Connection and Isolation Panel [NHANES]    Frequency of Communication with Friends and Family: More than three times a week    Frequency of Social Gatherings with Friends and Family: Never    Attends Religious Services: Never    Database administrator or Organizations: No    Attends Banker Meetings: Never    Marital Status: Married  Catering manager Violence: Not At Risk (10/26/2023)   Humiliation, Afraid, Rape, and Kick questionnaire    Fear of Current or Ex-Partner: No    Emotionally Abused: No    Physically Abused: No    Sexually Abused: No      FAMILY HISTORY:  Family History  Problem Relation Age of Onset   Anxiety disorder Mother    Depression Mother    Alcohol abuse Brother    Depression Daughter      REVIEW OF SYSTEMS:  Constitutional: denies weight loss, fever, chills, or sweats  Eyes: denies any other vision changes, history of eye injury  ENT: denies sore throat, hearing problems  Respiratory: denies shortness of breath, wheezing  Cardiovascular: Positive chest pain  Gastrointestinal: Positive abdominal pain Genitourinary: denies burning with  urination or urinary frequency Musculoskeletal: denies any other joint pains or cramps  Skin: denies any other rashes or skin discolorations  Neurological: denies any other headache, dizziness, weakness  Psychiatric: denies any other depression, anxiety   All other review of systems were negative   VITAL SIGNS:  Temp:  [98.2 F (36.8 C)-98.7 F (37.1 C)] 98.3 F (36.8 C) (10/27 1925) Pulse Rate:  [62-101] 71 (10/28 0300) Resp:  [15-18] 18 (10/28 0300) BP: (101-152)/(59-86) 101/59 (10/28 0300) SpO2:  [97 %-100 %] 98 % (10/28 0300) Weight:  [69.9 kg] 69.9 kg (10/27 1237)     Height: 5\' 2"  (157.5 cm) Weight: 69.9 kg BMI (Calculated): 28.16   INTAKE/OUTPUT:  This shift: No intake/output data recorded.  Last 2 shifts: @IOLAST2SHIFTS @   PHYSICAL EXAM:  Constitutional:  -- Normal body habitus  -- Awake, alert, and oriented x3  Eyes:  -- Pupils equally round and reactive to light  -- No scleral icterus  Ear, nose, and throat:  -- No jugular venous distension  Pulmonary:  -- No crackles  -- Equal breath sounds bilaterally -- Breathing non-labored at rest Cardiovascular:  -- S1, S2 present  -- No pericardial rubs Gastrointestinal:  -- Abdomen soft, nontender, non-distended, no guarding or rebound tenderness -- No abdominal masses appreciated, pulsatile or otherwise  Musculoskeletal and Integumentary:  -- Wounds: None appreciated -- Extremities: B/L UE and LE FROM, hands and feet warm, no edema  Neurologic:  -- Motor function: intact and symmetric -- Sensation: intact and symmetric   Labs:     Latest Ref Rng & Units 10/27/2023    4:59 AM 10/26/2023   12:39 PM 10/20/2023    4:07 AM  CBC  WBC 4.0 - 10.5 K/uL 7.1  8.4  6.2   Hemoglobin 12.0 - 15.0 g/dL 69.6  29.5  28.4   Hematocrit 36.0 - 46.0 % 35.8  40.7  34.3   Platelets 150 - 400 K/uL 325  373  282       Latest Ref Rng & Units 10/27/2023    4:59 AM 10/26/2023    1:52 PM 10/26/2023   12:39 PM  CMP  Glucose 70  - 99 mg/dL 89   132   BUN 6 - 20  mg/dL 14   12   Creatinine 4.78 - 1.00 mg/dL 2.95   6.21   Sodium 308 - 145 mmol/L 141   142   Potassium 3.5 - 5.1 mmol/L 3.8   4.1   Chloride 98 - 111 mmol/L 104   110   CO2 22 - 32 mmol/L 26   24   Calcium 8.9 - 10.3 mg/dL 8.8   9.0   Total Protein 6.5 - 8.1 g/dL 6.3  7.3    Total Bilirubin 0.3 - 1.2 mg/dL 0.5  0.4    Alkaline Phos 38 - 126 U/L 76  91    AST 15 - 41 U/L 72  78    ALT 0 - 44 U/L 81  89       Imaging studies:  EXAM: ULTRASOUND ABDOMEN LIMITED RIGHT UPPER QUADRANT   COMPARISON:  MRCP 10/18/2023   FINDINGS: Gallbladder:   Mo bile gallbladder sludge noted. No stones. No pericholecystic fluid or gallbladder wall thickening. Positive sonographic Murphy's sign reported.   Common bile duct:   Diameter: 3.9 mm   Liver:   No focal lesion identified. Within normal limits in parenchymal echogenicity. Portal vein is patent on color Doppler imaging with normal direction of blood flow towards the liver.   Other: None.   IMPRESSION: 1. Gallbladder sludge. No gallstones or gallbladder wall thickening. 2. Positive sonographic Murphy's sign reported.     Electronically Signed   By: Signa Kell M.D.   On: 10/26/2023 15:32  EXAM: CT ABDOMEN AND PELVIS WITH CONTRAST   TECHNIQUE: Multidetector CT imaging of the abdomen and pelvis was performed using the standard protocol following bolus administration of intravenous contrast.   RADIATION DOSE REDUCTION: This exam was performed according to the departmental dose-optimization program which includes automated exposure control, adjustment of the mA and/or kV according to patient size and/or use of iterative reconstruction technique.   CONTRAST:  OMNIPAQUE IOHEXOL 300 MG/ML  SOLN   COMPARISON:  MRI abdomen 10/18/2023   FINDINGS: Lower chest: Scarring noted within the left lower lobe. No pleural effusion or consolidative change.   Hepatobiliary: Several small,  subcentimeter low-attenuation liver lesions identified within both lobes of liver. Technically too small to characterize but favored to represent small cysts. These appear similar to the previous MRI. No suspicious focal liver abnormality. Gallbladder appears normal. No bile duct dilatation.   Pancreas: Unremarkable. No pancreatic ductal dilatation or surrounding inflammatory changes.   Spleen: Unchanged appearance of the normal size spleen with adjacent accessory spleen.   Adrenals/Urinary Tract: Adrenal glands appear normal. Bilateral, subcentimeter kidney cysts are technically too small to reliably characterize but appear unchanged from previous exam. These are compatible with Bosniak class 2 cyst. No follow-up imaging recommended. No nephrolithiasis or obstructive uropathy. The bladder is normal.   Stomach/Bowel: Stomach is normal. The appendix is visualized and appears normal. No pathologic dilatation of the large or small bowel loops. No bowel wall thickening or inflammatory changes identified.   Vascular/Lymphatic: Normal appearance of the abdominal aorta. The upper abdominal vascularity is patent. No signs of abdominopelvic adenopathy.   Reproductive: Uterus and the adnexal structures are unremarkable.   Other: No ascites or focal fluid collection. No signs of pneumoperitoneum.   Musculoskeletal: Right hip arthroplasty. No acute or suspicious osseous findings.   IMPRESSION: 1. No acute findings within the abdomen or pelvis. No change from previous imaging.     Electronically Signed   By: Signa Kell M.D.   On: 10/26/2023 17:06  Assessment/Plan:  57 y.o. female with gallbladder sludge, complicated by pertinent comorbidities including acute PE.   Patient is readmitted due to chest pain and right upper quadrant pain.  On previous admission she was admitted with acute cholecystitis and new diagnosis of PE.  Due to the PE she was placed on anticoagulation, per  hematologist recommended Coumadin.  Cholecystitis finding has been equivocal.  Specially on this admission, the ultrasound shows normal gallbladder wall, mobile sludge, no pericholecystic fluid.  CT scan of the abdomen and pelvis without any significant finding, no stranding around the gallbladder.  I have low suspicious of cholecystitis at this point.  Will confirm with HIDA scan.  If HIDA scan is normal no further treatment for cholecystitis will be needed.  Patient will need further workup by hospitalist to look for other causes of chest pain, right upper quadrant pain such as musculoskeletal etiology, back pain, neurogenic pain among others.  Will continue to follow along and give further recommendation after HIDA scan.  If HIDA scan is positive will recommend percutaneous cholecystostomy.   Gae Gallop, MD

## 2023-10-28 ENCOUNTER — Inpatient Hospital Stay: Payer: 59 | Admitting: Oncology

## 2023-10-28 ENCOUNTER — Other Ambulatory Visit: Payer: 59

## 2023-10-28 ENCOUNTER — Telehealth: Payer: Self-pay | Admitting: Oncology

## 2023-10-28 NOTE — Telephone Encounter (Signed)
Patient left VM requesting to cancel hospital follow-up with Dr. Cathie Hoops. Attempt made to call patient back, no answer, left her my direct extension so that we may reschedule.

## 2023-10-29 ENCOUNTER — Telehealth: Payer: Self-pay

## 2023-10-29 NOTE — Telephone Encounter (Signed)
Will defer this to Dr. Shea Evans

## 2023-10-29 NOTE — Telephone Encounter (Signed)
pt called states that she has been in the hospital 3 more times since she last seen you. she states that the hospital has changed her medications again. she states she is having issues with anxiety and she wanted to speak with you about the changes that was made.    Please call her back when you have time.   Pt was last seen on 10-23 and next appt 11-12

## 2023-10-29 NOTE — Telephone Encounter (Signed)
Attempted to contact patient, had to leave a voicemail. 

## 2023-10-30 ENCOUNTER — Ambulatory Visit (INDEPENDENT_AMBULATORY_CARE_PROVIDER_SITE_OTHER): Payer: 59 | Admitting: Licensed Clinical Social Worker

## 2023-10-30 DIAGNOSIS — Z91199 Patient's noncompliance with other medical treatment and regimen due to unspecified reason: Secondary | ICD-10-CM

## 2023-10-30 NOTE — Progress Notes (Signed)
Patient ID: Jamie [name], female   DOB: [birth year], 57 y.o.   MRN: [id number]   Clinician attempted session via telehealth, but Jamie [name] failed to join. Cln. Sent link via text to the number on file. Cln. Also left a VM for the pt. Per [facility] policy, she will be charged a no-show fee.

## 2023-10-30 NOTE — Telephone Encounter (Signed)
Returned call to patient.  Patient reports she could not get hydroxyzine filled at the pharmacy.  Discussed with patient it was sent out on October 23.  Patient agrees to call pharmacy to verify and let this provider know.

## 2023-10-30 NOTE — Telephone Encounter (Signed)
pt returning your call

## 2023-10-30 NOTE — Telephone Encounter (Signed)
pt left a message that she was returning your call

## 2023-10-30 NOTE — Telephone Encounter (Signed)
Attempted to contact patient again had to leave a voicemail.

## 2023-11-04 ENCOUNTER — Inpatient Hospital Stay: Payer: 59 | Admitting: Oncology

## 2023-11-04 ENCOUNTER — Inpatient Hospital Stay: Payer: 59

## 2023-11-05 ENCOUNTER — Telehealth: Payer: Self-pay

## 2023-11-05 ENCOUNTER — Telehealth (INDEPENDENT_AMBULATORY_CARE_PROVIDER_SITE_OTHER): Payer: 59 | Admitting: Psychiatry

## 2023-11-05 DIAGNOSIS — F411 Generalized anxiety disorder: Secondary | ICD-10-CM

## 2023-11-05 NOTE — Telephone Encounter (Signed)
Patient was offered an appointment which was scheduled at 2 PM today.  Patient however at the time of appointment did not show and did not respond to phone calls.  Voicemail was left for patient.

## 2023-11-05 NOTE — Telephone Encounter (Signed)
pt called states that she has not seen any improvement by taking the hydroxyzine. pt was last seen on 10-23 next appt 11-12

## 2023-11-05 NOTE — Progress Notes (Signed)
No response to call or text or video invite  

## 2023-11-10 ENCOUNTER — Encounter: Payer: Self-pay | Admitting: Oncology

## 2023-11-10 ENCOUNTER — Inpatient Hospital Stay: Payer: 59

## 2023-11-10 ENCOUNTER — Inpatient Hospital Stay: Payer: 59 | Attending: Oncology | Admitting: Oncology

## 2023-11-10 VITALS — BP 131/80 | HR 88 | Temp 97.7°F | Resp 18 | Wt 158.3 lb

## 2023-11-10 DIAGNOSIS — M8580 Other specified disorders of bone density and structure, unspecified site: Secondary | ICD-10-CM | POA: Diagnosis not present

## 2023-11-10 DIAGNOSIS — I2694 Multiple subsegmental pulmonary emboli without acute cor pulmonale: Secondary | ICD-10-CM | POA: Diagnosis not present

## 2023-11-10 DIAGNOSIS — I748 Embolism and thrombosis of other arteries: Secondary | ICD-10-CM

## 2023-11-10 DIAGNOSIS — Z7901 Long term (current) use of anticoagulants: Secondary | ICD-10-CM | POA: Diagnosis not present

## 2023-11-10 DIAGNOSIS — Z86718 Personal history of other venous thrombosis and embolism: Secondary | ICD-10-CM | POA: Diagnosis not present

## 2023-11-10 DIAGNOSIS — I2699 Other pulmonary embolism without acute cor pulmonale: Secondary | ICD-10-CM | POA: Insufficient documentation

## 2023-11-10 LAB — PROTIME-INR
INR: 2.3 — ABNORMAL HIGH (ref 0.8–1.2)
Prothrombin Time: 25.8 s — ABNORMAL HIGH (ref 11.4–15.2)

## 2023-11-10 MED ORDER — RIVAROXABAN (XARELTO) VTE STARTER PACK (15 & 20 MG): ORAL_TABLET | ORAL | 0 refills | Status: AC

## 2023-11-10 NOTE — Assessment & Plan Note (Signed)
Continue anticoagulation 

## 2023-11-10 NOTE — Progress Notes (Signed)
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 in size. Right Atrium: Right atrial size was normal in size. Pericardium: There is no evidence of pericardial effusion. Mitral Valve: The mitral valve is normal in structure. Mild mitral valve regurgitation. MV peak gradient, 2.4 mmHg. The mean mitral valve gradient is 1.0  mmHg. Tricuspid Valve: The tricuspid valve is normal in structure. Tricuspid valve regurgitation is trivial. Aortic Valve: The aortic valve is tricuspid. Aortic valve regurgitation is not visualized. Aortic valve sclerosis is present, with no evidence of aortic valve stenosis. Aortic valve mean gradient measures 3.0 mmHg. Aortic valve peak gradient measures 6.9  mmHg. Aortic valve area, by VTI measures 2.69 cm. Pulmonic Valve: The pulmonic valve was normal in structure. Pulmonic valve regurgitation is trivial. Aorta: The aortic root is normal in size and structure. Venous: The inferior vena cava is normal in size with greater than 50% respiratory variability, suggesting right atrial pressure of 3 mmHg.  IAS/Shunts: No atrial level shunt detected by color flow Doppler.  LEFT VENTRICLE PLAX 2D LVIDd:         5.00 cm   Diastology LVIDs:         2.50 cm   LV e' medial:    7.62 cm/s LV PW:         0.90 cm   LV E/e' medial:  8.0 LV IVS:        0.90 cm   LV e' lateral:   12.70 cm/s LVOT diam:     2.10 cm   LV E/e' lateral: 4.8 LV SV:         78 LV SV Index:   45 LVOT Area:     3.46 cm  RIGHT VENTRICLE RV Basal diam:  3.00 cm RV Mid diam:    2.90 cm RV S prime:     14.70 cm/s TAPSE (M-mode): 2.5 cm LEFT ATRIUM           Index        RIGHT ATRIUM           Index LA diam:      3.80 cm 2.22 cm/m   RA Area:     14.20 cm LA Vol (A2C): 52.2 ml 30.51 ml/m  RA Volume:   30.90 ml  18.06 ml/m LA Vol (A4C): 40.8 ml 23.85 ml/m  AORTIC VALVE                    PULMONIC VALVE AV Area (Vmax):    2.75 cm     PV Vmax:       0.83 m/s AV Area (Vmean):   2.50 cm     PV Peak grad:  2.8 mmHg AV Area (VTI):     2.69 cm AV Vmax:           131.00 cm/s AV Vmean:          84.500 cm/s AV VTI:            0.288 m AV Peak Grad:      6.9 mmHg AV Mean Grad:      3.0 mmHg LVOT Vmax:         104.00 cm/s LVOT Vmean:        60.900 cm/s LVOT VTI:          0.224 m LVOT/AV VTI ratio: 0.78  AORTA Ao Root diam: 3.10 cm MITRAL VALVE MV Area (PHT): 3.34 cm    SHUNTS MV Area VTI:   3.15 cm    Systemic VTI:  0.22 m MV Peak grad:  2.4 mmHg    Systemic Diam: 2.10 cm MV Mean grad:  1.0 mmHg MV Vmax:       0.77 m/s MV Vmean:      46.1 cm/s MV Decel Time: 227 msec MV E velocity: 60.80  cm/s MV A velocity: 64.70 cm/s MV E/A ratio:  0.94 Debbe Odea MD Electronically signed by Debbe Odea MD Signature Date/Time: 10/17/2023/4:17:28 PM    Final    US Venous Img Lower Bilateral (DVT)  Result Date: 10/17/2023 CLINICAL DATA:  Pulmonary embolism. History of previous DVT. Evaluate for acute or chronic DVT. EXAM: BILATERAL LOWER EXTREMITY VENOUS DOPPLER ULTRASOUND TECHNIQUE: Gray-scale sonography with graded compression, as well as color Doppler and duplex  ultrasound were performed to evaluate the lower extremity deep venous systems from the level of the common femoral vein and including the common femoral, femoral, profunda femoral, popliteal and calf veins including the posterior tibial, peroneal and gastrocnemius veins when visible. The superficial great saphenous vein was also interrogated. Spectral Doppler was utilized to evaluate flow at rest and with distal augmentation maneuvers in the common femoral, femoral and popliteal veins. COMPARISON:  Chest CT- 10/17/2023 FINDINGS: RIGHT LOWER EXTREMITY Common Femoral Vein: No evidence of thrombus. Normal compressibility, respiratory phasicity and response to augmentation. Saphenofemoral Junction: No evidence of thrombus. Normal compressibility and flow on color Doppler imaging. Profunda Femoral Vein: No evidence of thrombus. Normal compressibility and flow on color Doppler imaging. Femoral Vein: No evidence of thrombus. Normal compressibility, respiratory phasicity and response to augmentation. Popliteal Vein: No evidence of thrombus. Normal compressibility, respiratory phasicity and response to augmentation. Calf Veins: No evidence of thrombus. Normal compressibility and flow on color Doppler imaging. Superficial Great Saphenous Vein: No evidence of thrombus. Normal compressibility. Other Findings:  None. LEFT LOWER EXTREMITY Common Femoral Vein: No evidence of thrombus. Normal compressibility, respiratory phasicity and response to augmentation. Saphenofemoral Junction: No evidence of thrombus. Normal compressibility and flow on color Doppler imaging. Profunda Femoral Vein: No evidence of thrombus. Normal compressibility and flow on color Doppler imaging. Femoral Vein: No evidence of thrombus. Normal compressibility, respiratory phasicity and response to augmentation. Popliteal Vein: No evidence of thrombus. Normal compressibility, respiratory phasicity and response to augmentation. Calf Veins: No evidence of  thrombus. Normal compressibility and flow on color Doppler imaging. Superficial Great Saphenous Vein: No evidence of thrombus. Normal compressibility. Other Findings:  None. IMPRESSION: No evidence of acute or chronic DVT within either lower extremity. Electronically Signed   By: Simonne Come M.D.   On: 10/17/2023 10:43   CT Angio Chest PE W and/or Wo Contrast  Result Date: 10/17/2023 CLINICAL DATA:  Chest pain with positive D-dimer. Patient states pain is 8/10. EXAM: CT ANGIOGRAPHY CHEST WITH CONTRAST TECHNIQUE: Multidetector CT imaging of the chest was performed using the standard protocol during bolus administration of intravenous contrast. Multiplanar CT image reconstructions and MIPs were obtained to evaluate the vascular anatomy. RADIATION DOSE REDUCTION: This exam was performed according to the departmental dose-optimization program which includes automated exposure control, adjustment of the mA and/or kV according to patient size and/or use of iterative reconstruction technique. CONTRAST:  75mL OMNIPAQUE IOHEXOL 350 MG/ML SOLN COMPARISON:  Portable chest today, chest CT with contrast 09/19/2009. FINDINGS: Cardiovascular: There is acute near occlusive thrombus in the right upper lobe apical segmental artery and with extension into the 2 of its branch arteries, with additional subsegmental thrombus in the posterior segment of the right upper lobe (series 4 images 45-50; and images 43-45). This represents a small clot burden, without findings of right heart strain, without other visible emboli bilaterally. The pulmonary trunk and main arteries are normal caliber. There is mild cardiomegaly, with a left chamber predominance. There is no pericardial effusion. The pulmonary veins are mildly prominent. There is scattered calcific  plaque in the LAD coronary artery only. The aorta and great vessels are unremarkable apart from mild descending aortic tortuosity. No visible plaques. Mediastinum/Nodes: No enlarged  mediastinal, hilar, or axillary lymph nodes. Thyroid gland, trachea, and esophagus demonstrate no significant findings. Both main bronchi are patent. Lungs/Pleura: No pleural effusion, thickening or pneumothorax. The bronchi are mildly thickened. Again noted is a mildly elevated right hemidiaphragm. There is scattered linear scarring or atelectasis in the left lower lobe. The lungs are clear of infiltrates and nodules. Upper Abdomen: Single small stone in the posterior gallbladder. Mild hepatic steatosis. Gallbladder is not fully seen. Musculoskeletal: Osteopenia, thoracic kyphosis and multilevel degenerative discs with multilevel bridging enthesopathy. No acute or other significant osseous findings. The ribcage is intact. Review of the MIP images confirms the above findings. IMPRESSION: 1. Acute near occlusive thrombus in the right upper lobe apical segmental artery with extension into the 2 of its branch arteries, and additional subsegmental thrombus in the posterior segment of the right upper lobe. 2. Small overall clot burden with no findings of acute right heart strain. 3. Mild cardiomegaly with left chamber predominance and mildly prominent pulmonary veins. No pulmonary edema or pleural effusion. 4. Bronchitis without evidence of pneumonia. 5. Cholelithiasis. 6. Hepatic steatosis. 7. Osteopenia, kyphosis and degenerative change. 8. Critical Value/emergent results were called by telephone at the time of interpretation on 10/17/2023 at 7:20 am to provider DR. ISAACS, who verbally acknowledged these results. Electronically Signed   By: Almira Bar M.D.   On: 10/17/2023 07:20   US ABDOMEN LIMITED RUQ (LIVER/GB)  Result Date: 10/17/2023 CLINICAL DATA:  Chest and abdomen pain. [id number] with right upper quadrant abdomen pain. EXAM: ULTRASOUND ABDOMEN LIMITED RIGHT UPPER QUADRANT COMPARISON:  CT with IV contrast 10/12/2009. FINDINGS: Gallbladder: There is sludge and a few tiny layering stones in the gallbladder.  The gallbladder is dilated to 12 cm in length with thickening over portions up to 7.9 mm, positive sonographic Murphy's sign, and pericholecystic fluid. Findings are worrisome for acute cholecystitis. Common bile duct: Diameter: 5.1 mm, within normal limits. No intrahepatic biliary prominence. Liver: No focal lesion identified. There is mild increased parenchymal echogenicity consistent with steatosis. Portal vein is patent on color Doppler imaging with normal direction of blood flow towards the liver. Other: None. IMPRESSION: 1. Findings worrisome for acute cholecystitis. There is sludge and a few tiny stones in the gallbladder. Surgical consult recommended. 2. Hepatic steatosis. Electronically Signed   By: Almira Bar M.D.   On: 10/17/2023 07:03   DG Chest Port 1 View  Result Date: 10/17/2023 CLINICAL DATA:  57 year old female with history of chest pain. EXAM: PORTABLE CHEST 1 VIEW COMPARISON:  No priors. FINDINGS: Lung volumes are normal. No consolidative airspace disease. No pleural effusions. No pneumothorax. No pulmonary nodule or mass noted. Pulmonary vasculature and the cardiomediastinal silhouette are within normal limits. IMPRESSION: No radiographic evidence of acute cardiopulmonary disease. Electronically Signed   By: Trudie Reed M.D.   On: 10/17/2023 06:24

## 2023-11-10 NOTE — Assessment & Plan Note (Addendum)
Likely unprovoked as her respiratory symptoms started before cholecystitis symptoms. Recommend 6 months of therapeutic anticoagulation followed by prophylactic anticoagulation.  She is currently on Lovenox and Coumadin. Given that patient has been off carbamazepine, there is no restrictions to switch to DOACs Today's INR is 2.3.  Recommend patient to hold off taking additional Coumadin, continue Lovenox 70 mg twice daily. I will repeat INR on 11/13/2023 and if INR is less than 2, patient will be switched to Xarelto 15 mg twice daily for 3 weeks followed by 20 mg daily.  Check hypercoagulable work up at next visit.

## 2023-11-11 ENCOUNTER — Telehealth: Payer: Self-pay

## 2023-11-11 ENCOUNTER — Other Ambulatory Visit: Payer: Self-pay

## 2023-11-11 ENCOUNTER — Telehealth (INDEPENDENT_AMBULATORY_CARE_PROVIDER_SITE_OTHER): Payer: 59 | Admitting: Psychiatry

## 2023-11-11 ENCOUNTER — Encounter: Payer: Self-pay | Admitting: Psychiatry

## 2023-11-11 DIAGNOSIS — F411 Generalized anxiety disorder: Secondary | ICD-10-CM | POA: Diagnosis not present

## 2023-11-11 DIAGNOSIS — F331 Major depressive disorder, recurrent, moderate: Secondary | ICD-10-CM

## 2023-11-11 DIAGNOSIS — R52 Pain, unspecified: Secondary | ICD-10-CM | POA: Diagnosis not present

## 2023-11-11 DIAGNOSIS — I2694 Multiple subsegmental pulmonary emboli without acute cor pulmonale: Secondary | ICD-10-CM

## 2023-11-11 DIAGNOSIS — G4701 Insomnia due to medical condition: Secondary | ICD-10-CM | POA: Diagnosis not present

## 2023-11-11 DIAGNOSIS — Z79899 Other long term (current) drug therapy: Secondary | ICD-10-CM

## 2023-11-11 MED ORDER — AMITRIPTYLINE HCL 100 MG PO TABS: 100.0000 mg | ORAL_TABLET | Freq: Every day | ORAL | 2 refills | Status: DC

## 2023-11-11 MED ORDER — AMITRIPTYLINE HCL 25 MG PO TABS: 25.0000 mg | ORAL_TABLET | Freq: Every day | ORAL | 1 refills | Status: DC

## 2023-11-11 NOTE — Telephone Encounter (Signed)
Called and left message that Dr. Cathie Hoops would like to check PT/INR on 11/14. I told patient that Morrie Sheldon will schedule and notify her of time.

## 2023-11-11 NOTE — Telephone Encounter (Signed)
-----   Message from Rickard Patience sent at 11/10/2023  7:37 PM EST ----- Please schedule her to get INR on 11/14,

## 2023-11-12 ENCOUNTER — Inpatient Hospital Stay: Payer: 59

## 2023-11-12 NOTE — Progress Notes (Signed)
Virtual Visit via Video Note  I connected with Jamie [name] on 11/11/23 at  4:00 PM EST by a video enabled telemedicine application and verified that I am speaking with the correct person using two identifiers.  Location Provider Location : [facility] Patient Location : Home  Participants: Patient , Provider    I discussed the limitations of evaluation and management by telemedicine and the availability of in person appointments. The patient expressed understanding and agreed to proceed.    I discussed the assessment and treatment plan with the patient. The patient was provided an opportunity to ask questions and all were answered. The patient agreed with the plan and demonstrated an understanding of the instructions.   The patient was advised to call back or seek an in-person evaluation if the symptoms worsen or if the condition fails to improve as anticipated.    [facility] MD OP Progress Note  11/12/2023 5:05 PM Jamie [name]  MRN:  [id number]  Chief Complaint:  Chief Complaint  Patient presents with   Follow-up   Depression   Anxiety   Medication Refill   HPI: Jamie [name] is a 57 year old Caucasian female, married, lives in [place], has a history of MDD, insomnia, chronic pain, GAD was evaluated by telemedicine today.  Patient today reports she continues to struggle with anxiety symptoms.  She continues to have a lot of physical problems from her most recent pulmonary embolism.  Patient reports she is currently trying to find the right medication to replace the warfarin.  Patient reports since stopping the carbamazepine due to concerns of interaction with her medications like warfarin she has noticed worsening mood lability.  Although she believes it is mostly anxiety since she does not feel as depressed as she used to before.  Patient reports sleep is restless when she is in a lot of pain.  She no longer takes oxycodone.  Patient denies any suicidality, homicidality or  perceptual disturbances.  Patient reports she continues to have struggles with her relationship with her spouse.  She hence is back at work.  She did not want to extend her FMLA.  She is worried about the fact that she is on a 90-day probation at her work and may not be able to keep it.  Patient is currently following up with her therapist.  Therapy sessions are beneficial.  She is compliant on the amitriptyline.  Agreeable to dosage increase.  Denies side effects.  She continues to be compliant on the Rexulti.  Patient currently denies any other concerns today.  Visit Diagnosis:    ICD-10-CM   1. MDD (major depressive disorder), recurrent episode, moderate (HCC)  F33.1 amitriptyline (ELAVIL) 25 MG tablet    amitriptyline (ELAVIL) 100 MG tablet    2. Insomnia due to medical condition  G47.01    multifactorial including pain    3. GAD (generalized anxiety disorder)  F41.1 amitriptyline (ELAVIL) 100 MG tablet    4. High risk medication use  Z79.899       Past Psychiatric History: I have reviewed past psychiatric history from progress note on 03/08/2019.  Past Medical History:  Past Medical History:  Diagnosis Date   Anxiety    Brachial neuritis    Cervical cancer (HCC)    Chronic neck pain    Chronic pain not due to malignancy    Depressive disorder    followed by Dr. Mordecai Rasmussen.   Disturbance, sleep    DVT (deep venous thrombosis) (HCC)    left  arm   Elevated blood pressure reading without diagnosis of hypertension    Embolism and thrombosis of splenic artery    H/O thrombophlebitis    Infarction of spleen    Lumbosacral neuritis    Obesity, Class II, BMI 35-39.9, with comorbidity     Past Surgical History:  Procedure Laterality Date   CESAREAN SECTION  09/24/2010   IVC FILTER INSERTION N/A 05/24/2021   Procedure: IVC FILTER INSERTION;  Surgeon: Annice Needy, MD;  Location: [facility]IVE [facility];  Service: Cardiovascular;  Laterality: N/A;   IVC FILTER REMOVAL N/A  07/18/2021   Procedure: IVC FILTER REMOVAL;  Surgeon: Annice Needy, MD;  Location: [facility];  Service: Cardiovascular;  Laterality: N/A;   TONSILLECTOMY AND ADENOIDECTOMY  09/24/2010   TOTAL HIP ARTHROPLASTY Right 05/29/2021   Procedure: TOTAL HIP ARTHROPLASTY ANTERIOR APPROACH;  Surgeon: Kennedy Bucker, MD;  Location: [facility] ORS;  Service: Orthopedics;  Laterality: Right;   TUBAL LIGATION  09/24/2010    Family Psychiatric History: I have reviewed family psychiatric history from progress note on 03/08/2019.  Family History:  Family History  Problem Relation Age of Onset   Anxiety disorder Mother    Depression Mother    Alcohol abuse Brother    Depression Daughter     Social History: I have reviewed social history from progress note on 03/08/2019. Social History   Socioeconomic History   Marital status: Married    Spouse name: rodney   Number of children: 3   Years of education: Not on file   Highest education level: Some college, no degree  Occupational History    Comment: full time  Tobacco Use   Smoking status: Never   Smokeless tobacco: Never  Vaping Use   Vaping status: Never Used  Substance and Sexual Activity   Alcohol use: Yes    Alcohol/week: 0.0 standard drinks of alcohol    Comment: social   Drug use: Not Currently    Types: Marijuana   Sexual activity: Yes    Partners: Male    Birth control/protection: Surgical    Comment: Tubal ligation   Other Topics Concern   Not on file  Social History Narrative   Not on file   Social Determinants of Health   Financial Resource Strain: Medium Risk (02/03/2018)   Overall Financial Resource Strain (CARDIA)    Difficulty of Paying Living Expenses: Somewhat hard  Food Insecurity: No Food Insecurity (10/26/2023)   Hunger Vital Sign    Worried About Running Out of Food in the Last Year: Never true    Ran Out of Food in the Last Year: Never true  Transportation Needs: No Transportation Needs (10/27/2023)   PRAPARE  - Administrator, Civil Service (Medical): No    Lack of Transportation (Non-Medical): No  Physical Activity: Inactive (02/03/2018)   Exercise Vital Sign    Days of Exercise per Week: 0 days    Minutes of Exercise per Session: 0 min  Stress: Stress Concern Present (02/03/2018)   [employer] of Occupational Health - Occupational Stress Questionnaire    Feeling of Stress : Very much  Social Connections: Somewhat Isolated (02/03/2018)   Social Connection and Isolation Panel [NHANES]    Frequency of Communication with Friends and Family: More than three times a week    Frequency of Social Gatherings with Friends and Family: Never    Attends Religious Services: Never    Database administrator or Organizations: No  Attends Banker Meetings: Never    Marital Status: Married    Allergies:  Allergies  Allergen Reactions   Hydrocodone-Acetaminophen Nausea Only   Neuromuscular Blocking Agents Other (See Comments)    Don't work    Metabolic Disorder Labs: Lab Results  Component Value Date   HGBA1C 5.6 10/31/2021   MPG 114.02 10/31/2021   Lab Results  Component Value Date   PROLACTIN 13.5 10/31/2021   No results found for: "CHOL", "TRIG", "HDL", "CHOLHDL", "VLDL", "LDLCALC" No results found for: "TSH"  Therapeutic Level Labs: No results found for: "LITHIUM" No results found for: "VALPROATE" Lab Results  Component Value Date   CBMZ 5.4 11/01/2022    Current Medications: Current Outpatient Medications  Medication Sig Dispense Refill   acetaminophen (TYLENOL) 325 MG tablet Take 2 tablets (650 mg total) by mouth every 6 (six) hours as needed for mild pain (pain score 1-3), headache or fever.     amitriptyline (ELAVIL) 25 MG tablet Take 1 tablet (25 mg total) by mouth at bedtime. Take along with 100 mg , total of 125 mg daily. 30 tablet 1   Brexpiprazole (REXULTI) 3 MG TABS Take 1 tablet (3 mg total) by mouth daily with supper. 30 tablet 1    butalbital-acetaminophen-caffeine (FIORICET) 50-325-40 MG tablet Take 1 tablet by mouth every 6 (six) hours as needed (headache refractory to tylenol). 14 tablet 0   hydrOXYzine (VISTARIL) 25 MG capsule Take 1 capsule (25 mg total) by mouth 3 (three) times daily as needed for anxiety. And sleep 90 capsule 1   polyethylene glycol (MIRALAX / GLYCOLAX) 17 g packet Take 17 g by mouth daily. 30 each 0   RIVAROXABAN (XARELTO) VTE STARTER PACK (15 & 20 MG) Follow package directions: Take one 15mg  tablet by mouth twice a day. On day 22, switch to one 20mg  tablet once a day. Take with food. 51 each 0   warfarin (COUMADIN) 5 MG tablet Take 1 tablet (5 mg total) by mouth daily for 20 days. 20 tablet 0   amitriptyline (ELAVIL) 100 MG tablet Take 1 tablet (100 mg total) by mouth at bedtime. Take along with 25 mg daily 30 tablet 2   enoxaparin (LOVENOX) 80 MG/0.8ML injection Inject 0.7 mLs (70 mg total) into the skin every 12 (twelve) hours for 14 days. 19.6 mL 0   naloxone (NARCAN) nasal spray 4 mg/0.1 mL as needed. (Patient not taking: Reported on 11/10/2023)     ondansetron (ZOFRAN) 4 MG tablet Take 1 tablet (4 mg total) by mouth every 6 (six) hours as needed for nausea. 20 tablet 0   No current facility-administered medications for this visit.     Musculoskeletal: Strength & Muscle Tone:  UTA Gait & Station:  Seated Patient leans: N/A  Psychiatric Specialty Exam: Review of Systems  Psychiatric/Behavioral:  Positive for sleep disturbance. The patient is nervous/anxious.     Last menstrual period 02/18/2018.There is no height or weight on file to calculate BMI.  General Appearance: Fairly Groomed  Eye Contact:  Fair  Speech:  Clear and Coherent  Volume:  Normal  Mood:  Anxious  Affect:  Congruent  Thought Process:  Goal Directed and Descriptions of Associations: Intact  Orientation:  Full (Time, Place, and Person)  Thought Content: Logical   Suicidal Thoughts:  No  Homicidal Thoughts:  No   Memory:  Immediate;   Fair Recent;   Fair Remote;   Fair  Judgement:  Fair  Insight:  Fair  Psychomotor Activity:  Normal  Concentration:  Concentration: Fair and Attention Span: Fair  Recall:  [employer] of Knowledge: Fair  Language: Fair  Akathisia:  No  Handed:  Right  AIMS (if indicated): not done  Assets:  Communication Skills Desire for Improvement Housing Social Support Transportation  ADL's:  Intact  Cognition: WNL  Sleep:   restless due to pain   Screenings: AIMS    Flowsheet Row Office Visit from 11/20/2022 in [facility] Video Visit from 10/14/2022 in [facility] Office Visit from 09/11/2022 in [facility] Video Visit from 05/13/2022 in [facility] Video Visit from 02/26/2022 in [facility]  AIMS Total Score 0 0 0 0 0      GAD-7    Flowsheet Row Office Visit from 10/22/2023 in [facility] Office Visit from 11/20/2022 in [facility] Office Visit from 09/11/2022 in [facility] Video Visit from 05/13/2022 in [facility] Video Visit from 02/26/2022 in [facility]  Total GAD-7 Score 14 4 9 3 6       PHQ2-9    Flowsheet Row Office Visit from 10/22/2023 in [facility] Counselor from 04/02/2023 in [facility] Visit from 11/20/2022 in [facility] Video Visit from 10[facility]24/2022 in [facility] Video Visit from 10/14/2022 in [facility]  PHQ-2 Total Score 2 2 2 5 2   PHQ-9  Total Score 8 17 5 11 5       Flowsheet Row Video Visit from 11/11/2023 in [facility] ED to Hosp-Admission (Discharged) from 10/26/2023 in [facility] Office Visit from 10/22/2023 in [facility]  C-SSRS RISK CATEGORY No Risk No Risk No Risk        Assessment and Plan: Jamie [name] is a 57 year old Caucasian female who has a history of MDD, chronic pain, insomnia was evaluated by telemedicine today.  Patient with recent medical admission for pulmonary embolism, acute cholecystitis, continues to struggle with physical complaints due to the same, as well as relationship struggles with her spouse which all contribute to continued anxiety symptoms, will benefit from the following plan.  Plan MDD-some progress Continue Rexulti 3 mg p.o. daily Increase amitriptyline to 125 mg p.o. nightly  GAD-unstable Continue CBT with Ms. Renee Ramus Increase amitriptyline to 125 mg p.o. daily Hydroxyzine 25 mg 3 times a day as needed for severe anxiety symptoms.  Insomnia-multifactorial-improving Increase amitriptyline to 125 mg p.o. nightly Patient will need sufficient pain management  High risk medication use-patient will benefit from repeat labs including lipid panel, prolactin level, hemoglobin A1c, TSH.  Patient agrees to discuss with primary care provider at her next visit.  If unable to do so will repeat this at her next visit here.  Follow-up in clinic in 4 weeks or sooner if needed.  Collaboration of Care: Collaboration of Care: Referral or follow-up with counselor/therapist AEB patient encouraged to continue CBT.  Patient/Guardian was advised Release of Information must be obtained prior to any record release in order to collaborate their care with an outside provider. Patient/Guardian was advised if they have not already done so to contact the registration department  to sign all necessary forms  in order for [place] to release information regarding their care.   Consent: Patient/Guardian gives verbal consent for treatment and assignment of benefits for services provided during this visit. Patient/Guardian expressed understanding and agreed to proceed.   This note was generated in part or whole with voice recognition software. Voice recognition is usually quite accurate but there are transcription errors that can and very often do occur. I apologize for any typographical errors that were not detected and corrected.    Jomarie Longs, MD 11/12/2023, 5:05 PM

## 2023-11-13 ENCOUNTER — Inpatient Hospital Stay: Payer: 59

## 2023-11-13 DIAGNOSIS — I2694 Multiple subsegmental pulmonary emboli without acute cor pulmonale: Secondary | ICD-10-CM

## 2023-11-13 DIAGNOSIS — I2699 Other pulmonary embolism without acute cor pulmonale: Secondary | ICD-10-CM | POA: Diagnosis not present

## 2023-11-13 LAB — PROTIME-INR
INR: 1.8 — ABNORMAL HIGH (ref 0.8–1.2)
Prothrombin Time: 21.3 s — ABNORMAL HIGH (ref 11.4–15.2)

## 2023-11-14 ENCOUNTER — Telehealth: Payer: Self-pay

## 2023-11-14 NOTE — Telephone Encounter (Signed)
Per MD: please advise patient to switch to Xarelto 15mg  BID. she should start the dose when she is due for next dose of Lovenox. [stop lovenox]

## 2023-11-14 NOTE — Telephone Encounter (Signed)
Attempt made to call pt again to make sure she listened to VM. No answer .

## 2023-11-14 NOTE — Telephone Encounter (Signed)
Called pt, no answer. Detailed VM left regarding MD recommendation. Mychart message also sent.

## 2023-11-19 ENCOUNTER — Other Ambulatory Visit: Payer: Self-pay | Admitting: Oncology

## 2023-12-12 ENCOUNTER — Telehealth (INDEPENDENT_AMBULATORY_CARE_PROVIDER_SITE_OTHER): Payer: 59 | Admitting: Psychiatry

## 2023-12-12 ENCOUNTER — Encounter: Payer: Self-pay | Admitting: Psychiatry

## 2023-12-12 DIAGNOSIS — G4701 Insomnia due to medical condition: Secondary | ICD-10-CM

## 2023-12-12 DIAGNOSIS — F331 Major depressive disorder, recurrent, moderate: Secondary | ICD-10-CM | POA: Diagnosis not present

## 2023-12-12 DIAGNOSIS — Z79899 Other long term (current) drug therapy: Secondary | ICD-10-CM | POA: Diagnosis not present

## 2023-12-12 DIAGNOSIS — F411 Generalized anxiety disorder: Secondary | ICD-10-CM

## 2023-12-12 NOTE — Progress Notes (Signed)
Virtual Visit via Video Note  I connected with Jamie [name] on 12/12/23 at 11:00 AM EST by a video enabled telemedicine application and verified that I am speaking with the correct person using two identifiers.  Location Provider Location : [facility] Patient Location : Work  Participants: Patient , Provider    I discussed the limitations of evaluation and management by telemedicine and the availability of in person appointments. The patient expressed understanding and agreed to proceed.   I discussed the assessment and treatment plan with the patient. The patient was provided an opportunity to ask questions and all were answered. The patient agreed with the plan and demonstrated an understanding of the instructions.   The patient was advised to call back or seek an in-person evaluation if the symptoms worsen or if the condition fails to improve as anticipated.   BH MD OP Progress Note  12/12/2023 3:55 PM Jamie [name]  MRN:  [id number]  Chief Complaint:  Chief Complaint  Patient presents with   Follow-up   Anxiety   Depression   Medication Refill   HPI: Jamie [name] is a 57 year old Caucasian female, married, lives in [place], has a history of MDD, insomnia, chronic pain, GAD was evaluated by telemedicine today.  The patient  was recently prescribed an increased dose of amitriptyline (125mg  at bedtime) and Rexulti (3mg ), along with hydroxyzine (25mg  three times a day as needed) for anxiety.  The patient reports feeling "a little better" and more level-headed despite her health issues. She is currently on a blood thinner Xarelto , which she has been told she will need to take for the rest of her life. She is also scheduled for gallbladder removal in six months. However, she expresses concern about feeling extremely tired every day, unsure if this is due to the ' blood thinner' or the new anxiety medication.  In addition to her physical health, the patient is also dealing  with personal issues. She recently received a promotion at work, which she views as a positive development. However, she is struggling with a relationship issue at home, which she is trying to manage day by day. She reports that some days, the issue weighs heavily on her mind, causing her to recall past events and conversations.  The patient also mentions the possibility of going through menopause, but this has not been confirmed. She is currently not seeing a therapist but plans to make an appointment with Renee Ramus.    Patient currently denies any suicidality, homicidality or perceptual disturbances.  Patient denies any other concerns today. Visit Diagnosis:    ICD-10-CM   1. MDD (major depressive disorder), recurrent episode, moderate (HCC)  F33.1     2. Insomnia due to medical condition  G47.01    Multifactorial including pain, mood    3. GAD (generalized anxiety disorder)  F41.1     4. High risk medication use  Z79.899       Past Psychiatric History: I have reviewed past psychiatric history from progress note on 03/08/2019.  Past Medical History:  Past Medical History:  Diagnosis Date   Anxiety    Brachial neuritis    Cervical cancer (HCC)    Chronic neck pain    Chronic pain not due to malignancy    Depressive disorder    followed by Dr. Mordecai Rasmussen.   Disturbance, sleep    DVT (deep venous thrombosis) (HCC)    left arm   Elevated blood pressure reading without diagnosis of hypertension  Embolism and thrombosis of splenic artery    H/O thrombophlebitis    Infarction of spleen    Lumbosacral neuritis    Obesity, Class II, BMI 35-39.9, with comorbidity     Past Surgical History:  Procedure Laterality Date   CESAREAN SECTION  09/24/2010   IVC FILTER INSERTION N/A 05/24/2021   Procedure: IVC FILTER INSERTION;  Surgeon: Annice Needy, MD;  Location: [facility];  Service: Cardiovascular;  Laterality: N/A;   IVC FILTER REMOVAL N/A 07/18/2021   Procedure:  IVC FILTER REMOVAL;  Surgeon: Annice Needy, MD;  Location: [facility];  Service: Cardiovascular;  Laterality: N/A;   TONSILLECTOMY AND ADENOIDECTOMY  09/24/2010   TOTAL HIP ARTHROPLASTY Right 05/29/2021   Procedure: TOTAL HIP ARTHROPLASTY ANTERIOR APPROACH;  Surgeon: Kennedy Bucker, MD;  Location: [facility] ORS;  Service: Orthopedics;  Laterality: Right;   TUBAL LIGATION  09/24/2010    Family Psychiatric History: I have reviewed family psychiatric history from progress note on 03/08/2019.  Family History:  Family History  Problem Relation Age of Onset   Anxiety disorder Mother    Depression Mother    Alcohol abuse Brother    Depression Daughter     Social History: I have reviewed social history from progress note on 03/08/2019. Social History   Socioeconomic History   Marital status: Married    Spouse name: rodney   Number of children: 3   Years of education: Not on file   Highest education level: Some college, no degree  Occupational History    Comment: full time  Tobacco Use   Smoking status: Never   Smokeless tobacco: Never  Vaping Use   Vaping status: Never Used  Substance and Sexual Activity   Alcohol use: Yes    Alcohol/week: 0.0 standard drinks of alcohol    Comment: social   Drug use: Not Currently    Types: Marijuana   Sexual activity: Yes    Partners: Male    Birth control/protection: Surgical    Comment: Tubal ligation   Other Topics Concern   Not on file  Social History Narrative   Not on file   Social Drivers of Health   Financial Resource Strain: Medium Risk (02/03/2018)   Overall Financial Resource Strain (CARDIA)    Difficulty of Paying Living Expenses: Somewhat hard  Food Insecurity: No Food Insecurity (10/26/2023)   Hunger Vital Sign    Worried About Running Out of Food in the Last Year: Never true    Ran Out of Food in the Last Year: Never true  Transportation Needs: No Transportation Needs (10/27/2023)   PRAPARE - Scientist, research (physical sciences) (Medical): No    Lack of Transportation (Non-Medical): No  Physical Activity: Inactive (02/03/2018)   Exercise Vital Sign    Days of Exercise per Week: 0 days    Minutes of Exercise per Session: 0 min  Stress: Stress Concern Present (02/03/2018)   [employer] of Occupational Health - Occupational Stress Questionnaire    Feeling of Stress : Very much  Social Connections: Somewhat Isolated (02/03/2018)   Social Connection and Isolation Panel [NHANES]    Frequency of Communication with Friends and Family: More than three times a week    Frequency of Social Gatherings with Friends and Family: Never    Attends Religious Services: Never    Database administrator or Organizations: No    Attends Banker Meetings: Never    Marital Status: Married  Allergies:  Allergies  Allergen Reactions   Hydrocodone-Acetaminophen Nausea Only   Neuromuscular Blocking Agents Other (See Comments)    Don't work    Metabolic Disorder Labs: Lab Results  Component Value Date   HGBA1C 5.6 10/31/2021   MPG 114.02 10/31/2021   Lab Results  Component Value Date   PROLACTIN 13.5 10/31/2021   No results found for: "CHOL", "TRIG", "HDL", "CHOLHDL", "VLDL", "LDLCALC" No results found for: "TSH"  Therapeutic Level Labs: No results found for: "LITHIUM" No results found for: "VALPROATE" Lab Results  Component Value Date   CBMZ 5.4 11/01/2022    Current Medications: Current Outpatient Medications  Medication Sig Dispense Refill   acetaminophen (TYLENOL) 325 MG tablet Take 2 tablets (650 mg total) by mouth every 6 (six) hours as needed for mild pain (pain score 1-3), headache or fever.     amitriptyline (ELAVIL) 100 MG tablet Take 1 tablet (100 mg total) by mouth at bedtime. Take along with 25 mg daily 30 tablet 2   amitriptyline (ELAVIL) 25 MG tablet Take 1 tablet (25 mg total) by mouth at bedtime. Take along with 100 mg , total of 125 mg daily. 30 tablet 1   Brexpiprazole  (REXULTI) 3 MG TABS Take 1 tablet (3 mg total) by mouth daily with supper. 30 tablet 1   butalbital-acetaminophen-caffeine (FIORICET) 50-325-40 MG tablet Take 1 tablet by mouth every 6 (six) hours as needed (headache refractory to tylenol). 14 tablet 0   hydrOXYzine (VISTARIL) 25 MG capsule Take 1 capsule (25 mg total) by mouth 3 (three) times daily as needed for anxiety. And sleep 90 capsule 1   naloxone (NARCAN) nasal spray 4 mg/0.1 mL as needed.     polyethylene glycol (MIRALAX / GLYCOLAX) 17 g packet Take 17 g by mouth daily. 30 each 0   RIVAROXABAN (XARELTO) VTE STARTER PACK (15 & 20 MG) Follow package directions: Take one 15mg  tablet by mouth twice a day. On day 22, switch to one 20mg  tablet once a day. Take with food. 51 each 0   ondansetron (ZOFRAN) 4 MG tablet Take 1 tablet (4 mg total) by mouth every 6 (six) hours as needed for nausea. (Patient not taking: Reported on 12/12/2023) 20 tablet 0   No current facility-administered medications for this visit.     Musculoskeletal: Strength & Muscle Tone:  UTA Gait & Station:  Seated Patient leans: N/A  Psychiatric Specialty Exam: Review of Systems  Psychiatric/Behavioral:  The patient is nervous/anxious.     Last menstrual period 02/18/2018.There is no height or weight on file to calculate BMI.  General Appearance: Fairly Groomed  Eye Contact:  Fair  Speech:  Clear and Coherent  Volume:  Normal  Mood:  Anxious improving  Affect:  Appropriate  Thought Process:  Goal Directed and Descriptions of Associations: Intact  Orientation:  Full (Time, Place, and Person)  Thought Content: Logical   Suicidal Thoughts:  No  Homicidal Thoughts:  No  Memory:  Immediate;   Fair Recent;   Fair Remote;   Fair  Judgement:  Fair  Insight:  Fair  Psychomotor Activity:  Normal  Concentration:  Concentration: Fair and Attention Span: Fair  Recall:  [employer] of Knowledge: Fair  Language: Fair  Akathisia:  No  Handed:  Right  AIMS (if  indicated): not done  Assets:  Desire for Improvement Social Support Transportation  ADL's:  Intact  Cognition: WNL  Sleep:   Improving   Screenings: Midwife  Visit from 11/20/2022 in [facility] Video Visit from 10/14/2022 in [facility] Office Visit from 09/11/2022 in [facility] Video Visit from 05/13/2022 in [facility] Video Visit from 02/26/2022 in [facility]  AIMS Total Score 0 0 0 0 0      GAD-7    Flowsheet Row Office Visit from 10/22/2023 in [facility] Office Visit from 11/20/2022 in [facility] Office Visit from 09/11/2022 in [facility] Video Visit from 05/13/2022 in [facility] Video Visit from 02/26/2022 in [facility]  Total GAD-7 Score 14 4 9 3 6       PHQ2-9    Flowsheet Row Office Visit from 10/22/2023 in [facility] Counselor from 04/02/2023 in [facility] at [facility] Visit from 11/20/2022 in [facility] Video Visit from 10/24/2022 in [facility] Video Visit from 10/14/2022 in [facility]  PHQ-2 Total Score 2 2 2 5 2   PHQ-9 Total Score 8 17 5 11 5       Flowsheet Row Video Visit from 12/12/2023 in [facility] Video Visit from 11/11/2023 in [facility] ED to Hosp-Admission (Discharged) from 10/26/2023 in [facility]  C-SSRS RISK CATEGORY  No Risk No Risk No Risk        Assessment and Plan: Jamie [name] is a 57 year old Caucasian female who has a history of MDD, chronic pain, pulmonary embolism, acute cholecystitis with upcoming surgery was evaluated by telemedicine today.  Discussed assessment and plan as noted below.   Major Depression-improving Currently on amitriptyline 125 mg at bedtime and Rexulti 3 mg. Reports feeling better and more level-headed but experiencing significant fatigue, possibly related to medications or other health issues. Discussed that fatigue usually improves over time with medication adjustment. - Continue amitriptyline 125 mg at bedtime - Continue Rexulti 3 mg - Follow up with primary doctor for medical clearance and evaluation of fatigue before making any dosage adjustments  Generalized Anxiety Disorder-improving Currently on hydroxyzine 25 mg three times a day as needed and continuing cognitive behavioral therapy (CBT) with Weldon Inches. Reports ongoing anxiety related to personal life stressors. Emphasized the importance of continuing therapy and scheduling an appointment with Weldon Inches. - Continue hydroxyzine 25 mg three times a day as needed - Continue CBT with Renee Ramus - Schedule an appointment with Renee Ramus as soon as possible  Insomnia-improving Managed with amitriptyline 125 mg at bedtime. - Continue amitriptyline 125 mg at bedtime - Continue sufficient pain management.  High risk medication use-patient noncompliant with labs, agrees to get lipid panel, prolactin level, hemoglobin A1c, TSH.  Patient would like to discuss with primary care at next visit.  Pulmonary Embolism Recent admission for pulmonary embolism. Currently on lifelong anticoagulation therapy with Xarelto. - Continue Xarelto as prescribed  Acute Cholecystitis Recent diagnosis of acute cholecystitis. Scheduled for cholecystectomy in April. - Await cholecystectomy in  April  Menopause Possible menopause contributing to fatigue and other symptoms. Needs further evaluation. - Discuss menopause symptoms with primary doctor during next appointment   Follow-up - Schedule follow-up appointment in one month - Schedule video visit on January 30th at 11 AM -  Contact primary doctor for medical clearance and evaluation of fatigue - Pick up amitriptyline refill from pharmacy - Notify provider 7-10 days prior to needing medication refills.  Collaboration of Care: Collaboration of Care: Referral or follow-up with counselor/therapist AEB patient encouraged to schedule an appointment with therapist as well as contact primary care provider as noted above.  Patient/Guardian was advised Release of Information must be obtained prior to any record release in order to collaborate their care with an outside provider. Patient/Guardian was advised if they have not already done so to contact the registration department to sign all necessary forms in order for [place] to release information regarding their care.   Consent: Patient/Guardian gives verbal consent for treatment and assignment of benefits for services provided during this visit. Patient/Guardian expressed understanding and agreed to proceed.   This note was generated in part or whole with voice recognition software. Voice recognition is usually quite accurate but there are transcription errors that can and very often do occur. I apologize for any typographical errors that were not detected and corrected.    Jomarie Longs, MD 12/12/2023, 3:55 PM

## 2024-01-04 ENCOUNTER — Other Ambulatory Visit: Payer: Self-pay | Admitting: Psychiatry

## 2024-01-04 DIAGNOSIS — F411 Generalized anxiety disorder: Secondary | ICD-10-CM

## 2024-01-04 DIAGNOSIS — F331 Major depressive disorder, recurrent, moderate: Secondary | ICD-10-CM

## 2024-01-04 DIAGNOSIS — G4701 Insomnia due to medical condition: Secondary | ICD-10-CM

## 2024-01-14 ENCOUNTER — Other Ambulatory Visit: Payer: Self-pay | Admitting: Psychiatry

## 2024-01-14 DIAGNOSIS — F331 Major depressive disorder, recurrent, moderate: Secondary | ICD-10-CM

## 2024-01-20 ENCOUNTER — Telehealth: Payer: Self-pay

## 2024-01-20 DIAGNOSIS — F411 Generalized anxiety disorder: Secondary | ICD-10-CM

## 2024-01-20 DIAGNOSIS — F3342 Major depressive disorder, recurrent, in full remission: Secondary | ICD-10-CM

## 2024-01-20 MED ORDER — REXULTI 3 MG PO TABS: 1.0000 | ORAL_TABLET | Freq: Every day | ORAL | 3 refills | Status: DC

## 2024-01-20 NOTE — Telephone Encounter (Signed)
received fax for patient assitant for the rexulti.  Pt has insurance should be cover. Will call pharmacy and find out.

## 2024-01-20 NOTE — Telephone Encounter (Signed)
I have sent Rexulti to pharmacy at [facility].

## 2024-01-22 NOTE — Telephone Encounter (Signed)
left message that rx was sent to the pharmacy and is ready for pick up and $0

## 2024-01-22 NOTE — Telephone Encounter (Signed)
pharmacy received and has ready the rexulti rx and it is $0

## 2024-01-29 ENCOUNTER — Encounter: Payer: Self-pay | Admitting: Psychiatry

## 2024-01-29 ENCOUNTER — Telehealth: Payer: 59 | Admitting: Psychiatry

## 2024-01-29 DIAGNOSIS — G4701 Insomnia due to medical condition: Secondary | ICD-10-CM | POA: Diagnosis not present

## 2024-01-29 DIAGNOSIS — F331 Major depressive disorder, recurrent, moderate: Secondary | ICD-10-CM

## 2024-01-29 DIAGNOSIS — F411 Generalized anxiety disorder: Secondary | ICD-10-CM

## 2024-01-29 DIAGNOSIS — R5383 Other fatigue: Secondary | ICD-10-CM | POA: Insufficient documentation

## 2024-01-29 NOTE — Progress Notes (Signed)
Virtual Visit via Video Note  I connected with Jamie [name] on 01/29/24 at 11:00 AM EST by a video enabled telemedicine application and verified that I am speaking with the correct person using two identifiers.  Location Provider Location : [facility] Patient Location : Work  Participants: Patient , Provider    I discussed the limitations of evaluation and management by telemedicine and the availability of in person appointments. The patient expressed understanding and agreed to proceed.   I discussed the assessment and treatment plan with the patient. The patient was provided an opportunity to ask questions and all were answered. The patient agreed with the plan and demonstrated an understanding of the instructions.   The patient was advised to call back or seek an in-person evaluation if the symptoms worsen or if the condition fails to improve as anticipated.  BH MD OP Progress Note  01/29/2024 10:28 PM Jamie [name]  MRN:  [id number]  Chief Complaint:  Chief Complaint  Patient presents with   Follow-up   Anxiety   Depression   Medication Refill   HPI: Jamie [name] is a 58 year old Caucasian female, lives in [place], has a history of MDD, insomnia, chronic pain, GAD was evaluated by telemedicine today.  The patient presents with ongoing fatigue and situational anxiety.  She is experiencing situational stress due to her separation from her spouse, although she maintains daily communication and he visits the children. She finds it challenging to make changes in her daily routine and is seeking ways to introduce something exciting to look forward to. She has not yet scheduled an appointment with a therapist despite previous discussions about its importance, citing procrastination and work commitments as reasons for the delay. No suicidal thoughts are reported, and she is sleeping well at night.  Persistent fatigue is described as 'still up there' and causing her to feel 'really  tired.' She is uncertain if the fatigue is related to menopause, medication side effects, or depression. She has an upcoming appointment with her primary medical doctor to further investigate the cause of her fatigue.  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  Her current medications include amitriptyline 125 mg, Rexulti 3 mg, and hydroxyzine as needed.  Patient currently denies any other concerns.   Visit Diagnosis:    ICD-10-CM   1. MDD (major depressive disorder), recurrent episode, moderate (HCC)  F33.1    Improving although continues to report residual symptoms like fatigue    2. Insomnia due to medical condition  G47.01    Multifactorial including pain    3. GAD (generalized anxiety disorder)  F41.1     4. Fatigue, unspecified type  R53.83       Past Psychiatric History: I have reviewed past psychiatric history from progress note on 03/08/2019.  Past Medical History:  Past Medical History:  Diagnosis Date   Anxiety    Brachial neuritis    Cervical cancer (HCC)    Chronic neck pain    Chronic pain not due to malignancy    Depressive disorder    followed by Dr. Mordecai Rasmussen.   Disturbance, sleep    DVT (deep venous thrombosis) (HCC)    left arm   Elevated blood pressure reading without diagnosis of hypertension    Embolism and thrombosis of splenic artery    H/O thrombophlebitis    Infarction of spleen    Lumbosacral neuritis  Obesity, Class II, BMI 35-39.9, with comorbidity     Past Surgical History:  Procedure Laterality Date   CESAREAN SECTION  09/24/2010   IVC FILTER INSERTION N/A 05/24/2021   Procedure: IVC FILTER INSERTION;  Surgeon: Annice Needy, MD;  Location: [facility] INVASIVE [facility];  Service: Cardiovascular;  Laterality: N/A;   IVC FILTER REMOVAL N/A 07/18/2021   Procedure: IVC FILTER REMOVAL;  Surgeon: Annice Needy, MD;  Location: [facility] INV[facility] CV [facility];  Service: Cardiovascular;   Laterality: N/A;   TONSILLECTOMY AND ADENOIDECTOMY  09/24/2010   TOTAL HIP ARTHROPLASTY Right 05/29/2021   Procedure: TOTAL HIP ARTHROPLASTY ANTERIOR APPROACH;  Surgeon: Kennedy Bucker, MD;  Location: [facility] ORS;  Service: Orthopedics;  Laterality: Right;   TUBAL LIGATION  09/24/2010    Family Psychiatric History: I have reviewed family psychiatric history from progress note on 03/08/2019.  Family History:  Family History  Problem Relation Age of Onset   Anxiety disorder Mother    Depression Mother    Alcohol abuse Brother    Depression Daughter     Social History: I have reviewed social history from progress note on 03/08/2019. Social History   Socioeconomic History   Marital status: Married    Spouse name: rodney   Number of children: 3   Years of education: Not on file   Highest education level: Some college, no degree  Occupational History    Comment: full time  Tobacco Use   Smoking status: Never   Smokeless tobacco: Never  Vaping Use   Vaping status: Never Used  Substance and Sexual Activity   Alcohol use: Yes    Alcohol/week: 0.0 standard drinks of alcohol    Comment: social   Drug use: Not Currently    Types: Marijuana   Sexual activity: Yes    Partners: Male    Birth control/protection: Surgical    Comment: Tubal ligation   Other Topics Concern   Not on file  Social History Narrative   Not on file   Social Drivers of Health   Financial Resource Strain: Medium Risk (02/03/2018)   Overall Financial Resource Strain (CARDIA)    Difficulty of Paying Living Expenses: Somewhat hard  Food Insecurity: No Food Insecurity (10/26/2023)   Hunger Vital Sign    Worried About Running Out of Food in the Last Year: Never true    Ran Out of Food in the Last Year: Never true  Transportation Needs: No Transportation Needs (10/27/2023)   PRAPARE - Administrator, Civil Service (Medical): No    Lack of Transportation (Non-Medical): No  Physical Activity: Inactive  (02/03/2018)   Exercise Vital Sign    Days of Exercise per Week: 0 days    Minutes of Exercise per Session: 0 min  Stress: Stress Concern Present (02/03/2018)   [employer] of Occupational Health - Occupational Stress Questionnaire    Feeling of Stress : Very much  Social Connections: Somewhat Isolated (02/03/2018)   Social Connection and Isolation Panel [NHANES]    Frequency of Communication with Friends and Family: More than three times a week    Frequency of Social Gatherings with Friends and Family: Never    Attends Religious Services: Never    Database administrator or Organizations: No    Attends Banker Meetings: Never    Marital Status: Married    Allergies:  Allergies  Allergen Reactions   Hydrocodone-Acetaminophen Nausea Only   Neuromuscular Blocking Agents Other (See Comments)    Don't work  Metabolic Disorder Labs: Lab Results  Component Value Date   HGBA1C 5.6 10/31/2021   MPG 114.02 10/31/2021   Lab Results  Component Value Date   PROLACTIN 13.5 10/31/2021   No results found for: "CHOL", "TRIG", "HDL", "CHOLHDL", "VLDL", "LDLCALC" No results found for: "TSH"  Therapeutic Level Labs: No results found for: "LITHIUM" No results found for: "VALPROATE" Lab Results  Component Value Date   CBMZ 5.4 11/01/2022    Current Medications: Current Outpatient Medications  Medication Sig Dispense Refill   acetaminophen (TYLENOL) 325 MG tablet Take 2 tablets (650 mg total) by mouth every 6 (six) hours as needed for mild pain (pain score 1-3), headache or fever.     amitriptyline (ELAVIL) 100 MG tablet Take 1 tablet (100 mg total) by mouth at bedtime. Take along with 25 mg daily 30 tablet 2   amitriptyline (ELAVIL) 25 MG tablet TAKE 1 TABLET BY MOUTH AT BEDTIME. TAKE ALONG WITH 100 MG , TOTAL OF 125 MG DAILY 30 tablet 0   Brexpiprazole (REXULTI) 3 MG TABS Take 1 tablet (3 mg total) by mouth daily with supper. 30 tablet 3    butalbital-acetaminophen-caffeine (FIORICET) 50-325-40 MG tablet Take 1 tablet by mouth every 6 (six) hours as needed (headache refractory to tylenol). 14 tablet 0   hydrOXYzine (VISTARIL) 25 MG capsule TAKE 1 CAPSULE BY MOUTH THREE TIMES DAILY AS NEEDED FOR ANXIETY AND FOR SLEEP 90 capsule 3   naloxone (NARCAN) nasal spray 4 mg/0.1 mL as needed.     ondansetron (ZOFRAN) 4 MG tablet Take 1 tablet (4 mg total) by mouth every 6 (six) hours as needed for nausea. (Patient not taking: Reported on 12/12/2023) 20 tablet 0   polyethylene glycol (MIRALAX / GLYCOLAX) 17 g packet Take 17 g by mouth daily. 30 each 0   RIVAROXABAN (XARELTO) VTE STARTER PACK (15 & 20 MG) Follow package directions: Take one 15mg  tablet by mouth twice a day. On day 22, switch to one 20mg  tablet once a day. Take with food. 51 each 0   No current facility-administered medications for this visit.     Musculoskeletal: Strength & Muscle Tone:  UTA Gait & Station:  Seated Patient leans: N/A  Psychiatric Specialty Exam: Review of Systems  Psychiatric/Behavioral:  The patient is nervous/anxious.     Last menstrual period 02/18/2018.There is no height or weight on file to calculate BMI.  General Appearance: Casual  Eye Contact:  Fair  Speech:  Normal Rate  Volume:  Normal  Mood:  Anxious  Affect:  Appropriate  Thought Process:  Goal Directed and Descriptions of Associations: Intact  Orientation:  Full (Time, Place, and Person)  Thought Content: Logical   Suicidal Thoughts:  No  Homicidal Thoughts:  No  Memory:  Immediate;   Fair Recent;   Fair Remote;   Fair  Judgement:  Fair  Insight:  Fair  Psychomotor Activity:  Normal  Concentration:  Concentration: Fair and Attention Span: Fair  Recall:  [employer] of Knowledge: Fair  Language: Fair  Akathisia:  No  Handed:  Right  AIMS (if indicated): not done  Assets:  Desire for Improvement Housing Social Support Talents/Skills  ADL's:  Intact  Cognition: WNL   Sleep:  Fair   Screenings: Geneticist, molecular Office Visit from 11/20/2022 in [facility] Video Visit from 10/14/2022 in [facility] Office Visit from 09/11/2022 in [facility] Video Visit from 05/13/2022  in [facility] Video Visit from 02/26/2022 in [facility]  AIMS Total Score 0 0 0 0 0      GAD-7    Flowsheet Row Office Visit from 10/22/2023 in [facility] Office Visit from 11/20/2022 in [facility] Office Visit from 09/11/2022 in [facility] Video Visit from 05/13/2022 in [facility] Video Visit from 02/26/2022 in [facility]  Total GAD-7 Score 14 4 9 3 6       PHQ2-9    Flowsheet Row Office Visit from 10/22/2023 in [facility] Counselor from 04/02/2023 in [facility] Visit from 11/20/2022 in [facility] Video Visit from 10/24/2022 in [facility] Video Visit from 10/14/2022 in [facility]  PHQ-2 Total Score 2 2 2 5 2   PHQ-9 Total Score 8 17 5 11 5       Flowsheet Row Video Visit from 01/29/2024 in [facility] Video Visit from 12/12/2023 in [facility] Video Visit from 11/11/2023 in [facility]  C-SSRS RISK CATEGORY No Risk No Risk No Risk        Assessment and Plan: Jamie [name] is a 58 year old Caucasian female who has a history of MDD, chronic  pain, pulmonary embolism was evaluated by telemedicine today.  Patient continues to have situational stressors which has been contributing to anxiety, patient noncompliant with referral for CBT, discussed assessment and plan as noted below.  Major Depressive Disorder - improving Generalized anxiety disorder- unstable Insomnia-improving Experiencing significant fatigue, potentially related to depression, menopause, or medication side effects. Non-compliant with therapy recommendations. Managing situational stressors from separation and work issues. Emphasized the importance of therapy and scheduling promptly due to long wait times. - Schedule therapy appointment with Ms.Kristina Julien Girt - Message front desk to schedule therapy appointment - Continue Amitriptyline 125 mg daily - Continue Rexulti 3 mg daily - Continue hydroxyzine 25 mg 3 times a day PRN - Follow up in two months with video visit on March 20th at 9:20 AM  Fatigue unspecified Persistent fatigue. Awaiting primary care evaluation to determine if related to menopause, medication side effects, or depression. Need to review all medications and medical conditions. - Follow up with primary care physician for fatigue evaluation   Follow-up - Follow up in two months with video visit on March 20th at 9:20 AM.  Collaboration of Care: Collaboration of Care: Patient encouraged to establish care with therapist.  Patient encouraged to follow up with primary care provider has upcoming appointment.  Patient/Guardian was advised Release of Information must be obtained prior to any record release in order to collaborate their care with an outside provider. Patient/Guardian was advised if they have not already done so to contact the registration department to sign all necessary forms in order for [place] to release information regarding their care.   Consent: Patient/Guardian gives verbal consent for treatment and assignment of benefits for services  provided during this visit. Patient/Guardian expressed understanding and agreed to proceed.   This note was generated in part or whole with voice recognition software. Voice recognition is usually quite accurate but there are transcription errors that can and very often do occur. I apologize for any typographical errors that  were not detected and corrected.    Jomarie Longs, MD 01/29/2024, 10:28 PM

## 2024-02-05 ENCOUNTER — Other Ambulatory Visit: Payer: Self-pay | Admitting: Psychiatry

## 2024-02-05 DIAGNOSIS — F331 Major depressive disorder, recurrent, moderate: Secondary | ICD-10-CM

## 2024-02-09 ENCOUNTER — Inpatient Hospital Stay: Payer: 59 | Attending: Oncology

## 2024-02-09 DIAGNOSIS — Z86711 Personal history of pulmonary embolism: Secondary | ICD-10-CM | POA: Insufficient documentation

## 2024-02-12 ENCOUNTER — Telehealth: Payer: Self-pay

## 2024-02-12 NOTE — Telephone Encounter (Signed)
Yes , need more information . Please let her know if she needs a sooner appointment to let us know.

## 2024-02-12 NOTE — Telephone Encounter (Signed)
pt had left a message that she seen her pcp and that he thinks that her anxiety medication is what is making her feel tired she wants to speak with doctor about changed her medication

## 2024-02-12 NOTE — Telephone Encounter (Signed)
left message asking pt to call our office back to give more detail on what is going on.

## 2024-02-13 NOTE — Telephone Encounter (Signed)
called pt she states that pcp states that it may be the hydroxyzine that is causing the issues and if that can be changed to something else.

## 2024-02-13 NOTE — Telephone Encounter (Signed)
left message stating that this was my 2nd attempt in calling her back to get more information. i also left a message that i would add her to the wait list in casue someone canceled maybe she can be seen sooner.

## 2024-02-13 NOTE — Telephone Encounter (Signed)
Please schedule this patient for a sooner appointment , we have openings on 02/16/2024.

## 2024-02-16 ENCOUNTER — Inpatient Hospital Stay: Payer: 59 | Admitting: Oncology

## 2024-02-16 ENCOUNTER — Encounter: Payer: Self-pay | Admitting: Oncology

## 2024-02-16 ENCOUNTER — Ambulatory Visit: Payer: 59

## 2024-02-24 ENCOUNTER — Other Ambulatory Visit: Payer: Self-pay | Admitting: Oncology

## 2024-03-15 ENCOUNTER — Other Ambulatory Visit: Payer: Self-pay | Admitting: Psychiatry

## 2024-03-15 DIAGNOSIS — F331 Major depressive disorder, recurrent, moderate: Secondary | ICD-10-CM

## 2024-03-15 DIAGNOSIS — F411 Generalized anxiety disorder: Secondary | ICD-10-CM

## 2024-03-18 ENCOUNTER — Encounter: Payer: Self-pay | Admitting: Psychiatry

## 2024-03-18 ENCOUNTER — Telehealth (INDEPENDENT_AMBULATORY_CARE_PROVIDER_SITE_OTHER): Payer: Self-pay | Admitting: Psychiatry

## 2024-03-18 DIAGNOSIS — F3341 Major depressive disorder, recurrent, in partial remission: Secondary | ICD-10-CM | POA: Diagnosis not present

## 2024-03-18 DIAGNOSIS — F411 Generalized anxiety disorder: Secondary | ICD-10-CM

## 2024-03-18 DIAGNOSIS — G4701 Insomnia due to medical condition: Secondary | ICD-10-CM | POA: Diagnosis not present

## 2024-03-18 MED ORDER — AMITRIPTYLINE HCL 25 MG PO TABS: 25.0000 mg | ORAL_TABLET | Freq: Every day | ORAL | 1 refills | Status: DC

## 2024-03-18 NOTE — Progress Notes (Signed)
 Virtual Visit via Video Note  I connected with Jamie [name] on 03/18/24 at  9:20 AM EDT by a video enabled telemedicine application and verified that I am speaking with the correct person using two identifiers.  Location Provider Location : [facility] Patient Location : Work  Participants: Patient , Provider    I discussed the limitations of evaluation and management by telemedicine and the availability of in person appointments. The patient expressed understanding and agreed to proceed.    I discussed the assessment and treatment plan with the patient. The patient was provided an opportunity to ask questions and all were answered. The patient agreed with the plan and demonstrated an understanding of the instructions.   The patient was advised to call back or seek an in-person evaluation if the symptoms worsen or if the condition fails to improve as anticipated.   BH MD OP Progress Note  03/18/2024 9:42 AM Jamie [name]  MRN:  [id number]  Chief Complaint:  Chief Complaint  Patient presents with   Follow-up   Depression   Anxiety   Medication Refill   Insomnia   HPI: Jamie [name] is a 58 year old Caucasian female, married, lives in [place], has a history of MDD, insomnia, chronic pain was evaluated by telemedicine today.  She continues to experience depression, insomnia, and generalized anxiety. Her sleep pattern remains stable, with bedtime between 8:30 and 9:30 PM and waking at 4 AM. She is currently taking amitriptyline and rexulti, and uses hydroxyzine as needed, approximately two to three times a week.  Denies suicidal thoughts or thoughts of self-harm . She is working full-time and trying to maintain a positive outlook despite challenges in her personal life, including communication issues with her husband, who is now living with her again. She has started a therapy program through her company and has her first appointment scheduled for next Tuesday.  She is concerned  about her anxiety, particularly related to her upcoming gallbladder surgery, which causes chest pain and difficulty focusing at work. She is scheduled to see a surgeon on April 15th regarding her gallbladder removal. She plans to take time off work for the surgery and has support from her husband and best friend. She is building up her PTO to ensure she has enough time off.  She experiences ongoing fatigue, which she attributes to her psychotropic medications. She has been informed by other doctors that these medications might be contributing to her fatigue.  She is more concerned about the hydroxyzine.  She however has been using it only 3 times a week or so and is agreeable to hold it for now unless she has significant anxiety symptoms.  She is not interested in further medication adjustment at this time.  Denies any homicidality or perceptual disturbances.  Visit Diagnosis:    ICD-10-CM   1. MDD (major depressive disorder), recurrent, in partial remission (HCC)  F33.41 amitriptyline (ELAVIL) 25 MG tablet    2. Insomnia due to medical condition  G47.01    Multifactorial including pain    3. GAD (generalized anxiety disorder)  F41.1       Past Psychiatric History: I have reviewed past psychiatric history from progress note on 03/08/2019.  Past trials of medications like venlafaxine, zolpidem, Seroquel, propranolol, Prozac, Cymbalta, doxepin, Celexa, carbamazepine, Abilify  Past Medical History:  Past Medical History:  Diagnosis Date   Anxiety    Brachial neuritis    Cervical cancer (HCC)    Chronic neck pain    Chronic pain  not due to malignancy    Depressive disorder    followed by Dr. Mordecai Rasmussen.   Disturbance, sleep    DVT (deep venous thrombosis) (HCC)    left arm   Elevated blood pressure reading without diagnosis of hypertension    Embolism and thrombosis of splenic artery    H/O thrombophlebitis    Infarction of spleen    Lumbosacral neuritis    Obesity, Class II, BMI  35-39.9, with comorbidity     Past Surgical History:  Procedure Laterality Date   CESAREAN SECTION  09/24/2010   IVC FILTER INSERTION N/A 05/24/2021   Procedure: IVC FILTER INSERTION;  Surgeon: Annice Needy, MD;  Location: [facility] INVASIVE CV LAB;  Service: Cardiovascular;  Laterality: N/A;   IVC FILTER REMOVAL N/A 07/18/2021   Procedure: IVC FILTER REMOVAL;  Surgeon: Annice Needy, MD;  Location: [facility] INVASIVE CV LAB;  Service: Cardiovascular;  Laterality: N/A;   TONSILLECTOMY AND ADENOIDECTOMY  09/24/2010   TOTAL HIP ARTHROPLASTY Right 05/29/2021   Procedure: TOTAL HIP ARTHROPLASTY ANTERIOR APPROACH;  Surgeon: Kennedy Bucker, MD;  Location: [facility] ORS;  Service: Orthopedics;  Laterality: Right;   TUBAL LIGATION  09/24/2010    Family Psychiatric History: I have reviewed family psychiatric history from progress note on 03/08/2019.  Family History:  Family History  Problem Relation Age of Onset   Anxiety disorder Mother    Depression Mother    Alcohol abuse Brother    Depression Daughter     Social History: I have reviewed social history from progress note on 03/08/2019. Social History   Socioeconomic History   Marital status: Married    Spouse name: rodney   Number of children: 3   Years of education: Not on file   Highest education level: Some college, no degree  Occupational History    Comment: full time  Tobacco Use   Smoking status: Never   Smokeless tobacco: Never  Vaping Use   Vaping status: Never Used  Substance and Sexual Activity   Alcohol use: Yes    Alcohol/week: 0.0 standard drinks of alcohol    Comment: social   Drug use: Not Currently    Types: Marijuana   Sexual activity: Yes    Partners: Male    Birth control/protection: Surgical    Comment: Tubal ligation   Other Topics Concern   Not on file  Social History Narrative   Not on file   Social Drivers of Health   Financial Resource Strain: Low Risk  (02/09/2024)   Received [facility]    Overall Financial Resource Strain (CARDIA)    Difficulty of Paying Living Expenses: Not hard at all  Food Insecurity: No Food Insecurity (02/09/2024)   Received from [facility]   Hunger Vital Sign    Worried About Running Out of Food in the Last Year: Never true    Ran Out of Food in the Last Year: Never true  Transportation Needs: No Transportation Needs (02/09/2024)   Received from [facility] - Transportation    In the past 12 months, has lack of transportation kept you from medical appointments or from getting medications?: No    Lack of Transportation (Non-Medical): No  Physical Activity: Inactive (02/03/2018)   Exercise Vital Sign    Days of Exercise per Week: 0 days    Minutes of Exercise per Session: 0 min  Stress: Stress Concern Present (02/03/2018)   [employer] of Occupational Health -  Occupational Stress Questionnaire    Feeling of Stress : Very much  Social Connections: Somewhat Isolated (02/03/2018)   Social Connection and Isolation Panel [NHANES]    Frequency of Communication with Friends and Family: More than three times a week    Frequency of Social Gatherings with Friends and Family: Never    Attends Religious Services: Never    Database administrator or Organizations: No    Attends Banker Meetings: Never    Marital Status: Married    Allergies:  Allergies  Allergen Reactions   Hydrocodone-Acetaminophen Nausea Only   Neuromuscular Blocking Agents Other (See Comments)    Don't work    Metabolic Disorder Labs: Lab Results  Component Value Date   HGBA1C 5.6 10/31/2021   MPG 114.02 10/31/2021   Lab Results  Component Value Date   PROLACTIN 13.5 10/31/2021   No results found for: "CHOL", "TRIG", "HDL", "CHOLHDL", "VLDL", "LDLCALC" No results found for: "TSH"  Therapeutic Level Labs: No results found for: "LITHIUM" No results found for: "VALPROATE" Lab Results  Component Value Date   CBMZ  5.4 11/01/2022    Current Medications: Current Outpatient Medications  Medication Sig Dispense Refill   acetaminophen (TYLENOL) 325 MG tablet Take 2 tablets (650 mg total) by mouth every 6 (six) hours as needed for mild pain (pain score 1-3), headache or fever.     amitriptyline (ELAVIL) 100 MG tablet TAKE 1 TABLET BY MOUTH AT BEDTIME. TAKE ALONG WITH 25 MG DAILY 30 tablet 0   amitriptyline (ELAVIL) 25 MG tablet Take 1 tablet (25 mg total) by mouth at bedtime. Take along with 100 mg 30 tablet 1   Brexpiprazole (REXULTI) 3 MG TABS Take 1 tablet (3 mg total) by mouth daily with supper. 30 tablet 3   butalbital-acetaminophen-caffeine (FIORICET) 50-325-40 MG tablet Take 1 tablet by mouth every 6 (six) hours as needed (headache refractory to tylenol). 14 tablet 0   hydrOXYzine (VISTARIL) 25 MG capsule TAKE 1 CAPSULE BY MOUTH THREE TIMES DAILY AS NEEDED FOR ANXIETY AND FOR SLEEP 90 capsule 3   naloxone (NARCAN) nasal spray 4 mg/0.1 mL as needed.     ondansetron (ZOFRAN) 4 MG tablet Take 1 tablet (4 mg total) by mouth every 6 (six) hours as needed for nausea. (Patient not taking: Reported on 12/12/2023) 20 tablet 0   polyethylene glycol (MIRALAX / GLYCOLAX) 17 g packet Take 17 g by mouth daily. 30 each 0   RIVAROXABAN (XARELTO) VTE STARTER PACK (15 & 20 MG) Follow package directions: Take one 15mg  tablet by mouth twice a day. On day 22, switch to one 20mg  tablet once a day. Take with food. 51 each 0   No current facility-administered medications for this visit.     Musculoskeletal: Strength & Muscle Tone:  UTA Gait & Station:  Seated Patient leans: N/A  Psychiatric Specialty Exam: Review of Systems  Psychiatric/Behavioral:  Positive for sleep disturbance. The patient is nervous/anxious.     Last menstrual period 02/18/2018.There is no height or weight on file to calculate BMI.  General Appearance: Fairly Groomed  Eye Contact:  Fair  Speech:  Clear and Coherent  Volume:  Normal  Mood:   Anxious  Affect:  Congruent  Thought Process:  Goal Directed and Descriptions of Associations: Intact  Orientation:  Full (Time, Place, and Person)  Thought Content: Logical   Suicidal Thoughts:  No  Homicidal Thoughts:  No  Memory:  Immediate;   Fair Recent;   Fair Remote;  Fair  Judgement:  Fair  Insight:  Fair  Psychomotor Activity:  Normal  Concentration:  Concentration: Fair and Attention Span: Fair  Recall:  [employer] of Knowledge: Fair  Language: Fair  Akathisia:  No  Handed:  Right  AIMS (if indicated): not done  Assets:  Desire for Improvement Housing Social Support Transportation  ADL's:  Intact  Cognition: WNL  Sleep:   varies   Screenings: Geneticist, molecular Office Visit from 11/20/2022 in [facility] Video Visit from 10/14/2022 in [facility] Office Visit from 09/11/2022 in [facility] Video Visit from 05/13/2022 in [facility] Video Visit from 02/26/2022 in [facility]  AIMS Total Score 0 0 0 0 0      GAD-7    Flowsheet Row Office Visit from 10/22/2023 in [facility] Office Visit from 11/20/2022 in [facility] Office Visit from 09/11/2022 in [facility] Video Visit from 05/13/2022 in [facility] Video Visit from 02/26/2022 in [facility]  Total GAD-7 Score 14 4 9 3 6       PHQ2-9    Flowsheet Row Office Visit from 10/22/2023 in [facility] Counselor from 04/02/2023 in [facility] Visit from 11/20/2022 in [facility] Video Visit  from 10/24/2022 in [facility] Video Visit from 10/14/2022 in [facility]  PHQ-2 Total Score 2 2 2 5 2   PHQ-9 Total Score 8 17 5 11 5       Flowsheet Row Video Visit from 03/18/2024 in [facility] Video Visit from 01/29/2024 in [facility] Video Visit from 12/12/2023 in [facility]  C-SSRS RISK CATEGORY No Risk No Risk No Risk        Assessment and Plan: Jamie [name] is a 58 year old Caucasian female who has a history of MDD, chronic pain, pulmonary embolism was evaluated by telemedicine today.  Discussed assessment and plan as noted below.  Major Depressive Disorder in partial remission Major depressive disorder managed with medication and therapy. No suicidal ideation. Engaged in employer-provided therapy program, starting next Tuesday. Maintaining positivity and work responsibilities. - Continue Amitriptyline 125 mg daily - Continue Rexulti 3 mg daily - Attend therapy sessions provided by employer  Generalized Anxiety Disorder-unstable Ongoing anxiety related to health concerns and upcoming gallbladder surgery, affecting work focus. Hydroxyzine used as needed, but not frequently enough to cause daily fatigue. Considering medication reduction to address fatigue. - Hold off on hydroxyzine unless necessary, currently prescribed as 25 mg 3 times a day as needed. - Continue Amitriptyline 125 mg daily - Consider reducing or adjusting Amitriptyline or Rexulti if fatigue persists - Patient encouraged to attend psychotherapy through her employer.  Insomnia-improving Adequate sleep with early waking due to work schedule. Advised to go to bed earlier for sufficient rest. - Encourage going to bed earlier to ensure adequate rest - Continue sleep hygiene techniques.  Follow-up Surgical  consultation for gallbladder surgery on April 15th. Next psychiatric follow-up in late April. - Schedule video follow-up appointment on April 29th - Consider in-office visit in the future    Collaboration of Care: Collaboration of Care:  Referral or follow-up with counselor/therapist AEB patient encouraged to establish care with therapist.  Patient/Guardian was advised Release of Information must be obtained prior to any record release in order to collaborate their care with an outside provider. Patient/Guardian was advised if they have not already done so to contact the registration department to sign all necessary forms in order for [place] to release information regarding their care.   Consent: Patient/Guardian gives verbal consent for treatment and assignment of benefits for services provided during this visit. Patient/Guardian expressed understanding and agreed to proceed.  This note was generated in part or whole with voice recognition software. Voice recognition is usually quite accurate but there are transcription errors that can and very often do occur. I apologize for any typographical errors that were not detected and corrected. Discussed the use of a AI scribe software for clinical note transcription with the patient, who gave verbal consent to proceed.      Jomarie Longs, MD 03/18/2024, 9:42 AM

## 2024-03-22 ENCOUNTER — Other Ambulatory Visit: Payer: Self-pay

## 2024-03-22 ENCOUNTER — Emergency Department
Admission: EM | Admit: 2024-03-22 | Discharge: 2024-03-22 | Disposition: A | Discharge status: Home / Self Care | Attending: Emergency Medicine | Admitting: Emergency Medicine

## 2024-03-22 ENCOUNTER — Encounter: Payer: Self-pay | Admitting: Emergency Medicine

## 2024-03-22 ENCOUNTER — Emergency Department

## 2024-03-22 DIAGNOSIS — M7122 Synovial cyst of popliteal space [Baker], left knee: Secondary | ICD-10-CM | POA: Insufficient documentation

## 2024-03-22 DIAGNOSIS — M25462 Effusion, left knee: Secondary | ICD-10-CM | POA: Diagnosis present

## 2024-03-22 DIAGNOSIS — M25562 Pain in left knee: Secondary | ICD-10-CM

## 2024-03-22 NOTE — ED Triage Notes (Signed)
 Patient to ED via POV for left leg pain/ swelling x3 days. Hx of blood clots. Not currently on blood thinners.

## 2024-03-22 NOTE — ED Provider Triage Note (Addendum)
 Emergency Medicine Provider Triage Evaluation Note  [name] , a 58 y.o. female  was evaluated in triage.  Pt complains of left leg pain, history of DVT.  Review of Systems  Positive:  Negative:   Physical Exam  BP (!) 136/92   Pulse 96   Temp 98 F (36.7 C) (Oral)   Resp 18   Ht 5\' 2"  (1.575 m)   Wt 71 kg   LMP 02/18/2018 (Approximate)   SpO2 99%   BMI 28.63 kg/m  Gen:   Awake, no distress   Resp:  Normal effort  MSK:   Moves extremities without difficulty. Left tleg not tender to palpation, skin intact, pulses positive  Other:    Medical Decision Making  Medically screening exam initiated at 3:51 PM.  Appropriate orders placed.  Chrystie Nose was informed that the remainder of the evaluation will be completed by another provider, this initial triage assessment does not replace that evaluation, and the importance of remaining in the ED until their evaluation is complete.   Patient with left leg pain, ordered US Yarnell Arvidson, PA-C 03/22/24 1552    Gladys Damme, PA-C 03/22/24 1552

## 2024-03-22 NOTE — ED Provider Notes (Signed)
 [facility] Emergency Department Provider Note     Event Date/Time   First MD Initiated Contact with Patient 03/22/24 1816     (approximate)   History   Leg Pain   HPI  Jamie [name] is a 58 y.o. female with a history of thrombophlebitis and DVT of the upper extremity presents to the ED for evaluation of left leg pain and swelling in the knee cap area x 2-3 days.  Given patient's history of UE DVT, splenic embolism and PE patient wanted to rule out DVT of the lower extremity.  Patient reports pain is worse with ambulation and radiates down to left ankle and up toward her left thigh along the sciatica region. No hx of LE DVTs. Denies chest pain and shortness of breath.     Physical Exam   Triage Vital Signs: ED Triage Vitals  Encounter Vitals Group     BP 03/22/24 1549 (!) 136/92     Systolic BP Percentile --      Diastolic BP Percentile --      Pulse Rate 03/22/24 1549 96     Resp 03/22/24 1549 18     Temp 03/22/24 1549 98 F (36.7 C)     Temp Source 03/22/24 1549 Oral     SpO2 03/22/24 1549 99 %     Weight 03/22/24 1548 156 lb 8.4 oz (71 kg)     Height 03/22/24 1548 5\' 2"  (1.575 m)     Head Circumference --      Peak Flow --      Pain Score 03/22/24 1549 8     Pain Loc --      Pain Education --      Exclude from Growth Chart --     Most recent vital signs: Vitals:   03/22/24 1549  BP: (!) 136/92  Pulse: 96  Resp: 18  Temp: 98 F (36.7 C)  SpO2: 99%    General Awake, no distress.  HEENT NCAT. PERRL. EOMI. No rhinorrhea. Mucous membranes are moist.  CV:  Good peripheral perfusion.  RESP:  Normal effort.  ABD:  No distention.  Other:  Left knee reveals mild anterior swelling.  No erythema.  Full ROM without difficulty.  Nontender to palpation over anterior aspect.  Negative Homans' sign.  No pitting edema.  Patient is amatory and gait is steady.   ED Results / Procedures / Treatments   Labs (all labs ordered are listed, but  only abnormal results are displayed) Labs Reviewed - No data to display   RADIOLOGY  I personally viewed and evaluated these images as part of my medical decision making, as well as reviewing the written report by the radiologist.  ED Provider Interpretation: No DVT   US Venous Img Lower Unilateral Left Result Date: 03/22/2024 CLINICAL DATA:  leg swelling EXAM: LEFT LOWER EXTREMITY VENOUS DOPPLER ULTRASOUND TECHNIQUE: Gray-scale sonography with compression, as well as color and duplex ultrasound, were performed to evaluate the deep venous system(s) from the level of the common femoral vein through the popliteal and proximal calf veins. COMPARISON:  CT AP, 10/26/2023 FINDINGS: VENOUS Normal compressibility of the common femoral, superficial femoral, and popliteal veins, as well as the visualized calf veins. Visualized portions of profunda femoral vein and great saphenous vein unremarkable. No filling defects to suggest DVT on grayscale or color Doppler imaging. Doppler waveforms show normal direction of venous flow, normal respiratory plasticity and response to augmentation. Limited views of the contralateral common  femoral vein are unremarkable. OTHER No evidence of superficial thrombophlebitis. At the posterior LEFT knee within the fossa is a well-circumscribed anechoic and avascular fluid collection measuring approximately 2.5 x 2.2 x 0.8 cm, consistent with a popliteal fossa/Baker cyst. Limitations: none IMPRESSION: 1. No evidence of femoropopliteal DVT or superficial thrombophlebitis within the LEFT lower extremity. 2. 2 cm LEFT popliteal fossa/Baker cyst. Jamie Banning, MD [facility] Radiology Electronically Signed   By: Jamie [name] M.D.   On: 03/22/2024 17:17    PROCEDURES:  Critical Care performed: No  Procedures   MEDICATIONS ORDERED IN ED: Medications - No data to display   IMPRESSION / MDM / ASSESSMENT AND PLAN / ED COURSE  I  reviewed the triage vital signs and the nursing notes.                                 58 y.o. female presents to the emergency department for evaluation and treatment of acute left knee swelling without injury. See HPI for further details.   Differential diagnosis includes, but is not limited to DVT, bursitis, septic joint  Patient's presentation is most consistent with acute complicated illness / injury requiring diagnostic workup.  Patient is alert and oriented.  She is hemodynamically stable and afebrile.  Physical exam findings are stated above.  No visible deformity of the left knee.  Negative Homans' sign.  Ultrasound reveals no DVT.  Incidental finding of Baker's cyst.  Education of this pathology provided to patient.  Patient is in stable condition for discharge home.  Encouraged follow-up with PCP for further evaluation if needed.  ED precautions discussed.   FINAL CLINICAL IMPRESSION(S) / ED DIAGNOSES   Final diagnoses:  Posterior left knee pain  Baker's cyst of knee, left   Rx / DC Orders   ED Discharge Orders     None        Note:  This document was prepared using Dragon voice recognition software and may include unintentional dictation errors.    Jamie [name], Jamie [name] A, PA-C 03/22/24 Jamie Beals, MD 03/22/24 [phone]

## 2024-03-22 NOTE — Discharge Instructions (Addendum)
 You were evaluated in the ED for left knee pain and swelling your ultrasound did not show a DVT of this leg. The ultrasound did reveal a baker cyst. This can spontaneously resolve on its own. Please review "Baker Cyst" education packet attached to your discharge papers.   Get plenty of rest and limited physical activity on the leg until improvement. Take ibuprofen or tylenol as needed for pain. Elevate above heart level as much as possible to help with swelling.

## 2024-03-22 NOTE — ED Triage Notes (Signed)
 First nurse note: Pt here for L calf pain X 2-3 days, area red and swollen, pt denies CP or SOB. VSS.

## 2024-04-15 ENCOUNTER — Ambulatory Visit: Payer: Self-pay | Admitting: General Surgery

## 2024-04-15 NOTE — H&P (Signed)
 History of Present Illness Jamie [name] is a 58 year old female with chronic cholecystitis who presents for evaluation of persistent abdominal pain.  She has persistent right upper quadrant pain that began in October 2024, leading to a hospital admission. An ultrasound on October 26, 2023, revealed gallbladder sludge, and a positive Murphy's sign was noted. The pain occurs about 80% of the time when she eats, with a sensation of pressure in her chest that she feels compelled to relieve by 'beating on her chest'. The pain is localized to the right side and does not radiate.  This have been going on for at least 6 months.  Aggravating factor is heating.  No alleviating factors.  During her hospital stay, she was diagnosed with a deep vein thrombosis (DVT) and pulmonary embolism (PE). She was prescribed blood thinners, which she discontinued approximately two and a half weeks ago due to financial constraints. She attempted to seek financial assistance but found the costs prohibitive.  She has not undergone an endoscopy and is unsure if she has had one in the past. She wants to 'get back better' and is interested in proceeding with surgery to address her gallbladder issues.       PAST MEDICAL HISTORY:  Past Medical History:  Diagnosis Date   Anxiety    Depression    GAD (generalized anxiety disorder)    Lumbosacral neuritis    MDD (major depressive disorder)    Obesity    Spleen disorder         PAST SURGICAL HISTORY:   Past Surgical History:  Procedure Laterality Date   CESAREAN SECTION  2004   CESAREAN SECTION  2010   Right THA Right 05/29/2021   ARTHROPLASTY HIP TOTAL Right 05/29/2021   [facility]   TONSILLECTOMY           MEDICATIONS:  Outpatient Encounter Medications as of 04/15/2024  Medication Sig Dispense Refill   acetaminophen (TYLENOL) 325 MG tablet Take 650 mg by mouth every 4 (four) hours as needed for Pain or Fever (or headache)     amitriptyline (ELAVIL) 100 MG tablet  TAKE 1 TABLET BY MOUTH AT BEDTIME. TAKE ALONG WITH 25 MG DAILY     amitriptyline (ELAVIL) 25 MG tablet Take 25 mg by mouth     naloxone (NARCAN) 4 mg/actuation nasal spray Place 4 mg into one nostril as needed     REXULTI 3 mg Tab Take 3 mg by mouth at bedtime     No facility-administered encounter medications on file as of 04/15/2024.     ALLERGIES:   Vicodin [hydrocodone-acetaminophen]   SOCIAL HISTORY:  Social History   Socioeconomic History   Marital status: Married    Spouse name: Nasiya Pascual   Number of children: 3   Years of education: 12+   Highest education level: Some college, no degree  Occupational History   Occupation: Theatre stage manager  Tobacco Use   Smoking status: Never   Smokeless tobacco: Never  Vaping Use   Vaping status: Never Used  Substance and Sexual Activity   Alcohol use: Not Currently   Drug use: Never   Sexual activity: Yes    Partners: Male   Social Drivers of Health   Financial Resource Strain: Low Risk  (02/09/2024)   Overall Financial Resource Strain (CARDIA)    Difficulty of Paying Living Expenses: Not hard at all  Food Insecurity: No Food Insecurity (02/09/2024)   Hunger Vital Sign    Worried About Running Out of  Food in the Last Year: Never true    Ran Out of Food in the Last Year: Never true  Transportation Needs: No Transportation Needs (02/09/2024)   PRAPARE - Administrator, Civil Service (Medical): No    Lack of Transportation (Non-Medical): No    FAMILY HISTORY:  Family History  Problem Relation Name Age of Onset   Anxiety Mother     Depression Mother     Alcohol abuse Brother       GENERAL REVIEW OF SYSTEMS:   General ROS: negative for - chills, fatigue, fever, weight gain or weight loss Allergy and Immunology ROS: negative for - hives  Hematological and Lymphatic ROS: negative for - bleeding problems or bruising, negative for palpable nodes Endocrine ROS: negative for - heat or  cold intolerance, hair changes Respiratory ROS: negative for - cough, shortness of breath or wheezing Cardiovascular ROS: no chest pain or palpitations GI ROS: negative for nausea, vomiting, positive for abdominal pain Musculoskeletal ROS: negative for - joint swelling or muscle pain Neurological ROS: negative for - confusion, syncope Dermatological ROS: negative for pruritus and rash  PHYSICAL EXAM:  Vitals:   04/15/24 1335  BP: (!) 140/89  Pulse: 96  .  Ht:160 cm (5\' 3" ) Wt:81.6 kg (180 lb) VOZ:DGUY surface area is 1.9 meters squared. Body mass index is 31.89 kg/m.Aaron Aas   GENERAL: Alert, active, oriented x3  HEENT: Pupils equal reactive to light. Extraocular movements are intact. Sclera clear. Palpebral conjunctiva normal red color.Pharynx clear.  NECK: Supple with no palpable mass and no adenopathy.  LUNGS: Sound clear with no rales rhonchi or wheezes.  HEART: Regular rhythm S1 and S2 without murmur.  ABDOMEN: Soft and depressible, nontender with no palpable mass, no hepatomegaly.   EXTREMITIES: Well-developed well-nourished symmetrical with no dependent edema.  NEUROLOGICAL: Awake alert oriented, facial expression symmetrical, moving all extremities.   Results RADIOLOGY Endoscopic ultrasound: Gallbladder sludge (10/26/2023) Pulmonary embolism: Pulmonary embolism (09/2023)    Assessment & Plan Chronic calculous cholecystitis   She experiences chronic right upper quadrant pain, worsened by eating, consistent with gallbladder sludge. An ultrasound in October 2024 showed a positive Murphy's sign. Although the pain radiates to the chest, which is atypical, it is not uncommon for gallbladder issues. While gastritis and reflux are considered, the gallbladder is the most likely source of pain. Laparoscopic cholecystectomy is indicated to alleviate symptoms. This minimally invasive procedure involves four small incisions and typically allows for same-day discharge. Postoperative  recovery should improve symptoms, with a recommended two-week recovery period before returning to work. Proceed with laparoscopic cholecystectomy. Advise maintaining a healthy diet, avoiding fatty and greasy foods. Recommend two weeks off work for recovery post-surgery.  Acute pulmonary embolism (PE)   She has an idiopathic PE requiring lifelong anticoagulation. Discontinuation of apixaban due to financial constraints raises concerns about clot recurrence. Lifelong anticoagulation is necessary due to the idiopathic nature of the PE, indicating a predisposition to clot formation without an identifiable cause. Contact a hematologist to discuss alternative anticoagulation options considering financial constraints. Explore more affordable anticoagulation options.  Surgical follow-up   She is eager to proceed with surgery to improve her quality of life. Surgery is planned within one to two weeks, pending coordination with a hematologist regarding anticoagulation management. Schedule laparoscopic cholecystectomy within one to two weeks. Coordinate with the hematologist regarding anticoagulation management prior to surgery.   CCC (chronic calculous cholecystitis) [K80.10]         Patient verbalized understanding, all questions were answered,  and were agreeable with the plan outlined above.   Eldred Grego, MD  Electronically signed by Eldred Grego, MD

## 2024-04-16 ENCOUNTER — Telehealth: Payer: Self-pay

## 2024-04-16 NOTE — Telephone Encounter (Signed)
 Recevied message from Surgeon Dr. Cesar, stating that pt has upcoming surgery and had stopped her Eliquis  approx 2 weeks ago, and will need to resume after surgery. Dr. Citnron is asking for pt to follow up with Dr. Babara so she can be restarted on Eliquis  after surgery.  Will complete Eliquis  patient assistance when pt comes to clinic.   Please contact pt to r/s follow up appt Lab/MD. To be scheduled prior to surgery (4/28).

## 2024-04-19 ENCOUNTER — Encounter
Admission: EM | Disposition: A | Discharge status: Home / Self Care | Payer: Self-pay | Source: Home / Self Care | Attending: Emergency Medicine

## 2024-04-19 ENCOUNTER — Emergency Department

## 2024-04-19 ENCOUNTER — Ambulatory Visit
Admission: EM | Admit: 2024-04-19 | Discharge: 2024-04-19 | Disposition: A | Discharge status: Home / Self Care | Attending: Emergency Medicine | Admitting: Emergency Medicine

## 2024-04-19 ENCOUNTER — Emergency Department: Admitting: Urgent Care

## 2024-04-19 ENCOUNTER — Encounter: Payer: Self-pay | Admitting: General Surgery

## 2024-04-19 ENCOUNTER — Other Ambulatory Visit: Payer: Self-pay

## 2024-04-19 ENCOUNTER — Ambulatory Visit: Admit: 2024-04-19 | Admitting: General Surgery

## 2024-04-19 ENCOUNTER — Inpatient Hospital Stay
Admission: RE | Admit: 2024-04-19 | Discharge: 2024-04-19 | Disposition: A | Discharge status: Home / Self Care | Source: Ambulatory Visit

## 2024-04-19 DIAGNOSIS — Z86711 Personal history of pulmonary embolism: Secondary | ICD-10-CM | POA: Insufficient documentation

## 2024-04-19 DIAGNOSIS — I10 Essential (primary) hypertension: Secondary | ICD-10-CM | POA: Insufficient documentation

## 2024-04-19 DIAGNOSIS — K811 Chronic cholecystitis: Secondary | ICD-10-CM | POA: Diagnosis present

## 2024-04-19 DIAGNOSIS — K8010 Calculus of gallbladder with chronic cholecystitis without obstruction: Secondary | ICD-10-CM | POA: Insufficient documentation

## 2024-04-19 DIAGNOSIS — Z87891 Personal history of nicotine dependence: Secondary | ICD-10-CM | POA: Insufficient documentation

## 2024-04-19 LAB — COMPREHENSIVE METABOLIC PANEL WITH GFR
ALT: 20 U/L (ref 0–44)
AST: 26 U/L (ref 15–41)
Albumin: 4.1 g/dL (ref 3.5–5.0)
Alkaline Phosphatase: 69 U/L (ref 38–126)
Anion gap: 10 (ref 5–15)
BUN: 16 mg/dL (ref 6–20)
CO2: 25 mmol/L (ref 22–32)
Calcium: 9.2 mg/dL (ref 8.9–10.3)
Chloride: 104 mmol/L (ref 98–111)
Creatinine, Ser: 0.81 mg/dL (ref 0.44–1.00)
GFR, Estimated: >60 mL/min (ref >60)
Glucose, Bld: 87 mg/dL (ref 70–99)
Potassium: 3.9 mmol/L (ref 3.5–5.1)
Sodium: 139 mmol/L (ref 135–145)
Total Bilirubin: 0.7 mg/dL (ref 0.0–1.2)
Total Protein: 7.9 g/dL (ref 6.5–8.1)

## 2024-04-19 LAB — CBC
HCT: 41.6 % (ref 36.0–46.0)
Hemoglobin: 13.6 g/dL (ref 12.0–15.0)
MCH: 29.2 pg (ref 26.0–34.0)
MCHC: 32.7 g/dL (ref 30.0–36.0)
MCV: 89.3 fL (ref 80.0–100.0)
Platelets: 445 10*3/uL — ABNORMAL HIGH (ref 150–400)
RBC: 4.66 MIL/uL (ref 3.87–5.11)
RDW: 13.1 % (ref 11.5–15.5)
WBC: 9.4 10*3/uL (ref 4.0–10.5)
nRBC: 0 % (ref 0.0–0.2)

## 2024-04-19 LAB — BASIC METABOLIC PANEL WITH GFR
Anion gap: 9 (ref 5–15)
BUN: 17 mg/dL (ref 6–20)
CO2: 26 mmol/L (ref 22–32)
Calcium: 9.1 mg/dL (ref 8.9–10.3)
Chloride: 106 mmol/L (ref 98–111)
Creatinine, Ser: 0.88 mg/dL (ref 0.44–1.00)
GFR, Estimated: >60 mL/min (ref >60)
Glucose, Bld: 93 mg/dL (ref 70–99)
Potassium: 3.6 mmol/L (ref 3.5–5.1)
Sodium: 141 mmol/L (ref 135–145)

## 2024-04-19 LAB — TROPONIN I (HIGH SENSITIVITY)
Troponin I (High Sensitivity): 3 ng/L (ref <18)
Troponin I (High Sensitivity): 3 ng/L (ref <18)

## 2024-04-19 SURGERY — CHOLECYSTECTOMY, ROBOT-ASSISTED, LAPAROSCOPIC
Anesthesia: General | Site: Abdomen

## 2024-04-19 MED ORDER — CEFAZOLIN SODIUM-DEXTROSE 2-4 GM/100ML-% IV SOLN
INTRAVENOUS | Status: AC
  Filled 2024-04-19: qty 100

## 2024-04-19 MED ORDER — INDOCYANINE GREEN 25 MG IV SOLR
1.2500 mg | Freq: Once | INTRAVENOUS | Status: AC
  Administered 2024-04-19: 1.25 mg via INTRAVENOUS

## 2024-04-19 MED ORDER — IOHEXOL 350 MG/ML SOLN
75.0000 mL | Freq: Once | INTRAVENOUS | Status: AC | PRN
  Administered 2024-04-19: 75 mL via INTRAVENOUS

## 2024-04-19 MED ORDER — MORPHINE SULFATE (PF) 4 MG/ML IV SOLN: 4.0000 mg | Freq: Once | INTRAVENOUS | Status: DC

## 2024-04-19 MED ORDER — CEFAZOLIN SODIUM-DEXTROSE 2-4 GM/100ML-% IV SOLN
2.0000 g | INTRAVENOUS | Status: AC
  Administered 2024-04-19: 2 g via INTRAVENOUS

## 2024-04-19 MED ORDER — FENTANYL CITRATE (PF) 100 MCG/2ML IJ SOLN
INTRAMUSCULAR | Status: DC | PRN
  Administered 2024-04-19: 50 ug via INTRAVENOUS

## 2024-04-19 MED ORDER — PROPOFOL 10 MG/ML IV BOLUS
INTRAVENOUS | Status: AC
  Filled 2024-04-19: qty 60

## 2024-04-19 MED ORDER — PROPOFOL 10 MG/ML IV BOLUS
INTRAVENOUS | Status: DC | PRN
Start: 2024-04-19 — End: 2024-04-19
  Administered 2024-04-19: 150 mg via INTRAVENOUS
  Administered 2024-04-19: 25 ug/kg/min via INTRAVENOUS

## 2024-04-19 MED ORDER — DROPERIDOL 2.5 MG/ML IJ SOLN
0.6250 mg | Freq: Once | INTRAMUSCULAR | Status: AC | PRN
  Administered 2024-04-19: 0.625 mg via INTRAVENOUS

## 2024-04-19 MED ORDER — OXYCODONE HCL 5 MG PO TABS
5.0000 mg | ORAL_TABLET | Freq: Once | ORAL | Status: AC
  Administered 2024-04-19: 5 mg via ORAL

## 2024-04-19 MED ORDER — SODIUM CHLORIDE 0.9 % IV BOLUS
1000.0000 mL | Freq: Once | INTRAVENOUS | Status: AC
  Administered 2024-04-19: 1000 mL via INTRAVENOUS

## 2024-04-19 MED ORDER — DROPERIDOL 2.5 MG/ML IJ SOLN
INTRAMUSCULAR | Status: AC
  Filled 2024-04-19: qty 2

## 2024-04-19 MED ORDER — LACTATED RINGERS IV SOLN: INTRAVENOUS | Status: DC | PRN

## 2024-04-19 MED ORDER — INDOCYANINE GREEN 25 MG IV SOLR
INTRAVENOUS | Status: AC
  Filled 2024-04-19: qty 10

## 2024-04-19 MED ORDER — FENTANYL CITRATE (PF) 100 MCG/2ML IJ SOLN
25.0000 ug | INTRAMUSCULAR | Status: DC | PRN
Start: 2024-04-19 — End: 2024-04-19
  Administered 2024-04-19 (×3): 25 ug via INTRAVENOUS

## 2024-04-19 MED ORDER — FENTANYL CITRATE (PF) 100 MCG/2ML IJ SOLN
INTRAMUSCULAR | Status: AC
  Filled 2024-04-19: qty 2

## 2024-04-19 MED ORDER — ACETAMINOPHEN 10 MG/ML IV SOLN
INTRAVENOUS | Status: AC
  Filled 2024-04-19: qty 100

## 2024-04-19 MED ORDER — ACETAMINOPHEN 10 MG/ML IV SOLN
INTRAVENOUS | Status: DC | PRN
  Administered 2024-04-19: 1000 mg via INTRAVENOUS

## 2024-04-19 MED ORDER — MIDAZOLAM HCL 2 MG/2ML IJ SOLN
INTRAMUSCULAR | Status: DC | PRN
  Administered 2024-04-19: 2 mg via INTRAVENOUS

## 2024-04-19 MED ORDER — MIDAZOLAM HCL 2 MG/2ML IJ SOLN
INTRAMUSCULAR | Status: AC
  Filled 2024-04-19: qty 2

## 2024-04-19 MED ORDER — ONDANSETRON HCL 4 MG/2ML IJ SOLN
INTRAMUSCULAR | Status: DC | PRN
  Administered 2024-04-19: 4 mg via INTRAVENOUS

## 2024-04-19 MED ORDER — ONDANSETRON HCL 4 MG/2ML IJ SOLN
INTRAMUSCULAR | Status: AC
  Filled 2024-04-19: qty 2

## 2024-04-19 MED ORDER — MORPHINE SULFATE (PF) 4 MG/ML IV SOLN
4.0000 mg | Freq: Once | INTRAVENOUS | Status: AC
  Administered 2024-04-19: 4 mg via INTRAVENOUS
  Filled 2024-04-19: qty 1

## 2024-04-19 MED ORDER — 0.9 % SODIUM CHLORIDE (POUR BTL) OPTIME
TOPICAL | Status: DC | PRN
  Administered 2024-04-19: 500 mL

## 2024-04-19 MED ORDER — BUPIVACAINE-EPINEPHRINE (PF) 0.25% -1:200000 IJ SOLN
INTRAMUSCULAR | Status: AC
Start: 2024-04-19 — End: ?
  Filled 2024-04-19: qty 30

## 2024-04-19 MED ORDER — DEXAMETHASONE SODIUM PHOSPHATE 10 MG/ML IJ SOLN
INTRAMUSCULAR | Status: AC
  Filled 2024-04-19: qty 1

## 2024-04-19 MED ORDER — KETAMINE HCL 50 MG/5ML IJ SOSY
PREFILLED_SYRINGE | INTRAMUSCULAR | Status: AC
  Filled 2024-04-19: qty 5

## 2024-04-19 MED ORDER — BUPIVACAINE-EPINEPHRINE 0.25% -1:200000 IJ SOLN
INTRAMUSCULAR | Status: DC | PRN
  Administered 2024-04-19: 30 mL

## 2024-04-19 MED ORDER — ONDANSETRON HCL 4 MG/2ML IJ SOLN
4.0000 mg | Freq: Once | INTRAMUSCULAR | Status: AC
  Administered 2024-04-19: 4 mg via INTRAVENOUS
  Filled 2024-04-19: qty 2

## 2024-04-19 MED ORDER — KETAMINE HCL 50 MG/5ML IJ SOSY
PREFILLED_SYRINGE | INTRAMUSCULAR | Status: DC | PRN
  Administered 2024-04-19: 20 mg via INTRAVENOUS

## 2024-04-19 MED ORDER — OXYCODONE HCL 5 MG PO TABS: 5.0000 mg | ORAL_TABLET | Freq: Three times a day (TID) | ORAL | 0 refills | Status: DC | PRN

## 2024-04-19 MED ORDER — ROCURONIUM BROMIDE 100 MG/10ML IV SOLN
INTRAVENOUS | Status: DC | PRN
  Administered 2024-04-19: 70 mg via INTRAVENOUS

## 2024-04-19 MED ORDER — OXYCODONE HCL 5 MG PO TABS
ORAL_TABLET | ORAL | Status: AC
  Filled 2024-04-19: qty 1

## 2024-04-19 MED ORDER — SUCCINYLCHOLINE CHLORIDE 200 MG/10ML IV SOSY
PREFILLED_SYRINGE | INTRAVENOUS | Status: AC
  Filled 2024-04-19: qty 10

## 2024-04-19 MED ORDER — ROCURONIUM BROMIDE 10 MG/ML (PF) SYRINGE
PREFILLED_SYRINGE | INTRAVENOUS | Status: AC
  Filled 2024-04-19: qty 10

## 2024-04-19 MED ORDER — HYDROMORPHONE HCL 1 MG/ML IJ SOLN
0.5000 mg | Freq: Once | INTRAMUSCULAR | Status: AC
  Administered 2024-04-19: 0.5 mg via INTRAVENOUS
  Filled 2024-04-19: qty 0.5

## 2024-04-19 MED ORDER — LIDOCAINE HCL (PF) 2 % IJ SOLN
INTRAMUSCULAR | Status: AC
  Filled 2024-04-19: qty 5

## 2024-04-19 MED ORDER — SUGAMMADEX SODIUM 200 MG/2ML IV SOLN
INTRAVENOUS | Status: DC | PRN
  Administered 2024-04-19: 200 mg via INTRAVENOUS

## 2024-04-19 MED ORDER — DEXAMETHASONE SODIUM PHOSPHATE 10 MG/ML IJ SOLN
INTRAMUSCULAR | Status: DC | PRN
Start: 2024-04-19 — End: 2024-04-19
  Administered 2024-04-19: 10 mg via INTRAVENOUS

## 2024-04-19 MED ORDER — LIDOCAINE HCL (CARDIAC) PF 100 MG/5ML IV SOSY
PREFILLED_SYRINGE | INTRAVENOUS | Status: DC | PRN
Start: 2024-04-19 — End: 2024-04-19
  Administered 2024-04-19: 80 mg via INTRAVENOUS

## 2024-04-19 SURGICAL SUPPLY — 42 items
BAG PRESSURE INF REUSE 1000 (BAG) IMPLANT
CANNULA REDUCER 12-8 DVNC XI (CANNULA) ×1 IMPLANT
CATH REDDICK CHOLANGI 4FR 50CM (CATHETERS) IMPLANT
CAUTERY HOOK MNPLR 1.6 DVNC XI (INSTRUMENTS) ×1 IMPLANT
CLIP LIGATING HEM O LOK PURPLE (MISCELLANEOUS) IMPLANT
CLIP LIGATING HEMO O LOK GREEN (MISCELLANEOUS) ×1 IMPLANT
DERMABOND ADVANCED .7 DNX12 (GAUZE/BANDAGES/DRESSINGS) ×1 IMPLANT
DRAPE ARM DVNC X/XI (DISPOSABLE) ×4 IMPLANT
DRAPE C-ARM XRAY 36X54 (DRAPES) IMPLANT
DRAPE COLUMN DVNC XI (DISPOSABLE) ×1 IMPLANT
ELECTRODE REM PT RTRN 9FT ADLT (ELECTROSURGICAL) ×1 IMPLANT
FORCEPS BPLR 8 MD DVNC XI (FORCEP) ×1 IMPLANT
FORCEPS BPLR FENES DVNC XI (FORCEP) ×1 IMPLANT
FORCEPS PROGRASP DVNC XI (FORCEP) ×1 IMPLANT
GLOVE BIO SURGEON STRL SZ 6.5 (GLOVE) ×2 IMPLANT
GLOVE BIOGEL PI IND STRL 6.5 (GLOVE) ×2 IMPLANT
GOWN STRL REUS W/ TWL LRG LVL3 (GOWN DISPOSABLE) ×3 IMPLANT
GRASPER SUT TROCAR 14GX15 (MISCELLANEOUS) ×1 IMPLANT
IRRIGATOR SUCT 8 DISP DVNC XI (IRRIGATION / IRRIGATOR) IMPLANT
IV CATH ANGIO 12GX3 LT BLUE (NEEDLE) IMPLANT
IV NS 1000ML BAXH (IV SOLUTION) IMPLANT
KIT PINK PAD W/HEAD ARE REST (MISCELLANEOUS) ×1 IMPLANT
KIT PINK PAD W/HEAD ARM REST (MISCELLANEOUS) ×1 IMPLANT
LABEL OR SOLS (LABEL) ×1 IMPLANT
MANIFOLD NEPTUNE II (INSTRUMENTS) ×1 IMPLANT
NDL HYPO 22X1.5 SAFETY MO (MISCELLANEOUS) ×1 IMPLANT
NDL INSUFFLATION 14GA 120MM (NEEDLE) ×1 IMPLANT
NEEDLE HYPO 22X1.5 SAFETY MO (MISCELLANEOUS) ×1 IMPLANT
NEEDLE INSUFFLATION 14GA 120MM (NEEDLE) ×1 IMPLANT
NS IRRIG 500ML POUR BTL (IV SOLUTION) ×1 IMPLANT
OBTURATOR OPTICALSTD 8 DVNC (TROCAR) ×1 IMPLANT
PACK LAP CHOLECYSTECTOMY (MISCELLANEOUS) ×1 IMPLANT
SEAL UNIV 5-12 XI (MISCELLANEOUS) ×4 IMPLANT
SET TUBE SMOKE EVAC HIGH FLOW (TUBING) ×1 IMPLANT
SOLUTION ELECTROSURG ANTI STCK (MISCELLANEOUS) ×1 IMPLANT
SPIKE FLUID TRANSFER (MISCELLANEOUS) ×2 IMPLANT
SPONGE T-LAP 4X18 ~~LOC~~+RFID (SPONGE) IMPLANT
SUT VICRYL 0 UR6 27IN ABS (SUTURE) ×1 IMPLANT
SUTURE MNCRL 4-0 27XMF (SUTURE) ×1 IMPLANT
SYSTEM BAG RETRIEVAL 10MM (BASKET) ×1 IMPLANT
TRAP FLUID SMOKE EVACUATOR (MISCELLANEOUS) ×1 IMPLANT
WATER STERILE IRR 500ML POUR (IV SOLUTION) ×1 IMPLANT

## 2024-04-19 NOTE — ED Notes (Signed)
 See triage notes. Patient c/o right sided chest pain that started in the middle of the night. Patient is scheduled for gallbladder surgery on the 28th of the month.

## 2024-04-19 NOTE — H&P (Signed)
 SURGICAL CONSULTATION NOTE   HISTORY OF PRESENT ILLNESS (HPI):  58 y.o. female presented to [facility] ED for evaluation of chest pain. Patient reports chest pain since last night.  She endorses that the chest pain radiates to the right upper quadrant of the abdomen.  She has been having these episodes.  6 months ago.  They have been getting more frequent and more intense.  She denies palpitations.  She has been diagnosed with chronic cholecystitis.  In the past she was found with incidental PE and hematology recommended uninterrupted anticoagulation for 6 months.  Patient had the uninterrupted anticoagulation for 36-month but stopped the anticoagulation 3 weeks ago due to economic difficulty.  At the ED she was found with stable vital signs, no fever.  There is pain in the right upper quadrant.  Labs shows no leukocytosis.  Normal AST/ALT and bilirubin levels.  She had a CT scan angio of the chest that was negative for PE.  I personally evaluated the images.  She does receive pain medication for her chronic cholecystitis but did not improve her pain.  This is consistent with acute cholecystitis.  Surgery is consulted by Dr. Alejo Amsler in this context for evaluation and management of acute cholecystitis.  PAST MEDICAL HISTORY (PMH):  Past Medical History:  Diagnosis Date   Anxiety    Brachial neuritis    Cervical cancer (HCC)    Chronic neck pain    Chronic pain not due to malignancy    Depressive disorder    followed by Dr. Alroy Aspen.   Disturbance, sleep    DVT (deep venous thrombosis) (HCC)    left arm   Elevated blood pressure reading without diagnosis of hypertension    Embolism and thrombosis of splenic artery    H/O thrombophlebitis    Infarction of spleen    Lumbosacral neuritis    Obesity, Class II, BMI 35-39.9, with comorbidity      PAST SURGICAL HISTORY (PSH):  Past Surgical History:  Procedure Laterality Date   CESAREAN SECTION  09/24/2010   IVC FILTER INSERTION N/A 05/24/2021    Procedure: IVC FILTER INSERTION;  Surgeon: Celso College, MD;  Location: [facility] INVASIVE CV LAB;  Service: Cardiovascular;  Laterality: N/A;   IVC FILTER REMOVAL N/A 07/18/2021   Procedure: IVC FILTER REMOVAL;  Surgeon: Celso College, MD;  Location: [facility] INVASIVE CV LAB;  Service: Cardiovascular;  Laterality: N/A;   TONSILLECTOMY AND ADENOIDECTOMY  09/24/2010   TOTAL HIP ARTHROPLASTY Right 05/29/2021   Procedure: TOTAL HIP ARTHROPLASTY ANTERIOR APPROACH;  Surgeon: Molli Angelucci, MD;  Location: [facility] ORS;  Service: Orthopedics;  Laterality: Right;   TUBAL LIGATION  09/24/2010     MEDICATIONS:  Prior to Admission medications   Medication Sig Start Date End Date Taking? Authorizing Provider  acetaminophen  (TYLENOL ) 325 MG tablet Take 2 tablets (650 mg total) by mouth every 6 (six) hours as needed for mild pain (pain score 1-3), headache or fever. 10/21/23   Darus Engels A, DO  amitriptyline  (ELAVIL ) 100 MG tablet TAKE 1 TABLET BY MOUTH AT BEDTIME. TAKE ALONG WITH 25 MG DAILY 03/16/24   Eappen, Saramma, MD  amitriptyline  (ELAVIL ) 25 MG tablet Take 1 tablet (25 mg total) by mouth at bedtime. Take along with 100 mg 03/18/24   Eappen, Saramma, MD  Brexpiprazole  (REXULTI ) 3 MG TABS Take 1 tablet (3 mg total) by mouth daily with supper. 01/20/24   Eappen, Saramma, MD  butalbital -acetaminophen -caffeine  (FIORICET ) 50-325-40 MG tablet Take 1 tablet by mouth every 6 (  six) hours as needed (headache refractory to tylenol ). 10/21/23   Darus Engels A, DO  hydrOXYzine  (VISTARIL ) 25 MG capsule TAKE 1 CAPSULE BY MOUTH THREE TIMES DAILY AS NEEDED FOR ANXIETY AND FOR SLEEP 01/05/24   Eappen, Saramma, MD  naloxone [facility]) nasal spray 4 mg/0.1 mL as needed. 10/21/23   [provider]  ondansetron  (ZOFRAN ) 4 MG tablet Take 1 tablet (4 mg total) by mouth every 6 (six) hours as needed for nausea. Patient not taking: Reported on 12/12/2023 10/21/23   Darus Engels A, DO  polyethylene glycol (MIRALAX  / GLYCOLAX ) 17  g packet Take 17 g by mouth daily. 10/22/23   Montey Apa, DO  RIVAROXABAN  (XARELTO ) VTE STARTER PACK (15 & 20 MG) Follow package directions: Take one 15mg  tablet by mouth twice a day. On day 22, switch to one 20mg  tablet once a day. Take with food. 11/10/23   Timmy Forbes, MD     ALLERGIES:  Allergies  Allergen Reactions   Hydrocodone-Acetaminophen  Nausea Only   Neuromuscular Blocking Agents Other (See Comments)    Don't work     SOCIAL HISTORY:  Social History   Socioeconomic History   Marital status: Married    Spouse name: rodney   Number of children: 3   Years of education: Not on file   Highest education level: Some college, no degree  Occupational History    Comment: full time  Tobacco Use   Smoking status: Never   Smokeless tobacco: Never  Vaping Use   Vaping status: Never Used  Substance and Sexual Activity   Alcohol use: Yes    Alcohol/week: 0.0 standard drinks of alcohol    Comment: social   Drug use: Not Currently    Types: Marijuana   Sexual activity: Yes    Partners: Male    Birth control/protection: Surgical    Comment: Tubal ligation   Other Topics Concern   Not on file  Social History Narrative   Not on file   Social Drivers of Health   Financial Resource Strain: Low Risk  (02/09/2024)   Received from [facility]   Overall Financial Resource Strain (CARDIA)    Difficulty of Paying Living Expenses: Not hard at all  Food Insecurity: No Food Insecurity (02/09/2024)   Received from [facility]   Hunger Vital Sign    Worried About Running Out of Food in the Last Year: Never true    Ran Out of Food in the Last Year: Never true  Transportation Needs: No Transportation Needs (02/09/2024)   Received from [facility] - Transportation    In the past 12 months, has lack of transportation kept you from medical appointments or from getting medications?: No    Lack of Transportation  (Non-Medical): No  Physical Activity: Inactive (02/03/2018)   Exercise Vital Sign    Days of Exercise per Week: 0 days    Minutes of Exercise per Session: 0 min  Stress: Stress Concern Present (02/03/2018)   [employer] of Occupational Health - Occupational Stress Questionnaire    Feeling of Stress : Very much  Social Connections: Somewhat Isolated (02/03/2018)   Social Connection and Isolation Panel [NHANES]    Frequency of Communication with Friends and Family: More than three times a week    Frequency of Social Gatherings with Friends and Family: Never    Attends Religious Services: Never    Database administrator or Organizations: No    Attends  Club or Organization Meetings: Never    Marital Status: Married  Catering manager Violence: Not At Risk (10/26/2023)   Humiliation, Afraid, Rape, and Kick questionnaire    Fear of Current or Ex-Partner: No    Emotionally Abused: No    Physically Abused: No    Sexually Abused: No     FAMILY HISTORY:  Family History  Problem Relation Age of Onset   Anxiety disorder Mother    Depression Mother    Alcohol abuse Brother    Depression Daughter      REVIEW OF SYSTEMS:  Constitutional: denies weight loss, fever, chills, or sweats  Eyes: denies any other vision changes, history of eye injury  ENT: denies sore throat, hearing problems  Respiratory: denies shortness of breath, wheezing  Cardiovascular: Positive chest pain, negative palpitations  Gastrointestinal: positive abdominal pain, nausea and vomitnig Genitourinary: denies burning with urination or urinary frequency Musculoskeletal: denies any other joint pains or cramps  Skin: denies any other rashes or skin discolorations  Neurological: denies any other headache, dizziness, weakness  Psychiatric: denies any other depression, anxiety   All other review of systems were negative   VITAL SIGNS:  Temp:  [98 F (36.7 C)] 98 F (36.7 C) (04/21 0849) Pulse Rate:  [99] 99 (04/21  0849) Resp:  [18] 18 (04/21 0849) BP: (156)/(81) 156/81 (04/21 0849) SpO2:  [99 %] 99 % (04/21 0849) Weight:  [73.5 kg] 73.5 kg (04/21 0848)     Height: 5\' 3"  (160 cm) Weight: 73.5 kg BMI (Calculated): 28.7   INTAKE/OUTPUT:  This shift: No intake/output data recorded.  Last 2 shifts: @IOLAST2SHIFTS @   PHYSICAL EXAM:  Constitutional:  -- Normal body habitus  -- Awake, alert, and oriented x3  Eyes:  -- Pupils equally round and reactive to light  -- No scleral icterus  Ear, nose, and throat:  -- No jugular venous distension  Pulmonary:  -- No crackles  -- Equal breath sounds bilaterally -- Breathing non-labored at rest Cardiovascular:  -- S1, S2 present  -- No pericardial rubs Gastrointestinal:  -- Abdomen soft, tender in the right upper quadrant, non-distended, no guarding or rebound tenderness -- No abdominal masses appreciated, pulsatile or otherwise  Musculoskeletal and Integumentary:  -- Wounds: None appreciated -- Extremities: B/L UE and LE FROM, hands and feet warm, no edema  Neurologic:  -- Motor function: intact and symmetric -- Sensation: intact and symmetric   Labs:     Latest Ref Rng & Units 04/19/2024    8:49 AM 10/27/2023    4:59 AM 10/26/2023   12:39 PM  CBC  WBC 4.0 - 10.5 K/uL 9.4  7.1  8.4   Hemoglobin 12.0 - 15.0 g/dL 16.1  09.6  04.5   Hematocrit 36.0 - 46.0 % 41.6  35.8  40.7   Platelets 150 - 400 K/uL 445  325  373       Latest Ref Rng & Units 04/19/2024   11:37 AM 04/19/2024    8:49 AM 10/27/2023    4:59 AM  CMP  Glucose 70 - 99 mg/dL 87  93  89   BUN 6 - 20 mg/dL 16  17  14    Creatinine 0.44 - 1.00 mg/dL 4.09  8.11  9.14   Sodium 135 - 145 mmol/L 139  141  141   Potassium 3.5 - 5.1 mmol/L 3.9  3.6  3.8   Chloride 98 - 111 mmol/L 104  106  104   CO2 22 - 32 mmol/L 25  26  26   Calcium 8.9 - 10.3 mg/dL 9.2  9.1  8.8   Total Protein 6.5 - 8.1 g/dL 7.9   6.3   Total Bilirubin 0.0 - 1.2 mg/dL 0.7   0.5   Alkaline Phos 38 - 126 U/L 69   76    AST 15 - 41 U/L 26   72   ALT 0 - 44 U/L 20   81     Imaging studies:  CLINICAL DATA:  Pulmonary embolism (PE) suspected, high prob. Right chest pain and epigastric pain.   EXAM: CT ANGIOGRAPHY CHEST WITH CONTRAST   TECHNIQUE: Multidetector CT imaging of the chest was performed using the standard protocol during bolus administration of intravenous contrast. Multiplanar CT image reconstructions and MIPs were obtained to evaluate the vascular anatomy.   RADIATION DOSE REDUCTION: This exam was performed according to the departmental dose-optimization program which includes automated exposure control, adjustment of the mA and/or kV according to patient size and/or use of iterative reconstruction technique.   CONTRAST:  75mL OMNIPAQUE  IOHEXOL  350 MG/ML SOLN   COMPARISON:  10/17/2023   FINDINGS: Cardiovascular: No filling defects in the pulmonary arteries to suggest pulmonary emboli. Heart is normal size. Aorta is normal caliber. Scattered coronary artery calcifications.   Mediastinum/Nodes: No mediastinal, hilar, or axillary adenopathy. Trachea and esophagus are unremarkable. Thyroid unremarkable.   Lungs/Pleura: Linear density at the left base is stable, most compatible with scarring. No acute confluent opacities or effusions.   Upper Abdomen: No acute findings   Musculoskeletal: Chest wall soft tissues are unremarkable. No acute bony abnormality.   Review of the MIP images confirms the above findings.   IMPRESSION: No acute cardiopulmonary disease.  No evidence of pulmonary embolus.   Coronary artery disease.     Electronically Signed   By: Janeece Mechanic M.D.   On: 04/19/2024 13:52  Assessment/Plan:  58 y.o. female with acute cholecystitis, complicated by pertinent comorbidities including history of PE.  Patient with history, physical exam consistent with acute cholecystitis. Patient oriented about diagnosis and surgical management as treatment.   Discussed  the risk of surgery including post-op infxn, seroma, biloma, chronic pain, poor-delayed wound healing, retained gallstone, conversion to open procedure, post-op SBO or ileus, and need for additional procedures to address said risks.  The risks of general anesthetic including MI, CVA, sudden death or even reaction to anesthetic medications also discussed. Alternatives include continued observation.  Benefits include possible symptom relief, prevention of complications including acute cholecystitis, pancreatitis.  Lucila Rye, MD

## 2024-04-19 NOTE — ED Triage Notes (Signed)
 Pt comes with cp that started in middle of night. Pt states it is right sided epigastric. Pt states she is also schedule for gallbladder surgery on the 28th. Pt states no sob

## 2024-04-19 NOTE — Discharge Summary (Signed)
 Patient ID: Jamie [name] MRN: [id number] DOB/AGE: 04-13-1966 58 y.o.  Admit date: 04/19/2024 Discharge date: 04/19/2024   Discharge Diagnoses:  Active Problems:   * No active hospital problems. *   Procedures: Robotic assisted laparoscopic cholecystectomy  Hospital Course: Patient presented to the ED with chest pain and right upper quadrant pain.  He has been getting more frequent and more intense.  She was found with chronic cholecystitis.  She had history of PE 6 months ago but new CTA was negative for PE.  It was decided to take her to the [facility] for robotic cholecystectomy to avoid recurrent visits to the ED.  She tolerated the procedure well.  She has been recovering slowly.  She is tolerating diet.  Pain control.  Incisions without any complications.  Physical Exam Vitals reviewed.  Constitutional:      Appearance: She is well-developed.  HENT:     Head: Normocephalic.  Cardiovascular:     Rate and Rhythm: Normal rate and regular rhythm.     Heart sounds: Normal heart sounds.  Abdominal:     General: Bowel sounds are normal.     Palpations: Abdomen is soft.  Musculoskeletal:     Cervical back: Normal range of motion.  Skin:    General: Skin is warm.     Capillary Refill: Capillary refill takes less than 2 seconds.  Neurological:     Mental Status: She is alert.      Consults: None  Disposition: Discharge disposition: 01-Home or Self Care       Discharge Instructions     Diet - low sodium heart healthy   Complete by: As directed    Diet - low sodium heart healthy   Complete by: As directed    Increase activity slowly   Complete by: As directed    Increase activity slowly   Complete by: As directed       Allergies as of 04/19/2024       Reactions   Hydrocodone-acetaminophen  Nausea Only   Neuromuscular Blocking Agents Other (See Comments)   Don't work        Medication List     TAKE these medications    acetaminophen  325 MG  tablet Commonly known as: TYLENOL  Take 2 tablets (650 mg total) by mouth every 6 (six) hours as needed for mild pain (pain score 1-3), headache or fever.   amitriptyline  100 MG tablet Commonly known as: ELAVIL  TAKE 1 TABLET BY MOUTH AT BEDTIME. TAKE ALONG WITH 25 MG DAILY   amitriptyline  25 MG tablet Commonly known as: ELAVIL  Take 1 tablet (25 mg total) by mouth at bedtime. Take along with 100 mg   butalbital -acetaminophen -caffeine  50-325-40 MG tablet Commonly known as: FIORICET  Take 1 tablet by mouth every 6 (six) hours as needed (headache refractory to tylenol ).   hydrOXYzine  25 MG capsule Commonly known as: VISTARIL  TAKE 1 CAPSULE BY MOUTH THREE TIMES DAILY AS NEEDED FOR ANXIETY AND FOR SLEEP   naloxone 4 MG/0.1ML Liqd nasal spray kit Commonly known as: NARCAN as needed.   ondansetron  4 MG tablet Commonly known as: ZOFRAN  Take 1 tablet (4 mg total) by mouth every 6 (six) hours as needed for nausea.   oxyCODONE  5 MG immediate release tablet Commonly known as: Roxicodone  Take 1 tablet (5 mg total) by mouth every 8 (eight) hours as needed.   polyethylene glycol 17 g packet Commonly known as: MIRALAX  / GLYCOLAX  Take 17 g by mouth daily.   Rexulti  3 MG Tabs Generic  drug: Brexpiprazole  Take 1 tablet (3 mg total) by mouth daily with supper.   Rivaroxaban  Starter Pack (15 mg and 20 mg) Commonly known as: XARELTO  STARTER PACK Follow package directions: Take one 15mg  tablet by mouth twice a day. On day 22, switch to one 20mg  tablet once a day. Take with food.        Follow-up Information     Eldred Grego, MD Follow up in 2 week(s).   Specialty: General Surgery Contact information: [address] [place] [address] [phone]

## 2024-04-19 NOTE — Anesthesia Postprocedure Evaluation (Signed)
 Anesthesia Post Note  Patient: Jamie [name]  Procedure(s) Performed: CHOLECYSTECTOMY, ROBOT-ASSISTED, LAPAROSCOPIC (Abdomen)  Patient location during evaluation: PACU Anesthesia Type: General Level of consciousness: awake and alert Pain management: pain level controlled Vital Signs Assessment: post-procedure vital signs reviewed and stable Respiratory status: spontaneous breathing, nonlabored ventilation, respiratory function stable and patient connected to nasal cannula oxygen Cardiovascular status: blood pressure returned to baseline and stable Postop Assessment: no apparent nausea or vomiting Anesthetic complications: no   No notable events documented.   Last Vitals:  Vitals:   04/19/24 1624 04/19/24 1630  BP:  135/84  Pulse: 65 (!) 58  Resp: 16 17  Temp:    SpO2: 99% 99%    Last Pain:  Vitals:   04/19/24 1630  TempSrc:   PainSc: 5                  Portia Brittle [name]

## 2024-04-19 NOTE — ED Provider Notes (Signed)
 [facility] Provider Note   Event Date/Time   First MD Initiated Contact with Patient 04/19/24 [phone]     (approximate) History  Chest Pain  HPI Jamie [name] is a 58 y.o. female with a past medical history of chronic calculus cholecystitis with plans for surgery in 1 week, diagnosed PE on anticoagulation, IVC filter in place who presents complaining of epigastric abdominal pain with associated diarrhea and nausea.  Patient states that this pain has been present over the last evening.  Patient states that she has had multiple bowel movements this morning that has been loose without any blood or dark tarry material.  Patient has not eaten anything and therefore does not know if food or drinks worsen this pain.  Patient states that approximately 80% of the food she eats causes the similar epigastric and right upper quadrant abdominal pain. ROS: Patient currently denies any vision changes, tinnitus, difficulty speaking, facial droop, sore throat, chest pain, shortness of breath, vomiting, dysuria, or weakness/numbness/paresthesias in any extremity   Physical Exam  Triage Vital Signs: ED Triage Vitals  Encounter Vitals Group     BP 04/19/24 0849 (!) 156/81     Systolic BP Percentile --      Diastolic BP Percentile --      Pulse Rate 04/19/24 0849 99     Resp 04/19/24 0849 18     Temp 04/19/24 0849 98 F (36.7 C)     Temp src --      SpO2 04/19/24 0849 99 %     Weight 04/19/24 0848 162 lb (73.5 kg)     Height 04/19/24 0848 5\' 3"  (1.6 m)     Head Circumference --      Peak Flow --      Pain Score 04/19/24 0848 10     Pain Loc --      Pain Education --      Exclude from Growth Chart --    Most recent vital signs: Vitals:   04/19/24 1624 04/19/24 1630  BP:  135/84  Pulse: 65 (!) 58  Resp: 16 17  Temp:    SpO2: 99% 99%   General: Awake, oriented x4. CV:  Good peripheral perfusion.  Resp:  Normal effort.  Abd:  No distention.  Nontender to  palpation Other:  Middle-aged overweight Caucasian female resting comfortably in no acute distress ED Results / Procedures / Treatments  Labs (all labs ordered are listed, but only abnormal results are displayed) Labs Reviewed  CBC - Abnormal; Notable for the following components:      Result Value   Platelets 445 (*)    All other components within normal limits  BASIC METABOLIC PANEL WITH GFR  COMPREHENSIVE METABOLIC PANEL WITH GFR  SURGICAL PATHOLOGY  TROPONIN I (HIGH SENSITIVITY)  TROPONIN I (HIGH SENSITIVITY)   RADIOLOGY ED MD interpretation: 2 view chest x-ray interpreted by me shows no evidence of acute abnormalities including no pneumonia, pneumothorax, or widened mediastinum  CT angiography of the chest shows no evidence of acute abnormalities.  Imaging independently interpreted -Agree with radiology assessment Official radiology report(s): No results found. PROCEDURES: Critical Care performed: No Procedures MEDICATIONS ORDERED IN ED: Medications  morphine  (PF) 4 MG/ML injection 4 mg (4 mg Intravenous Given 04/19/24 1135)  ondansetron  (ZOFRAN ) injection 4 mg (4 mg Intravenous Given 04/19/24 1135)  sodium chloride  0.9 % bolus 1,000 mL (1,000 mLs Intravenous New Bag/Given 04/19/24 1135)  iohexol  (OMNIPAQUE ) 350 MG/ML injection 75 mL (75 mLs Intravenous  Contrast Given 04/19/24 1146)  HYDROmorphone  (DILAUDID ) injection 0.5 mg (0.5 mg Intravenous Given 04/19/24 1404)  ceFAZolin  (ANCEF ) IVPB 2g/100 mL premix (2 g Intravenous Given 04/19/24 1500)  indocyanine green  (IC-GREEN ) injection 1.25 mg (1.25 mg Intravenous Given 04/19/24 1441)  droperidol  (INAPSINE ) 2.5 MG/ML injection 0.625 mg (0.625 mg Intravenous Given 04/19/24 1608)  oxyCODONE  (Oxy IR/ROXICODONE ) immediate release tablet 5 mg (5 mg Oral Given 04/19/24 1627)   IMPRESSION / MDM / ASSESSMENT AND PLAN / ED COURSE  I reviewed the triage vital signs and the nursing notes.                             The patient is on the  cardiac monitor to evaluate for evidence of arrhythmia and/or significant heart rate changes. Patient's presentation is most consistent with acute presentation with potential threat to life or bodily function. Workup: ECG, CXR, CBC, BMP, Troponin Findings: ECG: No overt evidence of STEMI. No evidence of Brugada's sign, delta wave, epsilon wave, significantly prolonged QTc, or malignant arrhythmia HS Troponin: Negative x1 Other Labs unremarkable for emergent problems. CXR: Without PTX, PNA, or widened mediastinum HEART Score: 3  Given History, Exam, and Workup I have low suspicion for ACS, Pneumothorax, Pneumonia, Pulmonary Embolus, Tamponade, Aortic Dissection or other emergent problem as a cause for this presentation.   Reassesment: In discussion with patient's surgeon, Dr. Charmel Cooter, if patient CT angiography of the chest negative, she will be on-call to the OR today for a cholecystectomy  Disposition: To the OR    FINAL CLINICAL IMPRESSION(S) / ED DIAGNOSES   Final diagnoses:  None   Rx / DC Orders   ED Discharge Orders          Ordered    oxyCODONE  (ROXICODONE ) 5 MG immediate release tablet  Every 8 hours PRN        04/19/24 1605    Diet - low sodium heart healthy        04/19/24 1605    Increase activity slowly        04/19/24 1605    Diet - low sodium heart healthy        04/19/24 1607    Increase activity slowly        04/19/24 1607           Note:  This document was prepared using Dragon voice recognition software and may include unintentional dictation errors.   Raza Bayless K, MD 04/22/24 [phone]

## 2024-04-19 NOTE — Transfer of Care (Signed)
 Immediate Anesthesia Transfer of Care Note  Patient: Jamie [name]  Procedure(s) Performed: CHOLECYSTECTOMY, ROBOT-ASSISTED, LAPAROSCOPIC (Abdomen)  Patient Location: PACU  Anesthesia Type:General  Level of Consciousness: awake  Airway & Oxygen Therapy: Patient Spontanous Breathing and Patient connected to face mask oxygen  Post-op Assessment: Report given to RN and Post -op Vital signs reviewed and stable  Post vital signs: Reviewed and stable  Last Vitals:  Vitals Value Taken Time  BP 157/91 04/19/24 1601  Temp    Pulse 72 04/19/24 1604  Resp 19 04/19/24 1604  SpO2 100 % 04/19/24 1604  Vitals shown include unfiled device data.  Last Pain:  Vitals:   04/19/24 1424  TempSrc: Temporal  PainSc: 10-Worst pain ever         Complications: No notable events documented.

## 2024-04-19 NOTE — Anesthesia Procedure Notes (Signed)
 Procedure Name: Intubation Date/Time: 04/19/2024 2:58 PM  Performed by: Aniceto Kern, RNPre-anesthesia Checklist: Patient identified, Emergency Drugs available, Suction available and Patient being monitored Patient Re-evaluated:Patient Re-evaluated prior to induction Oxygen Delivery Method: Circle system utilized Preoxygenation: Pre-oxygenation with 100% oxygen Induction Type: IV induction Ventilation: Mask ventilation without difficulty Laryngoscope Size: McGrath and 3 Grade View: Grade I Tube type: Oral Tube size: 7.0 mm Number of attempts: 1 Airway Equipment and Method: Stylet, Oral airway and Video-laryngoscopy Placement Confirmation: ETT inserted through vocal cords under direct vision, positive ETCO2 and breath sounds checked- equal and bilateral Secured at: 22 cm Tube secured with: Tape Dental Injury: Teeth and Oropharynx as per pre-operative assessment

## 2024-04-19 NOTE — Op Note (Signed)
 Preoperative diagnosis: Chronic cholecystitis  Postoperative diagnosis: Same  Procedure: Robotic Assisted Laparoscopic Cholecystectomy.   Anesthesia: GETA   Surgeon: Dr. Dortha Gauss  Wound Classification: Clean Contaminated  Indications: Patient is a 58 y.o. female developed right upper quadrant pain, nausea, vomiting and on workup was found to have cholelithiasis with a normal common duct with recurrent frequent pain. Robotic Assisted Laparoscopic cholecystectomy was elected.  Findings:  Critical view of safety achieved Cystic duct and artery identified, ligated and divided Adequate hemostasis           Description of procedure: The patient was placed on the operating table in the supine position. General anesthesia was induced. A time-out was completed verifying correct patient, procedure, site, positioning, and implant(s) and/or special equipment prior to beginning this procedure. An orogastric tube was placed. The abdomen was prepped and draped in the usual sterile fashion.  An incision was made in a natural skin line below the umbilicus.  The fascia was elevated and the Veress needle inserted. Proper position was confirmed by aspiration and saline meniscus test.  The abdomen was insufflated with carbon dioxide to a pressure of 15 mmHg. The patient tolerated insufflation well. A 8-mm trocar was then inserted in optiview fashion.  The laparoscope was inserted and the abdomen inspected. No injuries from initial trocar placement were noted. Additional trocars were then inserted in the following locations: an 8-mm trocar in the left lateral abdomen, and another two 8-mm trocars to the right side of the abdomen 5 cm appart. The umbilical trocar was changed to a 12 mm trocar all under direct visualization. The abdomen was inspected and no abnormalities were found. The table was placed in the reverse Trendelenburg position with the right side up. The robotic arms were docked and target  anatomy identified. Instrument inserted under direct visualization.  Filmy adhesions between the gallbladder and omentum, duodenum and transverse colon were lysed with electrocautery. The dome of the gallbladder was grasped with a prograsp and retracted over the dome of the liver. The infundibulum was also grasped with an atraumatic grasper and retracted toward the right lower quadrant. This maneuver exposed Calot's triangle. The peritoneum overlying the gallbladder infundibulum was then incised and the cystic duct and cystic artery identified and circumferentially dissected. Critical view of safety reviewed before ligating any structure. Firefly images taken to visualize biliary ducts. The cystic duct and cystic artery were then doubly clipped and divided close to the gallbladder.  The gallbladder was then dissected from its peritoneal attachments by electrocautery. Hemostasis was checked and the gallbladder and contained stones were removed using an endoscopic retrieval bag. The gallbladder was passed off the table as a specimen. There was no evidence of bleeding from the gallbladder fossa or cystic artery or leakage of the bile from the cystic duct stump. Secondary trocars were removed under direct vision. No bleeding was noted. The robotic arms were undoked. The scope was withdrawn and the umbilical trocar removed. The abdomen was allowed to collapse. The fascia of the 12mm trocar sites was closed with figure-of-eight 0 vicryl sutures. The skin was closed with subcuticular sutures of 4-0 monocryl and topical skin adhesive. The orogastric tube was removed.  The patient tolerated the procedure well and was taken to the postanesthesia care unit in stable condition.   Specimen: Gallbladder  Complications: None  EBL: 5 mL

## 2024-04-19 NOTE — Anesthesia Preprocedure Evaluation (Signed)
 Anesthesia Evaluation  Patient identified by MRN, date of birth, ID band Patient awake    Reviewed: Allergy & Precautions, NPO status , Patient's Chart, lab work & pertinent test results  History of Anesthesia Complications Negative for: history of anesthetic complications  Airway Mallampati: II       Dental  (+) Dental Advidsory Given, Chipped, Missing, Teeth Intact   Pulmonary neg shortness of breath, neg sleep apnea, neg COPD, neg recent URI, Current Smoker          Cardiovascular hypertension (not currently sing meds), (-) angina + DVT  (-) Past MI and (-) Cardiac Stents (-) dysrhythmias (-) Valvular Problems/Murmurs     Neuro/Psych neg Seizures PSYCHIATRIC DISORDERS Anxiety Depression       GI/Hepatic Neg liver ROS,neg GERD  ,,  Endo/Other  neg diabetes    Renal/GU negative Renal ROS     Musculoskeletal   Abdominal   Peds  Hematology   Anesthesia Other Findings Past Medical History: No date: Anxiety No date: Brachial neuritis No date: Cervical cancer (HCC) No date: Chronic neck pain No date: Chronic pain not due to malignancy No date: Depressive disorder     Comment:  followed by Dr. John Clapacs. No date: Disturbance, sleep No date: DVT (deep venous thrombosis) (HCC)     Comment:  left arm No date: Elevated blood pressure reading without diagnosis of  hypertension No date: Embolism and thrombosis of splenic artery No date: H/O thrombophlebitis No date: Infarction of spleen No date: Lumbosacral neuritis No date: Obesity, Class II, BMI 35-39.9, with comorbidity   Reproductive/Obstetrics                             Anesthesia Physical Anesthesia Plan  ASA: 2  Anesthesia Plan: General   Post-op Pain Management:    Induction: Intravenous, Rapid sequence and Cricoid pressure planned  PONV Risk Score and Plan: 1 and Ondansetron , Dexamethasone , Midazolam  and Treatment may  vary due to age or medical condition  Airway Management Planned: Oral ETT  Additional Equipment:   Intra-op Plan:   Post-operative Plan: Extubation in OR  Informed Consent: I have reviewed the patients History and Physical, chart, labs and discussed the procedure including the risks, benefits and alternatives for the proposed anesthesia with the patient or authorized representative who has indicated his/her understanding and acceptance.       Plan Discussed with:   Anesthesia Plan Comments:         Anesthesia Quick Evaluation

## 2024-04-19 NOTE — Discharge Instructions (Addendum)

## 2024-04-20 NOTE — Telephone Encounter (Signed)
 Per Dr. Dortha Gauss, pt had surgery done sooner because she presented to ER. He let her know that she might need to be on anticoagulant again and to contact Cancer center to set up appt with Dr. Wilhelmenia Harada.

## 2024-04-21 LAB — SURGICAL PATHOLOGY

## 2024-04-22 ENCOUNTER — Other Ambulatory Visit: Payer: Self-pay | Admitting: Psychiatry

## 2024-04-22 DIAGNOSIS — F331 Major depressive disorder, recurrent, moderate: Secondary | ICD-10-CM

## 2024-04-22 DIAGNOSIS — F411 Generalized anxiety disorder: Secondary | ICD-10-CM

## 2024-04-27 ENCOUNTER — Telehealth (INDEPENDENT_AMBULATORY_CARE_PROVIDER_SITE_OTHER): Payer: Self-pay | Admitting: Psychiatry

## 2024-04-27 ENCOUNTER — Encounter: Payer: Self-pay | Admitting: General Surgery

## 2024-04-27 DIAGNOSIS — F3341 Major depressive disorder, recurrent, in partial remission: Secondary | ICD-10-CM | POA: Diagnosis not present

## 2024-04-27 DIAGNOSIS — Z79899 Other long term (current) drug therapy: Secondary | ICD-10-CM

## 2024-04-27 DIAGNOSIS — F411 Generalized anxiety disorder: Secondary | ICD-10-CM | POA: Diagnosis not present

## 2024-04-27 DIAGNOSIS — G4701 Insomnia due to medical condition: Secondary | ICD-10-CM | POA: Diagnosis not present

## 2024-04-27 NOTE — Progress Notes (Signed)
 Virtual Visit via Video Note  I connected with Jamie [name] on 04/27/24 at  3:20 PM EDT by a video enabled telemedicine application and verified that I am speaking with the correct person using two identifiers.  Location Provider Location : [facility] Patient Location : Home  Participants: Patient , Spouse,Provider    I discussed the limitations of evaluation and management by telemedicine and the availability of in person appointments. The patient expressed understanding and agreed to proceed.   I discussed the assessment and treatment plan with the patient. The patient was provided an opportunity to ask questions and all were answered. The patient agreed with the plan and demonstrated an understanding of the instructions.   The patient was advised to call back or seek an in-person evaluation if the symptoms worsen or if the condition fails to improve as anticipated.   BH MD OP Progress Note  04/27/2024 3:38 PM Jamie [name]  MRN:  [id number]  Chief Complaint:  Chief Complaint  Patient presents with   Follow-up   Depression   Anxiety   Medication Refill   Discussed the use of AI scribe software for clinical note transcription with the patient, who gave verbal consent to proceed.  History of Present Illness Jamie [name] is a 58 year old Caucasian female, married, lives in [place], has a history of MDD, insomnia, chronic pain  presents for psychiatric follow-up.  She is recovering from an emergency laparoscopic cholecystectomy performed on April 19, 2024, due to abdominal and chest pain. She feels better day by day and plans to return to work after her follow-up with the surgeon on May 04, 2024. Her appetite remains limited to a bland diet post-surgery, with plans to resume a regular diet in two weeks.  Her depression was in partial remission as of March 2025, and she continues to participate in an employer-provided therapy program, with her last session in early April 2025.  She is taking Amitriptyline  125 mg daily and Rexulti  3 mg. She experiences occasional anxiety, particularly when at home, and had one episode of significant distress two weeks ago, which was managed with the help of her son. She uses hydroxyzine  as needed for anxiety, noting it can cause fatigue.  Denies current thoughts of self-harm or harm to others.  Her sleep is reportedly adequate, and she is not taking any additional medication for sleep beyond amitriptyline . She plans to return to her work schedule of 6 AM to 2:30 PM. She has support from her husband and a friend, who is available in the afternoons and evenings.  Denies panic attacks, mood swings, or crying spells.    Visit Diagnosis:    ICD-10-CM   1. MDD (major depressive disorder), recurrent, in partial remission (HCC)  F33.41 Hemoglobin A1C    Prolactin    2. Insomnia due to medical condition  G47.01    Multifactorial including pain    3. GAD (generalized anxiety disorder)  F41.1     4. High risk medication use  Z79.899 Hemoglobin A1C    Prolactin      Past Psychiatric History: I have reviewed past psychiatric history from progress note on 03/08/2019.  Past trials of medications like venlafaxine , zolpidem , Seroquel , propranolol , Prozac , Cymbalta , doxepin , Celexa , carbamazepine , Abilify .  Past Medical History:  Past Medical History:  Diagnosis Date   Anxiety    Brachial neuritis    Cervical cancer (HCC)    Chronic neck pain    Chronic pain not due to malignancy  Depressive disorder    followed by Dr. John Clapacs.   Disturbance, sleep    DVT (deep venous thrombosis) (HCC)    left arm   Elevated blood pressure reading without diagnosis of hypertension    Embolism and thrombosis of splenic artery    H/O thrombophlebitis    Infarction of spleen    Lumbosacral neuritis    Obesity, Class II, BMI 35-39.9, with comorbidity     Past Surgical History:  Procedure Laterality Date   CESAREAN SECTION  09/24/2010   IVC  FILTER INSERTION N/A 05/24/2021   Procedure: IVC FILTER INSERTION;  Surgeon: Celso College, MD;  Location: [facility] INVASIVE CV LAB;  Service: Cardiovascular;  Laterality: N/A;   IVC FILTER REMOVAL N/A 07/18/2021   Procedure: IVC FILTER REMOVAL;  Surgeon: Celso College, MD;  Location: [facility] INVASIVE CV LAB;  Service: Cardiovascular;  Laterality: N/A;   TONSILLECTOMY AND ADENOIDECTOMY  09/24/2010   TOTAL HIP ARTHROPLASTY Right 05/29/2021   Procedure: TOTAL HIP ARTHROPLASTY ANTERIOR APPROACH;  Surgeon: Molli Angelucci, MD;  Location: [facility] ORS;  Service: Orthopedics;  Laterality: Right;   TUBAL LIGATION  09/24/2010    Family Psychiatric History: I have reviewed family psychiatric history from progress note on 03/08/2019.  Family History:  Family History  Problem Relation Age of Onset   Anxiety disorder Mother    Depression Mother    Alcohol abuse Brother    Depression Daughter     Social History: I have reviewed social history from progress note on 03/08/2019. Social History   Socioeconomic History   Marital status: Married    Spouse name: rodney   Number of children: 3   Years of education: Not on file   Highest education level: Some college, no degree  Occupational History    Comment: full time  Tobacco Use   Smoking status: Never   Smokeless tobacco: Never  Vaping Use   Vaping status: Never Used  Substance and Sexual Activity   Alcohol use: Yes    Alcohol/week: 0.0 standard drinks of alcohol    Comment: social   Drug use: Not Currently    Types: Marijuana   Sexual activity: Yes    Partners: Male    Birth control/protection: Surgical    Comment: Tubal ligation   Other Topics Concern   Not on file  Social History Narrative   Not on file   Social Drivers of Health   Financial Resource Strain: Low Risk  (02/09/2024)   Received from [facility]   Overall Financial Resource Strain (CARDIA)    Difficulty of Paying Living Expenses: Not hard at all  Food Insecurity:  No Food Insecurity (02/09/2024)   Received from [facility]   Hunger Vital Sign    Worried About Running Out of Food in the Last Year: Never true    Ran Out of Food in the Last Year: Never true  Transportation Needs: No Transportation Needs (02/09/2024)   Received from [facility] - Transportation    In the past 12 months, has lack of transportation kept you from medical appointments or from getting medications?: No    Lack of Transportation (Non-Medical): No  Physical Activity: Inactive (02/03/2018)   Exercise Vital Sign    Days of Exercise per Week: 0 days    Minutes of Exercise per Session: 0 min  Stress: Stress Concern Present (02/03/2018)   [employer] of Occupational Health - Occupational Stress Questionnaire    Feeling  of Stress : Very much  Social Connections: Somewhat Isolated (02/03/2018)   Social Connection and Isolation Panel [NHANES]    Frequency of Communication with Friends and Family: More than three times a week    Frequency of Social Gatherings with Friends and Family: Never    Attends Religious Services: Never    Database administrator or Organizations: No    Attends Banker Meetings: Never    Marital Status: Married    Allergies:  Allergies  Allergen Reactions   Hydrocodone-Acetaminophen  Nausea Only   Neuromuscular Blocking Agents Other (See Comments)    Don't work    Metabolic Disorder Labs: Lab Results  Component Value Date   HGBA1C 5.6 10/31/2021   MPG 114.02 10/31/2021   Lab Results  Component Value Date   PROLACTIN 13.5 10/31/2021   No results found for: "CHOL", "TRIG", "HDL", "CHOLHDL", "VLDL", "LDLCALC" No results found for: "TSH"  Therapeutic Level Labs: No results found for: "LITHIUM" No results found for: "VALPROATE" Lab Results  Component Value Date   CBMZ 5.4 11/01/2022    Current Medications: Current Outpatient Medications  Medication Sig Dispense Refill   acetaminophen   (TYLENOL ) 325 MG tablet Take 2 tablets (650 mg total) by mouth every 6 (six) hours as needed for mild pain (pain score 1-3), headache or fever.     amitriptyline  (ELAVIL ) 100 MG tablet TAKE 1 TABLET BY MOUTH AT BEDTIME. TAKE ALONG WITH 25 MG DAILY 30 tablet 1   amitriptyline  (ELAVIL ) 25 MG tablet Take 1 tablet (25 mg total) by mouth at bedtime. Take along with 100 mg 30 tablet 1   Brexpiprazole  (REXULTI ) 3 MG TABS Take 1 tablet (3 mg total) by mouth daily with supper. 30 tablet 3   butalbital -acetaminophen -caffeine  (FIORICET ) 50-325-40 MG tablet Take 1 tablet by mouth every 6 (six) hours as needed (headache refractory to tylenol ). 14 tablet 0   hydrOXYzine  (VISTARIL ) 25 MG capsule TAKE 1 CAPSULE BY MOUTH THREE TIMES DAILY AS NEEDED FOR ANXIETY AND FOR SLEEP 90 capsule 3   naloxone (NARCAN) nasal spray 4 mg/0.1 mL as needed.     ondansetron  (ZOFRAN ) 4 MG tablet Take 1 tablet (4 mg total) by mouth every 6 (six) hours as needed for nausea. (Patient not taking: Reported on 12/12/2023) 20 tablet 0   oxyCODONE  (ROXICODONE ) 5 MG immediate release tablet Take 1 tablet (5 mg total) by mouth every 8 (eight) hours as needed. 20 tablet 0   polyethylene glycol (MIRALAX  / GLYCOLAX ) 17 g packet Take 17 g by mouth daily. 30 each 0   RIVAROXABAN  (XARELTO ) VTE STARTER PACK (15 & 20 MG) Follow package directions: Take one 15mg  tablet by mouth twice a day. On day 22, switch to one 20mg  tablet once a day. Take with food. 51 each 0   No current facility-administered medications for this visit.     Musculoskeletal: Strength & Muscle Tone:  UTA Gait & Station:  Seated Patient leans: N/A  Psychiatric Specialty Exam: Review of Systems  Psychiatric/Behavioral:  The patient is nervous/anxious.     Last menstrual period 02/18/2018.There is no height or weight on file to calculate BMI.  General Appearance: Casual  Eye Contact:  Fair  Speech:  Normal Rate  Volume:  Normal  Mood:  Anxious  Affect:  Appropriate   Thought Process:  Goal Directed and Descriptions of Associations: Intact  Orientation:  Full (Time, Place, and Person)  Thought Content: Logical   Suicidal Thoughts:  No  Homicidal Thoughts:  No  Memory:  Immediate;   Fair Recent;   Fair Remote;   Fair  Judgement:  Fair  Insight:  Fair  Psychomotor Activity:  Normal  Concentration:  Concentration: Fair and Attention Span: Fair  Recall:  [employer] of Knowledge: Fair  Language: Fair  Akathisia:  No  Handed:  Right  AIMS (if indicated): not done  Assets:  Desire for Improvement Housing Social Support Transportation  ADL's:  Intact  Cognition: WNL  Sleep:  Fair   Screenings: Geneticist, molecular Office Visit from 11/20/2022 in [facility] Video Visit from 10/14/2022 in [facility] Office Visit from 09/11/2022 in [facility] Video Visit from 05/13/2022 in [facility] Video Visit from 02/26/2022 in [facility]  AIMS Total Score 0 0 0 0 0      GAD-7    Flowsheet Row Office Visit from 10/22/2023 in [facility] Office Visit from 11/20/2022 in [facility] Office Visit from 09/11/2022 in [facility] Video Visit from 05/13/2022 in [facility] Video Visit from 02/26/2022 in [facility]  Total GAD-7 Score 14 4 9 3 6       PHQ2-9    Flowsheet Row Office Visit from 10/22/2023 in [facility] Counselor from 04/02/2023 in [facility] Visit from 11/20/2022 in [facility] Video Visit from 10/24/2022 in [facility] Video Visit from 10/14/2022 in [facility]  PHQ-2 Total Score 2 2 2 5 2   PHQ-9 Total Score 8 17 5 11 5       Flowsheet Row Video Visit from 04/27/2024 in [facility] ED to Hosp-Admission (Discharged) from 04/19/2024 in [facility] from 03/22/2024 in [facility]  C-SSRS RISK CATEGORY No Risk No Risk No Risk        Assessment and Plan:Jamie [name] is a 58 year old Caucasian female who has a history of MDD, chronic pain, pulmonary embolism was evaluated by telemedicine today.  Discussed assessment and plan as noted below.  Assessment & Plan Major depressive disorder, in partial remission Symptoms are improving with ongoing employer-provided therapy and medication. Last therapy session was in early April. She continues on amitriptyline  125 mg daily and Rexulti  3 mg. No active suicidal ideation, but experienced distress two weeks ago due to situational stress at home, managed with family support. She is aware of how to seek help if in a crisis. - Continue Amitriptyline  125 mg daily. - Continue Rexulti  3 mg daily. - Continue with employer-provided therapy program.  Generalized anxiety disorder-improving Occasional anxiety symptoms, particularly situational anxiety related to home environment. Uses hydroxyzine  as needed, which may cause fatigue. No panic attacks or mood swings reported. Work is anticipated to provide beneficial structure. - Use Hydroxyzine  25 mg 3 times a day as needed for anxiety, but limit use to avoid fatigue. - Continue with current medication regimen for anxiety management.  Insomnia-improving Currently surgical pain does have an impact on sleep although it is getting better. - Continue pain management. - Continue sleep hygiene techniques.  High risk medication  use-will order hemoglobin A1c, prolactin since patient is on Rexulti .  Patient to go to [facility] lab.  Post-cholecystectomy status Recovery is ongoing post laparoscopic cholecystectomy on April 19, 2024. Surgical pain managed with oxycodone  5 mg as needed. Following a bland diet as advised for two weeks post-surgery. Plans to return to work after follow-up with surgeon on May 04, 2024. - Continue oxycodone  5 mg as needed for pain management. - Follow up with surgeon on May 04, 2024. - Continue bland diet as advised for two weeks post-surgery.  Follow-up Follow-up in clinic in 2 months or sooner if needed.  Collaboration of Care: Collaboration of Care: Referral or follow-up with counselor/therapist AEB encouraged to continue psychotherapy sessions.  Patient/Guardian was advised Release of Information must be obtained prior to any record release in order to collaborate their care with an outside provider. Patient/Guardian was advised if they have not already done so to contact the registration department to sign all necessary forms in order for us  to release information regarding their care.   Consent: Patient/Guardian gives verbal consent for treatment and assignment of benefits for services provided during this visit. Patient/Guardian expressed understanding and agreed to proceed.   This note was generated in part or whole with voice recognition software. Voice recognition is usually quite accurate but there are transcription errors that can and very often do occur. I apologize for any typographical errors that were not detected and corrected.    Eniya Cannady, MD 04/29/2024, 8:16 AM

## 2024-05-25 ENCOUNTER — Other Ambulatory Visit: Payer: Self-pay | Admitting: Orthopedic Surgery

## 2024-05-25 DIAGNOSIS — S83242A Other tear of medial meniscus, current injury, left knee, initial encounter: Secondary | ICD-10-CM

## 2024-05-26 ENCOUNTER — Other Ambulatory Visit: Payer: Self-pay | Admitting: Psychiatry

## 2024-05-26 DIAGNOSIS — F3341 Major depressive disorder, recurrent, in partial remission: Secondary | ICD-10-CM

## 2024-05-29 ENCOUNTER — Ambulatory Visit: Admission: RE | Admit: 2024-05-29 | Source: Ambulatory Visit

## 2024-06-22 ENCOUNTER — Ambulatory Visit: Admission: RE | Admit: 2024-06-22 | Source: Ambulatory Visit

## 2024-06-25 ENCOUNTER — Telehealth (INDEPENDENT_AMBULATORY_CARE_PROVIDER_SITE_OTHER): Payer: Self-pay | Admitting: Psychiatry

## 2024-06-25 DIAGNOSIS — F3341 Major depressive disorder, recurrent, in partial remission: Secondary | ICD-10-CM

## 2024-06-25 NOTE — Progress Notes (Signed)
 No response to call or text or video invite

## 2024-06-28 ENCOUNTER — Other Ambulatory Visit: Payer: Self-pay | Admitting: Psychiatry

## 2024-06-28 DIAGNOSIS — F331 Major depressive disorder, recurrent, moderate: Secondary | ICD-10-CM

## 2024-06-28 DIAGNOSIS — F411 Generalized anxiety disorder: Secondary | ICD-10-CM

## 2024-07-05 ENCOUNTER — Other Ambulatory Visit: Payer: Self-pay | Admitting: Psychiatry

## 2024-07-05 DIAGNOSIS — F3341 Major depressive disorder, recurrent, in partial remission: Secondary | ICD-10-CM

## 2024-07-25 ENCOUNTER — Other Ambulatory Visit: Payer: Self-pay | Admitting: Psychiatry

## 2024-07-25 DIAGNOSIS — F3342 Major depressive disorder, recurrent, in full remission: Secondary | ICD-10-CM

## 2024-07-25 DIAGNOSIS — F411 Generalized anxiety disorder: Secondary | ICD-10-CM

## 2024-07-26 ENCOUNTER — Telehealth: Payer: Self-pay | Admitting: Psychiatry

## 2024-07-26 NOTE — Telephone Encounter (Signed)
 I do not see an appointment scheduled for this patient.

## 2024-07-31 ENCOUNTER — Other Ambulatory Visit: Payer: Self-pay | Admitting: Psychiatry

## 2024-07-31 DIAGNOSIS — F331 Major depressive disorder, recurrent, moderate: Secondary | ICD-10-CM

## 2024-07-31 DIAGNOSIS — F411 Generalized anxiety disorder: Secondary | ICD-10-CM

## 2024-08-01 NOTE — Telephone Encounter (Signed)
 Please contact to make a follow up with Dr. Eappen

## 2024-08-14 ENCOUNTER — Other Ambulatory Visit: Payer: Self-pay | Admitting: Psychiatry

## 2024-08-14 DIAGNOSIS — F3341 Major depressive disorder, recurrent, in partial remission: Secondary | ICD-10-CM

## 2024-08-27 ENCOUNTER — Other Ambulatory Visit: Payer: Self-pay | Admitting: Psychiatry

## 2024-08-27 DIAGNOSIS — F411 Generalized anxiety disorder: Secondary | ICD-10-CM

## 2024-08-27 DIAGNOSIS — F3342 Major depressive disorder, recurrent, in full remission: Secondary | ICD-10-CM

## 2024-08-30 ENCOUNTER — Other Ambulatory Visit: Payer: Self-pay | Admitting: Psychiatry

## 2024-08-30 DIAGNOSIS — F411 Generalized anxiety disorder: Secondary | ICD-10-CM

## 2024-08-30 DIAGNOSIS — F331 Major depressive disorder, recurrent, moderate: Secondary | ICD-10-CM

## 2024-09-03 ENCOUNTER — Encounter: Payer: Self-pay | Admitting: Psychiatry

## 2024-09-03 ENCOUNTER — Telehealth (INDEPENDENT_AMBULATORY_CARE_PROVIDER_SITE_OTHER): Admitting: Psychiatry

## 2024-09-03 DIAGNOSIS — F411 Generalized anxiety disorder: Secondary | ICD-10-CM | POA: Diagnosis not present

## 2024-09-03 DIAGNOSIS — F331 Major depressive disorder, recurrent, moderate: Secondary | ICD-10-CM | POA: Diagnosis not present

## 2024-09-03 DIAGNOSIS — Z9189 Other specified personal risk factors, not elsewhere classified: Secondary | ICD-10-CM | POA: Diagnosis not present

## 2024-09-03 DIAGNOSIS — G4701 Insomnia due to medical condition: Secondary | ICD-10-CM | POA: Diagnosis not present

## 2024-09-03 MED ORDER — REXULTI 4 MG PO TABS: 4.0000 mg | ORAL_TABLET | Freq: Every morning | ORAL | 1 refills | Status: DC

## 2024-09-03 NOTE — Progress Notes (Signed)
 Virtual Visit via Video Note  I connected with Jamie [name] on 09/03/24 at 10:30 AM EDT by a video enabled telemedicine application and verified that I am speaking with the correct person using two identifiers.  Location Provider Location : [facility] Patient Location : Home  Participants: Patient , Provider    I discussed the limitations of evaluation and management by telemedicine and the availability of in person appointments. The patient expressed understanding and agreed to proceed.   I discussed the assessment and treatment plan with the patient. The patient was provided an opportunity to ask questions and all were answered. The patient agreed with the plan and demonstrated an understanding of the instructions.   The patient was advised to call back or seek an in-person evaluation if the symptoms worsen or if the condition fails to improve as anticipated.  [facility] MD OP Progress Note  09/03/2024 1:55 PM Jamie [name]  MRN:  [id number]  Chief Complaint:  Chief Complaint  Patient presents with   Follow-up   Anxiety   Depression   Medication Refill   Discussed the use of AI scribe software for clinical note transcription with the patient, who gave verbal consent to proceed.  History of Present Illness Jamie [name] is a 58 year old Caucasian female, married, lives in [place], has a history of MDD, insomnia, chronic pain, was evaluated by telemedicine today.  Over the past two weeks, she has experienced increased difficulty with motivation and initiation in the mornings, describing significant struggle to get out of bed, shower, and get dressed. She explains that she overthinks these tasks and feels drained by the time she arrives at work, though she manages to function once there. She notes that these symptoms have worsened each day.  Regarding sleep, she maintains a consistent schedule of going to bed at 9:00 pm and waking at 4:15 am, with the aid of amitriptyline . She reports  falling asleep shortly after going to bed and estimates receiving about 7 hours of sleep nightly. She denies any issues with nighttime sleep but identifies mornings as particularly challenging.  Her current medications include amitriptyline , Rexulti , which she takes in the morning, and hydroxyzine  25 mg three times daily for anxiety. She reports that Rexulti  does not cause drowsiness or tiredness. She denies any recent changes to her medications or new medical problems.  She denies suicidal thoughts or thoughts of harming others. She describes her home environment as improved, with better communication and no ongoing nightly conflicts with her husband.  She has completed 9 out of 10 sessions of employee assistance counseling therapy, with 1 session remaining.  She is interested in medication adjustment today since she is worried her depression symptoms may get worse.  She is agreeable to dosage increase of Rexulti .  Denies any other concerns today.   Visit Diagnosis:    ICD-10-CM   1. MDD (major depressive disorder), recurrent episode, moderate (HCC)  F33.1 Brexpiprazole  (REXULTI ) 4 MG TABS    2. Insomnia due to medical condition  G47.01    Multifactorial including pain    3. GAD (generalized anxiety disorder)  F41.1     4. At risk for prolonged QT interval syndrome  Z91.89 EKG 12-Lead      Past Psychiatric History: I have reviewed past psychiatric history from progress note on 03/08/2019.  Past trials of medications like venlafaxine , zolpidem , Seroquel , propranolol , Prozac , Cymbalta , doxepin , Celexa , carbamazepine , Abilify .  Past Medical History:  Past Medical History:  Diagnosis Date   Anxiety  Brachial neuritis    Cervical cancer (HCC)    Chronic neck pain    Chronic pain not due to malignancy    Depressive disorder    followed by Dr. John Clapacs.   Disturbance, sleep    DVT (deep venous thrombosis) (HCC)    left arm   Elevated blood pressure reading without diagnosis of  hypertension    Embolism and thrombosis of splenic artery    H/O thrombophlebitis    Infarction of spleen    Lumbosacral neuritis    Obesity, Class II, BMI 35-39.9, with comorbidity     Past Surgical History:  Procedure Laterality Date   CESAREAN SECTION  09/24/2010   IVC FILTER INSERTION N/A 05/24/2021   Procedure: IVC FILTER INSERTION;  Surgeon: Marea Selinda RAMAN, MD;  Location: [facility]ASIVE [facility];  Service: Cardiovascular;  Laterality: N/A;   IVC FILTER REMOVAL N/A 07/18/2021   Procedure: IVC FILTER REMOVAL;  Surgeon: Marea Selinda RAMAN, MD;  Location: [facility];  Service: Cardiovascular;  Laterality: N/A;   TONSILLECTOMY AND ADENOIDECTOMY  09/24/2010   TOTAL HIP ARTHROPLASTY Right 05/29/2021   Procedure: TOTAL HIP ARTHROPLASTY ANTERIOR APPROACH;  Surgeon: Kathlynn Sharper, MD;  Location: [facility] ORS;  Service: Orthopedics;  Laterality: Right;   TUBAL LIGATION  09/24/2010    Family Psychiatric History: I have reviewed family psychiatric history from progress note on 03/08/2019.  Family History:  Family History  Problem Relation Age of Onset   Anxiety disorder Mother    Depression Mother    Alcohol abuse Brother    Depression Daughter     Social History: I have reviewed social history from progress note on 03/08/2019. Social History   Socioeconomic History   Marital status: Married    Spouse name: rodney   Number of children: 3   Years of education: Not on file   Highest education level: Some college, no degree  Occupational History    Comment: full time  Tobacco Use   Smoking status: Never   Smokeless tobacco: Never  Vaping Use   Vaping status: Never Used  Substance and Sexual Activity   Alcohol use: Yes    Alcohol/week: 0.0 standard drinks of alcohol    Comment: social   Drug use: Not Currently    Types: Marijuana   Sexual activity: Yes    Partners: Male    Birth control/protection: Surgical    Comment: Tubal ligation   Other Topics Concern   Not on file  Social  History Narrative   Not on file   Social Drivers of Health   Financial Resource Strain: Low Risk  (05/21/2024)   [facility]   Overall Financial Resource Strain (CARDIA)    Difficulty of Paying Living Expenses: Not hard at all  Food Insecurity: No Food Insecurity (05/21/2024)   Received from [facility]   Hunger Vital Sign    Within the past 12 months, you worried that your food would run out before you got the money to buy more.: Never true    Within the past 12 months, the food you bought just didn't last and you didn't have money to get more.: Never true  Transportation Needs: No Transportation Needs (05/21/2024)   Received from [facility] - Transportation    In the past 12 months, has lack of transportation kept you from medical appointments or from getting medications?: No    Lack of Transportation (Non-Medical): No  Physical  Activity: Inactive (02/03/2018)   Exercise Vital Sign    Days of Exercise per Week: 0 days    Minutes of Exercise per Session: 0 min  Stress: Stress Concern Present (02/03/2018)   [employer] of Occupational Health - Occupational Stress Questionnaire    Feeling of Stress : Very much  Social Connections: Somewhat Isolated (02/03/2018)   Social Connection and Isolation Panel    Frequency of Communication with Friends and Family: More than three times a week    Frequency of Social Gatherings with Friends and Family: Never    Attends Religious Services: Never    Database administrator or Organizations: No    Attends Banker Meetings: Never    Marital Status: Married    Allergies:  Allergies  Allergen Reactions   Hydrocodone-Acetaminophen  Nausea Only   Neuromuscular Blocking Agents Other (See Comments)    Don't work    Metabolic Disorder Labs: Lab Results  Component Value Date   HGBA1C 5.6 10/31/2021   MPG 114.02 10/31/2021   Lab Results  Component Value  Date   PROLACTIN 13.5 10/31/2021   No results found for: CHOL, TRIG, HDL, CHOLHDL, VLDL, LDLCALC No results found for: TSH  Therapeutic Level Labs: No results found for: LITHIUM No results found for: VALPROATE Lab Results  Component Value Date   CBMZ 5.4 11/01/2022    Current Medications: Current Outpatient Medications  Medication Sig Dispense Refill   Brexpiprazole  (REXULTI ) 4 MG TABS Take 1 tablet (4 mg total) by mouth in the morning. 30 tablet 1   acetaminophen  (TYLENOL ) 325 MG tablet Take 2 tablets (650 mg total) by mouth every 6 (six) hours as needed for mild pain (pain score 1-3), headache or fever.     amitriptyline  (ELAVIL ) 100 MG tablet TAKE 1 TABLET BY MOUTH AT BEDTIME .TAKE  ALONG  WITH  25  MG  DAILY.  NO  REFILLS  WITHOUT  APPOINTMENT 30 tablet 0   butalbital -acetaminophen -caffeine  (FIORICET ) 50-325-40 MG tablet Take 1 tablet by mouth every 6 (six) hours as needed (headache refractory to tylenol ). 14 tablet 0   hydrOXYzine  (VISTARIL ) 25 MG capsule TAKE 1 CAPSULE BY MOUTH THREE TIMES DAILY AS NEEDED FOR ANXIETY AND FOR SLEEP 90 capsule 3   naloxone (NARCAN) nasal spray 4 mg/0.1 mL as needed.     ondansetron  (ZOFRAN ) 4 MG tablet Take 1 tablet (4 mg total) by mouth every 6 (six) hours as needed for nausea. (Patient not taking: Reported on 12/12/2023) 20 tablet 0   oxyCODONE  (ROXICODONE ) 5 MG immediate release tablet Take 1 tablet (5 mg total) by mouth every 8 (eight) hours as needed. 20 tablet 0   polyethylene glycol (MIRALAX  / GLYCOLAX ) 17 g packet Take 17 g by mouth daily. 30 each 0   RIVAROXABAN  (XARELTO ) VTE STARTER PACK (15 & 20 MG) Follow package directions: Take one 15mg  tablet by mouth twice a day. On day 22, switch to one 20mg  tablet once a day. Take with food. 51 each 0   No current facility-administered medications for this visit.     Musculoskeletal: Strength & Muscle Tone: UTA Gait & Station: Seated Patient leans: N/A  Psychiatric  Specialty Exam: Review of Systems  Psychiatric/Behavioral:  Positive for dysphoric mood.     Last menstrual period 02/18/2018.There is no height or weight on file to calculate BMI.  General Appearance: Casual  Eye Contact:  Fair  Speech:  Clear and Coherent  Volume:  Normal  Mood:  Depressed  Affect:  Appropriate  Thought Process:  Goal Directed and Descriptions of Associations: Intact  Orientation:  Full (Time, Place, and Person)  Thought Content: Logical   Suicidal Thoughts:  No  Homicidal Thoughts:  No  Memory:  Immediate;   Fair Recent;   Fair Remote;   Fair  Judgement:  Fair  Insight:  Fair  Psychomotor Activity:  Normal  Concentration:  Concentration: Fair and Attention Span: Fair  Recall:  [employer] of Knowledge: Fair  Language: Fair  Akathisia:  No  Handed:  Right  AIMS (if indicated): not done  Assets:  Communication Skills Desire for Improvement Housing Social Support Transportation  ADL's:  Intact  Cognition: WNL  Sleep:  Fair   Screenings: Geneticist, molecular Office Visit from 11/20/2022 in [facility] Video Visit from 10/14/2022 in [facility] Office Visit from 09/11/2022 in [facility] Video Visit from 05/13/2022 in [facility] Video Visit from 02/26/2022 in [facility]  AIMS Total Score 0 0 0 0 0   GAD-7    Flowsheet Row Office Visit from 10/22/2023 in [facility] Office Visit from 11/20/2022 in [facility] Office Visit from 09/11/2022 in [facility] Video Visit from 05/13/2022 in [facility] Video Visit from 02/26/2022 in [facility]  Total GAD-7 Score 14  4 9 3 6    PHQ2-9    Flowsheet Row Office Visit from 10/22/2023 in [facility] Counselor from 04/02/2023 in [facility] Visit from 11/20/2022 in [facility] Video Visit from 10/24/2022 in [facility] Video Visit from 10/14/2022 in [facility]  PHQ-2 Total Score 2 2 2 5 2   PHQ-9 Total Score 8 17 5 11 5    Flowsheet Row Video Visit from 09/03/2024 in [facility] Video Visit from 04/27/2024 in [facility] ED to Hosp-Admission (Discharged) from 04/19/2024 in [facility]  C-SSRS RISK CATEGORY No Risk No Risk No Risk     Assessment and Plan: Jamie [name] is a 58 year old Caucasian female who has a history of MDD, chronic pain, pulmonary embolism was evaluated by telemedicine today.  Discussed assessment and plan as noted below.  MDD-unstable Recent worsening of depression symptoms in the morning although reports sleep has been good.  Struggles with lack of motivation especially earlier part of the day.  Interested in dosage change of Rexulti . Increase Rexulti  to 4 mg daily Reduce Amitriptyline  to 100 mg daily at bedtime. Continue psychotherapy sessions provided by employer assistance program.  Generalized anxiety disorder-stable Currently denies any significant anxiety attacks or worrying. Continue Hydroxyzine  25 mg 3 times a day as needed for anxiety Continue CBT  Insomnia-improving Currently reports overall sleep is good and she has a good sleep hygiene. Continue Amitriptyline  as prescribed Continue sleep hygiene techniques  At risk for prolonged QT syndrome-will order EKG.  Patient to get this completed in a week after starting the higher dosage.Patient to call  [phone] to get this done.  Pending labs, hemoglobin A1c, prolactin.  Patient to get this completed.  Follow-up Follow-up in clinic in 3 to 4 weeks or sooner if needed.   Collaboration of  Care: Collaboration of Care: Referral or follow-up with counselor/therapist AEB encouraged to continue psychotherapy sessions.  Patient/Guardian was advised Release of Information must be obtained prior to any record release in order to collaborate their care with an outside provider. Patient/Guardian was advised if they have not already done so to contact the registration department to sign all necessary forms in order for us  to release information regarding their care.   Consent: Patient/Guardian gives verbal consent for treatment and assignment of benefits for services provided during this visit. Patient/Guardian expressed understanding and agreed to proceed.   This note was generated in part or whole with voice recognition software. Voice recognition is usually quite accurate but there are transcription errors that can and very often do occur. I apologize for any typographical errors that were not detected and corrected.    Kenzleigh Sedam, MD 09/03/2024, 1:55 PM

## 2024-09-03 NOTE — Patient Instructions (Signed)
 Please call for [phone]

## 2024-09-13 ENCOUNTER — Ambulatory Visit
Admission: RE | Admit: 2024-09-13 | Discharge: 2024-09-13 | Disposition: A | Discharge status: Home / Self Care | Source: Ambulatory Visit | Attending: Orthopedic Surgery | Admitting: Orthopedic Surgery

## 2024-09-13 DIAGNOSIS — S83242A Other tear of medial meniscus, current injury, left knee, initial encounter: Secondary | ICD-10-CM | POA: Insufficient documentation

## 2024-09-14 ENCOUNTER — Telehealth: Payer: Self-pay

## 2024-09-14 NOTE — Telephone Encounter (Signed)
 Medication management - Attempted to reach patient after she left a message requesting a call back from Dr. Coby stating she was currently feeling more tired, exhausted, and crying more. Left pt a message to call our office back to discuss her symptoms and informed this nurse would send a message to Dr. Coby with her original request for a call back from provider to discuss.

## 2024-09-14 NOTE — Telephone Encounter (Signed)
 Attempted to contact patient to discuss her concerns.  Had to leave a voicemail.

## 2024-09-26 ENCOUNTER — Other Ambulatory Visit: Payer: Self-pay | Admitting: Psychiatry

## 2024-09-26 DIAGNOSIS — F411 Generalized anxiety disorder: Secondary | ICD-10-CM

## 2024-09-26 DIAGNOSIS — F3342 Major depressive disorder, recurrent, in full remission: Secondary | ICD-10-CM

## 2024-09-27 ENCOUNTER — Other Ambulatory Visit: Payer: Self-pay | Admitting: Psychiatry

## 2024-09-27 DIAGNOSIS — F411 Generalized anxiety disorder: Secondary | ICD-10-CM

## 2024-09-27 DIAGNOSIS — F331 Major depressive disorder, recurrent, moderate: Secondary | ICD-10-CM

## 2024-09-29 ENCOUNTER — Ambulatory Visit: Admitting: Psychiatry

## 2024-10-06 ENCOUNTER — Telehealth: Admitting: Psychiatry

## 2024-10-06 ENCOUNTER — Encounter: Payer: Self-pay | Admitting: Psychiatry

## 2024-10-06 DIAGNOSIS — F411 Generalized anxiety disorder: Secondary | ICD-10-CM

## 2024-10-06 DIAGNOSIS — F3341 Major depressive disorder, recurrent, in partial remission: Secondary | ICD-10-CM | POA: Diagnosis not present

## 2024-10-06 DIAGNOSIS — G4701 Insomnia due to medical condition: Secondary | ICD-10-CM

## 2024-10-06 DIAGNOSIS — R52 Pain, unspecified: Secondary | ICD-10-CM

## 2024-10-06 NOTE — Progress Notes (Unsigned)
 Virtual Visit via Video Note  I connected with Jamie [name] on 10/06/24 at  1:00 PM EDT by a video enabled telemedicine application and verified that I am speaking with the correct person using two identifiers.  Location Provider Location : [facility] Patient Location : Work  Participants: Patient , Provider   I discussed the limitations of evaluation and management by telemedicine and the availability of in person appointments. The patient expressed understanding and agreed to proceed.   I discussed the assessment and treatment plan with the patient. The patient was provided an opportunity to ask questions and all were answered. The patient agreed with the plan and demonstrated an understanding of the instructions.   The patient was advised to call back or seek an in-person evaluation if the symptoms worsen or if the condition fails to improve as anticipated.   BH MD OP Progress Note  10/06/2024 1:24 PM Jamie [name]  MRN:  [id number]  Chief Complaint:  Chief Complaint  Patient presents with   Follow-up   Anxiety   Medication Refill   Discussed the use of AI scribe software for clinical note transcription with the patient, who gave verbal consent to proceed.  History of Present Illness Jamie [name] is a 58 year old Caucasian female, married, lives in [place], has a history of MDD, insomnia, chronic pain was evaluated by telemedicine today.  She reports some improvement in her depressive symptoms since increasing her Rexulti  to 4 mg, describing that previously she lacked initiative, had no interest in activities, struggled to go to work or shower, and experienced increased tearfulness in response to both sad and happy events. She reports that these symptoms have calmed down somewhat, though she continues to struggle and recently missed 2 days of work due to her mood. She states she is making efforts to manage her symptoms and maintain her functioning.  Ongoing challenges with  work performance persist, as she describes struggling to keep up with assignments. She reports that her manager remains aware of her situation and has provided flexibility with project deadlines. She reports that her manager is supportive and wants to help her succeed at work.  She reports achieving adequate sleep, particularly when her pain is under control. She currently takes Rexulti  4 mg, amitriptyline  100 mg, and hydroxyzine  as needed. She denies any thoughts of hurting herself or others.  She describes improvement in her home environment, noting that her marriage has gotten better, with increased closeness and reduced arguing, which she reports has also benefited her children. She reports that the family is working together to improve their relationships.  She reports completing 10 sessions of therapy through her workplace and plans to contact the provider to arrange additional sessions, as she found therapy beneficial.  She has arthritis and Baker's cyst of her knee and is currently under the care of her providers for management of the same.   Visit Diagnosis:    ICD-10-CM   1. Recurrent major depressive disorder, in partial remission  F33.41    Moderate    2. Insomnia due to medical condition  G47.01    Multifactorial including pain    3. GAD (generalized anxiety disorder)  F41.1       Past Psychiatric History: I have reviewed past psychiatric history from progress note on 03/08/2019.  Past trials of medications like venlafaxine , zolpidem , Seroquel , propranolol , Prozac , Cymbalta , doxepin , Celexa , carbamazepine , Abilify .  Past Medical History:  Past Medical History:  Diagnosis Date   Anxiety    Brachial  neuritis    Cervical cancer (HCC)    Chronic neck pain    Chronic pain not due to malignancy    Depressive disorder    followed by Dr. John Clapacs.   Disturbance, sleep    DVT (deep venous thrombosis) (HCC)    left arm   Elevated blood pressure reading without diagnosis of  hypertension    Embolism and thrombosis of splenic artery    H/O thrombophlebitis    Infarction of spleen    Lumbosacral neuritis    Obesity, Class II, BMI 35-39.9, with comorbidity     Past Surgical History:  Procedure Laterality Date   CESAREAN SECTION  09/24/2010   IVC FILTER INSERTION N/A 05/24/2021   Procedure: IVC FILTER INSERTION;  Surgeon: Marea Selinda RAMAN, MD;  Location: [facility];  Service: Cardiovascular;  Laterality: N/A;   IVC FILTER REMOVAL N/A 07/18/2021   Procedure: IVC FILTER REMOVAL;  Surgeon: Marea Selinda RAMAN, MD;  Location: [facility];  Service: Cardiovascular;  Laterality: N/A;   TONSILLECTOMY AND ADENOIDECTOMY  09/24/2010   TOTAL HIP ARTHROPLASTY Right 05/29/2021   Procedure: TOTAL HIP ARTHROPLASTY ANTERIOR APPROACH;  Surgeon: Kathlynn Sharper, MD;  Location: [facility] OR[facility];  Service: Orthopedics;  Laterality: Right;   TUBAL LIGATION  09/24/2010    Family Psychiatric History: Reviewed family psychiatric history from progress note on 03/08/2019.  Family History:  Family History  Problem Relation Age of Onset   Anxiety disorder Mother    Depression Mother    Alcohol abuse Brother    Depression Daughter     Social History: I have reviewed social history from progress note on 03/08/2019. Social History   Socioeconomic History   Marital status: Married    Spouse name: rodney   Number of children: 3   Years of education: Not on file   Highest education level: Some college, no degree  Occupational History    Comment: full time  Tobacco Use   Smoking status: Never   Smokeless tobacco: Never  Vaping Use   Vaping status: Never Used  Substance and Sexual Activity   Alcohol use: Yes    Alcohol/week: 0.0 standard drinks of alcohol    Comment: social   Drug use: Not Currently    Types: Marijuana   Sexual activity: Yes    Partners: Male    Birth control/protection: Surgical    Comment: Tubal ligation   Other Topics Concern   Not on file  Social History  Narrative   Not on file   Social Drivers of Health   Financial Resource Strain: Low Risk  (09/29/2024)   Received from [facility]   Overall Financial Resource Strain (CARDIA)    Difficulty of Paying Living Expenses: Not hard at all  Food Insecurity: Food Insecurity Present (09/29/2024)   [facility]   Hunger Vital Sign    Within the past 12 months, you worried that your food would run out before you got the money to buy more.: Never true    Within the past 12 months, the food you bought just didn't last and you didn't have money to get more.: Sometimes true  Transportation Needs: No Transportation Needs (09/29/2024)   Received from [facility] - Transportation    In the past 12 months, has lack of transportation kept you from medical appointments or from getting medications?: No    Lack of Transportation (Non-Medical): No  Physical Activity: Inactive (02/03/2018)  Exercise Vital Sign    Days of Exercise per Week: 0 days    Minutes of Exercise per Session: 0 min  Stress: Stress Concern Present (02/03/2018)   [employer] of Occupational [employer] - Occupational Stress Questionnaire    Feeling of Stress : Very much  Social Connections: Somewhat Isolated (02/03/2018)   Social Connection and Isolation Panel    Frequency of Communication with Friends and Family: More than three times a week    Frequency of Social Gatherings with Friends and Family: Never    Attends Religious Services: Never    Database administrator or Organizations: No    Attends Banker Meetings: Never    Marital Status: Married    Allergies:  Allergies  Allergen Reactions   Hydrocodone-Acetaminophen  Nausea Only   Neuromuscular Blocking Agents Other (See Comments)    Don't work    Metabolic Disorder Labs: Lab Results  Component Value Date   HGBA1C 5.6 10/31/2021   MPG 114.02 10/31/2021   Lab Results  Component Value  Date   PROLACTIN 13.5 10/31/2021   No results found for: CHOL, TRIG, HDL, CHOLHDL, VLDL, LDLCALC No results found for: TSH  Therapeutic Level Labs: No results found for: LITHIUM No results found for: VALPROATE Lab Results  Component Value Date   CBMZ 5.4 11/01/2022    Current Medications: Current Outpatient Medications  Medication Sig Dispense Refill   celecoxib (CELEBREX) 100 MG capsule Take 100 mg by mouth.     acetaminophen  (TYLENOL ) 325 MG tablet Take 2 tablets (650 mg total) by mouth every 6 (six) hours as needed for mild pain (pain score 1-3), headache or fever.     amitriptyline  (ELAVIL ) 100 MG tablet Take 1 tablet (100 mg total) by mouth at bedtime. 30 tablet 3   Brexpiprazole  (REXULTI ) 4 MG TABS Take 1 tablet (4 mg total) by mouth in the morning. 30 tablet 1   butalbital -acetaminophen -caffeine  (FIORICET ) 50-325-40 MG tablet Take 1 tablet by mouth every 6 (six) hours as needed (headache refractory to tylenol ). 14 tablet 0   hydrOXYzine  (VISTARIL ) 25 MG capsule TAKE 1 CAPSULE BY MOUTH THREE TIMES DAILY AS NEEDED FOR ANXIETY AND FOR SLEEP 90 capsule 3   naloxone (NARCAN) nasal spray 4 mg/0.1 mL as needed.     ondansetron  (ZOFRAN ) 4 MG tablet Take 1 tablet (4 mg total) by mouth every 6 (six) hours as needed for nausea. (Patient not taking: Reported on 12/12/2023) 20 tablet 0   oxyCODONE  (ROXICODONE ) 5 MG immediate release tablet Take 1 tablet (5 mg total) by mouth every 8 (eight) hours as needed. 20 tablet 0   polyethylene glycol (MIRALAX  / GLYCOLAX ) 17 g packet Take 17 g by mouth daily. 30 each 0   RIVAROXABAN  (XARELTO ) VTE STARTER PACK (15 & 20 MG) Follow package directions: Take one 15mg  tablet by mouth twice a day. On day 22, switch to one 20mg  tablet once a day. Take with food. 51 each 0   No current facility-administered medications for this visit.     Musculoskeletal: Strength & Muscle Tone: UTA Gait & Station: Seated Patient leans: N/A  Psychiatric  Specialty Exam: Review of Systems  Psychiatric/Behavioral:  Positive for sleep disturbance. The patient is nervous/anxious.     Last menstrual period 02/18/2018.There is no height or weight on file to calculate BMI.  General Appearance: Fairly Groomed  Eye Contact:  Fair  Speech:  Clear and Coherent  Volume:  Normal  Mood:  Anxious  Affect:  Appropriate  Thought  Process:  Goal Directed and Descriptions of Associations: Intact  Orientation:  Full (Time, Place, and Person)  Thought Content: Logical   Suicidal Thoughts:  No  Homicidal Thoughts:  No  Memory:  Immediate;   Fair Recent;   Fair Remote;   Fair  Judgement:  Fair  Insight:  Fair  Psychomotor Activity:  Normal  Concentration:  Concentration: Fair and Attention Span: Fair  Recall:  [employer] of Knowledge: Fair  Language: Fair  Akathisia:  No  Handed:  Right  AIMS (if indicated): not done  Assets:  Communication Skills Desire for Improvement Housing Social Support  ADL's:  Intact  Cognition: WNL  Sleep:  Varies, pain does affect it   Screenings: Geneticist, molecular Office Visit from 11/20/2022 in [facility] Video Visit from 10/14/2022 in [facility] Office Visit from 09/11/2022 in [facility] Video Visit from 05/13/2022 in [facility] Video Visit from 02/26/2022 in [facility]  AIMS Total Score 0 0 0 0 0   GAD-7    Flowsheet Row Office Visit from 10/22/2023 in [facility] Office Visit from 11/20/2022 in [facility] Office Visit from 09/11/2022 in [facility] Video Visit from 05/13/2022 in [facility] Video Visit from 02/26/2022 in [facility]  Total GAD-7 Score 14 4 9 3 6    PHQ2-9    Flowsheet Row Office Visit from 10/22/2023 in [facility] Counselor from 04/02/2023 in [facility] Visit from 11/20/2022 in [facility] Video Visit from 10/24/2022 in [facility] Video Visit from 10/14/2022 in [facility]  PHQ-2 Total Score 2 2 2 5 2   PHQ-9 Total Score 8 17 5 11 5    Flowsheet Row Video Visit from 09/03/2024 in [facility] Video Visit from 04/27/2024 in [facility] ED to Hosp-Admission (Discharged) from 04/19/2024 in [facility]  C-SSRS RISK CATEGORY No Risk No Risk No Risk     Assessment and Plan: Jamie [name] is a 58 year old Caucasian female who has a history of depression, anxiety, insomnia was evaluated by telemedicine today.  Discussed assessment and plan as noted below.  1. Recurrent major depressive disorder, in partial remission Currently reports overall mood symptoms as improving on the higher dosage of Rexulti .  She does have comorbid pain which does affect her mood symptoms although she is coping better.  Motivated to stay in psychotherapy sessions. Continue Rexulti  4 mg daily Continue reduced dosage of Amitriptyline  at 100 mg at bedtime Continue psychotherapy sessions with employee assistance program  2. Insomnia due to medical condition-improving Sleep problems due to pain. Continue Hydroxyzine  25 mg 3 times a day as needed for anxiety and sleep Continue Amitriptyline  which also helps with sleep. Continue sleep hygiene techniques and sufficient pain management  3. GAD (generalized anxiety disorder)-improving Currently reports anxiety symptoms is  improving Continue psychotherapy sessions. Continue Amitriptyline  and Hydroxyzine  as prescribed.  Follow-up Follow-up in clinic in 8 weeks or sooner in person.  Collaboration of Care: Collaboration of Care: Referral or follow-up with counselor/therapist AEB Encouraged to continue psychotherapy sessions.  Patient/Guardian was advised Release of  Information must be obtained prior to any record release in order to collaborate their care with an outside provider. Patient/Guardian was advised if they have not already done so to contact the registration department to sign all necessary forms in order for us  to release information regarding their care.   Consent: Patient/Guardian gives verbal consent for treatment and assignment of benefits for services provided during this visit. Patient/Guardian expressed understanding and agreed to proceed.   This note was generated in part or whole with voice recognition software. Voice recognition is usually quite accurate but there are transcription errors that can and very often do occur. I apologize for any typographical errors that were not detected and corrected.    Dariel Betzer, MD 10/06/2024, 1:24 PM

## 2024-11-04 ENCOUNTER — Other Ambulatory Visit: Payer: Self-pay | Admitting: Psychiatry

## 2024-11-04 DIAGNOSIS — F331 Major depressive disorder, recurrent, moderate: Secondary | ICD-10-CM

## 2024-12-09 ENCOUNTER — Ambulatory Visit (INDEPENDENT_AMBULATORY_CARE_PROVIDER_SITE_OTHER): Admitting: Psychiatry

## 2024-12-09 ENCOUNTER — Encounter: Payer: Self-pay | Admitting: Psychiatry

## 2024-12-09 ENCOUNTER — Other Ambulatory Visit
Admission: RE | Admit: 2024-12-09 | Discharge: 2024-12-09 | Disposition: A | Discharge status: Home / Self Care | Attending: Psychiatry | Admitting: Psychiatry

## 2024-12-09 VITALS — BP 126/85 | HR 86 | Temp 97.7°F | Ht 62.0 in | Wt 174.6 lb

## 2024-12-09 DIAGNOSIS — Z79899 Other long term (current) drug therapy: Secondary | ICD-10-CM | POA: Diagnosis present

## 2024-12-09 DIAGNOSIS — F332 Major depressive disorder, recurrent severe without psychotic features: Secondary | ICD-10-CM | POA: Diagnosis not present

## 2024-12-09 DIAGNOSIS — G4701 Insomnia due to medical condition: Secondary | ICD-10-CM | POA: Diagnosis not present

## 2024-12-09 DIAGNOSIS — F411 Generalized anxiety disorder: Secondary | ICD-10-CM

## 2024-12-09 DIAGNOSIS — F3341 Major depressive disorder, recurrent, in partial remission: Secondary | ICD-10-CM | POA: Insufficient documentation

## 2024-12-09 LAB — HEMOGLOBIN A1C
Hgb A1c MFr Bld: 5.1 % (ref 4.8–5.6)
Mean Plasma Glucose: 99.67 mg/dL

## 2024-12-09 NOTE — Patient Instructions (Addendum)
 [phone]  [facility] for spravato nasal spray.    Esketamine Nasal Spray What is this medication? ESKETAMINE (es KET a meen) treats depression. It is prescribed when other antidepressant medications have not worked. This medicine may be used for other purposes; ask your health care provider or pharmacist if you have questions. COMMON BRAND NAME(S): Spravato What should I tell my care team before I take this medication? They need to know if you have any of these conditions: Blood vessel disease (including in the brain, chest, arms, legs, or other areas of the body) Dementia Heart disease High blood pressure History of bleeding in the brain History of head injury History of heart attack History of irregular heartbeat History of stroke History of substance use disorder Liver disease Schizophrenia or other thought disorder Suicidal thoughts, plans, or attempt by you or a family member An unusual or allergic reaction to esketamine, ketamine , other medications, foods, dyes, or preservatives Pregnant or trying to get pregnant Breastfeeding How should I use this medication? This medication is for use in the nose. You will take it yourself under the supervision of your care team in a healthcare setting. Your care team will show you how to use the nasal spray device, how much to take, and when to take it. During and after each treatment, you will be checked by your care team who will decide when you can go home. You will need to plan for a caregiver or family member to drive you home each time after taking the medication. Do not drive or perform other tasks that require complete mental alertness until the day following your treatment after a restful sleep. Because this medication can cause nausea and vomiting, you should not eat for at least 2 hours before taking this medication or drink liquids for at least 30 minutes before taking this medication. If you take a nasal corticosteroid or nasal  decongestant, take these medications at least 1 hour before your treatment. Do not stop your treatment except on your care team's advice. A special MedGuide will be given to you by the pharmacist with each prescription. Be sure to read this information carefully each time. Talk to your care team about the use of this medication in children. This medication is not approved for use in children. Overdosage: If you think you have taken too much of this medicine contact a poison control center or emergency room at once. NOTE: This medicine is only for you. Do not share this medicine with others. What if I miss a dose? Keep appointments for follow-up doses. It is important not to miss your dose. Call your care team if you are unable to keep an appointment. What may interact with this medication? Alcohol Benzodiazepines, such as alprazolam, diazepam, lorazepam Certain antihistamines Certain medications for anxiety or sleep Certain medications for depression, such as amitriptyline  or trazodone Certain medications for seizures, such as phenobarbital or primidone Ketamine  MAOIs, such as Marplan, Nardil, and Parnate Medications that cause drowsiness before a procedure, such as propofol  Medications that help you fall asleep Medications that relax muscles Opioids for pain or cough Phenothiazines, such as chlorpromazine, prochlorperazine , thioridazine Stimulant medications for ADHD, weight loss, or staying awake This list may not describe all possible interactions. Give your health care provider a list of all the medicines, herbs, non-prescription drugs, or dietary supplements you use. Also tell them if you smoke, drink alcohol, or use illegal drugs. Some items may interact with your medicine. What should I watch for while using  this medication? Your condition will be monitored carefully while you are receiving this medication. Tell your care team if your symptoms do not start to get better or if they get  worse. Your care team will monitor you for serious side effects for at least 2 hours after taking this medication. Tell your care team right away if you feel like you cannot stay awake or if you feel like you are going to pass out. This medication may affect your coordination, reaction time, or judgment. Do not drive or operate machinery until you know how this medication affects you. Sit up or stand slowly to reduce the risk of dizzy or fainting spells. Drinking alcohol with this medication can increase the risk of these side effects. This medication has a risk of abuse and dependence. Your care team will check you for this while you take this medication. Talk to your care team if you may be pregnant. Serious birth defects can occur if you take this medication during pregnancy. Do not breastfeed while taking this medication. What side effects may I notice from receiving this medication? Side effects that you should report to your care team as soon as possible: Allergic reactions--skin rash, itching, hives, swelling of the face, lips, tongue, or throat CNS depression--slow or shallow breathing, shortness of breath, feeling faint, dizziness, confusion, trouble staying awake Feeling disconnected from your thoughts, feelings, and surroundings Increase in blood pressure Thoughts of suicide or self-harm, worsening mood, feelings of depression Side effects that usually do not require medical attention (report these to your care team if they continue or are bothersome): Anxiety, nervousness Dizziness Drowsiness Extreme feeling of happiness or joy, intense excitement Nausea Numbness, decrease in sense of touch or sensation Vomiting This list may not describe all possible side effects. Call your doctor for medical advice about side effects. You may report side effects to FDA at 1-800-FDA-1088. Where should I keep my medication? This medication is given in a hospital or clinic and will not be stored at  home. NOTE: This sheet is a summary. It may not cover all possible information. If you have questions about this medicine, talk to your doctor, pharmacist, or health care provider.  2024 Elsevier/Gold Standard (2023-11-28 00:00:00)  Lithium Capsules or Tablets What is this medication? LITHIUM (LITH ee um) treats bipolar disorder. It works by balancing substances in your brain that help regulate mood, behaviors, and thoughts. This medicine may be used for other purposes; ask your health care provider or pharmacist if you have questions. COMMON BRAND NAME(S): Eskalith What should I tell my care team before I take this medication? They need to know if you have any of these conditions: Active infection Breathing problems Brugada Syndrome Dehydration (diarrhea or sweating) Diet low in salt Heart disease High levels of calcium in the blood History of irregular heartbeat Kidney disease Low level of potassium or sodium in the blood Parathyroid disease Problems urinating Thyroid disease An unusual or allergic reaction to lithium, other medications, foods, dyes, or preservatives Pregnant or trying to get pregnant Breast-feeding How should I use this medication? Take this medication by mouth with water. Take it as directed on the prescription label at the same time every day. You can take it with or without food. If it upsets your stomach, take it with food. Keep taking it unless your care team tells you to stop. A special MedGuide will be given to you by the pharmacist with each prescription and refill. Be sure to read this information carefully  each time. Talk to your care team about the use of this medication in children. While it may be prescribed for children as young as 7 years for selected conditions, precautions do apply. Overdosage: If you think you have taken too much of this medicine contact a poison control center or emergency room at once. NOTE: This medicine is only for you. Do  not share this medicine with others. What if I miss a dose? If you miss a dose, take it as soon as you can. If it is almost time for your next dose, take only that dose. Do not take double or extra doses. What may interact with this medication? Do not take this medication with any of the following: Cisapride Dronedarone Pimozide Thioridazine This medication may also interact with the following: Caffeine  Carbamazepine  Certain medications for depression, anxiety, or other mental health conditions Certain medications for high blood pressure Certain medications for migraine headache, such as almotriptan, eletriptan, frovatriptan, naratriptan, rizatriptan, sumatriptan, zolmitriptan Diuretics Fentanyl  Linezolid MAOIs, such as Carbex, Eldepryl, Marplan, Nardil, and Parnate Medications that relax muscles for surgery Methyldopa Metronidazole NSAIDs, medications for pain and inflammation, such as ibuprofen or naproxen Other medications that cause heart rhythm changes, such as dofetilide Phenytoin Potassium iodide SGLT2 inhibitors, such as canagliflozin, dapagliflozin, empagliflozin, and ertugliflozin Sodium bicarbonate Sodium chloride  St. John's Wort Theophylline Tramadol  Tryptophan Urea This list may not describe all possible interactions. Give your health care provider a list of all the medicines, herbs, non-prescription drugs, or dietary supplements you use. Also tell them if you smoke, drink alcohol, or use illegal drugs. Some items may interact with your medicine. What should I watch for while using this medication? Visit your care team for regular checks on your progress. It can take several weeks of treatment before you start to get better. The amount of salt (sodium) in your body influences the effects of this medication, and this medication can increase salt loss from the body. Eat a normal diet that includes salt. Do not change to salt substitutes. Avoid changes involving diet,  or medications that include large amounts of sodium like sodium bicarbonate. Ask your care team for advice if you are not sure. Drink plenty of fluids while you are taking this medication. Avoid drinks that contain caffeine , such as coffee, tea and colas. You will need extra fluids if you have diarrhea or sweat a lot. This will help prevent toxic effects from this medication. Be careful not to get overheated during exercise, saunas, hot baths, and hot weather. Consult your care team if you have a high fever or persistent diarrhea. This medication may affect your coordination, reaction time, or judgment. Do not drive or operate machinery until you know how this medication affects you. Sit up or stand slowly to reduce the risk of dizzy or fainting spells. Drinking alcohol with this medication can increase the risk of these side effects. What side effects may I notice from receiving this medication? Side effects that you should report to your care team as soon as possible: Allergic reactions--skin rash, itching, hives, swelling of the face, lips, tongue, or throat Heart rhythm changes--fast or irregular heartbeat, dizziness, feeling faint or lightheaded, chest pain, trouble breathing Increased pressure around the brain--severe headache, blurry vision, change in vision, nausea, vomiting Increased thirst and amount of urine Irritability, confusion, fast or irregular heartbeat, muscle stiffness, twitching muscles, sweating, high fever, seizure, chills, vomiting, diarrhea, which may be signs of serotonin syndrome Lithium toxicity--diarrhea, vomiting, tremors, loss of balance or coordination, uncontrollable  eye movement, ringing of the ears, muscle weakness, twitching muscles, slurred speech, confusion Side effects that usually do not require medical attention (report to your care team if they continue or are bothersome): Dizziness Fatigue Nausea Tremors or shaking This list may not describe all possible  side effects. Call your doctor for medical advice about side effects. You may report side effects to FDA at 1-800-FDA-1088. Where should I keep my medication? Keep out of the reach of children. Store at room temperature between 15 and 30 degrees C (59 and 86 degrees F). Throw away any unused medication after the expiration date. NOTE: This sheet is a summary. It may not cover all possible information. If you have questions about this medicine, talk to your doctor, pharmacist, or health care provider.  2024 Elsevier/Gold Standard (2021-10-18 00:00:00)   www.[email or web address].org  www.psychologytoday  [email or web address] [facility] [address], [place], [place] [address]   [phone]  [facility] [email or web address] [address], [place], [place] [address]  [phone]  [facility] [email or web address] [address], [place], [place] [address]  [phone]   [facility] - [phone]  [facility] - [phone]  [facility] - [phone]   [facility] [phone]  [facility] - [phone]

## 2024-12-09 NOTE — Progress Notes (Unsigned)
 [facility] MD OP Progress Note  12/10/2024 9:26 AM Jamie [name]  MRN:  [id number]  Chief Complaint:  Chief Complaint  Patient presents with   Anxiety   Medication Refill   Follow-up   Depression    Discussed the use of AI scribe software for clinical note transcription with the patient, who gave verbal consent to proceed.  History of Present Illness Jamie [name] is a 58 year old Caucasian female, married, lives in [place], has a history of MDD, insomnia, chronic pain was evaluated at the office today for a follow-up appointment.  Ongoing low mood, decreased motivation, and persistent difficulty with concentration and focus continue to affect her. She describes feeling tired throughout the day and needing to push herself to complete daily tasks, such as getting ready in the morning. A lack of interest and energy leads her to talk herself through responsibilities, and she feels frustrated by her perceived inability to do more. Feelings of guilt and self-criticism arise, as she worries about letting her family down due to her physical limitations and mood symptoms. She works to improve her relationship with her husband and sets boundaries, but her symptoms continue to present challenges.  Stress and worry, particularly related to her family and her son's well-being, as well as her ability to fulfill her roles at home and work, contribute to her distress. Journaling at work helps her cope with feelings of anger, anxiety, and frustration. She reports that pain and physical limitations from her knee condition significantly impact her mood and daily functioning.  She finds her sleep generally adequate and does not feel tired upon waking, but fatigue increases throughout the day, especially when she takes hydroxyzine  for anxiety, which she tries to avoid due to its sedating effects. She confirms that she currently takes Rexulti  and amitriptyline . She states that she does not take any opioids and  manages her pain with ibuprofen.  When asked about suicidal thoughts, she describes feeling tired and occasionally experiencing negative thoughts when overwhelmed, but she reaches out for help and pushes herself to keep going for her family and job. She does not report any current suicidal ideation.  She denies any homicidality or perceptual disturbances.    Visit Diagnosis:    ICD-10-CM   1. Severe episode of recurrent major depressive disorder, without psychotic features (HCC)  F33.2     2. Insomnia due to medical condition  G47.01    Multifactorial including pain    3. GAD (generalized anxiety disorder)  F41.1       Past Psychiatric History: I have reviewed past psychiatric history from progress note on 03/08/2019.  Past trials of medications like venlafaxine , zolpidem , Seroquel , propranolol , Prozac , Cymbalta , doxepin , Celexa , carbamazepine , Abilify .  Past Medical History:  Past Medical History:  Diagnosis Date   Anxiety    Brachial neuritis    Cervical cancer (HCC)    Chronic neck pain    Chronic pain not due to malignancy    Depressive disorder    followed by Dr. John Clapacs.   Disturbance, sleep    DVT (deep venous thrombosis) (HCC)    left arm   Elevated blood pressure reading without diagnosis of hypertension    Embolism and thrombosis of splenic artery    H/O thrombophlebitis    Infarction of spleen    Lumbosacral neuritis    Obesity, Class II, BMI 35-39.9, with comorbidity     Past Surgical History:  Procedure Laterality Date   CESAREAN SECTION  09/24/2010   IVC FILTER  INSERTION N/A 05/24/2021   Procedure: IVC FILTER INSERTION;  Surgeon: Marea Selinda RAMAN, MD;  Location: [facility] INVASIVE CV [facility];  Service: Cardiovascular;  Laterality: N/A;   IVC FILTER REMOVAL N/A 07/18/2021   Procedure: IVC FILTER REMOVAL;  Surgeon: Marea Selinda RAMAN, MD;  Location: [facility] INV[facility] CV [facility];  Service: Cardiovascular;  Laterality: N/A;   TONSILLECTOMY AND ADENOIDECTOMY  09/24/2010   TOTAL HIP  ARTHROPLASTY Right 05/29/2021   Procedure: TOTAL HIP ARTHROPLASTY ANTERIOR APPROACH;  Surgeon: Kathlynn Sharper, MD;  Location: [facility] ORS;  Service: Orthopedics;  Laterality: Right;   TUBAL LIGATION  09/24/2010    Family Psychiatric History: I have reviewed family psychiatric history from progress note on 03/08/2019.  Family History:  Family History  Problem Relation Age of Onset   Anxiety disorder Mother    Depression Mother    Alcohol abuse Brother    Depression Daughter     Social History: I have reviewed social history from progress note on 03/08/2019. Social History   Socioeconomic History   Marital status: Married    Spouse name: rodney   Number of children: 3   Years of education: Not on file   Highest education level: Some college, no degree  Occupational History    Comment: full time  Tobacco Use   Smoking status: Never   Smokeless tobacco: Never  Vaping Use   Vaping status: Never Used  Substance and Sexual Activity   Alcohol use: Yes    Alcohol/week: 0.0 standard drinks of alcohol    Comment: social   Drug use: Not Currently    Types: Marijuana   Sexual activity: Yes    Partners: Male    Birth control/protection: Surgical    Comment: Tubal ligation   Other Topics Concern   Not on file  Social History Narrative   Not on file   Social Drivers of Health   Tobacco Use: Low Risk (12/09/2024)   Patient History    Smoking Tobacco Use: Never    Smokeless Tobacco Use: Never    Passive Exposure: Not on file  Financial Resource Strain: Low Risk  (09/29/2024)   Received from [facility]   Overall Financial Resource Strain (CARDIA)    Difficulty of Paying Living Expenses: Not hard at all  Food Insecurity: Food Insecurity Present (09/29/2024)   Received from [facility]   Epic    Within the past 12 months, you worried that your food would run out before you got the money to buy more.: Never true    Within the past 12 months, the food  you bought just didn't last and you didn't have money to get more.: Sometimes true  Transportation Needs: No Transportation Needs (09/29/2024)   Received from [facility] - Transportation    In the past 12 months, has lack of transportation kept you from medical appointments or from getting medications?: No    Lack of Transportation (Non-Medical): No  Physical Activity: Not on file  Stress: Not on file  Social Connections: Not on file  Depression (EYV7-0): High Risk (12/09/2024)   Depression (PHQ2-9)    PHQ-2 Score: 14  Alcohol Screen: Not on file  Housing: High Risk (09/29/2024)   Received from [facility]   Epic    In the last 12 months, was there a time when you were not able to pay the mortgage or rent on time?: Yes    In the past 12 months, how many  times have you moved where you were living?: 0    At any time in the past 12 months, were you homeless or living in a shelter (including now)?: No  Utilities: At Risk (09/29/2024)   Received from [facility]   Epic    In the past 12 months has the electric, gas, oil, or water company threatened to shut off services in your home?: Yes  Health Literacy: Not on file    Allergies: Allergies[1]  Metabolic Disorder Labs: Lab Results  Component Value Date   HGBA1C 5.1 12/09/2024   MPG 99.67 12/09/2024   MPG 114.02 10/31/2021   Lab Results  Component Value Date   PROLACTIN 11.0 12/09/2024   PROLACTIN 13.5 10/31/2021   No results found for: CHOL, TRIG, HDL, CHOLHDL, VLDL, LDLCALC No results found for: TSH  Therapeutic Level Labs: No results found for: LITHIUM No results found for: VALPROATE Lab Results  Component Value Date   CBMZ 5.4 11/01/2022    Current Medications: Current Outpatient Medications  Medication Sig Dispense Refill   celecoxib (CELEBREX) 100 MG capsule Take 100 mg by mouth 2 (two) times daily.     acetaminophen  (TYLENOL ) 325 MG  tablet Take 2 tablets (650 mg total) by mouth every 6 (six) hours as needed for mild pain (pain score 1-3), headache or fever.     amitriptyline  (ELAVIL ) 100 MG tablet Take 1 tablet (100 mg total) by mouth at bedtime. 30 tablet 3   Brexpiprazole  (REXULTI ) 4 MG TABS TAKE 1 TABLET BY MOUTH IN THE MORNING 30 tablet 2   butalbital -acetaminophen -caffeine  (FIORICET ) 50-325-40 MG tablet Take 1 tablet by mouth every 6 (six) hours as needed (headache refractory to tylenol ). 14 tablet 0   hydrOXYzine  (VISTARIL ) 25 MG capsule TAKE 1 CAPSULE BY MOUTH THREE TIMES DAILY AS NEEDED FOR ANXIETY AND FOR SLEEP 90 capsule 3   naloxone (NARCAN) nasal spray 4 mg/0.1 mL as needed.     ondansetron  (ZOFRAN ) 4 MG tablet Take 1 tablet (4 mg total) by mouth every 6 (six) hours as needed for nausea. (Patient not taking: Reported on 12/12/2023) 20 tablet 0   polyethylene glycol (MIRALAX  / GLYCOLAX ) 17 g packet Take 17 g by mouth daily. 30 each 0   RIVAROXABAN  (XARELTO ) VTE STARTER PACK (15 & 20 MG) Follow package directions: Take one 15mg  tablet by mouth twice a day. On day 22, switch to one 20mg  tablet once a day. Take with food. 51 each 0   No current facility-administered medications for this visit.     Musculoskeletal: Strength & Muscle Tone: within normal limits Gait & Station: normal Patient leans: Front  Psychiatric Specialty Exam: Review of Systems  Psychiatric/Behavioral:  Positive for decreased concentration and dysphoric mood. The patient is nervous/anxious.     Blood pressure 126/85, pulse 86, temperature 97.7 F (36.5 C), height 5' 2 (1.575 m), weight 174 lb 9.6 oz (79.2 kg), last menstrual period 02/18/2018.Body mass index is 31.93 kg/m.  General Appearance: Casual  Eye Contact:  Good  Speech:  Clear and Coherent  Volume:  Normal  Mood:  Anxious and Depressed  Affect:  Tearful  Thought Process:  Goal Directed and Descriptions of Associations: Intact  Orientation:  Full (Time, Place, and Person)   Thought Content: Rumination   Suicidal Thoughts:  No  Homicidal Thoughts:  No  Memory:  Immediate;   Fair Recent;   Fair Remote;   Fair  Judgement:  Fair  Insight:  Fair  Psychomotor Activity:  Normal  Concentration:  Concentration: Fair and Attention Span: Fair  Recall:  [employer] of Knowledge: Fair  Language: Fair  Akathisia:  No  Handed:  Right  AIMS (if indicated): done  Assets:  Communication Skills Desire for Improvement Housing Social Support Transportation  ADL's:  Intact  Cognition: WNL  Sleep:  Fair   Screenings: Geneticist, Molecular Office Visit from 12/09/2024 in [facility] Office Visit from 11/20/2022 in [facility] Video Visit from 10/14/2022 in [facility] Office Visit from 09/11/2022 in [facility] Video Visit from 05/13/2022 in [facility]  AIMS Total Score 0 0 0 0 0   GAD-7    Flowsheet Row Office Visit from 12/09/2024 in [facility] Office Visit from 10/22/2023 in [facility] Office Visit from 11/20/2022 in [facility] Office Visit from 09/11/2022 in [facility] Video Visit from 05/13/2022 in [facility]  Total GAD-7 Score 18 14 4 9 3    PHQ2-9    Flowsheet Row Office Visit from 12/09/2024 in [facility] Office Visit from 10/22/2023 in [facility] Counselor from 04/02/2023 in [facility] Visit from 11/20/2022 in [facility] Video Visit from 10/24/2022 in [facility]  PHQ-2 Total Score 5 2 2 2 5   PHQ-9 Total Score 14 8 17 5 11    Flowsheet Row Office Visit from 12/09/2024 in [facility] Video Visit from 10/06/2024 in [facility] Video Visit from 09/03/2024 in [facility]  C-SSRS RISK CATEGORY Low Risk No Risk No Risk     Assessment and Plan: Jamie [name] is a 58 year old Caucasian female who presented for a follow-up appointment, discussed assessment and plan as noted below.  1. Severe episode of recurrent major depressive disorder, without psychotic features (HCC)-unstable Currently reports worsening mood symptoms complicated by significant pain and physical limitations. Will refer patient for Spravato nasal spray.  Ambulatory referral to [facility]. Also advised to establish care with therapist to start CBT, communicated with staff to schedule this patient with in-house therapist as well Continue Rexulti  4 mg daily Continue reduced dosage of Amitriptyline  100 mg at bedtime Discussed referral for PHP, patient declines.  2. Insomnia due to medical condition-improving Although does have pain currently reports sleep is overall good. Continue pain management Continue Hydroxyzine  25 mg 3 times a day as needed for anxiety and sleep Continue Amitriptyline  100 mg at bedtime  3. GAD (generalized anxiety disorder)-unstable Ongoing anxiety symptoms mostly complicated by current physical limitations and pain Continue Amitriptyline  as prescribed Continue Hydroxyzine  25 mg 3 times a day as needed Encouraged to establish care with therapist, provided resources.  Crisis plan discussed with patient.  Patient to call 39, go to the nearest emergency department with any worsening mood symptoms or suicidality.  Follow-up Follow-up in clinic in 3 to 4 weeks or sooner if needed.    Collaboration of Care: Collaboration  of Care: Referral or follow-up with counselor/therapist AEB encouraged to establish care with therapist, provided resources and Other patient referred to [facility] for Spravato treatment.  Patient/Guardian was advised Release of Information must be obtained prior to any  record release in order to collaborate their care with an outside provider. Patient/Guardian was advised if they have not already done so to contact the registration department to sign all necessary forms in order for us  to release information regarding their care.   Consent: Patient/Guardian gives verbal consent for treatment and assignment of benefits for services provided during this visit. Patient/Guardian expressed understanding and agreed to proceed.   This note was generated in part or whole with voice recognition software. Voice recognition is usually quite accurate but there are transcription errors that can and very often do occur. I apologize for any typographical errors that were not detected and corrected.    Jameica Couts, MD 12/10/2024, 9:26 AM     [1]  Allergies Allergen Reactions   Hydrocodone-Acetaminophen  Nausea Only   Neuromuscular Blocking Agents Other (See Comments)    Don't work

## 2024-12-10 ENCOUNTER — Ambulatory Visit: Payer: Self-pay | Admitting: Psychiatry

## 2024-12-10 LAB — PROLACTIN: Prolactin: 11 ng/mL (ref 3.6–25.2)

## 2024-12-25 ENCOUNTER — Other Ambulatory Visit: Payer: Self-pay | Admitting: Psychiatry

## 2024-12-25 DIAGNOSIS — F331 Major depressive disorder, recurrent, moderate: Secondary | ICD-10-CM

## 2024-12-25 DIAGNOSIS — G4701 Insomnia due to medical condition: Secondary | ICD-10-CM

## 2024-12-25 DIAGNOSIS — F411 Generalized anxiety disorder: Secondary | ICD-10-CM

## 2024-12-28 ENCOUNTER — Telehealth: Payer: Self-pay | Admitting: Oncology

## 2024-12-28 NOTE — Telephone Encounter (Signed)
 Pt called and left a vm wanting to schedule an appt with Dr. Babara. Please advise.

## 2025-01-10 ENCOUNTER — Ambulatory Visit (INDEPENDENT_AMBULATORY_CARE_PROVIDER_SITE_OTHER)

## 2025-01-10 DIAGNOSIS — Z91199 Patient's noncompliance with other medical treatment and regimen due to unspecified reason: Secondary | ICD-10-CM

## 2025-01-10 NOTE — Progress Notes (Signed)
 Clinician attempted session in session but Jamie [name] did not appear for the session. Per [facility] policy, Pt. will be charged a no-show fee.

## 2025-01-26 ENCOUNTER — Other Ambulatory Visit: Payer: Self-pay | Admitting: Psychiatry

## 2025-01-26 DIAGNOSIS — F331 Major depressive disorder, recurrent, moderate: Secondary | ICD-10-CM

## 2025-01-26 DIAGNOSIS — F411 Generalized anxiety disorder: Secondary | ICD-10-CM

## 2025-01-29 ENCOUNTER — Other Ambulatory Visit: Payer: Self-pay | Admitting: Psychiatry

## 2025-01-29 DIAGNOSIS — G4701 Insomnia due to medical condition: Secondary | ICD-10-CM

## 2025-01-29 DIAGNOSIS — F331 Major depressive disorder, recurrent, moderate: Secondary | ICD-10-CM

## 2025-01-29 DIAGNOSIS — F411 Generalized anxiety disorder: Secondary | ICD-10-CM

## 2025-02-08 ENCOUNTER — Ambulatory Visit

## 2025-02-14 ENCOUNTER — Inpatient Hospital Stay

## 2025-02-14 ENCOUNTER — Inpatient Hospital Stay: Admitting: Oncology

## 2025-02-15 ENCOUNTER — Ambulatory Visit: Admitting: Psychiatry

## 2025-03-15 ENCOUNTER — Ambulatory Visit
